# Patient Record
Sex: Female | Born: 1960 | Race: White | Hispanic: No | Marital: Married | Smoking: Smoker, current status unknown
Health system: Southern US, Community
[De-identification: ages and names within clinical notes are randomized; demographics above are authoritative.]

## PROBLEM LIST (undated history)

## (undated) ENCOUNTER — Encounter (HOSPITAL_COMMUNITY)
Admission: EM | Disposition: A | Discharge status: Skilled Nursing Facility | Payer: Self-pay | Source: Ambulatory Visit | Attending: Anesthesiology

## (undated) DIAGNOSIS — Z8614 Personal history of Methicillin resistant Staphylococcus aureus infection: Secondary | ICD-10-CM

## (undated) DIAGNOSIS — R0600 Dyspnea, unspecified: Secondary | ICD-10-CM

## (undated) DIAGNOSIS — M129 Arthropathy, unspecified: Secondary | ICD-10-CM

## (undated) DIAGNOSIS — K219 Gastro-esophageal reflux disease without esophagitis: Secondary | ICD-10-CM

## (undated) DIAGNOSIS — R002 Palpitations: Secondary | ICD-10-CM

## (undated) DIAGNOSIS — R0602 Shortness of breath: Secondary | ICD-10-CM

## (undated) DIAGNOSIS — J9690 Respiratory failure, unspecified, unspecified whether with hypoxia or hypercapnia: Secondary | ICD-10-CM

## (undated) DIAGNOSIS — F419 Anxiety disorder, unspecified: Secondary | ICD-10-CM

## (undated) DIAGNOSIS — Z973 Presence of spectacles and contact lenses: Secondary | ICD-10-CM

## (undated) DIAGNOSIS — I10 Essential (primary) hypertension: Secondary | ICD-10-CM

## (undated) DIAGNOSIS — C801 Malignant (primary) neoplasm, unspecified: Secondary | ICD-10-CM

## (undated) DIAGNOSIS — R0609 Other forms of dyspnea: Secondary | ICD-10-CM

## (undated) DIAGNOSIS — R011 Cardiac murmur, unspecified: Secondary | ICD-10-CM

## (undated) DIAGNOSIS — E785 Hyperlipidemia, unspecified: Secondary | ICD-10-CM

## (undated) DIAGNOSIS — M6281 Muscle weakness (generalized): Secondary | ICD-10-CM

## (undated) DIAGNOSIS — N390 Urinary tract infection, site not specified: Secondary | ICD-10-CM

## (undated) DIAGNOSIS — B019 Varicella without complication: Secondary | ICD-10-CM

## (undated) DIAGNOSIS — G43909 Migraine, unspecified, not intractable, without status migrainosus: Secondary | ICD-10-CM

## (undated) DIAGNOSIS — J309 Allergic rhinitis, unspecified: Secondary | ICD-10-CM

## (undated) HISTORY — DX: Allergic rhinitis, unspecified: J30.9

## (undated) HISTORY — DX: Varicella without complication: B01.9

## (undated) HISTORY — DX: Gastro-esophageal reflux disease without esophagitis: K21.9

## (undated) HISTORY — DX: Urinary tract infection, site not specified: N39.0

## (undated) HISTORY — DX: Essential (primary) hypertension: I10

## (undated) HISTORY — DX: Migraine, unspecified, not intractable, without status migrainosus: G43.909

## (undated) HISTORY — PX: LOWER LEG SOFT TISSUE TUMOR EXCISION: SUR553

## (undated) HISTORY — PX: HX BREAST AUGMENTATION: SHX7

## (undated) HISTORY — PX: HX LAP CHOLECYSTECTOMY: SHX56

## (undated) HISTORY — PX: HX APPENDECTOMY: SHX54

## (undated) HISTORY — PX: HX OTHER: 2100001105

## (undated) HISTORY — PX: TRACHEOSTOMY: SUR1362

## (undated) HISTORY — PX: HX TUBAL LIGATION: SHX77

## (undated) SURGERY — IR SPINAL PUNCTURE LUMBAR DIAGNOSTIC SAMPLING

---

## 1979-07-25 HISTORY — PX: APPENDECTOMY: SHX54

## 1982-07-24 HISTORY — PX: CHOLECYSTECTOMY: SHX55

## 1988-07-24 HISTORY — PX: EXPLORATORY LAPAROTOMY: SUR591

## 1995-07-25 HISTORY — PX: TUBAL LIGATION: SHX77

## 1997-07-24 HISTORY — PX: LUMBAR DISC SURGERY: SHX700

## 1997-07-24 HISTORY — PX: HX LUMBAR DISKECTOMY: SHX99

## 1997-10-28 ENCOUNTER — Ambulatory Visit (HOSPITAL_COMMUNITY): Admission: RE | Admit: 1997-10-28 | Discharge: 1997-10-28 | Payer: Self-pay | Admitting: Neurological Surgery

## 1997-11-09 ENCOUNTER — Ambulatory Visit (HOSPITAL_COMMUNITY): Admission: RE | Admit: 1997-11-09 | Discharge: 1997-11-09 | Payer: Self-pay | Admitting: Neurological Surgery

## 1997-11-23 ENCOUNTER — Ambulatory Visit (HOSPITAL_COMMUNITY): Admission: RE | Admit: 1997-11-23 | Discharge: 1997-11-23 | Payer: Self-pay | Admitting: Neurological Surgery

## 1997-12-27 ENCOUNTER — Ambulatory Visit (HOSPITAL_COMMUNITY): Admission: RE | Admit: 1997-12-27 | Discharge: 1997-12-27 | Payer: Self-pay | Admitting: Neurological Surgery

## 1998-01-12 ENCOUNTER — Ambulatory Visit (HOSPITAL_COMMUNITY): Admission: RE | Admit: 1998-01-12 | Discharge: 1998-01-13 | Payer: Self-pay | Admitting: Neurological Surgery

## 1998-03-30 ENCOUNTER — Encounter: Admission: RE | Admit: 1998-03-30 | Discharge: 1998-06-28 | Payer: Self-pay | Admitting: Neurological Surgery

## 1998-04-06 ENCOUNTER — Other Ambulatory Visit: Admission: RE | Admit: 1998-04-06 | Discharge: 1998-04-06 | Payer: Self-pay | Admitting: *Deleted

## 1998-04-29 ENCOUNTER — Other Ambulatory Visit: Admission: RE | Admit: 1998-04-29 | Discharge: 1998-04-29 | Payer: Self-pay | Admitting: *Deleted

## 1998-11-03 ENCOUNTER — Other Ambulatory Visit: Admission: RE | Admit: 1998-11-03 | Discharge: 1998-11-03 | Payer: Self-pay | Admitting: *Deleted

## 1999-08-17 ENCOUNTER — Other Ambulatory Visit: Admission: RE | Admit: 1999-08-17 | Discharge: 1999-08-17 | Payer: Self-pay | Admitting: *Deleted

## 1999-12-16 ENCOUNTER — Encounter: Admission: RE | Admit: 1999-12-16 | Discharge: 1999-12-16 | Payer: Self-pay | Admitting: *Deleted

## 1999-12-16 ENCOUNTER — Encounter: Payer: Self-pay | Admitting: *Deleted

## 2000-02-02 ENCOUNTER — Other Ambulatory Visit: Admission: RE | Admit: 2000-02-02 | Discharge: 2000-02-02 | Payer: Self-pay | Admitting: *Deleted

## 2000-10-04 ENCOUNTER — Other Ambulatory Visit: Admission: RE | Admit: 2000-10-04 | Discharge: 2000-10-04 | Payer: Self-pay | Admitting: *Deleted

## 2001-09-26 ENCOUNTER — Encounter: Payer: Self-pay | Admitting: *Deleted

## 2001-09-26 ENCOUNTER — Encounter: Admission: RE | Admit: 2001-09-26 | Discharge: 2001-09-26 | Payer: Self-pay | Admitting: *Deleted

## 2001-10-23 ENCOUNTER — Other Ambulatory Visit: Admission: RE | Admit: 2001-10-23 | Discharge: 2001-10-23 | Payer: Self-pay | Admitting: *Deleted

## 2003-10-09 ENCOUNTER — Other Ambulatory Visit: Admission: RE | Admit: 2003-10-09 | Discharge: 2003-10-09 | Payer: Self-pay | Admitting: Obstetrics and Gynecology

## 2003-10-21 ENCOUNTER — Encounter (INDEPENDENT_AMBULATORY_CARE_PROVIDER_SITE_OTHER): Payer: Self-pay | Admitting: Specialist

## 2003-10-21 ENCOUNTER — Ambulatory Visit (HOSPITAL_COMMUNITY): Admission: RE | Admit: 2003-10-21 | Discharge: 2003-10-21 | Payer: Self-pay | Admitting: *Deleted

## 2004-04-27 ENCOUNTER — Ambulatory Visit: Payer: Self-pay | Admitting: Pain Medicine

## 2004-05-05 ENCOUNTER — Ambulatory Visit: Payer: Self-pay | Admitting: Pain Medicine

## 2004-05-19 ENCOUNTER — Ambulatory Visit: Payer: Self-pay | Admitting: Physician Assistant

## 2004-06-14 ENCOUNTER — Ambulatory Visit: Payer: Self-pay | Admitting: Pain Medicine

## 2004-07-21 ENCOUNTER — Ambulatory Visit: Payer: Self-pay | Admitting: Physician Assistant

## 2004-09-14 ENCOUNTER — Ambulatory Visit: Payer: Self-pay | Admitting: Physician Assistant

## 2004-10-11 ENCOUNTER — Ambulatory Visit: Payer: Self-pay | Admitting: Physician Assistant

## 2004-11-09 ENCOUNTER — Ambulatory Visit: Payer: Self-pay | Admitting: Physician Assistant

## 2004-12-07 ENCOUNTER — Ambulatory Visit: Payer: Self-pay | Admitting: Physician Assistant

## 2005-01-02 ENCOUNTER — Ambulatory Visit: Payer: Self-pay | Admitting: Physician Assistant

## 2005-01-05 ENCOUNTER — Encounter: Admission: RE | Admit: 2005-01-05 | Discharge: 2005-01-05 | Payer: Self-pay | Admitting: Occupational Medicine

## 2005-02-01 ENCOUNTER — Ambulatory Visit: Payer: Self-pay | Admitting: Physician Assistant

## 2005-03-16 ENCOUNTER — Ambulatory Visit: Payer: Self-pay | Admitting: Physician Assistant

## 2005-03-31 ENCOUNTER — Ambulatory Visit: Payer: Self-pay | Admitting: Physician Assistant

## 2005-04-27 ENCOUNTER — Ambulatory Visit: Payer: Self-pay | Admitting: Physician Assistant

## 2005-05-29 ENCOUNTER — Ambulatory Visit: Payer: Self-pay | Admitting: Physician Assistant

## 2005-06-20 ENCOUNTER — Ambulatory Visit: Payer: Self-pay | Admitting: Physician Assistant

## 2005-06-27 ENCOUNTER — Ambulatory Visit: Payer: Self-pay | Admitting: Pain Medicine

## 2005-06-28 ENCOUNTER — Ambulatory Visit: Payer: Self-pay | Admitting: Pain Medicine

## 2005-07-26 ENCOUNTER — Ambulatory Visit: Payer: Self-pay | Admitting: Physician Assistant

## 2005-08-24 ENCOUNTER — Ambulatory Visit: Payer: Self-pay | Admitting: Physician Assistant

## 2005-09-26 ENCOUNTER — Ambulatory Visit: Payer: Self-pay | Admitting: Physician Assistant

## 2005-10-19 ENCOUNTER — Ambulatory Visit: Payer: Self-pay | Admitting: Physician Assistant

## 2006-01-31 ENCOUNTER — Ambulatory Visit: Payer: Self-pay | Admitting: Physician Assistant

## 2006-07-25 ENCOUNTER — Ambulatory Visit: Payer: Self-pay | Admitting: Pain Medicine

## 2006-10-17 ENCOUNTER — Ambulatory Visit: Payer: Self-pay | Admitting: Physician Assistant

## 2007-01-03 ENCOUNTER — Ambulatory Visit: Payer: Self-pay | Admitting: Family Medicine

## 2007-01-03 DIAGNOSIS — M545 Low back pain: Secondary | ICD-10-CM

## 2007-01-09 ENCOUNTER — Ambulatory Visit: Payer: Self-pay | Admitting: Physician Assistant

## 2007-05-29 ENCOUNTER — Ambulatory Visit: Payer: Self-pay | Admitting: Physician Assistant

## 2010-12-09 NOTE — H&P (Signed)
NAME:  Valerie Forbes, Valerie Forbes                       ACCOUNT NO.:  [id number]   MEDICAL RECORD NO.:  [id number]                   PATIENT TYPE:  AMB   LOCATION:  [facility]C                                  FACILITY:  [facility]   PHYSICIAN:  [place] B. Earlene Plater, M.D.               DATE OF BIRTH:  13-Nov-1960   DATE OF ADMISSION:  10/21/2003  DATE OF DISCHARGE:                                HISTORY & PHYSICAL   PREOPERATIVE DIAGNOSES:  1. Abdominal bleeding.  2. Endometrial polyp.   INTENDED PROCEDURES:  1. Hysteroscopy.  2. Dilatation and curettage.  3. Polyp removal.   HISTORY OF PRESENT ILLNESS:  This is a 50 year old white female, gravida 4,  para 4, with a history of irregular bleeding, at times daily.  Pelvic  ultrasound and subsequent saline infusion ultrasound suggestive of  endometrial polyp.  Presents for surgical diagnosis and treatment.   PAST MEDICAL HISTORY:  Hypertension.   PAST SURGICAL HISTORY:  1. Breast implants.  2. Tubal ligation.   MEDICATIONS:  Lexapro, Norvasc, Klonopin, Robaxin, Neurontin, and Vicodin.   ALLERGIES:  CODEINE.   SOCIAL HISTORY:  The patient does smoke.  No alcohol or other drugs.   FAMILY HISTORY:  Noncontributory.   REVIEW OF SYSTEMS:  Otherwise negative.   PHYSICAL EXAMINATION:  VITAL SIGNS:  Blood pressure 150/100, pulse 76.  WEIGHT:  137 pounds.  GENERAL APPEARANCE:  Alert and oriented.  In no acute distress.  SKIN:  Warm and dry.  No lesions.  HEART:  Regular rate and rhythm.  LUNGS:  Clear to auscultation.  PELVIC:  Normal external genitalia.  Vagina and cervix normal.  The uterus  is normal size and anteverted.   ASSESSMENT:  Irregular bleeding with sonohysterogram consistent with  endometrial polyp.   PLAN:  1. Hysteroscopy.  2. Dilatation and curettage.  3. Polyp removal.   The operative risks were discussed, including infection, bleeding, uterine  perforation, damage to surrounding organs, and fluid overload.  All  questions were  answered.  The patient wished to proceed.                                               Gerri Spore B. Earlene Plater, M.D.    WBD/MEDQ  D:  10/20/2003  T:  10/20/2003  Job:  [id number]

## 2010-12-09 NOTE — Op Note (Signed)
NAME:  Valerie Forbes, [facility]UHN                       ACCOUNT NO.:  [id number]   MEDICAL RECORD NO.:  [id number]                   PATIENT TYPE:  AMB   LOCATION:  [facility]C                                  FACILITY:  [facility]   PHYSICIAN:  [place] B. Earlene Plater, M.D.               DATE OF BIRTH:  July 18, 1961   DATE OF PROCEDURE:  10/21/2003  DATE OF DISCHARGE:                                 OPERATIVE REPORT   PREOPERATIVE DIAGNOSIS:  Abnormal uterine bleeding and endometrial polyp.   POSTOPERATIVE DIAGNOSIS:  Abnormal uterine bleeding and endometrial polyp.   PROCEDURE:  Hysteroscopy, dilatation and curettage, polyp removal.   SURGEON:  [place] B. Earlene Plater, M.D.   ANESTHESIA:  LMA.   FINDINGS:  Endometrial polyp arises from the anterior wall of the uterus and  shaggy endometrium detected around the left cornu.  Normal-appearing tubal  ostia.   ESTIMATED BLOOD LOSS:  50 mL.   FLUID DEFICIT:  10 mL sorbitol.   INDICATIONS:  Patient with the above issues, for surgical diagnosis and  management.   PROCEDURE:  The patient was taken to the operating room and LMA anesthesia  obtained.  She was placed in the [place] stirrups, prepped and draped in a  standard fashion.  Bladder emptied with the red rubber catheter.  Exam under  anesthesia showed an anteverted, normal-size uterus and no adnexal masses.   Speculum inserted and cervix dilated to a #21 without difficulty.  The  hysteroscope was inserted after being flushed with sorbitol.  With good  uterine distention, the above findings were noted.   The polyp was removed with Randall stone forceps.  The endometrium was  gently curetted until a gritty texture noted throughout.  The hysteroscope  was replaced and no other abnormalities noted.  Therefore, the procedure was  terminated.   The patient tolerated the procedure well and had no complications.  She was  taken to the recovery room awake, alert, in stable condition.              Gerri Spore B. Earlene Plater, M.D.    WBD/MEDQ  D:  10/21/2003  T:  10/21/2003  Job:  [id number]

## 2015-05-14 ENCOUNTER — Ambulatory Visit (INDEPENDENT_AMBULATORY_CARE_PROVIDER_SITE_OTHER): Payer: PRIVATE HEALTH INSURANCE | Admitting: Family Medicine

## 2015-05-14 ENCOUNTER — Encounter: Payer: Self-pay | Admitting: Family Medicine

## 2015-05-14 ENCOUNTER — Telehealth: Payer: Self-pay | Admitting: Family Medicine

## 2015-05-14 VITALS — BP 128/88 | HR 75 | Temp 98.3°F | Ht 66.34 in | Wt 163.8 lb

## 2015-05-14 DIAGNOSIS — R0602 Shortness of breath: Secondary | ICD-10-CM | POA: Insufficient documentation

## 2015-05-14 DIAGNOSIS — I10 Essential (primary) hypertension: Secondary | ICD-10-CM

## 2015-05-14 DIAGNOSIS — M79642 Pain in left hand: Secondary | ICD-10-CM

## 2015-05-14 DIAGNOSIS — R079 Chest pain, unspecified: Secondary | ICD-10-CM

## 2015-05-14 DIAGNOSIS — M79641 Pain in right hand: Secondary | ICD-10-CM

## 2015-05-14 LAB — COMPREHENSIVE METABOLIC PANEL
ALBUMIN: 4 g/dL (ref 3.5–5.2)
ALT: 8 U/L (ref 0–35)
AST: 14 U/L (ref 0–37)
Alkaline Phosphatase: 60 U/L (ref 39–117)
BILIRUBIN TOTAL: 0.3 mg/dL (ref 0.2–1.2)
BUN: 17 mg/dL (ref 6–23)
CALCIUM: 9.6 mg/dL (ref 8.4–10.5)
CO2: 29 mEq/L (ref 19–32)
CREATININE: 0.67 mg/dL (ref 0.40–1.20)
Chloride: 106 mEq/L (ref 96–112)
GFR: 96.71 mL/min (ref >60.00)
Glucose, Bld: 91 mg/dL (ref 70–99)
Potassium: 4.2 mEq/L (ref 3.5–5.1)
Sodium: 139 mEq/L (ref 135–145)
Total Protein: 6.8 g/dL (ref 6.0–8.3)

## 2015-05-14 LAB — CBC
HCT: 44.1 % (ref 36.0–46.0)
Hemoglobin: 14.8 g/dL (ref 12.0–15.0)
MCHC: 33.5 g/dL (ref 30.0–36.0)
MCV: 89 fl (ref 78.0–100.0)
Platelets: 260 10*3/uL (ref 150.0–400.0)
RBC: 4.95 Mil/uL (ref 3.87–5.11)
RDW: 13.9 % (ref 11.5–15.5)
WBC: 9.3 10*3/uL (ref 4.0–10.5)

## 2015-05-14 LAB — TROPONIN I: TNIDX: 0.01 ug/L (ref 0.00–0.06)

## 2015-05-14 LAB — TSH: TSH: 0.31 u[IU]/mL — AB (ref 0.35–4.50)

## 2015-05-14 LAB — HEMOGLOBIN A1C: HEMOGLOBIN A1C: 5.2 % (ref 4.6–6.5)

## 2015-05-14 MED ORDER — TRAMADOL HCL 50 MG PO TABS: 50.0000 mg | ORAL_TABLET | Freq: Two times a day (BID) | ORAL | Status: DC | PRN

## 2015-05-14 MED ORDER — METOPROLOL TARTRATE 25 MG PO TABS: 25.0000 mg | ORAL_TABLET | Freq: Two times a day (BID) | ORAL | Status: DC

## 2015-05-14 MED ORDER — AMLODIPINE BESYLATE 5 MG PO TABS: 5.0000 mg | ORAL_TABLET | Freq: Every day | ORAL | Status: DC

## 2015-05-14 NOTE — Assessment & Plan Note (Addendum)
Patient with long history of bilateral hand pain in her finger joints. She has no apparent defects on exam today. She has been on tramadol for this for some time. This was previously prescribed by her prior PCP. She has recently been taking Aleve for this. Given that she is starting on a daily aspirin I would prefer she avoid NSAID use at this time. We will refill her tramadol for a 10 day supply and request records from her PCP. If this continues to be an issue in the future would consider imaging of her hands and/or arthritic workup.

## 2015-05-14 NOTE — Progress Notes (Signed)
Pre visit review using our clinic review tool, if applicable. No additional management support is needed unless otherwise documented below in the visit note. 

## 2015-05-14 NOTE — Assessment & Plan Note (Signed)
Patient is at goal today. We will refill her amlodipine and metoprolol. Given that she is at goal today and her blood pressure has been under improved control at home recently we will refrain from prescribing lisinopril and HCTZ at this time. She will continue to monitor her blood pressure and if it is greater than 150/90 she will let us know immediately. If she continues to run in the 140s systolic with these medications will need to add another medicine for her blood pressure. We'll check lab work today.

## 2015-05-14 NOTE — Patient Instructions (Signed)
Nice to meet you. I have advised that you go to the ED for evaluation. You have chosen to monitor this. If you develop further chest tightness or shortness of breath or fatigue please seek medical attention immediately.  We will check blood work to evaluate for a cause of your pain.  We have refilled your medications.  If you develop chest pain, shortness of breath, fatigue, light headedness, palpitations, fever, feel poorly, or have any change in symptoms or new symptoms please seek medical attention.

## 2015-05-14 NOTE — Progress Notes (Addendum)
Patient ID: Valerie Forbes, female   DOB: [birth year], 54 y.o.   MRN: [id number]  Valerie Rumps, MD Phone: [phone]  Valerie Forbes is a 54 y.o. female who presents today for new patient visit.  HYPERTENSION Disease Monitoring Home BP Monitoring checking at home, recently has been 140-146/82-102 Chest pain- yes, see below    Dyspnea- yes, see below Medications Compliance-  taking metoprolol 25 mg twice a day and amlodipine 5 mg twice a day. Lightheadedness-  no  Edema- no  Exertional chest pain and fatigue: Patient notes for the last 1.5 weeks she has had fatigue on exertion. She notes mild central chest tightness with this. She notes she has gotten winded going up stairs. She denies shortness of breath or chest discomfort at rest. She's not had any diaphoresis or radiation of the pain. She is not wheezing during this time either. She does note decreased stamina. She notes she has been working longer hours than usual. She works 8 AM to 11 PM. She denies depression, SI, and HI. She does not have a history of any thyroid issues. She denies bleeding from anywhere. Her last colonoscopy was in the early 1990s. Her last Pap smear was around the year 2007. She does note infrequent palpitations with this that she describes as her heart racing. She notes she checks it at home and it's near 100 prior to taking the metoprolol. Will come down into the 60s after she takes metoprolol. She does not typically have chest pain or shortness of breath with the palpitations. She notes prior to the last week and a half she felt well. He is unsure if this fatigue is related to her working longer hours. She's not had any fevers or unexplained weight loss. She has no URI symptoms. She does not have any current or recent headaches. She has been more stressed out with her increased work hours. He does note some weight gain. She notes her last episode of discomfort was today around 11:30 to 12:00 when she is moving  a TV with her daughter. She has no chest pain or shortness of breath at this time.  Hand pain: Patient notes pain in bilateral hands in the PIP and DIP joints for years. She notes it is slowly worsened. They intermittently swell. She does not remember having imaging or lab workup for arthritis. She notes they do not bother her very much at this time though they have in the past several weeks. Note she has taken Aleve since she ran out of her tramadol. Her PCP was prescribing her tramadol previously.   Active Ambulatory Problems    Diagnosis Date Noted  . Exertional chest pain 05/14/2015  . Essential hypertension 05/14/2015  . Bilateral hand pain 05/14/2015   Resolved Ambulatory Problems    Diagnosis Date Noted  . LOW BACK PAIN, ACUTE 01/03/2007   Past Medical History  Diagnosis Date  . Chickenpox   . GERD (gastroesophageal reflux disease)   . Allergic rhinitis   . Hypertension   . Migraines   . UTI (lower urinary tract infection)     Family History  Problem Relation Age of Onset  . Arthritis      Parent   . Hyperlipidemia      Parent  . Heart disease      Parent  . Hypertension      Parent  . Non-Hodgkin's lymphoma      Parent    Social History   Social History  . Marital Status:  Married    Spouse Name: N/A  . Number of Children: N/A  . Years of Education: N/A   Occupational History  . Not on file.   Social History Main Topics  . Smoking status: Smoker, Current Status Unknown  . Smokeless tobacco: Not on file  . Alcohol Use: Not on file  . Drug Use: Not on file  . Sexual Activity: Not on file   Other Topics Concern  . Not on file   Social History Narrative  . No narrative on file    ROS   General:  Negative for unexplained weight loss, fever Skin: Negative for new or changing mole, sore that won't heal HEENT: Negative for trouble hearing, trouble seeing, ringing in ears, mouth sores, hoarseness, change in voice, dysphagia. CV:  Negative for chest  pain, dyspnea, edema, positive for palpitations Resp: Negative for cough, dyspnea, hemoptysis GI: Positive for chronic diarrhea, Negative for nausea, vomiting, constipation, abdominal pain, melena, hematochezia. GU: Negative for dysuria, incontinence, urinary hesitance, hematuria, vaginal or penile discharge, polyuria, sexual difficulty, lumps in testicle or breasts MSK: Positive for joint pain in hands, Negative for muscle cramps or aches, joint swelling Neuro: Positive for headaches, Negative for weakness, numbness, dizziness, passing out/fainting Psych: Negative for depression, anxiety, memory problems  Objective  Physical Exam Filed Vitals:   05/14/15 1311  BP: 128/88  Pulse: 75  Temp: 98.3 F (36.8 C)    Physical Exam  Constitutional: She is well-developed, well-nourished, and in no distress.  HENT:  Head: Normocephalic and atraumatic.  Right Ear: External ear normal.  Left Ear: External ear normal.  Mouth/Throat: Oropharynx is clear and moist.  Eyes: Conjunctivae are normal. Pupils are equal, round, and reactive to light.  Neck: Neck supple.  Cardiovascular: Normal rate and regular rhythm.  Exam reveals no gallop and no friction rub.   Murmur (2/6 systolic murmur) heard. Pulmonary/Chest: Effort normal and breath sounds normal. No respiratory distress. She has no wheezes. She has no rales.  Abdominal: Soft. Bowel sounds are normal. She exhibits no distension. There is no tenderness. There is no rebound and no guarding.  Musculoskeletal: She exhibits no edema.  Bilateral hands with no swelling, joint tenderness, erythema, both hands are warm and well perfused with good capillary refill, 2+ radial pulses, full range of motion all 10 fingers  Lymphadenopathy:    She has no cervical adenopathy.  Neurological: She is alert.  CN 2-12 intact, 5/5 strength in bilateral biceps, triceps, grip, quads, hamstrings, plantar and dorsiflexion, sensation to light touch intact in bilateral UE  and LE, normal gait, 2+ patellar reflexes  Skin: She is not diaphoretic.   EKG: Sinus rhythm with an occasional PAC, rate 66, flattened T-wave in lead II, inverted T waves in leads III, aVF, V5, and V6  Assessment/Plan:   Exertional chest pain Patient with what sounds like typical chest pain on exertion. She has no chest pain or shortness of breath at this time. Her EKG does show inferior and lateral inverted T waves. Her last episode of chest pain was today around 11:30 to 12:00. Given the recent episode of chest pain and her EKG findings I advised her that she should be evaluated in the emergency room at this time. She declined this opting to monitor this at home. Given that she refuses to go to the emergency room we will check a troponin in the office. We will get her scheduled with cardiology for evaluation. She will start on aspirin 81 mg daily. We will  check TSH, cmet, CBC, and A1c. Also noted murmur on exam and patient notes she has had a murmur previously, though does not remember the last time she was advised that this was heard. She will benefit from evaluation of this with cardiology. She signed AMA paperwork after discussion of going to the emergency room. She understands the risks of not being evaluated for this at this time. She was given return precautions and advised to seek medical attention immediately in the emergency room if she had recurrence of symptoms.  Essential hypertension Patient is at goal today. We will refill her amlodipine and metoprolol. Given that she is at goal today and her blood pressure has been under improved control at home recently we will refrain from prescribing lisinopril and HCTZ at this time. She will continue to monitor her blood pressure and if it is greater than 150/90 she will let us know immediately. If she continues to run in the 007M systolic with these medications will need to add another medicine for her blood pressure. We'll check lab work  today.  Bilateral hand pain Patient with long history of bilateral hand pain in her finger joints. She has no apparent defects on exam today. She has been on tramadol for this for some time. This was previously prescribed by her prior PCP. She has recently been taking Aleve for this. Given that she is starting on a daily aspirin I would prefer she avoid NSAID use at this time. We will refill her tramadol for a 10 day supply and request records from her PCP. If this continues to be an issue in the future would consider imaging of her hands and/or arthritic workup.    Orders Placed This Encounter  Procedures  . Comp Met (CMET)  . CBC  . TSH  . HgB A1c  . Troponin I  . Ambulatory referral to Cardiology    Referral Priority:  Urgent    Referral Type:  Consultation    Referral Reason:  Specialty Services Required    Requested Specialty:  Cardiology    Number of Visits Requested:  1  . EKG 12-Lead    Valerie Forbes

## 2015-05-14 NOTE — Telephone Encounter (Signed)
Attempted to call patient to inform her that her troponin was negative. There is no answer on her mobile phone and I left a message asking her to call back to the office when she got the message. Attempted to called the home phone number listed and was advised that I had the wrong number. We will await the patient's call back.

## 2015-05-14 NOTE — Telephone Encounter (Signed)
Notified patient of lab results.  Patient verbalized understanding.  

## 2015-05-14 NOTE — Assessment & Plan Note (Addendum)
Patient with what sounds like typical chest pain on exertion. She has no chest pain or shortness of breath at this time. Her EKG does show inferior and lateral inverted T waves. Her last episode of chest pain was today around 11:30 to 12:00. Given the recent episode of chest pain and her EKG findings I advised her that she should be evaluated in the emergency room at this time. She declined this opting to monitor this at home. Given that she refuses to go to the emergency room we will check a troponin in the office. We will get her scheduled with cardiology for evaluation. She will start on aspirin 81 mg daily. We will check TSH, cmet, CBC, and A1c. Also noted murmur on exam and patient notes she has had a murmur previously, though does not remember the last time she was advised that this was heard. She will benefit from evaluation of this with cardiology. She signed AMA paperwork after discussion of going to the emergency room. She understands the risks of not being evaluated for this at this time. She was given return precautions and advised to seek medical attention immediately in the emergency room if she had recurrence of symptoms.

## 2015-05-19 ENCOUNTER — Telehealth: Payer: Self-pay | Admitting: *Deleted

## 2015-05-19 ENCOUNTER — Other Ambulatory Visit (INDEPENDENT_AMBULATORY_CARE_PROVIDER_SITE_OTHER): Payer: PRIVATE HEALTH INSURANCE

## 2015-05-19 DIAGNOSIS — R946 Abnormal results of thyroid function studies: Secondary | ICD-10-CM

## 2015-05-19 DIAGNOSIS — R7989 Other specified abnormal findings of blood chemistry: Secondary | ICD-10-CM

## 2015-05-19 LAB — T3, FREE: T3 FREE: 4.6 pg/mL — AB (ref 2.3–4.2)

## 2015-05-19 LAB — T4, FREE: Free T4: 0.98 ng/dL (ref 0.60–1.60)

## 2015-05-19 NOTE — Telephone Encounter (Signed)
I apologize for not placing these sooner, orders have been placed with the appropriate diagnosis code.

## 2015-05-19 NOTE — Telephone Encounter (Signed)
Labs and dx?  

## 2015-05-28 ENCOUNTER — Encounter: Payer: Self-pay | Admitting: Family Medicine

## 2015-05-28 ENCOUNTER — Ambulatory Visit (INDEPENDENT_AMBULATORY_CARE_PROVIDER_SITE_OTHER): Payer: No Typology Code available for payment source | Admitting: Family Medicine

## 2015-05-28 VITALS — BP 124/82 | HR 66 | Temp 98.0°F | Ht 66.43 in | Wt 165.6 lb

## 2015-05-28 DIAGNOSIS — M79642 Pain in left hand: Secondary | ICD-10-CM

## 2015-05-28 DIAGNOSIS — M79641 Pain in right hand: Secondary | ICD-10-CM

## 2015-05-28 DIAGNOSIS — R7989 Other specified abnormal findings of blood chemistry: Secondary | ICD-10-CM | POA: Insufficient documentation

## 2015-05-28 DIAGNOSIS — R079 Chest pain, unspecified: Secondary | ICD-10-CM

## 2015-05-28 DIAGNOSIS — M2550 Pain in unspecified joint: Secondary | ICD-10-CM | POA: Diagnosis not present

## 2015-05-28 DIAGNOSIS — I10 Essential (primary) hypertension: Secondary | ICD-10-CM | POA: Diagnosis not present

## 2015-05-28 DIAGNOSIS — R946 Abnormal results of thyroid function studies: Secondary | ICD-10-CM | POA: Diagnosis not present

## 2015-05-28 DIAGNOSIS — M542 Cervicalgia: Secondary | ICD-10-CM | POA: Insufficient documentation

## 2015-05-28 LAB — T3: T3 TOTAL: 119.4 ng/dL (ref 80.0–204.0)

## 2015-05-28 LAB — SEDIMENTATION RATE: SED RATE: 9 mm/h (ref 0–22)

## 2015-05-28 LAB — TSH: TSH: 0.61 u[IU]/mL (ref 0.35–4.50)

## 2015-05-28 LAB — RHEUMATOID FACTOR

## 2015-05-28 LAB — T4, FREE: FREE T4: 1.02 ng/dL (ref 0.60–1.60)

## 2015-05-28 MED ORDER — TRAMADOL HCL 50 MG PO TABS: 50.0000 mg | ORAL_TABLET | Freq: Two times a day (BID) | ORAL | Status: DC | PRN

## 2015-05-28 NOTE — Progress Notes (Signed)
Patient ID: RHYTHM GUBBELS, female   DOB: 1961/06/13, 54 y.o.   MRN: [id number]  [name], MD Phone: [phone]  Valerie Forbes is a 54 y.o. female who presents today for follow-up.  HYPERTENSION Disease Monitoring Home BP Monitoring is checking at home, states it's been in the 120s over 80s Chest pain- yes see below    Dyspnea- yes see below Medications Compliance-  taking metoprolol 25 mg twice daily and amlodipine 5 mg once daily.  Edema- no  Chest pain/shortness of breath, exertional: Patient notes continued intermittent chest discomfort described as indigestion that she gets when she walks. She also notes some exertional shortness of breath as well. She notes if she walks any decent distance she has these symptoms. This resolves with rest. She has no chest pain or shortness of breath right now. She's been taking aspirin. She has no diaphoresis with this. She does note she has seen cardiology and they're planning a stress echo and abdominal ultrasound to evaluate for AAA.  Low TSH: On screening lab work at her last visit that revealed a low TSH and a minimally high free T3. She is here to get repeat lab work on this. She does note intermittent palpitations. She does note heat intolerance. She tolerates cold weather better. She's not had any skin changes. She has gained some weight. No known thyroid issues in the past.  Left neck soreness: Patient notes intermittently for a number of years she has had left anterior neck soreness. She had one episode where this area became swollen and tender about a year ago. She had no other symptoms with this. She had no injury to her neck. She had no difficulty swallowing. She does note some intermittent soreness with swallowing though she is able to swallow easily. She's had no throat swelling, tongue swelling or, lip swelling with this. She has no mass right now.  Hand aches: Patient does note that her bilateral hands ache in the joints.  She notes a few little nodules on several of her DIP joints. She notes it's mostly in her DIP and PIP joints. She notes it especially worse in the morning and at night. It is to the point that it hurts for her to pick things up and hold them. She believes she has arthritis.   PMH: smoker.   ROS see history of present illness  Objective  Physical Exam Filed Vitals:   05/28/15 1431  BP: 124/82  Pulse: 66  Temp: 98 F (36.7 C)   Physical Exam  Constitutional: She is well-developed, well-nourished, and in no distress.  HENT:  Head: Normocephalic and atraumatic.  Mouth/Throat: Oropharynx is clear and moist. No oropharyngeal exudate.  No masses or erythema or exudate noted in the throat, tonsils are equal and normal in size  Neck: Neck supple. No thyromegaly present.  No mass noted in bilateral anterior neck, there is minimal soreness in the left anterior cervical lymph node chain though no palpable lymph nodes  Cardiovascular: Normal rate and regular rhythm.  Exam reveals no gallop and no friction rub.   Murmur (2/6 systolic murmur) heard. Pulmonary/Chest: Effort normal and breath sounds normal. No respiratory distress. She has no wheezes. She has no rales.  Musculoskeletal: She exhibits no edema.  Bilateral hands with scattered DIP joint nodules there is no joint swelling, there is no joint erythema, there is no joint tenderness  Lymphadenopathy:    She has no cervical adenopathy.  Neurological:  5/5 strength in bilateral biceps, triceps, grip,  quads, hamstrings, plantar and dorsiflexion, sensation to light touch intact in bilateral UE and LE, normal gait, 2+ patellar reflexes  Skin: Skin is warm and dry. She is not diaphoretic.     Assessment/Plan: Please see individual problem list.  Bilateral hand pain Long history of bilateral hand pain in her finger joints. Note there are what appear to be small joint nodules on the DIP joints. Discussed potential imaging versus lab workup  for arthritic component. Patient opted for lab workup at this time. We will order rheumatoid factor, ANA, and ESR. These are negative would consider x-rays of the hands. We'll refill her tramadol.  Essential hypertension At goal. Continue current medication regimen.  Exertional chest pain No active chest pain today. Has continued to have intermittent chest pain and has followed up with cardiology already with the plan for stress echo and abdominal ultrasound to evaluate for AAA. She'll continue aspirin daily. We'll continue to monitor this at this time and if she developed chest pain or shortness of breath or palpitations or diaphoresis she will seek medical attention. Given return precautions  Low TSH level Recheck labs today.  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 There is no mass in this area. This is been going on for a number of years. No swallowing difficulties. No throat swelling or mouth issues with this. Do not feel a defect on exam today. I did discuss imaging to evaluate this further, though the patient declined this at this time and opted to continue to monitor. We will continue to monitor this. She is given return precautions.    Orders Placed This Encounter  Procedures  . TSH  . T3  . T4, free  . Rheumatoid Factor  . Antinuclear Antib (ANA)  . Sed Rate (ESR)    Meds ordered this encounter  Medications  . traMADol (ULTRAM) 50 MG tablet    Sig: Take 1 tablet (50 mg total) by mouth every 12 (twelve) hours as needed for moderate pain.    Dispense:  60 tablet    Refill:  0    [name]

## 2015-05-28 NOTE — Assessment & Plan Note (Signed)
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 There is no mass in this area. This is been going on for a number of years. No swallowing difficulties. No throat swelling or mouth issues with this. Do not feel a defect on exam today. I did discuss imaging to evaluate this further, though the patient declined this at this time and opted to continue to monitor. We will continue to monitor this. She is given return precautions.

## 2015-05-28 NOTE — Assessment & Plan Note (Addendum)
Long history of bilateral hand pain in her finger joints. Note there are what appear to be small joint nodules on the DIP joints. Discussed potential imaging versus lab workup for arthritic component. Patient opted for lab workup at this time. We will order rheumatoid factor, ANA, and ESR. If These are negative would consider x-rays of the hands. We'll refill her tramadol.

## 2015-05-28 NOTE — Assessment & Plan Note (Signed)
No active chest pain today. Has continued to have intermittent chest pain and has followed up with cardiology already with the plan for stress echo and abdominal ultrasound to evaluate for AAA. She'll continue aspirin daily. We'll continue to monitor this at this time and if she developed chest pain or shortness of breath or palpitations or diaphoresis she will seek medical attention. Given return precautions

## 2015-05-28 NOTE — Progress Notes (Signed)
Pre visit review using our clinic review tool, if applicable. No additional management support is needed unless otherwise documented below in the visit note. 

## 2015-05-28 NOTE — Assessment & Plan Note (Signed)
Recheck labs today. 

## 2015-05-28 NOTE — Patient Instructions (Signed)
Nice to see you. We will check lab work and call with the results.  Please continue your blood pressure medications. If you develop chest pain, shortness of breath, sweating, or palpitations please seek medical attention.

## 2015-05-28 NOTE — Assessment & Plan Note (Signed)
At goal. Continue current medication regimen.  

## 2015-05-31 ENCOUNTER — Encounter: Payer: Self-pay | Admitting: Family Medicine

## 2015-05-31 LAB — ANA: ANA: NEGATIVE

## 2015-06-03 ENCOUNTER — Telehealth: Payer: Self-pay | Admitting: *Deleted

## 2015-06-03 NOTE — Telephone Encounter (Signed)
Patient has requested lab results. Patient requested a call back

## 2015-06-04 NOTE — Telephone Encounter (Signed)
Notified patient that lab work was normal and we had mailed the results to her.

## 2015-06-28 ENCOUNTER — Ambulatory Visit (INDEPENDENT_AMBULATORY_CARE_PROVIDER_SITE_OTHER): Payer: No Typology Code available for payment source | Admitting: Family Medicine

## 2015-06-28 ENCOUNTER — Encounter: Payer: Self-pay | Admitting: Family Medicine

## 2015-06-28 VITALS — BP 118/84 | HR 66 | Temp 98.2°F | Ht 66.43 in | Wt 168.4 lb

## 2015-06-28 DIAGNOSIS — K219 Gastro-esophageal reflux disease without esophagitis: Secondary | ICD-10-CM

## 2015-06-28 DIAGNOSIS — R0602 Shortness of breath: Secondary | ICD-10-CM | POA: Diagnosis not present

## 2015-06-28 DIAGNOSIS — R197 Diarrhea, unspecified: Secondary | ICD-10-CM

## 2015-06-28 DIAGNOSIS — M79641 Pain in right hand: Secondary | ICD-10-CM | POA: Diagnosis not present

## 2015-06-28 DIAGNOSIS — M79642 Pain in left hand: Secondary | ICD-10-CM

## 2015-06-28 MED ORDER — MELOXICAM 7.5 MG PO TABS: 7.5000 mg | ORAL_TABLET | Freq: Every day | ORAL | Status: AC

## 2015-06-28 MED ORDER — TRAMADOL HCL 50 MG PO TABS: 50.0000 mg | ORAL_TABLET | Freq: Two times a day (BID) | ORAL | Status: DC | PRN

## 2015-06-28 NOTE — Progress Notes (Signed)
Pre visit review using our clinic review tool, if applicable. No additional management support is needed unless otherwise documented below in the visit note. 

## 2015-06-28 NOTE — Patient Instructions (Signed)
Nice to see you. We will obtain x-rays of her hands to evaluate her joint pain. Please consider whether or not you want to proceed with referral to neurology versus MRI of her neck. We will refer you to GI for evaluation of her diarrhea and for colonoscopy. If you develop chest pain, shortness of breath, palpitations, abdominal pain, blood in your stool, or any new or changing symptoms please seek medical attention.

## 2015-06-29 ENCOUNTER — Encounter: Payer: Self-pay | Admitting: Gastroenterology

## 2015-06-29 DIAGNOSIS — K219 Gastro-esophageal reflux disease without esophagitis: Secondary | ICD-10-CM | POA: Insufficient documentation

## 2015-06-29 DIAGNOSIS — R197 Diarrhea, unspecified: Secondary | ICD-10-CM | POA: Insufficient documentation

## 2015-06-29 NOTE — Progress Notes (Signed)
Patient ID: Valerie Forbes, female   DOB: [birth year], 54 y.o.   MRN: [id number]  [name], MD Phone: [phone]  Valerie Forbes is a 54 y.o. female who presents today for follow-up.  Diarrhea: Patient notes she has had issues with intermittent diarrhea for most of her life. She notes she has a diagnosis of IBS. She states for the last several weeks she has had liquid stool with some urgency. She has not had any incontinence. She denies abdominal pain. She denies nausea and vomiting. She's not had any new foods, or traveled or had any new water sources. She does note occasionally there will be a reddish streak in the stool with the last being 2-3 days ago. She's not had any gross blood from her rectum. She had colonoscopy 23 years ago that did not reveal any abnormalities. She has been taking naproxen and Prilosec. She does have heartburn and the Prilosec helps with this significantly. She has not had heartburn since starting the Prilosec.  Exertional dyspnea: patient notes this is much improved. Only notices this minimally after she has gone up a whole flight of stairs. She notes she saw cardiology and had a normal stress echo. She's not had any chest pain with this.  Patient notes she has intermittent joint pain and foot, ankle, and hand swelling. She notes all the joints in her hands hurt. She notes her legs will hurt. She occasionally gets a sharp pain down the anterior aspect of her left leg. She notes decreased strength in her right hand, then says that she can't carry anything using her right arm because of strength issues. She notes rare tingling in her hand and a cold sensation in her hand. She has no neck pain with this. She no other numbness or weakness.  PMH: Smoker.   ROS see history of present illness  Objective  Physical Exam Filed Vitals:   06/28/15 1407  BP: 118/84  Pulse: 66  Temp: 98.2 F (36.8 C)   Physical Exam  Constitutional: She is  well-developed, well-nourished, and in no distress.  HENT:  Head: Normocephalic.  Right Ear: External ear normal.  Left Ear: External ear normal.  Mouth/Throat: Oropharynx is clear and moist. No oropharyngeal exudate.  Eyes: Conjunctivae are normal. Pupils are equal, round, and reactive to light.  Cardiovascular: Normal rate and regular rhythm.   Pulmonary/Chest: Effort normal and breath sounds normal.  Abdominal: Soft. Bowel sounds are normal. She exhibits no distension. There is no tenderness. There is no rebound and no guarding.  Musculoskeletal: She exhibits no edema.  No joint swelling in the hands, knees, or wrists, there is no tenderness of the knees, there is no ligamentous laxity of the knees, negative McMurray's in the knees, there is no tenderness of the hands or wrists, she has full range of motion in her hands  Neurological: She is alert.  5/5 strength in bilateral biceps, triceps, grip, quads, hamstrings, plantar and dorsiflexion, sensation to light touch intact in bilateral UE and LE, normal gait, 2+ patellar reflexes  Skin: Skin is warm and dry. She is not diaphoretic.   negative Phalen's and Tinel's   Assessment/Plan: Please see individual problem list.  Bilateral hand pain This continues to be an issue. Her prior lab workup was negative. She does note reported right upper extremity weakness, though on exam strength appears normal. She does have occasional tingling in her right hand. She is neurologically intact today. Question is, is this a bony issue or is she  having a nerve issue specifically in her right hand and arm. I do think she would benefit from x-rays of her bilateral hands given the degree of discomfort she has in her joints. I also discussed obtaining MRI of her neck versus referral to neurology or sports medicine for EMG testing to determine if there is a nerve issue or nerve compression leading to her sensation of right arm and hand weakness. She is unsure if she  wants to do this at this time. We will proceed with the x-rays and the patient will contemplate whether or not she wants an MRI or referral. We will refill her tramadol. We will change her naproxen to meloxicam. She is advised take this with food as she has not been doing this to this point. She is given return precautions.  Exertional shortness of breath Today patient states she never had chest pain. Her shortness of breath has improved. She had a negative stress echo. She has no chest pain or shortness of breath at this time. Suspect this is related to deconditioning. Given negative cardiac workup we will continue to monitor this and if it persists or worsens she'll seek medical attention. She is given return precautions.  Diarrhea This is a persistent chronic intermittent issue for the patient. Likely related to IBS. She has noted a minimal amount of reddish streaks in her stool recently. I discussed doing a rectal exam today to evaluate this, though she declined this. She's not had any gross blood. Her vital signs are stable. We'll give her stool cards to check this. We will refer her to GI as well given she has not had a colonoscopy in 23 years. She has benign abdominal exam today. She'll continue to monitor this. If it worsens she'll seek medical attention. She's given return precautions.  GERD (gastroesophageal reflux disease) Patient describes symptoms of reflux. Improved with omeprazole. We will continue this medication and provide a prescription. She is advised that she needs to take NSAIDs with food she has not been doing this. She needs to monitor while on meloxicam and if her reflux worse and she needs to stop this and let us know.    Orders Placed This Encounter  Procedures  . DG Hand Complete Right    Standing Status: Future     Number of Occurrences:      Standing Expiration Date: 08/28/2016    Order Specific Question:  Reason for Exam (SYMPTOM  OR DIAGNOSIS REQUIRED)    Answer:   bilateral hand joint pain    Order Specific Question:  Is the patient pregnant?    Answer:  No    Order Specific Question:  Preferred imaging location?    Answer:  [facility]  . DG Hand Complete Left    Standing Status: Future     Number of Occurrences:      Standing Expiration Date: 08/28/2016    Order Specific Question:  Reason for Exam (SYMPTOM  OR DIAGNOSIS REQUIRED)    Answer:  bilateral hand joint pain    Order Specific Question:  Is the patient pregnant?    Answer:  No    Order Specific Question:  Preferred imaging location?    Answer:  [facility]  . Ambulatory referral to Gastroenterology    Referral Priority:  Routine    Referral Type:  Consultation    Referral Reason:  Specialty Services Required    Number of Visits Requested:  1    Meds ordered this encounter  Medications  .  DISCONTD: naproxen (NAPROSYN) 500 MG tablet    Sig: Take 500 mg by mouth 2 (two) times daily with a meal.  . meloxicam (MOBIC) 7.5 MG tablet    Sig: Take 1 tablet (7.5 mg total) by mouth daily.    Dispense:  30 tablet    Refill:  0  . DISCONTD: traMADol (ULTRAM) 50 MG tablet    Sig: Take 1 tablet (50 mg total) by mouth every 12 (twelve) hours as needed for moderate pain.    Dispense:  60 tablet    Refill:  0  . traMADol (ULTRAM) 50 MG tablet    Sig: Take 1 tablet (50 mg total) by mouth every 12 (twelve) hours as needed for moderate pain.    Dispense:  60 tablet    Refill:  2    [name]

## 2015-06-29 NOTE — Assessment & Plan Note (Signed)
Patient describes symptoms of reflux. Improved with omeprazole. We will continue this medication and provide a prescription. She is advised that she needs to take NSAIDs with food she has not been doing this. She needs to monitor while on meloxicam and if her reflux worse and she needs to stop this and let us know.

## 2015-06-29 NOTE — Assessment & Plan Note (Addendum)
This continues to be an issue. Her prior lab workup was negative. She does note reported right upper extremity weakness, though on exam strength appears normal. She does have occasional tingling in her right hand. She is neurologically intact today. Question is, is this a bony issue or is she having a nerve issue specifically in her right hand and arm. I do think she would benefit from x-rays of her bilateral hands given the degree of discomfort she has in her joints. I also discussed obtaining MRI of her neck versus referral to neurology or sports medicine for EMG testing to determine if there is a nerve issue or nerve compression leading to her sensation of right arm and hand weakness. She is unsure if she wants to do this at this time. We will proceed with the x-rays and the patient will contemplate whether or not she wants an MRI or referral. We will refill her tramadol. We will change her naproxen to meloxicam. She is advised take this with food as she has not been doing this to this point. She is given return precautions.

## 2015-06-29 NOTE — Assessment & Plan Note (Signed)
Today patient states she never had chest pain. Her shortness of breath has improved. She had a negative stress echo. She has no chest pain or shortness of breath at this time. Suspect this is related to deconditioning. Given negative cardiac workup we will continue to monitor this and if it persists or worsens she'll seek medical attention. She is given return precautions.

## 2015-06-29 NOTE — Assessment & Plan Note (Signed)
This is a persistent chronic intermittent issue for the patient. Likely related to IBS. She has noted a minimal amount of reddish streaks in her stool recently. I discussed doing a rectal exam today to evaluate this, though she declined this. She's not had any gross blood. Her vital signs are stable. We'll give her stool cards to check this. We will refer her to GI as well given she has not had a colonoscopy in 23 years. She has benign abdominal exam today. She'll continue to monitor this. If it worsens she'll seek medical attention. She's given return precautions.

## 2015-07-06 ENCOUNTER — Other Ambulatory Visit (INDEPENDENT_AMBULATORY_CARE_PROVIDER_SITE_OTHER): Payer: No Typology Code available for payment source

## 2015-07-06 ENCOUNTER — Other Ambulatory Visit: Payer: Self-pay | Admitting: *Deleted

## 2015-07-06 ENCOUNTER — Other Ambulatory Visit: Payer: No Typology Code available for payment source

## 2015-07-06 DIAGNOSIS — Z1211 Encounter for screening for malignant neoplasm of colon: Secondary | ICD-10-CM

## 2015-07-07 LAB — FECAL OCCULT BLOOD, IMMUNOCHEMICAL: Fecal Occult Bld: NEGATIVE

## 2015-07-08 ENCOUNTER — Encounter: Payer: Self-pay | Admitting: Family Medicine

## 2015-07-21 ENCOUNTER — Ambulatory Visit (INDEPENDENT_AMBULATORY_CARE_PROVIDER_SITE_OTHER): Payer: No Typology Code available for payment source | Admitting: Family Medicine

## 2015-07-21 ENCOUNTER — Encounter: Payer: Self-pay | Admitting: Family Medicine

## 2015-07-21 VITALS — BP 128/86 | HR 69 | Temp 97.7°F | Ht 66.43 in | Wt 158.1 lb

## 2015-07-21 DIAGNOSIS — M79641 Pain in right hand: Secondary | ICD-10-CM | POA: Diagnosis not present

## 2015-07-21 DIAGNOSIS — M791 Myalgia, unspecified site: Secondary | ICD-10-CM

## 2015-07-21 DIAGNOSIS — G629 Polyneuropathy, unspecified: Secondary | ICD-10-CM | POA: Diagnosis not present

## 2015-07-21 DIAGNOSIS — M25511 Pain in right shoulder: Secondary | ICD-10-CM

## 2015-07-21 DIAGNOSIS — M25512 Pain in left shoulder: Secondary | ICD-10-CM

## 2015-07-21 DIAGNOSIS — M79642 Pain in left hand: Secondary | ICD-10-CM

## 2015-07-21 MED ORDER — OMEPRAZOLE 20 MG PO CPDR: 20.0000 mg | DELAYED_RELEASE_CAPSULE | Freq: Every day | ORAL | Status: AC

## 2015-07-21 MED ORDER — PREDNISONE 10 MG PO TABS: ORAL_TABLET | ORAL | Status: AC

## 2015-07-21 NOTE — Progress Notes (Signed)
Pre visit review using our clinic review tool, if applicable. No additional management support is needed unless otherwise documented below in the visit note. 

## 2015-07-21 NOTE — Assessment & Plan Note (Signed)
Suspect this is related to rotator cuff tendinitis given exam, though could be an arthritic issue versus neuropathy given her hand findings. She is given exercises to complete for rotator cuff tendinitis. We will treat her with prednisone as an anti-inflammatory as other anti-inflammatories have not been beneficial. She's given return precautions.

## 2015-07-21 NOTE — Patient Instructions (Addendum)
Nice to see you. Please go get the x-rays. We will check some lab work and call you with results. Please start the prednisone see if this helps. If you develop abdominal pain, fevers, worsening muscle aches, numbness, weakness, neck pain, fever, headache, or any new or changing symptoms please seek medical attention.  Impingement Syndrome, Rotator Cuff, Bursitis With Rehab Impingement syndrome is a condition that involves inflammation of the tendons of the rotator cuff and the subacromial bursa, that causes pain in the shoulder. The rotator cuff consists of four tendons and muscles that control much of the shoulder and upper arm function. The subacromial bursa is a fluid filled sac that helps reduce friction between the rotator cuff and one of the bones of the shoulder (acromion). Impingement syndrome is usually an overuse injury that causes swelling of the bursa (bursitis), swelling of the tendon (tendonitis), and/or a tear of the tendon (strain). Strains are classified into three categories. Grade 1 strains cause pain, but the tendon is not lengthened. Grade 2 strains include a lengthened ligament, due to the ligament being stretched or partially ruptured. With grade 2 strains there is still function, although the function may be decreased. Grade 3 strains include a complete tear of the tendon or muscle, and function is usually impaired. SYMPTOMS   Pain around the shoulder, often at the outer portion of the upper arm.  Pain that gets worse with shoulder function, especially when reaching overhead or lifting.  Sometimes, aching when not using the arm.  Pain that wakes you up at night.  Sometimes, tenderness, swelling, warmth, or redness over the affected area.  Loss of strength.  Limited motion of the shoulder, especially reaching behind the back (to the back pocket or to unhook bra) or across your body.  Crackling sound (crepitation) when moving the arm.  Biceps tendon pain and  inflammation (in the front of the shoulder). Worse when bending the elbow or lifting. CAUSES  Impingement syndrome is often an overuse injury, in which chronic (repetitive) motions cause the tendons or bursa to become inflamed. A strain occurs when a force is paced on the tendon or muscle that is greater than it can withstand. Common mechanisms of injury include: Stress from sudden increase in duration, frequency, or intensity of training.  Direct hit (trauma) to the shoulder.  Aging, erosion of the tendon with normal use.  Bony bump on shoulder (acromial spur). RISK INCREASES WITH:  Contact sports (football, wrestling, boxing).  Throwing sports (baseball, tennis, volleyball).  Weightlifting and bodybuilding.  Heavy labor.  Previous injury to the rotator cuff, including impingement.  Poor shoulder strength and flexibility.  Failure to warm up properly before activity.  Inadequate protective equipment.  Old age.  Bony bump on shoulder (acromial spur). PREVENTION   Warm up and stretch properly before activity.  Allow for adequate recovery between workouts.  Maintain physical fitness:  Strength, flexibility, and endurance.  Cardiovascular fitness.  Learn and use proper exercise technique. PROGNOSIS  If treated properly, impingement syndrome usually goes away within 6 weeks. Sometimes surgery is required.  RELATED COMPLICATIONS   Longer healing time if not properly treated, or if not given enough time to heal.  Recurring symptoms, that result in a chronic condition.  Shoulder stiffness, frozen shoulder, or loss of motion.  Rotator cuff tendon tear.  Recurring symptoms, especially if activity is resumed too soon, with overuse, with a direct blow, or when using poor technique. TREATMENT  Treatment first involves the use of ice and medicine,  to reduce pain and inflammation. The use of strengthening and stretching exercises may help reduce pain with activity. These  exercises may be performed at home or with a therapist. If non-surgical treatment is unsuccessful after more than 6 months, surgery may be advised. After surgery and rehabilitation, activity is usually possible in 3 months.  MEDICATION  If pain medicine is needed, nonsteroidal anti-inflammatory medicines (aspirin and ibuprofen), or other minor pain relievers (acetaminophen), are often advised.  Do not take pain medicine for 7 days before surgery.  Prescription pain relievers may be given, if your caregiver thinks they are needed. Use only as directed and only as much as you need.  Corticosteroid injections may be given by your caregiver. These injections should be reserved for the most serious cases, because they may only be given a certain number of times. HEAT AND COLD  Cold treatment (icing) should be applied for 10 to 15 minutes every 2 to 3 hours for inflammation and pain, and immediately after activity that aggravates your symptoms. Use ice packs or an ice massage.  Heat treatment may be used before performing stretching and strengthening activities prescribed by your caregiver, physical therapist, or athletic trainer. Use a heat pack or a warm water soak. SEEK MEDICAL CARE IF:   Symptoms get worse or do not improve in 4 to 6 weeks, despite treatment.  New, unexplained symptoms develop. (Drugs used in treatment may produce side effects.) EXERCISES  RANGE OF MOTION (ROM) AND STRETCHING EXERCISES - Impingement Syndrome (Rotator Cuff  Tendinitis, Bursitis) These exercises may help you when beginning to rehabilitate your injury. Your symptoms may go away with or without further involvement from your physician, physical therapist or athletic trainer. While completing these exercises, remember:   Restoring tissue flexibility helps normal motion to return to the joints. This allows healthier, less painful movement and activity.  An effective stretch should be held for at least 30  seconds.  A stretch should never be painful. You should only feel a gentle lengthening or release in the stretched tissue. STRETCH - Flexion, Standing  Stand with good posture. With an underhand grip on your right / left hand, and an overhand grip on the opposite hand, grasp a broomstick or cane so that your hands are a little more than shoulder width apart.  Keeping your right / left elbow straight and shoulder muscles relaxed, push the stick with your opposite hand, to raise your right / left arm in front of your body and then overhead. Raise your arm until you feel a stretch in your right / left shoulder, but before you have increased shoulder pain.  Try to avoid shrugging your right / left shoulder as your arm rises, by keeping your shoulder blade tucked down and toward your mid-back spine. Hold for __________ seconds.  Slowly return to the starting position. Repeat __________ times. Complete this exercise __________ times per day. STRETCH - Abduction, Supine  Lie on your back. With an underhand grip on your right / left hand and an overhand grip on the opposite hand, grasp a broomstick or cane so that your hands are a little more than shoulder width apart.  Keeping your right / left elbow straight and your shoulder muscles relaxed, push the stick with your opposite hand, to raise your right / left arm out to the side of your body and then overhead. Raise your arm until you feel a stretch in your right / left shoulder, but before you have increased shoulder pain.  Try to avoid shrugging your right / left shoulder as your arm rises, by keeping your shoulder blade tucked down and toward your mid-back spine. Hold for __________ seconds.  Slowly return to the starting position. Repeat __________ times. Complete this exercise __________ times per day. ROM - Flexion, Active-Assisted  Lie on your back. You may bend your knees for comfort.  Grasp a broomstick or cane so your hands are about  shoulder width apart. Your right / left hand should grip the end of the stick, so that your hand is positioned "thumbs-up," as if you were about to shake hands.  Using your healthy arm to lead, raise your right / left arm overhead, until you feel a gentle stretch in your shoulder. Hold for __________ seconds.  Use the stick to assist in returning your right / left arm to its starting position. Repeat __________ times. Complete this exercise __________ times per day.  ROM - Internal Rotation, Supine   Lie on your back on a firm surface. Place your right / left elbow about 60 degrees away from your side. Elevate your elbow with a folded towel, so that the elbow and shoulder are the same height.  Using a broomstick or cane and your strong arm, pull your right / left hand toward your body until you feel a gentle stretch, but no increase in your shoulder pain. Keep your shoulder and elbow in place throughout the exercise.  Hold for __________ seconds. Slowly return to the starting position. Repeat __________ times. Complete this exercise __________ times per day. STRETCH - Internal Rotation  Place your right / left hand behind your back, palm up.  Throw a towel or belt over your opposite shoulder. Grasp the towel with your right / left hand.  While keeping an upright posture, gently pull up on the towel, until you feel a stretch in the front of your right / left shoulder.  Avoid shrugging your right / left shoulder as your arm rises, by keeping your shoulder blade tucked down and toward your mid-back spine.  Hold for __________ seconds. Release the stretch, by lowering your healthy hand. Repeat __________ times. Complete this exercise __________ times per day. ROM - Internal Rotation   Using an underhand grip, grasp a stick behind your back with both hands.  While standing upright with good posture, slide the stick up your back until you feel a mild stretch in the front of your  shoulder.  Hold for __________ seconds. Slowly return to your starting position. Repeat __________ times. Complete this exercise __________ times per day.  STRETCH - Posterior Shoulder Capsule   Stand or sit with good posture. Grasp your right / left elbow and draw it across your chest, keeping it at the same height as your shoulder.  Pull your elbow, so your upper arm comes in closer to your chest. Pull until you feel a gentle stretch in the back of your shoulder.  Hold for __________ seconds. Repeat __________ times. Complete this exercise __________ times per day. STRENGTHENING EXERCISES - Impingement Syndrome (Rotator Cuff Tendinitis, Bursitis) These exercises may help you when beginning to rehabilitate your injury. They may resolve your symptoms with or without further involvement from your physician, physical therapist or athletic trainer. While completing these exercises, remember:  Muscles can gain both the endurance and the strength needed for everyday activities through controlled exercises.  Complete these exercises as instructed by your physician, physical therapist or athletic trainer. Increase the resistance and repetitions only as guided.  You may experience muscle soreness or fatigue, but the pain or discomfort you are trying to eliminate should never worsen during these exercises. If this pain does get worse, stop and make sure you are following the directions exactly. If the pain is still present after adjustments, discontinue the exercise until you can discuss the trouble with your clinician.  During your recovery, avoid activity or exercises which involve actions that place your injured hand or elbow above your head or behind your back or head. These positions stress the tissues which you are trying to heal. STRENGTH - Scapular Depression and Adduction   With good posture, sit on a firm chair. Support your arms in front of you, with pillows, arm rests, or on a table top.  Have your elbows in line with the sides of your body.  Gently draw your shoulder blades down and toward your mid-back spine. Gradually increase the tension, without tensing the muscles along the top of your shoulders and the back of your neck.  Hold for __________ seconds. Slowly release the tension and relax your muscles completely before starting the next repetition.  After you have practiced this exercise, remove the arm support and complete the exercise in standing as well as sitting position. Repeat __________ times. Complete this exercise __________ times per day.  STRENGTH - Shoulder Abductors, Isometric  With good posture, stand or sit about 4-6 inches from a wall, with your right / left side facing the wall.  Bend your right / left elbow. Gently press your right / left elbow into the wall. Increase the pressure gradually, until you are pressing as hard as you can, without shrugging your shoulder or increasing any shoulder discomfort.  Hold for __________ seconds.  Release the tension slowly. Relax your shoulder muscles completely before you begin the next repetition. Repeat __________ times. Complete this exercise __________ times per day.  STRENGTH - External Rotators, Isometric  Keep your right / left elbow at your side and bend it 90 degrees.  Step into a door frame so that the outside of your right / left wrist can press against the door frame without your upper arm leaving your side.  Gently press your right / left wrist into the door frame, as if you were trying to swing the back of your hand away from your stomach. Gradually increase the tension, until you are pressing as hard as you can, without shrugging your shoulder or increasing any shoulder discomfort.  Hold for __________ seconds.  Release the tension slowly. Relax your shoulder muscles completely before you begin the next repetition. Repeat __________ times. Complete this exercise __________ times per day.   STRENGTH - Supraspinatus   Stand or sit with good posture. Grasp a __________ weight, or an exercise band or tubing, so that your hand is "thumbs-up," like you are shaking hands.  Slowly lift your right / left arm in a "V" away from your thigh, diagonally into the space between your side and straight ahead. Lift your hand to shoulder height or as far as you can, without increasing any shoulder pain. At first, many people do not lift their hands above shoulder height.  Avoid shrugging your right / left shoulder as your arm rises, by keeping your shoulder blade tucked down and toward your mid-back spine.  Hold for __________ seconds. Control the descent of your hand, as you slowly return to your starting position. Repeat __________ times. Complete this exercise __________ times per day.  STRENGTH - External Rotators  Secure a rubber exercise band or tubing to a fixed object (table, pole) so that it is at the same height as your right / left elbow when you are standing or sitting on a firm surface.  Stand or sit so that the secured exercise band is at your uninjured side.  Bend your right / left elbow 90 degrees. Place a folded towel or small pillow under your right / left arm, so that your elbow is a few inches away from your side.  Keeping the tension on the exercise band, pull it away from your body, as if pivoting on your elbow. Be sure to keep your body steady, so that the movement is coming only from your rotating shoulder.  Hold for __________ seconds. Release the tension in a controlled manner, as you return to the starting position. Repeat __________ times. Complete this exercise __________ times per day.  STRENGTH - Internal Rotators   Secure a rubber exercise band or tubing to a fixed object (table, pole) so that it is at the same height as your right / left elbow when you are standing or sitting on a firm surface.  Stand or sit so that the secured exercise band is at your right /  left side.  Bend your elbow 90 degrees. Place a folded towel or small pillow under your right / left arm so that your elbow is a few inches away from your side.  Keeping the tension on the exercise band, pull it across your body, toward your stomach. Be sure to keep your body steady, so that the movement is coming only from your rotating shoulder.  Hold for __________ seconds. Release the tension in a controlled manner, as you return to the starting position. Repeat __________ times. Complete this exercise __________ times per day.  STRENGTH - Scapular Protractors, Standing   Stand arms length away from a wall. Place your hands on the wall, keeping your elbows straight.  Begin by dropping your shoulder blades down and toward your mid-back spine.  To strengthen your protractors, keep your shoulder blades down, but slide them forward on your rib cage. It will feel as if you are lifting the back of your rib cage away from the wall. This is a subtle motion and can be challenging to complete. Ask your caregiver for further instruction, if you are not sure you are doing the exercise correctly.  Hold for __________ seconds. Slowly return to the starting position, resting the muscles completely before starting the next repetition. Repeat __________ times. Complete this exercise __________ times per day. STRENGTH - Scapular Protractors, Supine  Lie on your back on a firm surface. Extend your right / left arm straight into the air while holding a __________ weight in your hand.  Keeping your head and back in place, lift your shoulder off the floor.  Hold for __________ seconds. Slowly return to the starting position, and allow your muscles to relax completely before starting the next repetition. Repeat __________ times. Complete this exercise __________ times per day. STRENGTH - Scapular Protractors, Quadruped  Get onto your hands and knees, with your shoulders directly over your hands (or as close  as you can be, comfortably).  Keeping your elbows locked, lift the back of your rib cage up into your shoulder blades, so your mid-back rounds out. Keep your neck muscles relaxed.  Hold this position for __________ seconds. Slowly return to the starting position and allow your muscles to relax completely before starting the next repetition.  Repeat __________ times. Complete this exercise __________ times per day.  STRENGTH - Scapular Retractors  Secure a rubber exercise band or tubing to a fixed object (table, pole), so that it is at the height of your shoulders when you are either standing, or sitting on a firm armless chair.  With a palm down grip, grasp an end of the band in each hand. Straighten your elbows and lift your hands straight in front of you, at shoulder height. Step back, away from the secured end of the band, until it becomes tense.  Squeezing your shoulder blades together, draw your elbows back toward your sides, as you bend them. Keep your upper arms lifted away from your body throughout the exercise.  Hold for __________ seconds. Slowly ease the tension on the band, as you reverse the directions and return to the starting position. Repeat __________ times. Complete this exercise __________ times per day. STRENGTH - Shoulder Extensors   Secure a rubber exercise band or tubing to a fixed object (table, pole) so that it is at the height of your shoulders when you are either standing, or sitting on a firm armless chair.  With a thumbs-up grip, grasp an end of the band in each hand. Straighten your elbows and lift your hands straight in front of you, at shoulder height. Step back, away from the secured end of the band, until it becomes tense.  Squeezing your shoulder blades together, pull your hands down to the sides of your thighs. Do not allow your hands to go behind you.  Hold for __________ seconds. Slowly ease the tension on the band, as you reverse the directions and  return to the starting position. Repeat __________ times. Complete this exercise __________ times per day.  STRENGTH - Scapular Retractors and External Rotators   Secure a rubber exercise band or tubing to a fixed object (table, pole) so that it is at the height as your shoulders, when you are either standing, or sitting on a firm armless chair.  With a palm down grip, grasp an end of the band in each hand. Bend your elbows 90 degrees and lift your elbows to shoulder height, at your sides. Step back, away from the secured end of the band, until it becomes tense.  Squeezing your shoulder blades together, rotate your shoulders so that your upper arms and elbows remain stationary, but your fists travel upward to head height.  Hold for __________ seconds. Slowly ease the tension on the band, as you reverse the directions and return to the starting position. Repeat __________ times. Complete this exercise __________ times per day.  STRENGTH - Scapular Retractors and External Rotators, Rowing   Secure a rubber exercise band or tubing to a fixed object (table, pole) so that it is at the height of your shoulders, when you are either standing, or sitting on a firm armless chair.  With a palm down grip, grasp an end of the band in each hand. Straighten your elbows and lift your hands straight in front of you, at shoulder height. Step back, away from the secured end of the band, until it becomes tense.  Step 1: Squeeze your shoulder blades together. Bending your elbows, draw your hands to your chest, as if you are rowing a boat. At the end of this motion, your hands and elbow should be at shoulder height and your elbows should be out to your sides.  Step 2: Rotate your shoulders, to raise your hands above your head. Your  forearms should be vertical and your upper arms should be horizontal.  Hold for __________ seconds. Slowly ease the tension on the band, as you reverse the directions and return to the  starting position. Repeat __________ times. Complete this exercise __________ times per day.  STRENGTH - Scapular Depressors  Find a sturdy chair without wheels, such as a dining room chair.  Keeping your feet on the floor, and your hands on the chair arms, lift your bottom up from the seat, and lock your elbows.  Keeping your elbows straight, allow gravity to pull your body weight down. Your shoulders will rise toward your ears.  Raise your body against gravity by drawing your shoulder blades down your back, shortening the distance between your shoulders and ears. Although your feet should always maintain contact with the floor, your feet should progressively support less body weight, as you get stronger.  Hold for __________ seconds. In a controlled and slow manner, lower your body weight to begin the next repetition. Repeat __________ times. Complete this exercise __________ times per day.    This information is not intended to replace advice given to you by your health care provider. Make sure you discuss any questions you have with your health care provider.   Document Released: 07/10/2005 Document Revised: 07/31/2014 Document Reviewed: 10/22/2008 [employer] Interactive Patient Education [employer]

## 2015-07-21 NOTE — Progress Notes (Signed)
Patient ID: Valerie Forbes, female   DOB: [birth year], 54 y.o.   MRN: [id number]  Valerie Alar, MD Phone: [phone]  Valerie Forbes is a 54 y.o. female who presents today for follow-up.  Patient notes continued bilateral hand pain. She states it is like her hands are on fire. She notes intermittently her fingers feel swollen. She notes her joints do hurt, though she feels that it is the bones between her joints that hurt. As the day goes on her hands get more stiff. She does note some weakness in both hands due to pain. She also notes worsened pain in her bilateral shoulders at the lateral deltoid on abduction and internal rotation. Notes this makes it hard to pick things up. She does note chronic intermittent minimal neck discomfort, though this does not radiate. She has tramadol and Mobic that are not helping that much. She has taken gabapentin in the past, though this makes her drowsy. She did not get the x-rays of her hands. The rest of her prior workup has been negative.  PMH: Current smoker   ROS see history of present illness  Objective  Physical Exam Filed Vitals:   07/21/15 1455  BP: 128/86  Pulse: 69  Temp: 97.7 F (36.5 C)    Physical Exam  Constitutional: She is well-developed, well-nourished, and in no distress.  HENT:  Head: Normocephalic and atraumatic.  Cardiovascular: Normal rate, regular rhythm and normal heart sounds.  Exam reveals no gallop and no friction rub.   No murmur heard. Pulmonary/Chest: Effort normal and breath sounds normal. No respiratory distress. She has no wheezes. She has no rales.  Musculoskeletal:  Bilateral hands with no swelling, tenderness, erythema, decreased range of motion, there warm and well perfused, 2+ radial pulses, bilateral shoulders with discomfort on palpation of lateral deltoid insertion, full range of motion, positive empty can, negative speeds, discomfort on internal rotation, no discomfort on external rotation,  no bruising, no swelling, shoulders are symmetric, no midline neck tenderness, no midline spine tenderness, no midline neck or spine step-off, no muscular neck or back tenderness or swelling  Neurological: She is alert.  5/5 strength in bilateral biceps, triceps, grip, quads, hamstrings, plantar and dorsiflexion, sensation to light touch intact in bilateral UE and LE, normal gait, 2+ patellar reflexes  Skin: Skin is warm and dry. She is not diaphoretic.     Assessment/Plan: Please see individual problem list.  Bilateral hand pain This continues to be an issue. Prior workup has been negative. She reports bilateral upper extremity weakness and pain in her bilateral shoulders as well as her hands. I again discussed that she needs to obtain the x-rays. She will go get these in the next several days. We will check a CK, vitamin D, and B12 to rule those out as causes. Suspect she could have some sort of neuropathy given the burning sensation though could also be related to an arthritic condition. Given burning sensation I did discuss having her see neurology, though she declined this at this time. Given possible inflammatory component to this we will trial her on a short course of prednisone see if this will prove beneficial. She has neurologically intact. She did repeatedly asked for pain medicine stronger than tramadol and I advised that I would not prescribe this and advised we should try the prednisone in combination with her current tramadol dose advised not to take Mobitz with prednisone. She is advised to take omeprazole while on prednisone given her history of GERD. She  is given return precautions.  Bilateral shoulder pain Suspect this is related to rotator cuff tendinitis given exam, though could be an arthritic issue versus neuropathy given her hand findings. She is given exercises to complete for rotator cuff tendinitis. We will treat her with prednisone as an anti-inflammatory as other  anti-inflammatories have not been beneficial. She's given return precautions.    Orders Placed This Encounter  Procedures  . CK (Creatine Kinase)  . Vitamin D (25 hydroxy)  . B12    Meds ordered this encounter  Medications  . predniSONE (DELTASONE) 10 MG tablet    Sig: Take 60 mg (6 tablets) today by mouth, then decrease by one tablet daily    Dispense:  21 tablet    Refill:  0  . omeprazole (PRILOSEC) 20 MG capsule    Sig: Take 1 capsule (20 mg total) by mouth daily.    Dispense:  30 capsule    Refill:  3    Dragon voice recognition software was used during the dictation process of this note. If any phrases or words seem inappropriate it is likely secondary to the translation process being inefficient.  Valerie AlarEric Sonnenberg

## 2015-07-21 NOTE — Assessment & Plan Note (Signed)
This continues to be an issue. Prior workup has been negative. She reports bilateral upper extremity weakness and pain in her bilateral shoulders as well as her hands. I again discussed that she needs to obtain the x-rays. She will go get these in the next several days. We will check a CK, vitamin D, and B12 to rule those out as causes. Suspect she could have some sort of neuropathy given the burning sensation though could also be related to an arthritic condition. Given burning sensation I did discuss having her see neurology, though she declined this at this time. Given possible inflammatory component to this we will trial her on a short course of prednisone see if this will prove beneficial. She has neurologically intact. She did repeatedly asked for pain medicine stronger than tramadol and I advised that I would not prescribe this and advised we should try the prednisone in combination with her current tramadol dose advised not to take Mobitz with prednisone. She is advised to take omeprazole while on prednisone given her history of GERD. She is given return precautions.

## 2015-07-22 LAB — VITAMIN D 25 HYDROXY (VIT D DEFICIENCY, FRACTURES): VITD: 20.05 ng/mL — AB (ref 30.00–100.00)

## 2015-07-22 LAB — VITAMIN B12: Vitamin B-12: 244 pg/mL (ref 211–911)

## 2015-07-22 LAB — CK: Total CK: 47 U/L (ref 7–177)

## 2015-07-23 ENCOUNTER — Other Ambulatory Visit: Payer: Self-pay | Admitting: Family Medicine

## 2015-07-23 ENCOUNTER — Telehealth: Payer: Self-pay | Admitting: *Deleted

## 2015-07-23 MED ORDER — ERGOCALCIFEROL 1.25 MG (50000 UT) PO CAPS: 50000.0000 [IU] | ORAL_CAPSULE | ORAL | Status: DC

## 2015-07-23 NOTE — Telephone Encounter (Signed)
Spoke with patient, see result note

## 2015-07-23 NOTE — Telephone Encounter (Signed)
Patient requested her lab results from 07/21/15. Please advise

## 2015-07-28 ENCOUNTER — Ambulatory Visit (INDEPENDENT_AMBULATORY_CARE_PROVIDER_SITE_OTHER)
Admission: RE | Admit: 2015-07-28 | Discharge: 2015-07-28 | Disposition: A | Discharge status: Home / Self Care | Payer: No Typology Code available for payment source | Source: Ambulatory Visit | Attending: Family Medicine | Admitting: Family Medicine

## 2015-07-28 ENCOUNTER — Ambulatory Visit
Admission: RE | Admit: 2015-07-28 | Discharge: 2015-07-28 | Disposition: A | Discharge status: Home / Self Care | Payer: No Typology Code available for payment source | Source: Ambulatory Visit | Attending: Family Medicine | Admitting: Family Medicine

## 2015-07-28 DIAGNOSIS — M79642 Pain in left hand: Principal | ICD-10-CM

## 2015-07-28 DIAGNOSIS — M79641 Pain in right hand: Secondary | ICD-10-CM

## 2015-07-29 ENCOUNTER — Other Ambulatory Visit: Payer: Self-pay | Admitting: Family Medicine

## 2015-07-29 DIAGNOSIS — M8580 Other specified disorders of bone density and structure, unspecified site: Secondary | ICD-10-CM

## 2015-08-30 ENCOUNTER — Ambulatory Visit
Admission: RE | Admit: 2015-08-30 | Discharge: 2015-08-30 | Disposition: A | Discharge status: Home / Self Care | Payer: No Typology Code available for payment source | Source: Ambulatory Visit | Attending: Family Medicine | Admitting: Family Medicine

## 2015-08-30 DIAGNOSIS — M8580 Other specified disorders of bone density and structure, unspecified site: Secondary | ICD-10-CM

## 2015-08-30 DIAGNOSIS — M810 Age-related osteoporosis without current pathological fracture: Secondary | ICD-10-CM | POA: Diagnosis not present

## 2015-08-30 DIAGNOSIS — Z13820 Encounter for screening for osteoporosis: Secondary | ICD-10-CM | POA: Insufficient documentation

## 2015-09-09 ENCOUNTER — Encounter: Payer: No Typology Code available for payment source | Admitting: Gastroenterology

## 2015-09-17 ENCOUNTER — Other Ambulatory Visit: Payer: Self-pay | Admitting: Family Medicine

## 2015-09-24 ENCOUNTER — Other Ambulatory Visit: Payer: Self-pay | Admitting: Family Medicine

## 2015-09-24 NOTE — Telephone Encounter (Signed)
Please advise on refill.

## 2015-09-25 ENCOUNTER — Other Ambulatory Visit: Payer: Self-pay | Admitting: Family Medicine

## 2015-09-25 NOTE — Telephone Encounter (Signed)
Last OV: 07/20/15. Next OV: 09/28/15. Okay to refill or wait until appt?

## 2015-09-27 NOTE — Telephone Encounter (Signed)
Ok to refill. I will print the prescription.

## 2015-09-28 ENCOUNTER — Ambulatory Visit: Payer: No Typology Code available for payment source | Admitting: Family Medicine

## 2015-10-01 ENCOUNTER — Encounter: Payer: Self-pay | Admitting: Family Medicine

## 2015-10-01 ENCOUNTER — Telehealth: Payer: Self-pay

## 2015-10-01 ENCOUNTER — Ambulatory Visit (INDEPENDENT_AMBULATORY_CARE_PROVIDER_SITE_OTHER): Payer: No Typology Code available for payment source | Admitting: Family Medicine

## 2015-10-01 VITALS — BP 128/68 | HR 65 | Temp 98.4°F | Ht 66.43 in | Wt 169.8 lb

## 2015-10-01 DIAGNOSIS — M545 Low back pain, unspecified: Secondary | ICD-10-CM | POA: Insufficient documentation

## 2015-10-01 DIAGNOSIS — M79641 Pain in right hand: Secondary | ICD-10-CM

## 2015-10-01 DIAGNOSIS — M810 Age-related osteoporosis without current pathological fracture: Secondary | ICD-10-CM | POA: Diagnosis not present

## 2015-10-01 DIAGNOSIS — M25449 Effusion, unspecified hand: Secondary | ICD-10-CM | POA: Diagnosis not present

## 2015-10-01 DIAGNOSIS — M79642 Pain in left hand: Secondary | ICD-10-CM

## 2015-10-01 MED ORDER — TRAMADOL HCL 50 MG PO TABS: 100.0000 mg | ORAL_TABLET | Freq: Two times a day (BID) | ORAL | Status: DC | PRN

## 2015-10-01 MED ORDER — DENOSUMAB 60 MG/ML ~~LOC~~ SOLN: 60.0000 mg | Freq: Once | SUBCUTANEOUS | Status: AC

## 2015-10-01 NOTE — Telephone Encounter (Signed)
-----   Message from Glori LuisEric G Sonnenberg, MD sent at 10/01/2015  5:08 PM EST ----- Patient needs prolia injection ordered. Thanks.

## 2015-10-01 NOTE — Assessment & Plan Note (Signed)
Nice to be an issue. Prior workup has been negative with exception of x-ray showing mild osteoarthritis. We will refer her to rheumatology for further evaluation given persistent discomfort. She can increase her tramadol to 100 mg twice daily. She will continue Tylenol. She is given return precautions.

## 2015-10-01 NOTE — Progress Notes (Signed)
Pre visit review using our clinic review tool, if applicable. No additional management support is needed unless otherwise documented below in the visit note. 

## 2015-10-01 NOTE — Telephone Encounter (Signed)
Can you please start the Prolia process. Thanks

## 2015-10-01 NOTE — Progress Notes (Signed)
Patient ID: Valerie Forbes, female   DOB: 19-Dec-1960, 55 y.o.   MRN: [id number]  [name], MD Phone: [phone]  Valerie Forbes is a 55 y.o. female who presents today for follow-up.  Bilateral hand and wrist pain: Patient notes this is unchanged from previously. She notes the pain prevents her from being able to open jars and cans. She notes her fingers and knuckles hurt the most. Wrists not quite as much. Both arms hurt as well. She denies weakness and numbness with this. She notes previously when she was on prednisone and this helped only when she is on the higher dose of it. Tramadol and Tylenol helps some. Mobic did not help any. She also notes she had diarrhea with the Mobic. Has had multiple laboratory evaluations and x-ray imaging that have been revealing of only mild osteoarthritis.  Patient also notes mild intermittent low back discomfort that occurs if she sits still for too long. He notes no numbness or weakness in her lower extremitys. No bowel or bladder incontinence. No saddle anesthesia. No fevers. No history of cancer. Notes she has no back pain at this time.  Patient also found to have osteoporosis on most recent DEXA scan. She's interested in doing prolia for this. She has a history of reflux and thus could not tolerate oral bisphosphonates. She has not yet started the vitamin D supplementation. She will also start on calcium supplementation.  PMH: Smoker   ROS see history of present illness  Objective  Physical Exam Filed Vitals:   10/01/15 1550  BP: 128/68  Pulse: 65  Temp: 98.4 F (36.9 C)    BP Readings from Last 3 Encounters:  10/01/15 128/68  07/21/15 128/86  06/28/15 118/84   Wt Readings from Last 3 Encounters:  10/01/15 169 lb 12.8 oz (77.021 kg)  07/21/15 158 lb 1.9 oz (71.723 kg)  06/28/15 168 lb 6.4 oz (76.386 kg)    Physical Exam  Constitutional: She is well-developed, well-nourished, and in no distress.  HENT:  Head:  Normocephalic and atraumatic.  Cardiovascular: Normal rate, regular rhythm and normal heart sounds.   Pulmonary/Chest: Effort normal and breath sounds normal.  Musculoskeletal:  No midline spine tenderness, no midline spine step-off, no muscular back tenderness Bilateral hands with no joint swelling, there is scattered tenderness on palpation of joints of her fingers, no tenderness of her wrists and forearms, hands are warm and well-perfused  Neurological: She is alert.  CN 2-12 intact, 5/5 strength in bilateral biceps, triceps, grip, quads, hamstrings, plantar and dorsiflexion, sensation to light touch intact in bilateral UE and LE, normal gait, 2+ patellar reflexes  Skin: Skin is warm and dry. She is not diaphoretic.     Assessment/Plan: Please see individual problem list.  Bilateral hand pain Nice to be an issue. Prior workup has been negative with exception of x-ray showing mild osteoarthritis. We will refer her to rheumatology for further evaluation given persistent discomfort. She can increase her tramadol to 100 mg twice daily. She will continue Tylenol. She is given return precautions.  Low back pain Chronic intermittent issue. Benign exam today. Neurologically intact. No red flags. She'll continue to monitor. Given return precautions.  Osteoporosis Discussed diagnosis of osteoporosis based on DEXA scan. Patient with reflux and thus is not a good candidate for oral bisphosphonates. We'll proceed with prolia. This will be ordered for the patient. She'll be called when this is available. She will need to return for a calcium check prior to receiving this. She  will start on vitamin D supplementation and calcium.    Orders Placed This Encounter  Procedures  . Comp Met (CMET)    Standing Status: Future     Number of Occurrences:      Standing Expiration Date: 09/30/2016  . Ambulatory referral to Rheumatology    Referral Priority:  Routine    Referral Type:  Consultation     Referral Reason:  Specialty Services Required    Requested Specialty:  Rheumatology    Number of Visits Requested:  1    Meds ordered this encounter  Medications  . traMADol (ULTRAM) 50 MG tablet    Sig: Take 2 tablets (100 mg total) by mouth every 12 (twelve) hours as needed.    Dispense:  120 tablet    Refill:  0  . denosumab (PROLIA) injection 60 mg    Sig:    [name], MD [place]

## 2015-10-01 NOTE — Assessment & Plan Note (Signed)
Discussed diagnosis of osteoporosis based on DEXA scan. Patient with reflux and thus is not a good candidate for oral bisphosphonates. We'll proceed with prolia. This will be ordered for the patient. She'll be called when this is available. She will need to return for a calcium check prior to receiving this. She will start on vitamin D supplementation and calcium.

## 2015-10-01 NOTE — Patient Instructions (Signed)
Nice to see you. We will refer you to rheumatology for further evaluation. You can take tramadol 100 mg twice daily as needed for pain. She can also supplement this with Tylenol. You should start calcium supplementation in addition to vitamin D supplementation. We will order your prolia and call when it is available. We will need to check your calcium level prior to giving this to you. If you develop worsening pain, numbness, weakness, loss of bowel or bladder function, numbness between her legs, or any new or changing symptoms please seek medical attention

## 2015-10-01 NOTE — Assessment & Plan Note (Signed)
Chronic intermittent issue. Benign exam today. Neurologically intact. No red flags. She'll continue to monitor. Given return precautions.

## 2015-10-05 ENCOUNTER — Encounter: Payer: No Typology Code available for payment source | Admitting: Gastroenterology

## 2015-10-26 NOTE — Telephone Encounter (Signed)
I have rec'd a letter from [employer] in ref to Valerie Forbes's insurance verification and her medical insurance benefits does not cover Prolia.  She may want to check w/her pharmacy benefits to see if they will cover.  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  Please let me know if you need one of the pamphlets.   I have sent a copy of the denial letter to be scanned into pt's chart.  If you have any questions, please let me know.  Thank you.

## 2015-10-26 NOTE — Telephone Encounter (Signed)
Yes can you send a pamphlet to our office for me.  So I have it for her to reference if needed. thanks

## 2015-10-28 ENCOUNTER — Ambulatory Visit (INDEPENDENT_AMBULATORY_CARE_PROVIDER_SITE_OTHER): Payer: No Typology Code available for payment source | Admitting: Family Medicine

## 2015-10-28 ENCOUNTER — Encounter: Payer: Self-pay | Admitting: Family Medicine

## 2015-10-28 VITALS — BP 128/72 | HR 98 | Temp 98.3°F | Ht 66.43 in | Wt 164.5 lb

## 2015-10-28 DIAGNOSIS — M545 Low back pain, unspecified: Secondary | ICD-10-CM

## 2015-10-28 MED ORDER — CYCLOBENZAPRINE HCL 10 MG PO TABS: 10.0000 mg | ORAL_TABLET | Freq: Three times a day (TID) | ORAL | Status: AC | PRN

## 2015-10-28 NOTE — Patient Instructions (Signed)
This is muscular in nature.   Use the flexeril as prescribed for your pain.  Be sure to take it easy and rest.  Follow up with Dr. Birdie SonsSonnenberg if you fail to improve or worsen.  Take care  Dr. Adriana Simasook

## 2015-10-28 NOTE — Progress Notes (Signed)
   Subjective:  Patient ID: Valerie Forbes, female    DOB: August 25, 1960  Age: 55 y.o. MRN: [id number]  CC: Low back/flank pain  HPI:  55 year old female with a history of low back pain presents with the above complaint.  Patient states that early this morning (3 am) she was awakened out of sleep with right low back/flank pain. Pain is severe. Worse with deep breathing. No known relieving factors. She states that she had a low-grade fever this morning (she is afebrile here). No associated nausea or vomiting. Questionable associated abdominal pain. She reports associated tiredness and weakness. No difficulty with urination (dysuria, urgency, frequency). No other complaints at this time.  Social Hx   Social History   Social History  . Marital Status: Married    Spouse Name: N/A  . Number of Children: N/A  . Years of Education: N/A   Social History Main Topics  . Smoking status: Smoker, Current Status Unknown  . Smokeless tobacco: None  . Alcohol Use: None  . Drug Use: None  . Sexual Activity: Not Asked   Other Topics Concern  . None   Social History Narrative   Review of Systems  Gastrointestinal: Negative for nausea and vomiting.  Musculoskeletal: Positive for back pain.   Objective:  BP 128/72 mmHg  Pulse 98  Temp(Src) 98.3 F (36.8 C) (Oral)  Ht 5' 6.43" (1.687 m)  Wt 164 lb 8 oz (74.617 kg)  BMI 26.22 kg/m2  SpO2 98%  BP/Weight 10/28/2015 10/01/2015 07/21/2015  Systolic BP 128 128 128  Diastolic BP 72 68 86  Wt. (Lbs) 164.5 169.8 158.12  BMI 26.22 27.06 25.2   Physical Exam  Constitutional: She appears well-developed.  Appears in pain but in no acute distress.  Cardiovascular: Regular rhythm.   No murmur heard. Pulmonary/Chest: Effort normal and breath sounds normal. No respiratory distress. She has no wheezes. She has no rales.  Abdominal: Soft.  Nondistended. No areas of tenderness on exam. No rebound or guarding.  Musculoskeletal:  Low back - discrete  area of tenderness in the paraspinal region on the right  Neurological: She is alert.  Vitals reviewed.  Lab Results  Component Value Date   WBC 9.3 05/14/2015   HGB 14.8 05/14/2015   HCT 44.1 05/14/2015   PLT 260.0 05/14/2015   GLUCOSE 91 05/14/2015   ALT 8 05/14/2015   AST 14 05/14/2015   NA 139 05/14/2015   K 4.2 05/14/2015   CL 106 05/14/2015   CREATININE 0.67 05/14/2015   BUN 17 05/14/2015   CO2 29 05/14/2015   TSH 0.61 05/28/2015   HGBA1C 5.2 05/14/2015   Assessment & Plan:   Problem List Items Addressed This Visit    Low back pain - Primary    Established problem, worsening. Patient's physical exam is consistent with musculoskeletal low back pain. Abdominal exam normal. No evidence of pyelonephritis or kidney stone at this time. Treating with PRN Flexeril.      Relevant Medications   cyclobenzaprine (FLEXERIL) 10 MG tablet      Meds ordered this encounter  Medications  . cyclobenzaprine (FLEXERIL) 10 MG tablet    Sig: Take 1 tablet (10 mg total) by mouth 3 (three) times daily as needed for muscle spasms.    Dispense:  30 tablet    Refill:  0   Follow-up: PRN  Everlene OtherJayce Heike Pounds DO [facility] [place]

## 2015-10-28 NOTE — Progress Notes (Signed)
Pre visit review using our clinic review tool, if applicable. No additional management support is needed unless otherwise documented below in the visit note. 

## 2015-10-28 NOTE — Assessment & Plan Note (Signed)
Established problem, worsening. Patient's physical exam is consistent with musculoskeletal low back pain. Abdominal exam normal. No evidence of pyelonephritis or kidney stone at this time. Treating with PRN Flexeril.

## 2015-11-11 ENCOUNTER — Other Ambulatory Visit: Payer: Self-pay | Admitting: Family Medicine

## 2015-11-12 NOTE — Telephone Encounter (Signed)
Please advise on refill.

## 2015-11-12 NOTE — Telephone Encounter (Signed)
RX faxed to pharmacy.

## 2015-12-06 ENCOUNTER — Other Ambulatory Visit: Payer: Self-pay | Admitting: Family Medicine

## 2015-12-15 ENCOUNTER — Other Ambulatory Visit: Payer: Self-pay | Admitting: Family Medicine

## 2015-12-15 NOTE — Telephone Encounter (Signed)
Patient needs follow-up scheduled for sometime in the next 1-2 months for future refills.

## 2015-12-15 NOTE — Telephone Encounter (Signed)
Please advise on refill.

## 2015-12-16 ENCOUNTER — Other Ambulatory Visit: Payer: Self-pay | Admitting: Family Medicine

## 2015-12-24 ENCOUNTER — Ambulatory Visit: Payer: No Typology Code available for payment source | Admitting: Family Medicine

## 2015-12-27 ENCOUNTER — Other Ambulatory Visit: Payer: Self-pay | Admitting: Family Medicine

## 2015-12-27 ENCOUNTER — Telehealth: Payer: Self-pay | Admitting: *Deleted

## 2015-12-27 MED ORDER — METOPROLOL TARTRATE 25 MG PO TABS: 25.0000 mg | ORAL_TABLET | Freq: Two times a day (BID) | ORAL | Status: AC

## 2015-12-27 NOTE — Telephone Encounter (Signed)
Patient has requested a medication refill for tramedol and metoprolol Pharmacy [facility] on [address]

## 2015-12-27 NOTE — Telephone Encounter (Signed)
tramedol was refilled on 12/16/15 by Dr.Sonneberg.

## 2015-12-27 NOTE — Telephone Encounter (Signed)
Metoprolol was refilled for a year.

## 2016-02-02 ENCOUNTER — Other Ambulatory Visit: Payer: Self-pay | Admitting: Family Medicine

## 2016-02-03 ENCOUNTER — Encounter: Payer: Self-pay | Admitting: Family Medicine

## 2016-02-03 ENCOUNTER — Ambulatory Visit (INDEPENDENT_AMBULATORY_CARE_PROVIDER_SITE_OTHER): Payer: BLUE CROSS/BLUE SHIELD | Admitting: Family Medicine

## 2016-02-03 VITALS — BP 130/84 | HR 70 | Temp 98.2°F | Ht 66.43 in | Wt 159.2 lb

## 2016-02-03 DIAGNOSIS — M810 Age-related osteoporosis without current pathological fracture: Secondary | ICD-10-CM | POA: Diagnosis not present

## 2016-02-03 DIAGNOSIS — M79642 Pain in left hand: Secondary | ICD-10-CM

## 2016-02-03 DIAGNOSIS — I10 Essential (primary) hypertension: Secondary | ICD-10-CM | POA: Diagnosis not present

## 2016-02-03 DIAGNOSIS — R609 Edema, unspecified: Secondary | ICD-10-CM | POA: Diagnosis not present

## 2016-02-03 DIAGNOSIS — M79641 Pain in right hand: Secondary | ICD-10-CM

## 2016-02-03 DIAGNOSIS — M25473 Effusion, unspecified ankle: Secondary | ICD-10-CM | POA: Insufficient documentation

## 2016-02-03 MED ORDER — TRAMADOL HCL 50 MG PO TABS: ORAL_TABLET | ORAL | Status: DC

## 2016-02-03 MED ORDER — AMLODIPINE BESYLATE 5 MG PO TABS: 5.0000 mg | ORAL_TABLET | Freq: Every day | ORAL | Status: AC

## 2016-02-03 NOTE — Assessment & Plan Note (Signed)
At goal today without any medication. Discussed continuing medication versus monitoring off of medication. Left up the patient as she was unsure what she wanted to do. Refilled amlodipine. Advised that if she continues to take the amlodipine she needs to check her blood pressure several times a week and record this. If she does not take the amlodipine she is to record her blood pressure every day and start the medicine if she is persistently greater than  140/90. She will return this record in 2 weeks.

## 2016-02-03 NOTE — Assessment & Plan Note (Signed)
Remains a poor candidate for bisphosphonates. We will have them attempt to order Prolia again given that she has new insurance. We'll await a determination on this. Advised starting calcium supplement. We will check vitamin D today.

## 2016-02-03 NOTE — Progress Notes (Signed)
Patient ID: Valerie Forbes, female   DOB: 1961/03/03, 55 y.o.   MRN: [id number]  [name], MD Phone: [phone]  Valerie Forbes is a 55 y.o. female who presents today for f/u.  HYPERTENSION Disease Monitoring Home BP Monitoring not checking Chest pain- no    Dyspnea- no Medications Compliance-  Has been out of amlodipine for 1 week.  Edema- yes, notes intermittent bilateral lower extremity swelling at her ankles and feet right greater than left for several years. No orthopnea or PND. Hemoglobin most recently was not anemic. No kidney or liver dysfunction on recent check. No recent TSH. Does somewhat improve with propping her legs up.  Osteoporosis: did not hear about prolia. Has been taking vitamin D 50,000 units weekly. Not currently taking calcium. Bisphosphonates remain a poor option for her given her history of reflux.  Chronic hand pain: Patient notes not as bad this time of year with warm weather. Notes joints and wrists ache some. Did see rheumatology and had significant workup that was negative. Takes tramadol 2 tablets daily typically though sometimes up to 4 tablets as prescribed. Notes this is helpful.   PMH: Smoker   ROS see history of present illness  Objective  Physical Exam Filed Vitals:   02/03/16 1553  BP: 130/84  Pulse: 70  Temp: 98.2 F (36.8 C)    BP Readings from Last 3 Encounters:  02/03/16 130/84  10/28/15 128/72  10/01/15 128/68   Wt Readings from Last 3 Encounters:  02/03/16 159 lb 3.2 oz (72.213 kg)  10/28/15 164 lb 8 oz (74.617 kg)  10/01/15 169 lb 12.8 oz (77.021 kg)    Physical Exam  Constitutional: No distress.  HENT:  Head: Normocephalic and atraumatic.  Cardiovascular: Normal rate, regular rhythm and normal heart sounds.   Pulmonary/Chest: Effort normal and breath sounds normal.  Musculoskeletal: She exhibits no edema.  Bilateral hands with no joint swelling or erythema, mild discomfort on palpation of joints of  most of her fingers, wrist with no swelling or erythema or tenderness  Neurological: She is alert. Gait normal.  Skin: Skin is warm and dry. She is not diaphoretic.     Assessment/Plan: Please see individual problem list.  Essential hypertension At goal today without any medication. Discussed continuing medication versus monitoring off of medication. Left up the patient as she was unsure what she wanted to do. Refilled amlodipine. Advised that if she continues to take the amlodipine she needs to check her blood pressure several times a week and record this. If she does not take the amlodipine she is to record her blood pressure every day and start the medicine if she is persistently greater than  140/90. She will return this record in 2 weeks.  Bilateral hand pain Suspect osteoarthritis as cause of her hand pain. Has seen rheumatology and she is unsure if she is going to return to them as they did not find any significant findings on workup. I have refilled her tramadol. She'll continue to monitor.  Osteoporosis Remains a poor candidate for bisphosphonates. We will have them attempt to order Prolia again given that she has new insurance. We'll await a determination on this. Advised starting calcium supplement. We will check vitamin D today.  Ankle edema Suspect venous insufficiency. No symptoms of CHF. No history of anemia or kidney or liver dysfunction on recent lab work. We'll check a TSH to rule out thyroid dysfunction. If negative would advise her to keep her legs propped up as much as  possible and consider compression stockings.    Orders Placed This Encounter  Procedures  . Vitamin D (25 hydroxy)  . TSH    Meds ordered this encounter  Medications  . amLODipine (NORVASC) 5 MG tablet    Sig: Take 1 tablet (5 mg total) by mouth daily.    Dispense:  60 tablet    Refill:  3  . traMADol (ULTRAM) 50 MG tablet    Sig: TAKE TWO TABLETS BY MOUTH EVERY 12 HOURS AS NEEDED    Dispense:   120 tablet    Refill:  0     Marikay AlarEric Sonnenberg, MD [facility] [place]

## 2016-02-03 NOTE — Patient Instructions (Addendum)
Nice to see you. We will reorder your Prolia. We will refill your amlodipine. Please check your blood pressure several times a week and bring this in to us in 2 weeks. We will check labs today and call with results. If you develop chest pain or shortness of breath, increased swelling, or any new or changing symptoms please seek medical attention.

## 2016-02-03 NOTE — Progress Notes (Signed)
Pre visit review using our clinic review tool, if applicable. No additional management support is needed unless otherwise documented below in the visit note. 

## 2016-02-03 NOTE — Assessment & Plan Note (Signed)
Suspect venous insufficiency. No symptoms of CHF. No history of anemia or kidney or liver dysfunction on recent lab work. We'll check a TSH to rule out thyroid dysfunction. If negative would advise her to keep her legs propped up as much as possible and consider compression stockings.

## 2016-02-03 NOTE — Assessment & Plan Note (Signed)
Suspect osteoarthritis as cause of her hand pain. Has seen rheumatology and she is unsure if she is going to return to them as they did not find any significant findings on workup. I have refilled her tramadol. She'll continue to monitor.

## 2016-02-04 LAB — VITAMIN D 25 HYDROXY (VIT D DEFICIENCY, FRACTURES): VITD: 57.02 ng/mL (ref 30.00–100.00)

## 2016-02-04 LAB — TSH: TSH: 0.36 u[IU]/mL (ref 0.35–4.50)

## 2016-03-09 ENCOUNTER — Telehealth: Payer: Self-pay | Admitting: Family Medicine

## 2016-03-09 NOTE — Telephone Encounter (Signed)
Form was faxed back on Wednesday.  Spoke with Valerie Forbes, they have not received, but he believes it will appear tomorrow.

## 2016-03-09 NOTE — Telephone Encounter (Signed)
[phone] [phone] called from [employer] regarding Prior auth form for Prolia it was faxed over on 08/15. Fax to  [phone]. Thank you!

## 2016-03-15 ENCOUNTER — Other Ambulatory Visit: Payer: Self-pay | Admitting: Family Medicine

## 2016-04-12 ENCOUNTER — Other Ambulatory Visit: Payer: Self-pay | Admitting: Family Medicine

## 2016-04-12 NOTE — Telephone Encounter (Signed)
Refill given

## 2016-04-12 NOTE — Telephone Encounter (Signed)
Last OV with PCP was 7/17 tramadol last filled, ok to fill?

## 2016-04-13 NOTE — Telephone Encounter (Signed)
RX faxed

## 2016-05-11 ENCOUNTER — Other Ambulatory Visit: Payer: Self-pay | Admitting: Family Medicine

## 2016-05-24 ENCOUNTER — Ambulatory Visit: Payer: BLUE CROSS/BLUE SHIELD | Admitting: Family Medicine

## 2018-03-05 ENCOUNTER — Other Ambulatory Visit (HOSPITAL_COMMUNITY): Payer: Self-pay | Admitting: FAMILY PRACTICE

## 2018-03-05 DIAGNOSIS — R0600 Dyspnea, unspecified: Secondary | ICD-10-CM

## 2018-03-05 DIAGNOSIS — R011 Cardiac murmur, unspecified: Secondary | ICD-10-CM

## 2018-03-14 ENCOUNTER — Ambulatory Visit
Admission: RE | Admit: 2018-03-14 | Discharge: 2018-03-14 | Disposition: A | Discharge status: Home / Self Care | Payer: PPO | Source: Ambulatory Visit | Attending: FAMILY PRACTICE | Admitting: FAMILY PRACTICE

## 2018-03-14 DIAGNOSIS — R011 Cardiac murmur, unspecified: Secondary | ICD-10-CM

## 2018-03-14 DIAGNOSIS — R0600 Dyspnea, unspecified: Secondary | ICD-10-CM

## 2018-03-14 LAB — TRANSTHORACIC ECHOCARDIOGRAM - ADULT
AV LVOT peak gradient: 6 mmHg
AV mean gradient: 5 mmHg
Ao VTI: 33.5 cm
Ao peak vel: 152 cm/s
Aortic Valve Area by Continuity of Peak Velocity: 3.39 cm2
Aortic Valve Area by Continuity of VTI: 3.42 cm2
Ascending aorta: 2.8 cm
E wave decelartion time: 198 ms
E/A ratio: 1
E/E' ratio: 14.1
Inferior Vena Cava Diameter: 1.06 cm
Inferior Vena Cava Diameter: 1.08 cm
Inferior Vena Cava Diameter: 1.1 cm
LA Volume Index: 30.2 mL/m2
LA size: 2.8 cm
LA size: 2.8 cm
LA volume: 57 cm3
LVOT diameter: 2.3 cm
LVOT stroke volume: 115 cm3
Left Ventricular Outflow Tract Peak Velocity: 124 cm/s
MV Comp VTI: 31.6 cm
MV Peak A Vel: 77.6 cm/s
MV Peak E Vel: 80 cm/s
MV mean gradient: 2 mmHg
MV stenosis pressure 1/2 time: 4 ms
Mitral Valve Max Velocity: 94.8 cm/s
Mr max vel: 382 cm/s
PISA AR VN Nyquist: 204 cm/s
PV mean gradient: 2 mmHg
PV peak gradient: 3 mmHg
Pulmonic Valve Max Velocity: 81.8 cm/s
Pulmonic Valve Systolic Velocity Time Integral: 19 cm
RVDD: 4.01 cm
RVDD: 4.01 cm
TDI: 5.66 cm/s
TDI: 5.66 cm/s
Tricuspid Valve Regurgitation Velocity Time Interval: 79.4 cm

## 2018-03-29 ENCOUNTER — Emergency Department
Admission: EM | Admit: 2018-03-29 | Discharge: 2018-03-29 | Disposition: A | Discharge status: Home / Self Care | Payer: PPO | Attending: Emergency Medicine | Admitting: Emergency Medicine

## 2018-03-29 ENCOUNTER — Encounter (HOSPITAL_COMMUNITY): Payer: Self-pay

## 2018-03-29 ENCOUNTER — Emergency Department (HOSPITAL_COMMUNITY): Payer: PPO

## 2018-03-29 ENCOUNTER — Other Ambulatory Visit: Payer: Self-pay

## 2018-03-29 DIAGNOSIS — R079 Chest pain, unspecified: Secondary | ICD-10-CM | POA: Insufficient documentation

## 2018-03-29 DIAGNOSIS — I10 Essential (primary) hypertension: Secondary | ICD-10-CM | POA: Insufficient documentation

## 2018-03-29 DIAGNOSIS — M940 Chondrocostal junction syndrome [Tietze]: Secondary | ICD-10-CM | POA: Insufficient documentation

## 2018-03-29 DIAGNOSIS — F17200 Nicotine dependence, unspecified, uncomplicated: Secondary | ICD-10-CM | POA: Insufficient documentation

## 2018-03-29 HISTORY — DX: Hyperlipidemia, unspecified: E78.5

## 2018-03-29 HISTORY — DX: Arthropathy, unspecified: M12.9

## 2018-03-29 HISTORY — DX: Essential (primary) hypertension: I10

## 2018-03-29 LAB — CBC WITH DIFF
BASOPHIL #: 0.06 x10ˆ3/uL (ref <=0.20)
BASOPHIL %: 1 %
EOSINOPHIL #: 0.22 x10ˆ3/uL (ref <=0.50)
EOSINOPHIL %: 3 %
HCT: 46.6 % — ABNORMAL HIGH (ref 34.8–46.0)
HGB: 15.5 g/dL (ref 11.5–16.0)
IMMATURE GRANULOCYTE #: <0.04 x10ˆ3/uL (ref <0.10)
IMMATURE GRANULOCYTE %: 0 % (ref 0–1)
LYMPHOCYTE #: 2.76 10*3/uL (ref 1.00–4.80)
LYMPHOCYTE %: 32 %
MCH: 29.1 pg (ref 26.0–32.0)
MCHC: 33.3 g/dL (ref 31.0–35.5)
MCV: 87.6 fL (ref 78.0–100.0)
MONOCYTE #: 0.74 x10ˆ3/uL (ref 0.20–1.10)
MONOCYTE %: 8 %
MPV: 10.1 fL (ref 8.7–12.5)
NEUTROPHIL #: 4.97 x10ˆ3/uL (ref 1.50–7.70)
NEUTROPHIL %: 56 %
PLATELETS: 242 x10ˆ3/uL (ref 150–400)
RBC: 5.32 x10ˆ6/uL — ABNORMAL HIGH (ref 3.85–5.22)
RDW-CV: 13.1 % (ref 11.5–15.5)
WBC: 8.8 x10ˆ3/uL (ref 3.7–11.0)

## 2018-03-29 LAB — PT/INR
INR: 0.97 (ref <=4.50)
PROTHROMBIN TIME: 11.1 s (ref 9.4–12.5)

## 2018-03-29 LAB — COMPREHENSIVE METABOLIC PANEL, NON-FASTING
ALBUMIN: 4.6 g/dL (ref 3.5–5.0)
ALKALINE PHOSPHATASE: 73 U/L (ref 38–126)
ALT (SGPT): 14 U/L (ref <=35)
ANION GAP: 8 mmol/L — ABNORMAL LOW (ref 10–20)
AST (SGOT): 23 U/L (ref 14–36)
BILIRUBIN TOTAL: 0.4 mg/dL (ref 0.3–1.3)
BUN/CREA RATIO: 17
BUN: 12 mg/dL (ref 8–25)
CALCIUM: 10.1 mg/dL (ref 8.5–10.4)
CHLORIDE: 106 mmol/L (ref 98–107)
CO2 TOTAL: 26 mmol/L (ref 22–32)
CREATININE: 0.7 mg/dL (ref 0.49–1.10)
ESTIMATED GFR: 86 mL/min/1.73mˆ2
GLUCOSE: 97 mg/dL (ref 70–105)
POTASSIUM: 3.8 mmol/L (ref 3.5–5.1)
PROTEIN TOTAL: 7.7 g/dL (ref 6.0–8.0)
SODIUM: 140 mmol/L (ref 137–145)

## 2018-03-29 LAB — TROPONIN-I: TROPONIN I: <=0.01 ng/mL (ref <=0.03)

## 2018-03-29 LAB — D-DIMER: D-DIMER: <200 ng/mL DDU (ref <200)

## 2018-03-29 LAB — CREATINE KINASE (CK), TOTAL, SERUM OR PLASMA: CREATINE KINASE: 59 U/L (ref 25–190)

## 2018-03-29 LAB — PTT (PARTIAL THROMBOPLASTIN TIME): APTT: 34.6 s (ref 25.1–36.5)

## 2018-03-29 LAB — GRAY TOP TUBE

## 2018-03-29 LAB — GOLD TOP TUBE

## 2018-03-29 MED ORDER — SODIUM CHLORIDE 0.9 % (FLUSH) INJECTION SYRINGE
3.00 mL | INJECTION | Freq: Three times a day (TID) | INTRAMUSCULAR | Status: DC
Start: 2018-03-29 — End: 2018-03-29

## 2018-03-29 MED ORDER — ORPHENADRINE CITRATE ER 100 MG TABLET,EXTENDED RELEASE
100.00 mg | ORAL_TABLET | ORAL | Status: AC
Start: 2018-03-29 — End: 2018-03-29
  Administered 2018-03-29: 100 mg via ORAL
  Filled 2018-03-29: qty 1

## 2018-03-29 MED ORDER — IBUPROFEN 600 MG TABLET
600.00 mg | ORAL_TABLET | Freq: Three times a day (TID) | ORAL | 0 refills | Status: AC | PRN
Start: 2018-03-29 — End: 2018-04-05

## 2018-03-29 MED ORDER — KETOROLAC 30 MG/ML (1 ML) INJECTION SOLUTION
30.00 mg | INTRAMUSCULAR | Status: DC
Start: 2018-03-29 — End: 2018-03-29
  Administered 2018-03-29: 0 mg via INTRAVENOUS

## 2018-03-29 MED ORDER — SODIUM CHLORIDE 0.9 % (FLUSH) INJECTION SYRINGE
3.00 mL | INJECTION | INTRAMUSCULAR | Status: DC | PRN
Start: 2018-03-29 — End: 2018-03-29

## 2018-03-29 MED ORDER — CYCLOBENZAPRINE 5 MG TABLET: 5 mg | Tab | Freq: Three times a day (TID) | ORAL | 0 refills | 0 days | Status: AC | PRN

## 2018-03-29 NOTE — ED Triage Notes (Signed)
Patient reports a family history of Ehlers- Danlos Syndrome.

## 2018-03-29 NOTE — ED Nurses Note (Signed)
Patient states that she is having extreme chest pain, worse with inspiration. States that she has been hypertensive, states that she has had dizziness and a headache, was told to go to ED by urgent care 2-3 weeks ago when BP was elevated and she did not go. Patient denies any headache or dizziness at this time. CNA doing manual BP to verify BP.  Jamison Neighbor, RN  03/29/2018, 19:37

## 2018-03-29 NOTE — ED Provider Notes (Signed)
[facility]  EMERGENCY DEPARTMENT HISTORY AND PHYSICAL      Chief Complaint:    Patient is a 56 y.o.  female presenting to the ED with chief complaint of chest pain     History of Present Illness:    Patient presents ambulatory to the emergency department with complaint of chest wall pain worse with deep inspiration and movement.  Patient stated that the pain is sharp and started early this morning before she went to work and became worse at work and was told by her supervisor to go to an urgent care which she did.  Patient states that an EKG was done and she was told to go to the emergency department for further evaluation.  Patient denies any diaphoresis, palpitation and shortness of breath.  Patient denies any nausea vomiting.    Review of Systems:    Constitutional: No fever, chills.  Respiratory:  Patient denies any shortness of breath.  Chest:  Patient admits to chest pain.  Abdomen:  Patient denies any abdominal pain.  Skin: No rashes or lesions  MSK: No joint pain.  No neck or back pain  Neuro: No numbness, tingling, or weakness.  Psych: No SI or HI. Normal mood  All other systems were reviewed and were negative except for what is mentioned in the HPI.    Past Medical History:  Past Medical History:   Diagnosis Date   . Arthropathy    . HTN (hypertension)    . Hyperlipemia      History reviewed. No pertinent surgical history.    Above history reviewed with patient.  Allergies, medication list, and old records also reviewed.     There is no problem list on file for this patient.      Social History:    Social History     Tobacco Use   . Smoking status: Current Every Day Smoker   . Smokeless tobacco: Never Used   Substance Use Topics   . Alcohol use: Never     Frequency: Never   . Drug use: Never         Filed Vitals:    03/29/18 1900 03/29/18 1915 03/29/18 1932 03/29/18 1941   BP:   (!) 216/119 (!) 180/100   Pulse: 55 56 65    Resp: 20 17 (!) 21    SpO2: 93% 95% 93%               Physical Exam:     Nursing note and vitals reviewed.  Vital signs reviewed as above. No acute distress.   Constitutional: Pt is well-developed and well-nourished.   Head: Normocephalic and atraumatic.   Eyes: Conjunctivae are normal. Pupils are equal, round, and reactive to light. EOM are intact  Ears: TM's clear bilaterally  Mouth: Moist, without erythema  Neck: Soft, supple, full range of motion.  Heart: Normal rate and rhythm.  Pulmonary/Chest: No respiratory distress.  Reproducible chest pain with palpation on the anterior left chest wall.  Abdomen: Soft, active bowel sounds  Musculoskeletal: Normal range of motion. No deformities.   Neurological: No focal deficits noted.  Skin: No rash or lesions  Psychiatric: Patient has a normal mood and affect.       Labs:      Results for orders placed or performed during the hospital encounter of 03/29/18   COMPREHENSIVE METABOLIC PANEL, NON-FASTING   Result Value Ref Range    SODIUM 140 137 - 145 mmol/L  POTASSIUM 3.8 3.5 - 5.1 mmol/L    CHLORIDE 106 98 - 107 mmol/L    CO2 TOTAL 26 22 - 32 mmol/L    ANION GAP 8 (L) 10 - 20 mmol/L    BUN 12 8 - 25 mg/dL    CREATININE 0.70 0.49 - 1.10 mg/dL    BUN/CREA RATIO 17     ESTIMATED GFR 86 mL/min/1.26m^2    ALBUMIN 4.6 3.5 - 5.0 g/dL    CALCIUM 10.1 8.5 - 10.4 mg/dL    GLUCOSE 97 70 - 105 mg/dL    ALKALINE PHOSPHATASE 73 38 - 126 U/L    ALT (SGPT) 14 <=35 U/L    AST (SGOT) 23 14 - 36 U/L    BILIRUBIN TOTAL 0.4 0.3 - 1.3 mg/dL    PROTEIN TOTAL 7.7 6.0 - 8.0 g/dL   CREATINE KINASE (CK), TOTAL, SERUM   Result Value Ref Range    CREATINE KINASE 59 25 - 190 U/L   TROPONIN-I   Result Value Ref Range    TROPONIN I <=0.01 <=0.03 ng/mL   PT/INR   Result Value Ref Range    PROTHROMBIN TIME 11.1 9.4 - 12.5 seconds    INR 0.97 <=4.50   PTT (PARTIAL THROMBOPLASTIN TIME)   Result Value Ref Range    APTT 34.6 25.1 - 36.5 seconds   D-DIMER   Result Value Ref Range    D-DIMER <200 <200 ng/mL DDU   CBC WITH DIFF   Result Value Ref Range     WBC 8.8 3.7 - 11.0 x10^3/uL    RBC 5.32 (H) 3.85 - 5.22 x10^6/uL    HGB 15.5 11.5 - 16.0 g/dL    HCT 46.6 (H) 34.8 - 46.0 %    MCV 87.6 78.0 - 100.0 fL    MCH 29.1 26.0 - 32.0 pg    MCHC 33.3 31.0 - 35.5 g/dL    RDW-CV 13.1 11.5 - 15.5 %    PLATELETS 242 150 - 400 x10^3/uL    MPV 10.1 8.7 - 12.5 fL    NEUTROPHIL % 56 %    LYMPHOCYTE % 32 %    MONOCYTE % 8 %    EOSINOPHIL % 3 %    BASOPHIL % 1 %    NEUTROPHIL # 4.97 1.50 - 7.70 x10^3/uL    LYMPHOCYTE # 2.76 1.00 - 4.80 x10^3/uL    MONOCYTE # 0.74 0.20 - 1.10 x10^3/uL    EOSINOPHIL # 0.22 <=0.50 x10^3/uL    BASOPHIL # 0.06 <=0.20 x10^3/uL    IMMATURE GRANULOCYTE % 0 0 - 1 %    IMMATURE GRANULOCYTE # <0.04 <0.10 x10^3/uL       Imaging:         Orders Placed This Encounter   . XR AP MOBILE CHEST   . CBC/DIFF   . COMPREHENSIVE METABOLIC PANEL, NON-FASTING   . CREATINE KINASE (CK), TOTAL, SERUM   . TROPONIN-I   . PT/INR   . PTT (PARTIAL THROMBOPLASTIN TIME)   . D-DIMER   . CBC WITH DIFF   . GOLD TOP TUBE   . GRAY TOP TUBE   . ECG 12 LEAD   . CANCELED: ECG 12 LEAD   . INSERT & MAINTAIN PERIPHERAL IV ACCESS   . AND Linked Order Group    . NS flush syringe    . NS flush syringe   . ketorolac (TORADOL) 30 mg/mL injection   . orphenadrine citrate (NORFLEX) 12 hr extended release tablet   . Ibuprofen (MOTRIN)  600 mg Oral Tablet   . cyclobenzaprine (FLEXERIL) 5 mg Oral Tablet           MDM:           During the patient's stay in the emergency department, the above listed imaging and/or labs were performed to assist with medical decision making and were reviewed by myself as available for review.    Patient rechecked and remained stable throughout the emergency department course.      Results discussed with patient.    All questions/concerns addressed, and patient agrees with disposition plan.    Advised that patient return to ED immediately for any new or worsening symptoms; otherwise, follow up with PCP.    IMPRESSION:   Encounter Diagnoses   Name Primary?   . Chest pain,  unspecified type Yes   . Costochondritis            DISPOSITION/PLAN:    Discharged  Isac Caddy, MD  [address]  [place] [address]  [phone]    In 3 days  If symptoms worsen

## 2018-03-29 NOTE — ED Nurses Note (Signed)
Dr. Obie Dredge notified of elevated BP.  Jamison Neighbor, RN  03/29/2018, 19:42

## 2018-03-29 NOTE — ED Triage Notes (Signed)
Patient report that she woke up with severe chest pain at 0500. Patient reports that the pain was acute onset and occurs under the left breast. Denies radiation. Denies diaphoresis. Reports that the pain does not allow her to take a deep breath.

## 2018-03-29 NOTE — ED Nurses Note (Signed)
Patient verbalized understanding of all discharge instructions and rx along with follow up.  Jamison Neighbor, RN  03/29/2018, 21:06

## 2018-05-21 ENCOUNTER — Ambulatory Visit: Payer: PPO | Attending: Neurology

## 2018-05-21 ENCOUNTER — Encounter (INDEPENDENT_AMBULATORY_CARE_PROVIDER_SITE_OTHER): Payer: Self-pay

## 2018-05-21 VITALS — BP 138/104 | HR 68 | Ht 66.0 in | Wt 172.0 lb

## 2018-05-21 DIAGNOSIS — R531 Weakness: Secondary | ICD-10-CM | POA: Insufficient documentation

## 2018-05-21 DIAGNOSIS — R292 Abnormal reflex: Secondary | ICD-10-CM | POA: Insufficient documentation

## 2018-05-21 DIAGNOSIS — R200 Anesthesia of skin: Secondary | ICD-10-CM | POA: Insufficient documentation

## 2018-05-21 DIAGNOSIS — R51 Headache: Secondary | ICD-10-CM | POA: Insufficient documentation

## 2018-05-21 DIAGNOSIS — F17200 Nicotine dependence, unspecified, uncomplicated: Secondary | ICD-10-CM | POA: Insufficient documentation

## 2018-05-21 DIAGNOSIS — R42 Dizziness and giddiness: Secondary | ICD-10-CM | POA: Insufficient documentation

## 2018-05-21 DIAGNOSIS — R159 Full incontinence of feces: Secondary | ICD-10-CM | POA: Insufficient documentation

## 2018-05-21 DIAGNOSIS — R296 Repeated falls: Secondary | ICD-10-CM | POA: Insufficient documentation

## 2018-05-21 DIAGNOSIS — R4701 Aphasia: Secondary | ICD-10-CM | POA: Insufficient documentation

## 2018-05-21 NOTE — H&P (Signed)
[facility] Department of Neurology      Operated by [facility]  Outpatient History and Physical  Date:  05/21/2018  Name: [name]  MRN: [id number]  Age:  57 y.o.  Referring Physician:Graves, Marquette Old, MD  [place], [place] [address]    Consult: Yes    PCP:  Isac Caddy, MD    CC:  Right Arm Weakness; Right Leg Weakness; Numbness (foot knee elbow w/some pain ); Other (multiple falls); Slurred Speech; Balance Problems; Dizziness; Headache; and Aphasia      History Obtained from:  Patient and Family    HPI: 57 year old woman with history of HTn, HLD who presents to neurology clinic for the first time with concerns for right sided weakness that has gradually been getting worse.     reports that 4 years ago, was living in [place] and was tired all the time, progressively and slowly since then had onset of spasms and dragging of the R side of her body. Has impacted her ADLs, has led to falls, where her leg will give out or lock in a hyperextended position. Symptoms are continuous and constant. Occasional episodes where it will flare up but not noted with any other illness or symptoms. Day to day, worse when she is tired. No changes at all on the L side. Numbness on the R side as well, noted in the entire arm but intermittent. Also noted on the bottom of her heel all the time. And seems like its spreading to more area of her foot. Has mild pain in her knee and elbow.    No changes in face strength, asymmetry. Has not noticed a change in speech, but seems to be more slurred when she is tired. No trouble chewing, swallowing, breathing. Rarely will choke and usually just on saliva, no specific foods or liquids.     Reports urinary urgency, incontinence of bowel 3-5 times a week and this has been going on within the past year. Has history of IBS. Unsure of saddle anesthesia. Hx of back surgery in 1999 for lumbar disc rupture, no issues like this at that time.     No other reported history  of focal neuro deficits.     No workup has been done for this thus far including imaging or labs    PMHx of HTN, IBS.  Surghx: masses on R leg that were non-malignant, chole, appy, exlap for crohns (negative),     Fam hx: mother has cancer NHL, heart problems, daughter has chiari and EDS, unknown fathers history.     Past Medical History:    Past Medical History:   Diagnosis Date   . Arthropathy    . HTN (hypertension)    . Hyperlipemia            Medications:   Outpatient Medications Marked as Taking for the 05/21/18 encounter (Office Visit) with Jaynee Eagles, MD   Medication Sig   . diphenoxylate-atropine (LOMOTIL) 2.5-0.025 mg Oral Tablet Once per day as needed   . metoprolol tartrate (LOPRESSOR) 25 mg Oral Tablet Take 25 mg by mouth Twice daily   . ondansetron (ZOFRAN) 8 mg Oral Tablet Take 8 mg by mouth Twice per day as needed   . quinapril (ACCUPRIL) 40 mg Oral Tablet Take 40 mg by mouth Once a day       Allergies: No Known Allergies    Family History:   Family Medical History:  Problem Relation (Age of Onset)    Ehlers-Danlos syndrome Daughter    Heart Attack Mother    Lymphoma Mother              Surgical History:   Past Surgical History:   Procedure Laterality Date   . HX APPENDECTOMY     . HX LAP CHOLECYSTECTOMY     . HX LUMBAR DISKECTOMY  1999             Social History:    Social History     Socioeconomic History   . Marital status: Divorced     Spouse name: Not on file   . Number of children: Not on file   . Years of education: Not on file   . Highest education level: Not on file   Tobacco Use   . Smoking status: Current Every Day Smoker   . Smokeless tobacco: Never Used   Substance and Sexual Activity   . Alcohol use: Never     Frequency: Never   . Drug use: Never       Review of Systems  Constitutional-No fever  Eyes- No visual change  ENT- Hearing normal  CV- No chest pain  Resp- No Shortness of breath  GI- No diarrhea  GU- Bladder normal  MS- No Arthritis  Skin- No rash  Psych- No  depression  Endo- No DM  Heme- No nodes    PHYSICAL EXAM:    BP (!) 138/104   Pulse 68   Ht 1.676 m (5\' 6" )   Wt 78 kg (171 lb 15.3 oz)   BMI 27.75 kg/m         Appearance:No Acute Distress  Ophthalmoscopic: Disc Flat, Normal fundus  Carotid/Heart/Peripheral Vascular: No Bruits, RRR  Orientation: Awake, Alert, and Oriented x 3  Mental status:  Memory: Registation 3/3 Recall 3/3  Attention: Normal  Knowledge: Appropriate  Language: No aphasia  Speech: No dysarthria  Cranial Nerves:  2 No Visual Defect on Confrontation; Pupils round, equal, reactive tolight  3,4,6 baseline impaired R eye abduction, all other EOMs intact. No nystagmus  5 Facial Sensation Intact  7 No facial asymmetry  8 Intact hearing  9,10 Palate symmetric, normal gag  11 Good shoulder shrug  12 Tongue Midline  Gait: mildly unsteady but able to walk without assist. High steppage on R with foot drop. No ataxia, can perform tandem walking  Coordination: difficulty with FNF in R hand, not outside expected given weakness. No ataxia with finger to nose testing and heel to shin testing. No clonus  Sensory: Intact, Symmetric to Pinprick, Light Touch,Vibration, and Joint Position  Muscle Tone: Normal throughout  Muscle exam  Arm Right Left Leg Right Left   Deltoid 4/5 5/5 Iliopsoas 4/5 5/5   Biceps 4/5 5/5 Quads 5/5 5/5   Triceps 4/5 5/5 Hamstrings 4/5 5/5   Wrist Extension 4/5 5/5 Ankle Dorsi Flexion 4/5 5/5   Wrist Flexion 4/5 5/5 Ankle Plantar Flexion 4/5 5/5   Interossei 4/5 5/5 Ankle Eversion 4/5 5/5   APB 4/5 5/5 Ankle Inversion 5/5 5/5       Reflexes   RJ BJ TJ KJ AJ Plantars Hoffman's   Right 3+ 3+ 3+ 3+ 3+ Upgoing Present   Left 2+ 2+ 2+ 2+ 2+ Downgoing Not present     Personal review of  Diabetes Monitors  A1C - Glucose - Lipids Microalbumin   No results for input(s): HA1C, GLUCOSEFAST, CHOLESTEROL, HDLCHOL, LDLCHOL, LDLCHOLDIR, TRIG in the last 13140  hours. No results for input(s): MICALBRNUR, MICALBCRERAT in the last 13140 hours.     Diabetic  foot exam:  Both feet without edema or ulcerations. Pulses normal bilaterally. Sensation normal bilaterally                   Outside records: reviewed    Assessment/Plan:   No diagnosis found.  No orders of the defined types were placed in this encounter.    R side weakness  57 year old female with no pertinent medical history who presents with 4 year onset of gradual progressive R arm and leg numbness and weakness. Exam noting UMN signs including hyperreflexia, + Hoffman's, Babinski, and with recent bowel incontinence. Concern at this time for a lesion within the cervical spinal cord, also with consideration for intracranial involvement as well  - MRI brain w/wo contrast  - MRI C spine w/wo contrast  - advised to call with any further questions, worsening in the meantime.  - RTC 3 months      Jaynee Eagles, MD 05/21/2018, 14:04    05/21/2018  I saw and examined the patient.  I reviewed the resident's note.  I agree with the findings and plan of care as documented in the resident's note.  Any exceptions/additions are noted.    Hyperreflexic on right. She sounds mildly dysarthric but family says this is her normal speech. It has been progressive for 2 years so will MRI brain and cervical spine to assess for a mass lesion or old stroke.     Candie Chroman, MD 05/21/2018, 15:30

## 2018-06-08 ENCOUNTER — Ambulatory Visit
Admission: RE | Admit: 2018-06-08 | Discharge: 2018-06-08 | Disposition: A | Discharge status: Home / Self Care | Payer: PPO | Source: Ambulatory Visit | Attending: Neurology | Admitting: Neurology

## 2018-06-08 ENCOUNTER — Inpatient Hospital Stay
Admission: EM | Admit: 2018-06-08 | Discharge: 2018-06-09 | DRG: 055 | Disposition: A | Discharge status: Home / Self Care | Payer: PPO | Source: Ambulatory Visit | Attending: Neurological Surgery | Admitting: Neurological Surgery

## 2018-06-08 ENCOUNTER — Ambulatory Visit (INDEPENDENT_AMBULATORY_CARE_PROVIDER_SITE_OTHER)
Admission: RE | Admit: 2018-06-08 | Discharge: 2018-06-08 | Disposition: A | Discharge status: Home / Self Care | Payer: PPO | Source: Ambulatory Visit

## 2018-06-08 ENCOUNTER — Other Ambulatory Visit: Payer: Self-pay

## 2018-06-08 ENCOUNTER — Encounter (HOSPITAL_COMMUNITY): Payer: Self-pay

## 2018-06-08 ENCOUNTER — Emergency Department (HOSPITAL_COMMUNITY): Payer: PPO

## 2018-06-08 ENCOUNTER — Emergency Department (EMERGENCY_DEPARTMENT_HOSPITAL)

## 2018-06-08 ENCOUNTER — Emergency Department (EMERGENCY_DEPARTMENT_HOSPITAL): Payer: PPO

## 2018-06-08 ENCOUNTER — Inpatient Hospital Stay (HOSPITAL_COMMUNITY): Payer: PPO | Admitting: Neurological Surgery

## 2018-06-08 DIAGNOSIS — F17210 Nicotine dependence, cigarettes, uncomplicated: Secondary | ICD-10-CM | POA: Diagnosis present

## 2018-06-08 DIAGNOSIS — R531 Weakness: Secondary | ICD-10-CM

## 2018-06-08 DIAGNOSIS — D320 Benign neoplasm of cerebral meninges: Secondary | ICD-10-CM

## 2018-06-08 DIAGNOSIS — R296 Repeated falls: Secondary | ICD-10-CM | POA: Diagnosis present

## 2018-06-08 DIAGNOSIS — R3915 Urgency of urination: Secondary | ICD-10-CM | POA: Diagnosis present

## 2018-06-08 DIAGNOSIS — G939 Disorder of brain, unspecified: Secondary | ICD-10-CM

## 2018-06-08 DIAGNOSIS — M50222 Other cervical disc displacement at C5-C6 level: Secondary | ICD-10-CM

## 2018-06-08 DIAGNOSIS — J9811 Atelectasis: Secondary | ICD-10-CM

## 2018-06-08 DIAGNOSIS — M50322 Other cervical disc degeneration at C5-C6 level: Secondary | ICD-10-CM

## 2018-06-08 DIAGNOSIS — Z9049 Acquired absence of other specified parts of digestive tract: Secondary | ICD-10-CM

## 2018-06-08 DIAGNOSIS — E785 Hyperlipidemia, unspecified: Secondary | ICD-10-CM | POA: Diagnosis present

## 2018-06-08 DIAGNOSIS — I10 Essential (primary) hypertension: Secondary | ICD-10-CM | POA: Diagnosis present

## 2018-06-08 DIAGNOSIS — Z79899 Other long term (current) drug therapy: Secondary | ICD-10-CM

## 2018-06-08 DIAGNOSIS — D329 Benign neoplasm of meninges, unspecified: Principal | ICD-10-CM | POA: Diagnosis present

## 2018-06-08 DIAGNOSIS — R9431 Abnormal electrocardiogram [ECG] [EKG]: Secondary | ICD-10-CM

## 2018-06-08 DIAGNOSIS — R200 Anesthesia of skin: Secondary | ICD-10-CM | POA: Diagnosis present

## 2018-06-08 DIAGNOSIS — M21379 Foot drop, unspecified foot: Secondary | ICD-10-CM | POA: Diagnosis present

## 2018-06-08 DIAGNOSIS — J382 Nodules of vocal cords: Secondary | ICD-10-CM

## 2018-06-08 DIAGNOSIS — M50223 Other cervical disc displacement at C6-C7 level: Secondary | ICD-10-CM

## 2018-06-08 DIAGNOSIS — M50321 Other cervical disc degeneration at C4-C5 level: Secondary | ICD-10-CM

## 2018-06-08 DIAGNOSIS — R159 Full incontinence of feces: Secondary | ICD-10-CM | POA: Diagnosis present

## 2018-06-08 DIAGNOSIS — Z9189 Other specified personal risk factors, not elsewhere classified: Secondary | ICD-10-CM

## 2018-06-08 DIAGNOSIS — Z1159 Encounter for screening for other viral diseases: Secondary | ICD-10-CM

## 2018-06-08 DIAGNOSIS — M129 Arthropathy, unspecified: Secondary | ICD-10-CM | POA: Diagnosis present

## 2018-06-08 DIAGNOSIS — K449 Diaphragmatic hernia without obstruction or gangrene: Secondary | ICD-10-CM

## 2018-06-08 DIAGNOSIS — M4802 Spinal stenosis, cervical region: Secondary | ICD-10-CM

## 2018-06-08 LAB — BASIC METABOLIC PANEL
ANION GAP: 10 mmol/L (ref 4–13)
ANION GAP: 10 mmol/L (ref 4–13)
BUN/CREA RATIO: 16 (ref 6–22)
BUN: 12 mg/dL (ref 8–25)
CALCIUM: 9.6 mg/dL (ref 8.5–10.2)
CHLORIDE: 105 mmol/L (ref 96–111)
CO2 TOTAL: 25 mmol/L (ref 22–32)
CREATININE: 0.74 mg/dL (ref 0.49–1.10)
ESTIMATED GFR: >60 mL/min/1.73m?2 (ref >60)
ESTIMATED GFR: >60 mL/min/1.73mˆ2 (ref >60)
GLUCOSE: 94 mg/dL (ref 65–139)
POTASSIUM: 3.9 mmol/L (ref 3.5–5.1)
SODIUM: 140 mmol/L (ref 136–145)

## 2018-06-08 LAB — TYPE AND SCREEN
ABO/RH(D): O POS
ANTIBODY SCREEN: NEGATIVE

## 2018-06-08 LAB — URINALYSIS, MACROSCOPIC
BILIRUBIN: NEGATIVE mg/dL
COLOR: NORMAL
GLUCOSE: NEGATIVE mg/dL
KETONES: NEGATIVE mg/dL
LEUKOCYTES: NEGATIVE WBCs/uL
NITRITE: NEGATIVE
PH: 7 (ref 5.0–8.0)
PROTEIN: NEGATIVE mg/dL
SPECIFIC GRAVITY: >1.03 — ABNORMAL HIGH (ref 1.005–1.030)
UROBILINOGEN: NEGATIVE mg/dL

## 2018-06-08 LAB — URINALYSIS, MICROSCOPIC
RBCS: 4 /HPF (ref <6.0)
WBCS: <1 /HPF (ref <11.0)

## 2018-06-08 LAB — CBC WITH DIFF
BASOPHIL #: <0.1 x10ˆ3/uL (ref <=0.20)
BASOPHIL %: 1 %
EOSINOPHIL #: 0.16 10*3/uL (ref <=0.50)
EOSINOPHIL %: 2 %
HCT: 45.1 % (ref 34.8–46.0)
HGB: 15.4 g/dL (ref 11.5–16.0)
IMMATURE GRANULOCYTE #: <0.1 x10ˆ3/uL (ref <0.10)
IMMATURE GRANULOCYTE %: 1 % (ref 0–1)
LYMPHOCYTE #: 3.27 x10ˆ3/uL (ref 1.00–4.80)
LYMPHOCYTE %: 33 %
MCH: 29.3 pg (ref 26.0–32.0)
MCHC: 34.1 g/dL (ref 31.0–35.5)
MCV: 85.7 fL (ref 78.0–100.0)
MONOCYTE #: 0.56 x10ˆ3/uL (ref 0.20–1.10)
MONOCYTE %: 6 %
MPV: 9.6 fL (ref 8.7–12.5)
NEUTROPHIL #: 5.85 x10ˆ3/uL (ref 1.50–7.70)
NEUTROPHIL %: 57 %
PLATELETS: 258 x10ˆ3/uL (ref 150–400)
RBC: 5.26 x10ˆ6/uL — ABNORMAL HIGH (ref 3.85–5.22)
RDW-CV: 13.4 % (ref 11.5–15.5)
WBC: 10 x10ˆ3/uL (ref 3.7–11.0)

## 2018-06-08 LAB — PT/INR
INR: 0.98 (ref 0.80–1.20)
PROTHROMBIN TIME: 11.6 s (ref 9.5–14.1)

## 2018-06-08 LAB — POC BLOOD GLUCOSE (RESULTS): GLUCOSE, POC: 128 mg/dL — ABNORMAL HIGH (ref 70–105)

## 2018-06-08 LAB — POC ISTAT CREATININE (RESULT): CREATININE, POC: 0.8 mg/dL (ref 0.49–1.10)

## 2018-06-08 LAB — PHOSPHORUS: PHOSPHORUS: 3.5 mg/dL (ref 2.4–4.7)

## 2018-06-08 LAB — HIV1/HIV2 SCREEN, COMBINED ANTIGEN AND ANTIBODY: HIV SCREEN, COMBINED ANTIGEN & ANTIBODY: NEGATIVE

## 2018-06-08 LAB — MAGNESIUM: MAGNESIUM: 2.2 mg/dL (ref 1.6–2.6)

## 2018-06-08 LAB — PTT (PARTIAL THROMBOPLASTIN TIME): APTT: 34.1 s (ref 24.1–38.5)

## 2018-06-08 LAB — HEPATITIS C ANTIBODY SCREEN WITH REFLEX TO HCV PCR: HCV ANTIBODY QUALITATIVE: NEGATIVE

## 2018-06-08 MED ORDER — HYDRALAZINE 20 MG/ML INJECTION SOLUTION
10.0000 mg | INTRAMUSCULAR | Status: DC | PRN
Start: 2018-06-08 — End: 2018-06-09
  Filled 2018-06-08: qty 1

## 2018-06-08 MED ORDER — DEXAMETHASONE 4 MG TABLET
4.0000 mg | ORAL_TABLET | Freq: Four times a day (QID) | ORAL | Status: DC
Start: 2018-06-08 — End: 2018-06-09
  Administered 2018-06-08 – 2018-06-09 (×2): 4 mg via ORAL
  Filled 2018-06-08 (×2): qty 1

## 2018-06-08 MED ORDER — SODIUM CHLORIDE 0.9 % (FLUSH) INJECTION SYRINGE
2.0000 mL | INJECTION | INTRAMUSCULAR | Status: DC | PRN
Start: 2018-06-08 — End: 2018-06-09

## 2018-06-08 MED ORDER — SENNOSIDES 8.6 MG-DOCUSATE SODIUM 50 MG TABLET
1.0000 | ORAL_TABLET | Freq: Every day | ORAL | Status: DC
Start: 2018-06-08 — End: 2018-06-09
  Administered 2018-06-08: 0 via ORAL
  Filled 2018-06-08: qty 1

## 2018-06-08 MED ORDER — DOCUSATE SODIUM 100 MG CAPSULE
100.0000 mg | ORAL_CAPSULE | Freq: Two times a day (BID) | ORAL | Status: DC
Start: 2018-06-08 — End: 2018-06-09
  Administered 2018-06-08 – 2018-06-09 (×2): 0 mg via ORAL
  Filled 2018-06-08: qty 1

## 2018-06-08 MED ORDER — IOVERSOL 350 MG IODINE/ML INTRAVENOUS SOLUTION
50.00 mL | INTRAVENOUS | Status: AC
Start: 2018-06-08 — End: 2018-06-08
  Administered 2018-06-08 (×2): 50 mL via INTRAVENOUS

## 2018-06-08 MED ORDER — INSULIN REGULAR HUMAN 100 UNIT/ML INJECTION SSIP
0.00 [IU] | INJECTION | Freq: Four times a day (QID) | SUBCUTANEOUS | Status: DC | PRN
Start: 2018-06-08 — End: 2018-06-09
  Filled 2018-06-08: qty 3

## 2018-06-08 MED ORDER — FAMOTIDINE 20 MG TABLET
20.0000 mg | ORAL_TABLET | Freq: Two times a day (BID) | ORAL | Status: DC
Start: 2018-06-08 — End: 2018-06-09
  Administered 2018-06-08 – 2018-06-09 (×2): 20 mg via ORAL
  Filled 2018-06-08 (×2): qty 1

## 2018-06-08 MED ORDER — GADOBUTROL 10 MMOL/10 ML (1 MMOL/ML) INTRAVENOUS SOLUTION
7.50 mL | INTRAVENOUS | Status: AC
Start: 2018-06-08 — End: 2018-06-08
  Administered 2018-06-08: 15:00:00 7.5 mL via INTRAVENOUS

## 2018-06-08 MED ORDER — SODIUM CHLORIDE 0.9 % (FLUSH) INJECTION SYRINGE
2.0000 mL | INJECTION | Freq: Three times a day (TID) | INTRAMUSCULAR | Status: DC
Start: 2018-06-08 — End: 2018-06-09
  Administered 2018-06-08 – 2018-06-09 (×2): 2 mL
  Administered 2018-06-09: 0 mL

## 2018-06-08 MED ORDER — ENOXAPARIN 40 MG/0.4 ML SUBCUTANEOUS SYRINGE
40.0000 mg | INJECTION | SUBCUTANEOUS | Status: DC
Start: 2018-06-09 — End: 2018-06-09
  Administered 2018-06-09 (×2): 0 mg via SUBCUTANEOUS
  Filled 2018-06-08: qty 0.4

## 2018-06-08 MED ORDER — NICARDIPINE 25 MG/10 ML INTRAVENOUS SOLUTION
5.0000 mg/h | INTRAVENOUS | Status: DC
Start: 2018-06-08 — End: 2018-06-09
  Administered 2018-06-08: 2.5 mg/h via INTRAVENOUS
  Administered 2018-06-08: 5 mg/h via INTRAVENOUS
  Administered 2018-06-09: 0 mg/h via INTRAVENOUS
  Filled 2018-06-08 (×2): qty 20

## 2018-06-08 MED ORDER — IOVERSOL 350 MG IODINE/ML INTRAVENOUS SOLUTION
100.00 mL | INTRAVENOUS | Status: AC
Start: 2018-06-08 — End: 2018-06-08
  Administered 2018-06-08: 100 mL via INTRAVENOUS

## 2018-06-08 MED ORDER — ONDANSETRON HCL (PF) 4 MG/2 ML INJECTION SOLUTION
4.0000 mg | Freq: Four times a day (QID) | INTRAMUSCULAR | Status: DC | PRN
Start: 2018-06-08 — End: 2018-06-09

## 2018-06-08 MED ORDER — FLU VACCINE QS 2019-20(6MOS UP)(PF) 60 MCG(15 MCGX4)/0.5 ML IM SYRINGE
0.50 mL | INJECTION | Freq: Once | INTRAMUSCULAR | Status: AC
Start: 2018-06-09 — End: 2018-06-09
  Administered 2018-06-09: 0.5 mL via INTRAMUSCULAR
  Filled 2018-06-08: qty 0.5

## 2018-06-08 MED ORDER — ACETAMINOPHEN 325 MG TABLET
650.0000 mg | ORAL_TABLET | Freq: Four times a day (QID) | ORAL | Status: DC | PRN
Start: 2018-06-08 — End: 2018-06-09
  Administered 2018-06-08 – 2018-06-09 (×3): 650 mg via ORAL
  Filled 2018-06-08 (×2): qty 2

## 2018-06-08 MED ADMIN — erythromycin 5 mg/gram (0.5 %) eye ointment: ORAL | @ 22:00:00 | NDC 17478007031

## 2018-06-08 NOTE — ED Nurses Note (Signed)
Verbal report given at this time

## 2018-06-08 NOTE — ED Nurses Note (Signed)
Neurosurgery paged concerning patients manual BP of 270/165. Neurosurgery called and stated that a nicardipine gtt would be ordered.

## 2018-06-08 NOTE — ED Attending Note (Signed)
ADDENDUM:    I performed a history and physical examination of the patient and discussed his/her management with the resident.  I reviewed the resident's note and agree with the documented findings and plan of care with the following additions / exceptions    [name] Abide, MD 06/08/2018, 21:13    HPI:    57 y.o. female presents with extremity weakness.  Patient states that primarily she was having worsening right-sided weakness.  She was getting an outpatient MRI was instructed to come here secondary to the results.      PE:  BP (!) 157/97   Pulse 73   Temp 37 C (98.6 F)   Resp 20   Ht 1.676 m (5\' 6" )   Wt 77.1 kg (170 lb)   SpO2 92%   BMI 27.44 kg/m       Patient afebrile, vital signs stable, no acute distress  Heent-Normal, atraumatic, neck nontender  CVS-rrr, no murmur  Lungs-clear, no wheezing or rhonchi  Abdomen-soft, non tender, no rebound or guarding  Neuro-no gross deficits, normal sensation  MS-patient has subjective weakness to the right upper extremity as compared to the left  Skin-no rash or lacerations    Labs:        Images:      Reviewed by me    EKG:        Impression:    Meningioma    Patient with a meningioma with some symptomatic findings.  The neurosurgery service was consulted and they agreed to admit the patient.

## 2018-06-08 NOTE — Nurses Notes (Signed)
Patient arrived to 101. She was oriented to the room and call bell.

## 2018-06-08 NOTE — ED Nurses Note (Addendum)
Patient reports to the ED due to right sided weakness for the past four years. Weakness got worse over the past year. Since June, patient reports it "has gotten progressively worse". Patient reports abnormal gait due to her "dragging" her right foot, increase in falls due to abnormal gait, upper and lower right sided weakness, paraesthesias in right hand and right heel, and urinary/bowel incontinence. Patient reports that she feels the urge, she just can't make it to the restroom in time. Patient reports dizziness and headaches almost daily. Patient's primary doctor sent her to a neurologist and neuro ordered MRIs. Patient got MRIs done today and was told to come here by radiologist.

## 2018-06-08 NOTE — ED Nurses Note (Signed)
Neurosurg at bedside for consult

## 2018-06-08 NOTE — ED Provider Notes (Signed)
[facility]  Emergency Department  Provider Note    Name: [name]  Age and Gender: 57 y.o. female  Date of Birth: 10-29-1960  Date of Service: 06/08/2018  MRN: [id number]  PCP: Isac Caddy, MD  Attending: Dr. Tia Masker     -------------------------------------------------------------------------------------------------------------------------------------------    Arrival: The patient arrived by private car and is accompanied by patient and daughter.  History Obtained by: provider  History Limitations: none    Chief Complaint   Patient presents with   . Extremity Weakness     pt presents to er for Abnormal MRI done at 3pm, pt states she is loosing function with the right side for the last year.        HPI:  [name] is a 57 y.o. White female presenting with worsening right-sided weakness.  Patient was recently in the MRI suite and was told by radiology to come straight to the emergency department.  Patient states for the last roughly 4 year she has been having progressive right-sided weakness.  Approximately since June of 2019 to now it has significantly worsened to the point of multiple falls.  Patient changed shoes and no longer wears heels.  Patient does have a right-sided foot drop.  Patient states she has significant weakness on the right side with constant numbness.  Patient states she also has blurry vision that is progressive throughout the day.  Patient admits to worsening dizziness and headaches particularly in the last 2 weeks that has gotten significantly worse.  Patient denies any hearing changes, dysphagia, choking.  No sensation changes on the face.  Patient admits to memory changes occurring more recently where she will stop talking mid-sentence then forget what she was trying to say.  Patient also admits to increase urinary urgency and bowel incontinence that has gotten progressively worse.    ROS:  Constitutional: No fever, chills.  Positive weakness  Skin: No rash  or diaphoresis   HENT:  Positive headaches.  No congestion   Eyes:  Positive vision changes.  No photophobia   Cardio: No chest pain, palpitations or leg swelling   Respiratory: No cough, wheezing or SOB   GI:  No nausea, vomiting or stool changes   GU:  No dysuria, hematuria.  Positive urinary urgency  MSK: No muscle aches, joint or back pain   Neuro: No seizures, LOC.  Positive numbness, tingling on right upper and lower extremity.  Right sided weakness upper and lower extremities  Psychiatric: No depression, SI or substance abuse   All other systems reviewed and are negative.     Below pertinent information reviewed with patient:  Past Medical History:   Diagnosis Date   . Arthropathy    . HTN (hypertension)    . Hyperlipemia        Medications Prior to Admission     Prescriptions    diphenoxylate-atropine (LOMOTIL) 2.5-0.025 mg Oral Tablet    Once per day as needed    metoprolol tartrate (LOPRESSOR) 25 mg Oral Tablet    Take 25 mg by mouth Twice daily    ondansetron (ZOFRAN) 8 mg Oral Tablet    Take 8 mg by mouth Twice per day as needed    quinapril (ACCUPRIL) 40 mg Oral Tablet    Take 40 mg by mouth Once a day          No Known Allergies    Past Surgical History:   Procedure Laterality Date   . HX APPENDECTOMY     .  HX LAP CHOLECYSTECTOMY     . HX LUMBAR DISKECTOMY  1999       Family Medical History:     Problem Relation (Age of Onset)    Ehlers-Danlos syndrome Daughter    Heart Attack Mother    Lymphoma Mother          Social History     Socioeconomic History   . Marital status: Divorced     Spouse name: Not on file   . Number of children: Not on file   . Years of education: Not on file   . Highest education level: Not on file   Occupational History   . Not on file   Social Needs   . Financial resource strain: Not on file   . Food insecurity:     Worry: Not on file     Inability: Not on file   . Transportation needs:     Medical: Not on file     Non-medical: Not on file   Tobacco Use   . Smoking status:  Current Every Day Smoker   . Smokeless tobacco: Never Used   Substance and Sexual Activity   . Alcohol use: Never     Frequency: Never   . Drug use: Never   . Sexual activity: Not on file   Lifestyle   . Physical activity:     Days per week: Not on file     Minutes per session: Not on file   . Stress: Not on file   Relationships   . Social connections:     Talks on phone: Not on file     Gets together: Not on file     Attends religious service: Not on file     Active member of club or organization: Not on file     Attends meetings of clubs or organizations: Not on file     Relationship status: Not on file   . Intimate partner violence:     Fear of current or ex partner: Not on file     Emotionally abused: Not on file     Physically abused: Not on file     Forced sexual activity: Not on file   Other Topics Concern   . Not on file   Social History Narrative   . Not on file     Objective:  ED Triage Vitals [06/08/18 1528]   Enc Vitals Group      BP (Non-Invasive) 110/74      Heart Rate 63      Respiratory Rate 20      Temperature 37 C (98.6 F)      Temp src       SpO2 94 %      Weight 77.1 kg (170 lb)      Height 1.676 m ('5\' 6"' )      Head Circumference       Peak Flow       Pain Score       Pain Loc       Pain Edu?       Excl. in [place]?      Nursing notes and vital signs reviewed.    Constitutional: Pleasant 57 y.o. female appears stated age in fair health, in no acute distress, normal color, no cyanosis.   HENT:   Head: Normocephalic and atraumatic.   Mouth/Throat: Oropharynx is clear and moist.  Eyes: EOMI, PERRL   Neck: Trachea midline. Neck supple.   Cardiovascular: RRR,  No murmurs, rubs or gallops. Intact distal pulses.   Pulmonary/Chest: BS equal bilaterally. No respiratory distress. No wheezes, rales or chest tenderness.   Abdominal: BS +. Abdomen soft, no tenderness, rebound or guarding.   Back: No midline spinal tenderness, no paraspinal tenderness, no CVA tenderness.   Musculoskeletal: No edema, tenderness or  deformity.   Skin: warm and dry. No rash, erythema, pallor or cyanosis.   Psychiatric: normal mood and affect. Behavior is normal.   Neurological: Patient keenly alert and responsive slightly slowed speech, CN II-XII intact.  Right-sided upper and lower extremity weakness.  Difficulty with finger-to-nose on the right side.    Labs:   Labs Reviewed   CBC WITH DIFF - Abnormal; Notable for the following components:       Result Value    RBC 5.26 (*)     All other components within normal limits   URINALYSIS, MACROSCOPIC - Abnormal; Notable for the following components:    SPECIFIC GRAVITY >1.030 (*)     BLOOD Small (*)     All other components within normal limits    Narrative:     Test did not meet guideline to perform Urine Culture.   POC BLOOD GLUCOSE (RESULTS) - Abnormal; Notable for the following components:    GLUCOSE, POC 128 (*)     All other components within normal limits   HEPATITIS C ANTIBODY SCREEN WITH REFLEX TO HCV PCR - Normal   HIV1/HIV2 SCREEN, COMBINED ANTIGEN AND ANTIBODY - Normal   BASIC METABOLIC PANEL - Normal    Narrative:     Hemolysis can alter results at this level (slight).  Estimated Glomerular Filtration Rate (eGFR) calculated using the CKD-EPI (2009) equation, intended for patients 24 years of age and older. If race and/or gender is not documented or "unknown," there will be no eGFR calculation.   MAGNESIUM - Normal    Narrative:     Hemolysis can alter results at this level (slight).   PHOSPHORUS - Normal    Narrative:     Hemolysis can alter results at this level (slight).   PT/INR - Normal    Narrative:     Coumadin therapy INR range for Conventional Anticoagulation is 2.0 to 3.0 and for Intensive Anticoagulation 2.5 to 3.5.   PTT (PARTIAL THROMBOPLASTIN TIME) - Normal    Narrative:     Therapeutic range for unfractionated heparin is 60-100 seconds.   URINALYSIS, MICROSCOPIC - Normal    Narrative:     Test did not meet guideline to perform Urine Culture.   CBC/DIFF    Narrative:      The following orders were created for panel order CBC/DIFF.  Procedure                               Abnormality         Status                     ---------                               -----------         ------                     CBC WITH DIFF[[id number]]                Abnormal  Final result                 Please view results for these tests on the individual orders.   URINALYSIS, MACROSCOPIC AND MICROSCOPIC W/CULTURE REFLEX    Narrative:     The following orders were created for panel order URINALYSIS, MACROSCOPIC AND MICROSCOPIC W/CULTURE REFLEX.  Procedure                               Abnormality         Status                     ---------                               -----------         ------                     URINALYSIS, MACROSCOPIC[[id number]]      Abnormal            Final result               URINALYSIS, MICROSCOPIC[[id number]]      Normal              Final result                 Please view results for these tests on the individual orders.   PERFORM POC WHOLE BLOOD GLUCOSE   TYPE AND SCREEN   TYPE AND SCREEN     Imaging:  CT ANGIO INTRACRANIAL WITH CT VENOGRAM   Preliminary Result   1.  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   2.  No acute arterial vasculature intracranial abnormality.   3.  Filling defect within the left transverse sinus is again demonstrated.   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         CT CHEST ABDOMEN PELVIS W IV CONTRAST   Final Result   1.  Small perifissural nodule in the anterior portion of the right lower   lobe.   2.  Small hiatal hernia.   3.  No evidence of metastases.         XR AP MOBILE CHEST   Final Result   Mild left basilar atelectasis.           Procedures    Clinical Impression:     Encounter Diagnosis   Name Primary?   . Meningioma (CMS [facility]) Yes     MDM/Course:  ED Course as of Jun 08 2146   Sat Jun 08, 2018   1750  Patient is a 57 year old female with no significant past medical history who presents with worsening progressive right-sided upper and lower extremity weakness and numbness.  Patient was recently seen by Dr. Knox Royalty on 10/29 who then ordered MRI brain and cervical spine which patient had completed today.  Radiology called directly following knee MRI stated to come to the emergency department.  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    [EE]   1756 Basic lab work ordered including CBC, BMP, Mag, phos    [EE]   [facility] was consulted and has agreed to assess the  patient.    [EE]   [id number] Neurosurgery has agreed to accept the patient    [EE]      ED Course User Index  [EE] Ethelene Hal, DO     Medications given:  Medications     Disposition: Admitted    Patient seen by and discussed with attending physician, Dr. Tia Masker.    Parts of this patients chart were completed in a retrospective fashion due to simultaneous direct patient care activities in the Emergency Department.   This note was partially generated using MModal Fluency Direct system, and there may be some incorrect words, spellings, and punctuation that were not noted in checking the note before saving.      Ethelene Hal, DO 06/08/2018 21:47  PGY-1 Internal Medicine  [facility]

## 2018-06-08 NOTE — H&P (Signed)
[facility]  Neurosurgery   Admission History and Physical    [name], [name], 57 y.o. female  Date of Admission:  06/08/2018  Date of Birth:  10/05/1960  Referring Physician:  No ref. provider found     Information Obtained from: patient, daughter, health care provider and history reviewed via medical record  Chief Complaint: progressive RUE and RLE weakness    HPI:      [name] is a 57 year-old female who reports progressive right upper and lower extremity weakness over the past 3-4 years. States that the weakness is progressive in nature and has begun to impact her ADLs, resulting in falls due to her right leg giving out. States her weakness is continuous and constant. No major weakness noted on the left side. Denies any numbness on the right side with the exception of the sole of her right foot. No changes in speech or vision.     She was evaluated by Dr. Knox Royalty, neurology, in the outpatient setting on 05/21/18 who ordered an MRI of the brain and cervical spine due to the presence of the right-sided weakness in the context of upper motor neuron signs on the right side (Hoffman, Babinski, hyper-reflexia). 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 Due to the findings, she was called and advised to come to the ED to seek evaluation.    PMH significant for HTN. Denies use of any anti-coagulant or anti-platelet agents. Denies any known personal history of cancer.     Current smoker at 1/2 PPD for the past 30 years (15 pack-year history). Last colonoscopy greater than 20 years prior and reportedly negative for malignancy; during that time, she underwent multiple colonoscopies for work-up of Crohn's disease and even had an exploratory laparotomy done, which was reportedly negative. Last mammogram 4-5 years prior and reportedly negative for malignancy. Denies any known abnormal skin lesions.    Admission Source:   Home  Advance Directives:  None-Discussed  Hospice involvement prior to admission?  Not applicable    ROS: (MUST comment on all "Abnormal" findings)  Other than ROS in the HPI, all other systems were negative.    PAST MEDICAL/ FAMILY/ SOCIAL HISTORY:   Past Medical History:   Diagnosis Date   . Arthropathy    . HTN (hypertension)    . Hyperlipemia      Medications Prior to Admission     Prescriptions    diphenoxylate-atropine (LOMOTIL) 2.5-0.025 mg Oral Tablet    Once per day as needed    metoprolol tartrate (LOPRESSOR) 25 mg Oral Tablet    Take 25 mg by mouth Twice daily    ondansetron (ZOFRAN) 8 mg Oral Tablet    Take 8 mg by mouth Twice per day as needed    quinapril (ACCUPRIL) 40 mg Oral Tablet    Take 40 mg by mouth Once a day         No Known Allergies  Past Surgical History:   Procedure Laterality Date   . HX APPENDECTOMY     . HX LAP CHOLECYSTECTOMY     . HX LUMBAR DISKECTOMY  1999     Social History     Tobacco Use   . Smoking status: Current Every Day Smoker   . Smokeless tobacco: Never Used   Substance Use Topics   . Alcohol use: Never     Frequency: Never   . Drug use: Never  Past Family History:   Family Medical History:     Problem Relation (Age of Onset)    Ehlers-Danlos syndrome Daughter    Heart Attack Mother    Lymphoma Mother        PHYSICAL EXAMINATION: (MUST comment on all "Abnormal" findings)    Glasgow Coma Scale: Eye opening:  4 spontaneous, Verbal response:  5 oriented, Best motor response:  6 obeys commands    Constitutional  Temperature: 37 C (98.6 F)  Heart Rate: 63  BP (Non-Invasive): 110/74  Respiratory Rate: 20  SpO2: 94 %  Pain Score (Numeric, Faces): 0    Appears stated age, NAD  A&Ox3  GCS 4 6 5     Fluent speech  Appropriate fund of knowledge  Appropriate attention span & concentration  Appropriate recent and remote memory    CN 2 PERRL  CN 3 4 6  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)  CN 7 Face symmetric  CN 8 Hearing grossly intact  CN 11 shrug symmetric  CN 12  Tongue midline    Muscle Strength  -- LUE 4+/5 diffusely in all major muscle groups, no pronator drift  -- RUE 4/5 diffusely in all major muscle groups, +pronator drift  -- LLE 5/5  -- RLE 4/5 diffusely in all major muscle groups  Muscle tone WNL  SILT BUE and BLE  Bilateral Hoffman (R>L)  R Babinski  R biceps hyper-reflexia  R patella hyper-reflexia  No clonus    Labs Ordered/ Reviewed : (Please indicate ordered or reviewed)  Reviewed    Diagnostic Tests Ordered/ Reviewed: (Please indicate ordered or reviewed)  Reviewed    Radiology Tests Ordered/ Reviewed: (Please indicate ordered or reviewed)  Reviewed    Current Comorbid Conditions - Neurosurgery H&P  Compression of the Brain:  yes and Cerebral Edema:  yes  Coma -Not applicable  Loss of Consciousness:  no  Cerebral Hemorrhage: Not applicable  Craniectomy:  no  Seizure-Not applicable   Respiratory Failure/Other-Not applicable  Coagulopathy Not applicable    ASSESSMENT/Plan:   [name] is a 57 yo F PMH HTN who presents w/ RUE and RLE weakness, found to have a L tentorial meningoma. HD1.  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 Laural Golden, M.D.  PGY-4 Neurosurgery  06/08/2018, 18:32       Late entry for {06/08/2018. I reviewed the case and images with Dr Laural Golden.  I reviewed the resident's note.  I agree with the findings and plan of care as  documented in the resident's note.  Any exceptions/additions are edited/noted.  Patient will be followed at [facility] with Dr Hulan Saas.    Vincente Poli, MD

## 2018-06-09 LAB — ECG 12-LEAD
Atrial Rate: 58 {beats}/min
Calculated P Axis: 34 degrees
Calculated P Axis: 34 degrees
Calculated R Axis: -20 degrees
Calculated T Axis: 120 degrees
PR Interval: 146 ms
QRS Duration: 90 ms
QT Interval: 426 ms
QTC Calculation: 418 ms
Ventricular rate: 58 {beats}/min

## 2018-06-09 LAB — POC BLOOD GLUCOSE (RESULTS): GLUCOSE, POC: 190 mg/dl — ABNORMAL HIGH (ref 70–105)

## 2018-06-09 MED ORDER — METOPROLOL TARTRATE 50 MG TABLET
50.0000 mg | ORAL_TABLET | Freq: Every day | ORAL | Status: DC
Start: 2018-06-09 — End: 2018-06-09

## 2018-06-09 MED ORDER — METOPROLOL TARTRATE 25 MG TABLET
25.00 mg | ORAL_TABLET | Freq: Two times a day (BID) | ORAL | Status: DC
Start: 2018-06-09 — End: 2018-06-09
  Administered 2018-06-09: 25 mg via ORAL
  Filled 2018-06-09: qty 1

## 2018-06-09 MED ORDER — LISINOPRIL 10 MG TABLET
40.0000 mg | ORAL_TABLET | Freq: Every day | ORAL | Status: DC
Start: 2018-06-09 — End: 2018-06-09
  Administered 2018-06-09: 40 mg via ORAL
  Filled 2018-06-09: qty 4

## 2018-06-09 MED ORDER — ACETAMINOPHEN 325 MG TABLET: 650 mg | Freq: Four times a day (QID) | ORAL | 0 refills | 0 days | Status: AC | PRN

## 2018-06-09 MED ORDER — IBUPROFEN 400 MG TABLET
400.0000 mg | ORAL_TABLET | Freq: Four times a day (QID) | ORAL | Status: DC | PRN
Start: 2018-06-09 — End: 2018-06-09
  Administered 2018-06-09: 400 mg via ORAL
  Filled 2018-06-09: qty 1

## 2018-06-09 MED ADMIN — sodium chloride 0.9 % intravenous solution: ORAL | @ 02:00:00

## 2018-06-09 NOTE — Care Plan (Signed)
[place]  Occupational Therapy Initial Evaluation    Patient Name: [name]  Date of Birth: 05-Apr-1961  Height: Height: 167.6 cm (5' 6" )  Weight: Weight: 77.1 kg (170 lb)  Room/Bed: 1059/A  Payor: HEALTH PLAN / Plan: HEALTH PLAN (PPO-NON ST EMP) / Product Type: PPO /     Assessment:   Pt tolerated OT evaluation well this date. Pt demonstrates decreased strength and coordination with RUE (dominant) making completing ADLs challenging. Pt was able to safely complete LB dsg, toileting and grooming at the sink with SPV assist and was provided with BUE strengthening exercises. Pt demonstrated improved balance with ankle brace and reported feeling "safer" with functional transfers/ambulation. Pt will have assistance at d/c from her daughter. Recommending d/c home with 24/7 assist when medically appropriate.       Discharge Needs:   Equipment Recommendation: tub bench      Discharge Disposition: home with 24/7 assistance    JUSTIFICATION OF DISCHARGE RECOMMENDATION   Based on current diagnosis, functional performance prior to admission, and current functional performance, this patient requires continued OT services in home with 24/7 assistance  in order to achieve significant functional improvements.    Plan:   Current Intervention: ADL retraining, balance training, bed mobility training, endurance training, fine motor coordination training, strengthening, transfer training    To provide Occupational therapy services 1x/day, minimum of 2x/week, until discharge.       The risks/benefits of therapy have been discussed with the patient/caregiver and he/she is in agreement with the established plan of care.       Subjective & Objective        06/09/18 1509   Therapist Pager   OT Assigned/ Pager # [phone] [phone]   Rehab Session   Document Type evaluation   Total OT Minutes: 20   Patient Effort good   Symptoms Noted During/After Treatment fatigue   General Information   Patient  Profile Reviewed? yes   Onset of Illness/Injury or Date of Surgery 06/08/18   Pertinent History of Current Functional Problem [name] is a 57 year-old female who reports progressive right upper and lower extremity weakness over the past 3-4 years and increasingly interfering with ADLs as well as RLE collapsing precipitating falls. 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   Medical Lines PIV Line   Respiratory Status room air   Existing Precautions/Restrictions full code;fall precautions   Pre Treatment Status   Pre Treatment Patient Status Patient supine in bed;Call light within reach;Telephone within reach;Sitter select activated;Nurse approved session   Support Present Pre Treatment  Family present   Communication Pre Treatment  Nurse   Communication Pre Treatment Comment nurse approved session    Living Environment   Lives With parent(s);child(ren), adult   Living Arrangements house   Home Accessibility stairs (1 railing present);stairs to enter home;stairs within home;tub/shower is not walk in   Number of Stairs to [place] 1   Number of Stairs Within Home 15   [place] Pt reports living with her mother and 1 of her adult daughters in a 2 level home with a tub shower    Functional Level Prior   Ambulation 0 - independent   Transferring 0 - independent   Toileting 0 - independent   Bathing 0 - independent   Dressing 0 - independent   Eating 0 - independent   Prior Functional Level Comment reports independence with mobility and ADLs,  however has been having significant difficulty with dsg her R side and with overhead grooming/bathing tasks d/t increase in weakness that has progressively gotten worse. reports she does have a cane available    Self-Care   Dominant Hand right   Pain Assessment   Pain Scale: Numbers, Pretreatment 0/10 - no pain   Pain Scale: Numbers, Post-Treatment 0/10 - no pain   Coping/Psychosocial   Observed Emotional State calm;cooperative   Verbalized  Emotional State acceptance   Plan of Care Reviewed With patient;daughter   Family/Support System   Family/Support Persons daughter   Involvement in Care at bedside;attentive to patient;supportive of patient   Coping/Psychosocial Response Interventions   Plan Of Care Reviewed With patient;daughter   Coping/Psychosocial Interventions   Supportive Measures active listening utilized   Cognitive Assessment/Interventions   Behavior/Mood Observations behavior appropriate to situation, WNL/WFL   Attention WNL/WFL   Follows Commands WNL   RUE Assessment   RUE Assessment X- Exceptions   RUE ROM WFL   RUE Strength shoulder flexion, extension, elbow flexion/ extension 3+/5.    RUE Coordination slighly impaired secondary to weakness    LUE Assessment   LUE Assessment WFL- Within Functional Limits   Grip Strength   Grip Left (4/5) good, left   Right Grip (4-/5) good minus, right   Mobility Assessment/Training   Mobility Comment Pt in supine and transitioned to EOB with SBA. Pt stood with SPV and ambulated to the restroom. Pt completed toileting transfer and hygiene with SPV assist and ambulated back to room. Pt returned to supine with SBA.    Bed Mobility Assessment/Treatment   Bed Mobility, Assistive Device Head of Bed Elevated   Supine-Sit Independence stand-by assistance   Sit to Supine, Independence stand-by assistance   Impairments strength decreased   Transfer Assessment/Treatment   Sit-Stand Independence supervision required   Stand-Sit Independence supervision required   Sit-Stand-Sit, Assist Device side by side   Toilet Transfer Independence supervision required   Transfer Safety Issues balance decreased during turns   Transfer Impairments balance impaired;coordination impaired;endurance;strength decreased   Transfer Comment pt able to transfer on/off toilet w/o use of grab bars. no LOB noted and pt reports feelings of improved balance with ankle brace    ADL Assessment/Intervention   ADL Comments doffed/donned socks  sitting EOB with SBA. Pt completed toileting task with SPV for safety. Pt educated on tub transfer bench to minimize falls and improve safety with transfer.    Lower Body Dressing Assessment/Training   Position sitting   DRESSING ASSESSED [place] Socks;[place]   Independence Level  standby assist   Impairments balance impaired;strength decreased   Comment SBA for safety    Toileting Assessment/Training   Position sitting   TOILETING ASSESSED Adjust clothing prior;Adjust clothing after;Perineal hygiene   Independence Level  supervision required   Impairments activity tolerance impaired;balance impaired;strength decreased   Grooming Assessment/Training   Position standing   Independence Level standby assist   Impairments activity tolerance impaired;strength decreased;balance impaired   Balance Skill Training   Comment side by side    Sitting Balance: Static good balance   Sitting, Dynamic (Balance) fair + balance   Sit-to-Stand Balance fair + balance   Standing Balance: Static fair + balance   Standing Balance: Dynamic fair balance   Therapeutic Exercise/Activity   Comment pt provided with BUE strengthening exercises and educated on task modification to improve safety in home environment    Post Treatment Status   Post Treatment Patient Status Patient supine in bed;Call  light within reach;Telephone within reach;Sitter select activated   Support Present Post Treatment  Family present   Clinical research associate Post Treatment Comment pt's progress and d/c recs   Care Plan Goals   OT Rehab Goals UB Dressing Goal;Transfer Training Goal 2;Toileting Goal;LB Dressing Goal;Grooming Goal;Strength Goal   Grooming Goal   Grooming Goal, Date Established 06/09/18   Grooming Goal, Time to Achieve by discharge   Grooming Goal, Activity Type all grooming tasks   Grooming Goal, Independence  modified independence   Grooming Goal, Position sitting in chair   LB Dressing Goal   LB Dressing Goal, Date  Established 06/09/18   LB Dressing Goal, Time to Achieve by discharge   LB Dressing Goal, Activity Type all lower body dressing tasks   LB Dressing Goal, Independence Level modified independence   Strength Goal   Strength Goal, Date Established 06/09/18   Strength Goal, Time to Achieve by discharge   Strength Goal, Measure to Achieve improve RUE strength by 1 MMT grade in all planes    Toileting Goal   Toileting Goal, Date Established 06/09/18   Toileting Goal, Time to Achieve by discharge   Toileting Goal, Activity Type all toileting tasks   Toileting Goal, Independence Level independent   Transfer Training Goal 2   Transfer Training Goal, Date Established 06/09/18   Transfer Training Goal, Time to Achieve by discharge   Transfer Training Goal, Activity Type all transfers   Transfer Training Goal, Independence Level modified independence   UB Dressing Goal   UB Dressing  Goal, Date Established 06/09/18   UB Dressing Goal, Time to Achieve by discharge   UB Dressing Goal, Activity Type all upper body dressing tasks   UB Dressing Goal, Independence Level modified independence   Planned Therapy Interventions, OT Eval   Planned Therapy Interventions ADL retraining;balance training;bed mobility training;endurance training;fine motor coordination training;strengthening;transfer training   Occupational Therapy Clinical Impression   Functional Level at Time of Session Pt tolerated OT evaluation well this date. Pt demonstrates decreased strength and coordination with RUE (dominant) making completing ADLs challenging. Pt was able to safely complete LB dsg, toileting and grooming at the sink with SPV assist and was provided with BUE strengthening exercises. Pt demonstrated improved balance with ankle brace and reported feeling "safer" with functional transfers/ambulation. Pt will have assistance at d/c from her daughter. Recommending d/c home with 24/7 assist when medically appropriate.    Criteria for Skilled Therapeutic  Interventions Met (OT) skilled treatment is necessary   Rehab Potential good, to achieve stated therapy goals   Therapy Frequency 1x/day;minimum of 2x/week   Predicted Duration of Therapy until discharge   Anticipated Equipment Needs at Discharge tub bench   Anticipated Discharge Disposition home with 24/7 assistance   Highest level of Mobility score   Exercise/Activity Level Performed 6- Walked 10 steps or more   Evaluation Complexity Justification   Occupational Profile Review Expanded review   Performance Deficits Mobility;Balance;Endurance;Coordination;Strength   Clinical Decision Making Moderate analytic complexity   Evaluation Complexity Moderate       Therapist:   Otho Najjar, MOT, OTR/L   Pager #: [phone]

## 2018-06-09 NOTE — Nurses Notes (Signed)
Pt discharged to home with 24/7 family assist. AVS reviewed with family and no further questions at this time. PIVs removed. Daughter to provide transportation home.
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
## 2018-06-09 NOTE — Nurses Notes (Signed)
FS 190. Pt refusing insulin coverage. Notified service.

## 2018-06-09 NOTE — Care Plan (Signed)
Patients BP is within parameters. Nicardipine was turned off at 03:00. Her right side is slightly weaker than the left. She complained of a headache at the beginning of the shift that resolved. She is convinced she's going home today. Patient remains on sitter select.   Patient Goals Based on Modifiable Risk Factors    The following modifiable risk factors were discussed with patient:     Hypertension    Goal:   Maintain blood pressure less than 140/90 (or less than 130/90 for patients with diabetes or chronic kidney disease).    Patient's selected lifestyle modification(s):  Routinely take antihypertensive medications as prescribed    Patient/Family/Caregiver response to plan:    Patient stated "ive been trying to get my BP under control for a few months".    [facility] Highest Level of Mobility Goal      Date: 06/09/18     JH-HLM Goal: 6    Exercise Level: 6- Walked 10 steps or more    Goal Outcome: Goal Achieved    Problem: Adult Inpatient Plan of Care  Goal: Plan of Care Review  Outcome: Ongoing (see interventions/notes)  Goal: Patient-Specific Goal (Individualization)  06/09/2018 0336 by Coralyn Pear, RN  Outcome: Ongoing (see interventions/notes)  06/09/2018 0331 by Coralyn Pear, RN  Flowsheets (Taken 06/08/2018 2100)  Individualized Care Needs: weaker on the right side   Anxieties, Fears or Concerns: worried about needing surgery  Patient-Specific Goals (Include Timeframe): neuro consult  Goal: Absence of Hospital-Acquired Illness or Injury  Outcome: Ongoing (see interventions/notes)  Goal: Optimal Comfort and Wellbeing  Outcome: Ongoing (see interventions/notes)  Goal: Rounds/Family Conference  Outcome: Ongoing (see interventions/notes)     Problem: Fall Injury Risk  Goal: Absence of Fall and Fall-Related Injury  Outcome: Ongoing (see interventions/notes)     Problem: Coping Ineffective (Oncology Care)  Goal: Effective Coping  Outcome: Ongoing (see interventions/notes)     Problem: Fatigue (Oncology  Care)  Goal: Improved Activity Tolerance  Outcome: Ongoing (see interventions/notes)     Problem: Pain Acute (Oncology Care)  Goal: Optimal Pain Control  Outcome: Ongoing (see interventions/notes)

## 2018-06-09 NOTE — Discharge Summary (Signed)
[facility]  DISCHARGE SUMMARY    PATIENT NAME:  [name], [name]  MRN:  [id number]  DOB:  1960-08-05    ENCOUNTER DATE:  06/08/2018  INPATIENT ADMISSION DATE: 06/08/2018  DISCHARGE DATE:  06/09/2018    ATTENDING PHYSICIAN: Lucita Ferrara, MD  SERVICE: NEUROSURGERY 2  PRIMARY CARE PHYSICIAN: Isac Caddy, MD         LAY CAREGIVER:  ,  ,        PRIMARY DISCHARGE DIAGNOSIS: Meningioma (CMS [facility])  Active Hospital Problems    Diagnosis Date Noted   . 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 Encounter for HCV screening test for high risk patient [Z11.59, Z91.89] 06/08/2018      Resolved Hospital Problems   No resolved problems to display.     There are no active non-hospital problems to display for this patient.       DISCHARGE MEDICATIONS:     Current Discharge Medication List      START taking these medications.      Details   acetaminophen 325 mg Tablet  Commonly known as:  TYLENOL   650 mg, Oral, EVERY 6 HOURS PRN  Refills:  0        CONTINUE these medications - NO CHANGES were made during your visit.      Details   diphenoxylate-atropine 2.5-0.025 mg Tablet  Commonly known as:  LOMOTIL   DAILY PRN  Refills:  2     metoprolol tartrate 25 mg Tablet  Commonly known as:  LOPRESSOR   25 mg, Oral, 2 TIMES DAILY  Refills:  0     ondansetron 8 mg Tablet  Commonly known as:  ZOFRAN   8 mg, Oral, 2 TIMES DAILY PRN  Refills:  0     quinapril 40 mg Tablet  Commonly known as:  ACCUPRIL   40 mg, Oral, DAILY  Refills:  2          Discharge med list refreshed?  YES                ALLERGIES:  No Known Allergies          HOSPITAL PROCEDURE(S):   Bedside Procedures:  No orders of the defined types were placed in this encounter.    Surgical     REASON FOR HOSPITALIZATION AND HOSPITAL COURSE     BRIEF HPI:  [name] is a 57 year-old female who reports progressive right upper and lower extremity weakness over the past 3-4 years. States that the weakness is progressive in nature and has  begun to impact her ADLs, resulting in falls due to her right leg giving out. States her weakness is continuous and constant. No major weakness noted on the left side. Denies any numbness on the right side with the exception of the sole of her right foot. No changes in speech or vision.     She was evaluated by Dr. Knox Royalty, neurology, in the outpatient setting on 05/21/18 who ordered an MRI of the brain and cervical spine due to the presence of the right-sided weakness in the context of upper motor neuron signs on the right side (Hoffman, Babinski, hyper-reflexia). 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 Due to the findings, she was called and advised to come to the ED to seek evaluation.    BRIEF HOSPITAL NARRATIVE: Patient was admitted to the neurosurgery service. While inpatient she  underwent CTA intracranial, CTV, and CT C/A/P. Patient was agreeable to further workup in the outpatient setting and case was reviewed with Dr. Margart Sickles who advised that the patient should follow up on 06/11/18.    The patient was determined safe for discharge on 06/09/18.  Prior to discharge the patient was tolerating a diet, her pain was well controlled on oral pain medications, and she was ambulating well and physical therapy felt she was safe for discharge home with 24/7 assistance and tub bench.  The patient will follow up with Neurosurgery on 06/11/18 for reevaluation.  All questions were answered prior to discharge and the patient agreed to be discharged at this time. The patient was instructed to follow up sooner for new or concerning symptoms.        TRANSITION/POST DISCHARGE CARE/PENDING TESTS/REFERRALS:  -- follow up with Dr. Margart Sickles on 06/11/18        CONDITION ON DISCHARGE:  A. Ambulation: Full ambulation  B. Self-care Ability: With partial assistance  C. Cognitive Status Alert and Oriented x 3  D. Code status at discharge:   Code Status Information      Code Status    Full Code                 LINES/DRAINS/WOUNDS AT DISCHARGE:   Patient Lines/Drains/Airways Status    Active Line / Dialysis Catheter / Dialysis Graft / Drain / Airway / Wound     None                DISCHARGE DISPOSITION:  Home discharge    NEUROSURGERY RISK FACTORS:  Brain Compression          DISCHARGE INSTRUCTIONS:  [place] .    Specialty:  Neurosurgery  Contact information:  [address]   [place] [address]  [phone]  Additional information:  For driving directions to the [facility] in [place], please call [phone]). You may also visit our website at [email or web address]*Valet parking is available to patients at [facility] for free and tipping is not required.*Visitors to our main campus will Location manager as we are expanding to better serve you. We apologize for any inconvenience this may cause and appreciate your patience.                  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    Start Date 06/09/2018                 Keane Police, MD      Copies sent to Care Team       Relationship Specialty Notifications Start End    Isac Caddy, MD PCP - General EXTERNAL  03/14/18     Phone: [phone] Fax: [phone]         [place] [address]  Referring providers can utilize [email or web address] to access their referred [place] patient's  information.

## 2018-06-09 NOTE — Progress Notes (Signed)
[facility]  NEUROSURGERY   PROGRESS NOTE    [name], [name], 57 y.o. female  Date of Admission:  06/08/2018  Date of Service: 06/09/2018  Date of Birth:  May 12, 1961    Chief Complaint: progressive RUE and RLE weakness    Subjective:   No acute events overnight.    Vital Signs:  Temp (24hrs) Max:37 C (16.1 F)      Systolic (09UEA), VWU:981 , Min:107 , XBJ:478     Diastolic (29FAO), ZHY:86, Min:65, Max:165    Temp  Avg: 36.8 C (98.3 F)  Min: 36.6 C (97.8 F)  Max: 37 C (98.6 F)  MAP (Non-Invasive)  Avg: 102.6 mmHG  Min: 78 mmHG  Max: 124 mmHG  Pulse  Avg: 77.2  Min: 63  Max: 88  Resp  Avg: 16.9  Min: 12  Max: 23  SpO2  Avg: 94.4 %  Min: 92 %  Max: 97 %  Pain Score (Numeric, Faces): 5    Min/Max/Avg ICP/CPP last 24hrs:   No data recorded    Today's Physical Exam:    Appears stated age, NAD  A&Ox3  GCS 4 6 5     Fluent speech  Appropriate fund of knowledge  Appropriate attention span & concentration  Appropriate recent and remote memory    CN 2 PERRL  CN 3 4 6  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 Laural Golden, M.D.  PGY-4 Neurosurgery  06/09/2018, 10:51       Late entry for 06/09/2018. I did review the case and images  with Dr Laural Golden.  I reviewed the resident's note.  I agree with the findings and plan of care as documented in the resident's note.  Any exceptions/additions are edited/noted.  Patient will be followed by Dr Hulan Saas at neurosurgery clinics.    Vincente Poli, MD

## 2018-06-11 ENCOUNTER — Ambulatory Visit: Payer: PPO | Attending: Neurological Surgery | Admitting: Anesthesiology

## 2018-06-11 ENCOUNTER — Ambulatory Visit (HOSPITAL_COMMUNITY): Payer: Self-pay

## 2018-06-11 ENCOUNTER — Encounter (INDEPENDENT_AMBULATORY_CARE_PROVIDER_SITE_OTHER): Payer: Self-pay | Admitting: Anesthesiology

## 2018-06-11 VITALS — BP 120/82 | HR 65 | Temp 97.9°F | Ht 66.3 in | Wt 175.5 lb

## 2018-06-11 DIAGNOSIS — R292 Abnormal reflex: Secondary | ICD-10-CM | POA: Insufficient documentation

## 2018-06-11 DIAGNOSIS — W19XXXA Unspecified fall, initial encounter: Secondary | ICD-10-CM

## 2018-06-11 DIAGNOSIS — R42 Dizziness and giddiness: Secondary | ICD-10-CM | POA: Insufficient documentation

## 2018-06-11 DIAGNOSIS — R531 Weakness: Secondary | ICD-10-CM | POA: Insufficient documentation

## 2018-06-11 DIAGNOSIS — R519 Headache, unspecified: Secondary | ICD-10-CM

## 2018-06-11 DIAGNOSIS — R51 Headache: Secondary | ICD-10-CM | POA: Insufficient documentation

## 2018-06-11 DIAGNOSIS — D329 Benign neoplasm of meninges, unspecified: Secondary | ICD-10-CM | POA: Insufficient documentation

## 2018-06-11 NOTE — Progress Notes (Signed)
[facility]  Return Outpatient Note    Date:  06/11/2018  Age:  57 y.o.  Referring Physician:   Nani [name], [name] Hummer, MD  [address]  [place], [place] [address]    Subjective:   [name] is [name] 57 year old female presenting to clinic for follow up hospital admission for worsening right sided weakness.  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 Plan was for outpatient follow up.     The patient reports 2-3 year history of right upper and lower extremity weakness. She was seen by [name] on 05/21/18. Exam findings included right Hoffman and Babinski with 3+ reflexes. MRI brain and C spine were ordered. The patient later presented to [facility] ED with c/o worsening right sided weakness. 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     Current Outpatient Medications   Medication Sig   . acetaminophen (TYLENOL) 325 mg Oral Tablet Take 2 Tabs (650 mg total) by mouth Every 6 hours as needed for Pain   . diphenoxylate-atropine (LOMOTIL) 2.5-0.025 mg Oral Tablet Once per day as needed   . metoprolol tartrate (LOPRESSOR) 25 mg Oral Tablet Take 25 mg by mouth Twice daily   . ondansetron (ZOFRAN) 8 mg Oral Tablet Take 8 mg by mouth Twice per day as needed      . quinapril (ACCUPRIL) 40 mg Oral Tablet Take 40 mg by mouth Once [place] See day     No Known Allergies     Past Medical History:   Diagnosis Date   . Arthropathy    . HTN (hypertension)    . Hyperlipemia      Past Surgical History:   Procedure Laterality Date   . HX APPENDECTOMY     . HX LAP CHOLECYSTECTOMY     . HX LUMBAR DISKECTOMY  1999     Family Medical History:     Problem Relation (Age of Onset)    Ehlers-Danlos syndrome Daughter    Heart Attack Mother    Lymphoma Mother          ROS:  Reports indigestion, head ache, dizziness, right sided weakness, and vision changes. Denies chest pain, fever, chills, or further ROS .     Objective:   Vital Signs:  BP 120/82   Pulse 65   Temp 36.6 C (97.9 F) (Thermal Scan)   Ht 1.684 m (5' 6.3")   Wt 79.6 kg (175 lb 7.8 oz)   SpO2 95%   BMI 28.07 kg/m       Constitutional  General appearance: In no acute distress   Eyes: Ophthalmic exam of optic discs and posterior segments:  baseline impaired R eye abduction  Cardiovascular:   Lungs CTA  RRR  HEENT:    HEENT:  Normal  Musculoskeletal  Gait and Station: : Unsteady   Muscle strength (  upper extremities): : 5/5 left, 3/5 right Interossei and deltoid, 4/5 right wrist and bicep    Muscle strength (lower extremities): : 5/5 left, 4/5 right except 3/5 plantar flexion, right AFO   Muscle tone (upper extremities): : Normal  Muscle tone (lower extremities): : Normal  Sensation: intact   Coordination: Not done  Hoffman's reflex: Left: negative Right: positive  Ankle clonus:Left : Not present Right Not present  Babinski: Left: Absent  Right: Negative   Neurological  Orientation: Normal  Recent and remote memory: Normal  Attention span and concentration: Normal  Language: Normal  Fund of knowledge: Normal    Cranial Nerves  2nd: PERRL   3rd,4th,6th:  baseline impaired R eye abduction  5th: Normal  7th: Normal  8th: Normal  9th: Normal  11th: Normal  12th: Normal    Data reviewed  06/08/18 MRI brain [place] PACS:     IMPRESSION:  1. [name] 4 x  2.5 x 4 cm large left tentorial mass lesion with mass effect on  the left thalamus, left cerebral peduncle with the imaging findings  suggestive of [name] as it demonstrate [name] tentorial tail and  asymmetric thickening of the left tentorium. There is also effacement of  the medial left temporal lobe however no significant surrounding vasogenic  edema identified within the brain.  2. [name] filling defect identified within the left transverse sinus, this does  not demonstrate suppression on the FLAIR images but at the same time that  does not demonstrate blooming on the blood susceptible sequence, this may  represent [name] brain tissue, as it appears to follow the signal intensity of  the adjacent cortical brain. On another consideration is chronic venous  thrombus.  it does not follow the signal intensity of CSF to suggest [name]  arachnoid granulations nor does demonstrate enhancement to suggest [name]  [name].  3. [name] 4 to 5 mm focus of enhancement identified along the intraorbital  segment of the right optic nerve which may represent [name] optic sheath  [name]. No significant mass effect.    06/08/18 MRI  cervical spine [place] PACS:  IMPRESSION:  Advanced degenerative changes noted to the cervical spine, as detailed  above most pronounced at the C4-C5 and C5-C6 level with moderate to severe  spinal canal stenosis with contouring and effacement of the cervical spinal  cord. No cord edema. No abnormal enhancement.    06/08/18 CTA [place] PACS:    IMPRESSION:  Large left tentorial [name] is again demonstrated with unchanged size  and mass effect when compared to MRI earlier the same day. Mass laterally  displaces the left posterior cerebral artery which remains patent.    Redemonstration of filling defect within the left transverse sinus, likely  representing chronic thrombus as seen on recent MRI.    Discussions with other providers:   Reviewed chart notes     Assessment:      ICD-10-CM    1. L tentorial [name] D32.9 BUN       CBC/DIFF     CREATININE     ELECTROLYTES     PT/INR     ROUTINE PTT     TYPE AND SCREEN     URINALYSIS, MACROSCOPIC AND MICROSCOPIC W/CULTURE REFLEX     XR CHEST PA AND LATERAL     ECG 12-LEAD (PERFORMED IN PREADMISSION UNIT ONLY)     CT LAB BRAIN WO IV CONTRAST   2. [place] W19.XXXA BUN     CBC/DIFF     CREATININE  ELECTROLYTES     PT/INR     ROUTINE PTT     TYPE AND SCREEN     URINALYSIS, MACROSCOPIC AND MICROSCOPIC W/CULTURE REFLEX     XR CHEST PA AND LATERAL     ECG 12-LEAD (PERFORMED IN PREADMISSION UNIT ONLY)     CT LAB BRAIN WO IV CONTRAST   3. Right sided weakness R53.1 BUN     CBC/DIFF     CREATININE     ELECTROLYTES     PT/INR     ROUTINE PTT     TYPE AND SCREEN     URINALYSIS, MACROSCOPIC AND MICROSCOPIC W/CULTURE REFLEX     XR CHEST PA AND LATERAL     ECG 12-LEAD (PERFORMED IN PREADMISSION UNIT ONLY)     CT LAB BRAIN WO IV CONTRAST   4. Dizziness R42 BUN     CBC/DIFF     CREATININE     ELECTROLYTES     PT/INR     ROUTINE PTT     TYPE AND SCREEN     URINALYSIS, MACROSCOPIC AND MICROSCOPIC W/CULTURE REFLEX     XR CHEST PA AND LATERAL     ECG 12-LEAD (PERFORMED IN PREADMISSION UNIT ONLY)     CT LAB BRAIN WO IV CONTRAST   5. Head ache R51 BUN     CBC/DIFF     CREATININE     ELECTROLYTES     PT/INR     ROUTINE PTT     TYPE AND SCREEN     URINALYSIS, MACROSCOPIC AND MICROSCOPIC W/CULTURE REFLEX     XR CHEST PA AND LATERAL     ECG 12-LEAD (PERFORMED IN PREADMISSION UNIT ONLY)     CT LAB BRAIN WO IV CONTRAST   6. Hoffman sign present R29.2 BUN     CBC/DIFF     CREATININE     ELECTROLYTES     PT/INR     ROUTINE PTT     TYPE AND SCREEN     URINALYSIS, MACROSCOPIC AND MICROSCOPIC W/CULTURE REFLEX     XR CHEST PA AND LATERAL     ECG 12-LEAD (PERFORMED IN PREADMISSION UNIT ONLY)     CT LAB BRAIN WO IV CONTRAST       Recommendations  Schedule left temporal craniotomy for excision of tumor, subtemporal approach   Full PAT with CT lab brain   Return to clinic after surgery     Cheral Marker, APRN,FNP-BC 06/11/2018,  11:36    With Dr. Margart Sickles

## 2018-06-11 NOTE — Progress Notes (Signed)
I personally saw and evaluated the patient. See mid-level's note for additional details. My findings/participation are : Large left tentorial meningioma, with significant brainstem and basal ganglia compression. I offered her surgery. Risks, benefits, indications and procedures discussed and consent obtained.    [name], MD

## 2018-06-13 ENCOUNTER — Ambulatory Visit (INDEPENDENT_AMBULATORY_CARE_PROVIDER_SITE_OTHER): Payer: Self-pay | Admitting: Anesthesiology

## 2018-06-13 NOTE — Telephone Encounter (Addendum)
-----   Message from Dorann Ou sent at 06/10/2018  8:18 AM EST -----  [name] needs a hosp disc scheduled asap      According to the order she is to see Dr Margart Sickles     His next available RPV is 07/02/18, but [name] states this is too long that she is to have surgery within 2 weeks     Please call Kim at [phone] or [phone]  ----------------------------------------------------------------------------------  I called and spoke with the patient. She saw Dr. Margart Sickles and has surgery scheduled.  Hamilton Capri, RN  06/13/2018, 16:14

## 2018-06-25 ENCOUNTER — Encounter (HOSPITAL_COMMUNITY): Payer: Self-pay

## 2018-06-25 ENCOUNTER — Ambulatory Visit
Admission: RE | Admit: 2018-06-25 | Discharge: 2018-06-25 | Disposition: A | Discharge status: Home / Self Care | Payer: PPO | Source: Ambulatory Visit | Attending: Anesthesiology | Admitting: Anesthesiology

## 2018-06-25 ENCOUNTER — Ambulatory Visit (HOSPITAL_COMMUNITY)
Admission: RE | Admit: 2018-06-25 | Discharge: 2018-06-25 | Disposition: A | Discharge status: Home / Self Care | Payer: PPO | Source: Ambulatory Visit

## 2018-06-25 DIAGNOSIS — R519 Headache, unspecified: Secondary | ICD-10-CM

## 2018-06-25 DIAGNOSIS — R531 Weakness: Secondary | ICD-10-CM | POA: Insufficient documentation

## 2018-06-25 DIAGNOSIS — D329 Benign neoplasm of meninges, unspecified: Secondary | ICD-10-CM | POA: Insufficient documentation

## 2018-06-25 DIAGNOSIS — R51 Headache: Secondary | ICD-10-CM

## 2018-06-25 DIAGNOSIS — R292 Abnormal reflex: Secondary | ICD-10-CM | POA: Insufficient documentation

## 2018-06-25 DIAGNOSIS — R42 Dizziness and giddiness: Secondary | ICD-10-CM | POA: Insufficient documentation

## 2018-06-25 DIAGNOSIS — W19XXXA Unspecified fall, initial encounter: Secondary | ICD-10-CM | POA: Insufficient documentation

## 2018-06-25 HISTORY — DX: Shortness of breath: R06.02

## 2018-06-25 HISTORY — DX: Gastro-esophageal reflux disease without esophagitis: K21.9

## 2018-06-25 HISTORY — DX: Presence of spectacles and contact lenses: Z97.3

## 2018-06-25 HISTORY — DX: Other forms of dyspnea: R06.09

## 2018-06-25 HISTORY — DX: Malignant (primary) neoplasm, unspecified (CMS HCC): C80.1

## 2018-06-25 HISTORY — DX: Anxiety disorder, unspecified: F41.9

## 2018-06-25 HISTORY — DX: Palpitations: R00.2

## 2018-06-25 HISTORY — DX: Dyspnea, unspecified: R06.00

## 2018-06-25 HISTORY — DX: Muscle weakness (generalized): M62.81

## 2018-06-25 HISTORY — DX: Cardiac murmur, unspecified: R01.1

## 2018-06-25 LAB — POC BLOOD GLUCOSE (RESULTS)
GLUCOSE, POC: 95 mg/dL (ref 70–105)
GLUCOSE, POC: 95 mg/dl (ref 70–105)

## 2018-06-25 NOTE — Anesthesia Preprocedure Evaluation (Addendum)
ANESTHESIA PRE-OP EVALUATION  Planned Procedure: CRANIOTOMY FOR TUMOR WITH NAVIGATION (Left )  CT SCAN (N/A )  Review of Systems                   Pulmonary     Cardiovascular    No peripheral edema,        GI/Hepatic/Renal        Endo/Other          Neuro/Psych/MS        Cancer                     Physical Assessment      Airway       Mallampati: I    TM distance: >3 FB    Neck ROM: full  Mouth Opening: good.      No endotracheal tube present  No Tracheostomy present    Dental       Dentition intact             Pulmonary    Breath sounds clear to auscultation       Cardiovascular    Rhythm: regular  Rate: Normal  (-) carotid bruit is not present, no peripheral edema and no murmur     Other findings            Plan  Planned anesthesia type: general        ASA 3     Intravenous induction   Arterial line  Anesthetic plan and risks discussed with patient.     Anesthesia issues/risks discussed are: Dental Injuries, PONV, Post-op Intubation/Ventilation, Post-op Pain Management, Art Line Placement, Cardiac Events/MI, Stroke, Intraoperative Awareness/ Recall, Sore Throat, Blood Loss and Post-op Cognitive Dysfunction.    Use of blood products discussed with patient who consented to blood products.     Patient's NPO status is appropriate for Anesthesia.           Plan discussed with CRNA.                 EKG: Within last six months  06/08/2018   Sinus bradycardia  Left ventricular hypertrophy with repolarization abnormality  Abnormal ECG      TTE 03/14/2018  Conclusions:  1. Left ventricle is normal in size. There is concentric hypertrophy of the left ventricle. There is no  evidence of any wall motion abnormality. The left ventricular systolic function normal with estimated  ejection fraction 56%. There is mild primary mitral regurgitation. There is mild tricuspid regurgitation. All  the cardiac chambers are within normal limits        CT Chest 06/08/2018  Lungs/Pleura: The lungs are clear without focal consolidation. There  is a  small perifissural nodule in the anterior portion of the right lower lobe.   The central airways are patent. No pleural effusion or pneumothorax is  present.      Labs         Consults: None    Patient instructed to take the following medications day of surgery, metoprolol, Zofran if need.  Hold quinapril on day of surgery.  Patient instructed to avoid NSAIDs, vitamins, supplements, and fish oil 7 days prior to surgery.   Patient provided anesthesia consent in PEC. Instructed to review consent prior to OR date. Educated that consent will be signed morning of surgery with anesthesiologist

## 2018-07-05 ENCOUNTER — Inpatient Hospital Stay
Admission: EM | Admit: 2018-07-05 | Discharge: 2018-08-07 | DRG: 003 | Disposition: A | Discharge status: Skilled Nursing Facility | Payer: PPO | Source: Ambulatory Visit | Attending: Anesthesiology | Admitting: Anesthesiology

## 2018-07-05 ENCOUNTER — Encounter (HOSPITAL_COMMUNITY): Payer: Self-pay

## 2018-07-05 ENCOUNTER — Inpatient Hospital Stay (HOSPITAL_COMMUNITY): Payer: PPO | Admitting: NURSE PRACTITIONER

## 2018-07-05 ENCOUNTER — Ambulatory Visit (HOSPITAL_COMMUNITY): Payer: PPO

## 2018-07-05 ENCOUNTER — Inpatient Hospital Stay (HOSPITAL_COMMUNITY)

## 2018-07-05 ENCOUNTER — Inpatient Hospital Stay (HOSPITAL_BASED_OUTPATIENT_CLINIC_OR_DEPARTMENT_OTHER): Payer: PPO | Admitting: Anesthesiology

## 2018-07-05 ENCOUNTER — Inpatient Hospital Stay (HOSPITAL_COMMUNITY): Payer: PPO | Admitting: Anesthesiology

## 2018-07-05 ENCOUNTER — Other Ambulatory Visit (HOSPITAL_COMMUNITY): Payer: Self-pay | Admitting: Anesthesiology

## 2018-07-05 ENCOUNTER — Inpatient Hospital Stay (HOSPITAL_COMMUNITY): Payer: PPO | Admitting: Certified Registered"

## 2018-07-05 ENCOUNTER — Encounter (HOSPITAL_COMMUNITY)
Admission: EM | Disposition: A | Discharge status: Skilled Nursing Facility | Payer: Self-pay | Source: Ambulatory Visit | Attending: Anesthesiology

## 2018-07-05 ENCOUNTER — Inpatient Hospital Stay (HOSPITAL_BASED_OUTPATIENT_CLINIC_OR_DEPARTMENT_OTHER): Payer: PPO | Admitting: Certified Registered"

## 2018-07-05 ENCOUNTER — Inpatient Hospital Stay (HOSPITAL_COMMUNITY): Payer: PPO

## 2018-07-05 DIAGNOSIS — G9751 Postprocedural hemorrhage and hematoma of a nervous system organ or structure following a nervous system procedure: Secondary | ICD-10-CM | POA: Diagnosis not present

## 2018-07-05 DIAGNOSIS — D62 Acute posthemorrhagic anemia: Secondary | ICD-10-CM | POA: Diagnosis not present

## 2018-07-05 DIAGNOSIS — G40911 Epilepsy, unspecified, intractable, with status epilepticus: Secondary | ICD-10-CM | POA: Diagnosis not present

## 2018-07-05 DIAGNOSIS — E871 Hypo-osmolality and hyponatremia: Secondary | ICD-10-CM | POA: Diagnosis not present

## 2018-07-05 DIAGNOSIS — G936 Cerebral edema: Secondary | ICD-10-CM | POA: Diagnosis present

## 2018-07-05 DIAGNOSIS — D696 Thrombocytopenia, unspecified: Secondary | ICD-10-CM | POA: Diagnosis not present

## 2018-07-05 DIAGNOSIS — I82452 Acute embolism and thrombosis of left peroneal vein: Secondary | ICD-10-CM | POA: Clinically undetermined

## 2018-07-05 DIAGNOSIS — I4891 Unspecified atrial fibrillation: Secondary | ICD-10-CM

## 2018-07-05 DIAGNOSIS — I10 Essential (primary) hypertension: Secondary | ICD-10-CM | POA: Diagnosis present

## 2018-07-05 DIAGNOSIS — R292 Abnormal reflex: Secondary | ICD-10-CM

## 2018-07-05 DIAGNOSIS — D329 Benign neoplasm of meninges, unspecified: Secondary | ICD-10-CM | POA: Diagnosis present

## 2018-07-05 DIAGNOSIS — D320 Benign neoplasm of cerebral meninges: Principal | ICD-10-CM | POA: Diagnosis present

## 2018-07-05 DIAGNOSIS — R531 Weakness: Secondary | ICD-10-CM

## 2018-07-05 DIAGNOSIS — K219 Gastro-esophageal reflux disease without esophagitis: Secondary | ICD-10-CM | POA: Diagnosis present

## 2018-07-05 DIAGNOSIS — F17210 Nicotine dependence, cigarettes, uncomplicated: Secondary | ICD-10-CM | POA: Diagnosis present

## 2018-07-05 DIAGNOSIS — L02411 Cutaneous abscess of right axilla: Secondary | ICD-10-CM | POA: Diagnosis not present

## 2018-07-05 DIAGNOSIS — R402114 Coma scale, eyes open, never, 24 hours or more after hospital admission: Secondary | ICD-10-CM | POA: Diagnosis not present

## 2018-07-05 DIAGNOSIS — R42 Dizziness and giddiness: Secondary | ICD-10-CM

## 2018-07-05 DIAGNOSIS — W19XXXA Unspecified fall, initial encounter: Secondary | ICD-10-CM

## 2018-07-05 DIAGNOSIS — E870 Hyperosmolality and hypernatremia: Secondary | ICD-10-CM | POA: Diagnosis not present

## 2018-07-05 DIAGNOSIS — F17200 Nicotine dependence, unspecified, uncomplicated: Secondary | ICD-10-CM

## 2018-07-05 DIAGNOSIS — R402214 Coma scale, best verbal response, none, 24 hours or more after hospital admission: Secondary | ICD-10-CM | POA: Diagnosis not present

## 2018-07-05 DIAGNOSIS — I639 Cerebral infarction, unspecified: Secondary | ICD-10-CM | POA: Diagnosis not present

## 2018-07-05 DIAGNOSIS — A419 Sepsis, unspecified organism: Secondary | ICD-10-CM | POA: Diagnosis not present

## 2018-07-05 DIAGNOSIS — E785 Hyperlipidemia, unspecified: Secondary | ICD-10-CM | POA: Diagnosis present

## 2018-07-05 DIAGNOSIS — R519 Headache, unspecified: Secondary | ICD-10-CM

## 2018-07-05 DIAGNOSIS — J9811 Atelectasis: Secondary | ICD-10-CM | POA: Diagnosis not present

## 2018-07-05 DIAGNOSIS — G935 Compression of brain: Secondary | ICD-10-CM | POA: Diagnosis present

## 2018-07-05 DIAGNOSIS — R6521 Severe sepsis with septic shock: Secondary | ICD-10-CM | POA: Diagnosis not present

## 2018-07-05 DIAGNOSIS — I8290 Acute embolism and thrombosis of unspecified vein: Secondary | ICD-10-CM

## 2018-07-05 DIAGNOSIS — Z9049 Acquired absence of other specified parts of digestive tract: Secondary | ICD-10-CM

## 2018-07-05 DIAGNOSIS — Z01818 Encounter for other preprocedural examination: Secondary | ICD-10-CM

## 2018-07-05 DIAGNOSIS — R51 Headache: Secondary | ICD-10-CM

## 2018-07-05 DIAGNOSIS — I82611 Acute embolism and thrombosis of superficial veins of right upper extremity: Secondary | ICD-10-CM | POA: Clinically undetermined

## 2018-07-05 DIAGNOSIS — R609 Edema, unspecified: Secondary | ICD-10-CM

## 2018-07-05 DIAGNOSIS — L03111 Cellulitis of right axilla: Secondary | ICD-10-CM | POA: Diagnosis not present

## 2018-07-05 DIAGNOSIS — R402344 Coma scale, best motor response, flexion withdrawal, 24 hours or more after hospital admission: Secondary | ICD-10-CM | POA: Diagnosis not present

## 2018-07-05 DIAGNOSIS — G9340 Encephalopathy, unspecified: Secondary | ICD-10-CM | POA: Diagnosis not present

## 2018-07-05 DIAGNOSIS — J9601 Acute respiratory failure with hypoxia: Secondary | ICD-10-CM | POA: Diagnosis not present

## 2018-07-05 DIAGNOSIS — G8191 Hemiplegia, unspecified affecting right dominant side: Secondary | ICD-10-CM | POA: Diagnosis present

## 2018-07-05 DIAGNOSIS — E878 Other disorders of electrolyte and fluid balance, not elsewhere classified: Secondary | ICD-10-CM | POA: Diagnosis not present

## 2018-07-05 DIAGNOSIS — J189 Pneumonia, unspecified organism: Secondary | ICD-10-CM | POA: Diagnosis not present

## 2018-07-05 DIAGNOSIS — G08 Intracranial and intraspinal phlebitis and thrombophlebitis: Secondary | ICD-10-CM | POA: Diagnosis present

## 2018-07-05 DIAGNOSIS — E876 Hypokalemia: Secondary | ICD-10-CM

## 2018-07-05 DIAGNOSIS — I82409 Acute embolism and thrombosis of unspecified deep veins of unspecified lower extremity: Secondary | ICD-10-CM

## 2018-07-05 LAB — ARTERIAL BLOOD GAS/LACTATE/CO-OX/LYTES (NA/K/CA/CL/GLUC) (TEMP COMP) - ORS ONLY
(T) PCO2: 36 mmHg (ref 35.0–45.0)
(T) PCO2: 37 mmHg (ref 35.0–45.0)
(T) PCO2: 38 mmHg (ref 35.0–45.0)
(T) PCO2: 39 mmHg (ref 35.0–45.0)
(T) PCO2: 39 mmHg (ref 35.0–45.0)
(T) PCO2: 39 mmHg (ref 35.0–45.0)
(T) PO2: 123 mmHg — ABNORMAL HIGH (ref 72.0–100.0)
(T) PO2: 181 mmHg — ABNORMAL HIGH (ref 72.0–100.0)
(T) PO2: 187 mmHg — ABNORMAL HIGH (ref 72.0–100.0)
(T) PO2: 198 mmHg — ABNORMAL HIGH (ref 72.0–100.0)
(T) PO2: 207 mmHg — ABNORMAL HIGH (ref 72.0–100.0)
(T) PO2: 245 mmHg — ABNORMAL HIGH (ref 72.0–100.0)
BASE DEFICIT: 2.5 mmol/L (ref 0.0–3.0)
BASE DEFICIT: 4.8 mmol/L — ABNORMAL HIGH (ref 0.0–3.0)
BASE DEFICIT: 4.9 mmol/L — ABNORMAL HIGH (ref 0.0–3.0)
BASE DEFICIT: 5.1 mmol/L — ABNORMAL HIGH (ref 0.0–3.0)
BASE DEFICIT: 5.8 mmol/L — ABNORMAL HIGH (ref 0.0–3.0)
BASE DEFICIT: 6.2 mmol/L — ABNORMAL HIGH (ref 0.0–3.0)
BICARBONATE (ARTERIAL): 20.1 mmol/L (ref 18.0–26.0)
BICARBONATE (ARTERIAL): 20.4 mmol/L (ref 18.0–26.0)
BICARBONATE (ARTERIAL): 20.9 mmol/L (ref 18.0–26.0)
BICARBONATE (ARTERIAL): 21.1 mmol/L (ref 18.0–26.0)
BICARBONATE (ARTERIAL): 21.1 mmol/L (ref 18.0–26.0)
BICARBONATE (ARTERIAL): 22.9 mmol/L (ref 18.0–26.0)
CARBOXYHEMOGLOBIN: 0 % (ref 0.0–2.5)
CARBOXYHEMOGLOBIN: 1.8 % (ref 0.0–2.5)
CARBOXYHEMOGLOBIN: 2.4 % (ref 0.0–2.5)
CARBOXYHEMOGLOBIN: 2.6 % — ABNORMAL HIGH (ref 0.0–2.5)
CARBOXYHEMOGLOBIN: 3.8 % — ABNORMAL HIGH (ref 0.0–2.5)
CARBOXYHEMOGLOBIN: 3.8 % — ABNORMAL HIGH (ref 0.0–2.5)
CHLORIDE: 109 mmol/L (ref 101–111)
CHLORIDE: 109 mmol/L (ref 101–111)
CHLORIDE: 109 mmol/L (ref 101–111)
CHLORIDE: 112 mmol/L — ABNORMAL HIGH (ref 101–111)
CHLORIDE: 112 mmol/L — ABNORMAL HIGH (ref 101–111)
CHLORIDE: 112 mmol/L — ABNORMAL HIGH (ref 101–111)
GLUCOSE: 103 mg/dL (ref 60–105)
GLUCOSE: 138 mg/dL — ABNORMAL HIGH (ref 60–105)
GLUCOSE: 138 mg/dL — ABNORMAL HIGH (ref 60–105)
GLUCOSE: 145 mg/dL — ABNORMAL HIGH (ref 60–105)
GLUCOSE: 159 mg/dL — ABNORMAL HIGH (ref 60–105)
GLUCOSE: 167 mg/dL — ABNORMAL HIGH (ref 60–105)
HEMOGLOBIN: 10.3 g/dL — ABNORMAL LOW (ref 12.0–18.0)
HEMOGLOBIN: 11.9 g/dL — ABNORMAL LOW (ref 12.0–18.0)
HEMOGLOBIN: 12.1 g/dL (ref 12.0–18.0)
HEMOGLOBIN: 12.2 g/dL (ref 12.0–18.0)
HEMOGLOBIN: 13 g/dL (ref 12.0–18.0)
HEMOGLOBIN: 13.2 g/dL (ref 12.0–18.0)
IONIZED CALCIUM: 1.08 mmol/L — ABNORMAL LOW (ref 1.10–1.35)
IONIZED CALCIUM: 1.09 mmol/L — ABNORMAL LOW (ref 1.10–1.35)
IONIZED CALCIUM: 1.11 mmol/L (ref 1.10–1.35)
IONIZED CALCIUM: 1.14 mmol/L (ref 1.10–1.35)
IONIZED CALCIUM: 1.15 mmol/L (ref 1.10–1.35)
IONIZED CALCIUM: 1.18 mmol/L (ref 1.10–1.35)
LACTATE: 1.6 mmol/L — ABNORMAL HIGH (ref 0.0–1.3)
LACTATE: 2.9 mmol/L — ABNORMAL HIGH (ref 0.0–1.3)
LACTATE: 3.1 mmol/L — ABNORMAL HIGH (ref 0.0–1.3)
LACTATE: 3.1 mmol/L — ABNORMAL HIGH (ref 0.0–1.3)
LACTATE: 3.3 mmol/L — ABNORMAL HIGH (ref 0.0–1.3)
LACTATE: 3.3 mmol/L — ABNORMAL HIGH (ref 0.0–1.3)
MET-HEMOGLOBIN: 0 % (ref 0.0–2.0)
MET-HEMOGLOBIN: 0.3 % (ref 0.0–2.0)
MET-HEMOGLOBIN: 0.4 % (ref 0.0–2.0)
MET-HEMOGLOBIN: 0.9 % (ref 0.0–2.0)
MET-HEMOGLOBIN: 1.2 % (ref 0.0–2.0)
MET-HEMOGLOBIN: 1.6 % (ref 0.0–2.0)
O2CT: 14.2 % — ABNORMAL LOW (ref 15.7–24.3)
O2CT: 16.6 % (ref 15.7–24.3)
O2CT: 16.7 % (ref 15.7–24.3)
O2CT: 16.7 % (ref 15.7–24.3)
O2CT: 18 % (ref 15.7–24.3)
O2CT: 18.3 % (ref 15.7–24.3)
OXYHEMOGLOBIN: 95.2 % (ref 85.0–98.0)
OXYHEMOGLOBIN: 95.8 % (ref 85.0–98.0)
OXYHEMOGLOBIN: 95.9 % (ref 85.0–98.0)
OXYHEMOGLOBIN: 96.2 % (ref 85.0–98.0)
OXYHEMOGLOBIN: 96.2 % (ref 85.0–98.0)
OXYHEMOGLOBIN: 96.8 % (ref 85.0–98.0)
PAO2/FIO2 RATIO: 458 (ref <=200)
PCO2 (ARTERIAL): 36 mmHg (ref 35.0–45.0)
PCO2 (ARTERIAL): 37 mmHg (ref 35.0–45.0)
PCO2 (ARTERIAL): 39 mmHg (ref 35.0–45.0)
PCO2 (ARTERIAL): 39 mmHg (ref 35.0–45.0)
PCO2 (ARTERIAL): 39 mmHg (ref 35.0–45.0)
PCO2 (ARTERIAL): 41 mmHg (ref 35.0–45.0)
PH (ARTERIAL): 7.31 — ABNORMAL LOW (ref 7.35–7.45)
PH (ARTERIAL): 7.32 — ABNORMAL LOW (ref 7.35–7.45)
PH (ARTERIAL): 7.33 — ABNORMAL LOW (ref 7.35–7.45)
PH (ARTERIAL): 7.33 — ABNORMAL LOW (ref 7.35–7.45)
PH (ARTERIAL): 7.35 (ref 7.35–7.45)
PH (ARTERIAL): 7.37 (ref 7.35–7.45)
PH (T): 7.31 — ABNORMAL LOW (ref 7.35–7.45)
PH (T): 7.33 — ABNORMAL LOW (ref 7.35–7.45)
PH (T): 7.33 — ABNORMAL LOW (ref 7.35–7.45)
PH (T): 7.34 — ABNORMAL LOW (ref 7.35–7.45)
PH (T): 7.35 (ref 7.35–7.45)
PH (T): 7.38 (ref 7.35–7.45)
PO2 (ARTERIAL): 123 mmHg — ABNORMAL HIGH (ref 72.0–100.0)
PO2 (ARTERIAL): 181 mmHg — ABNORMAL HIGH (ref 72.0–100.0)
PO2 (ARTERIAL): 187 mmHg — ABNORMAL HIGH (ref 72.0–100.0)
PO2 (ARTERIAL): 204 mmHg — ABNORMAL HIGH (ref 72.0–100.0)
PO2 (ARTERIAL): 206 mmHg — ABNORMAL HIGH (ref 72.0–100.0)
PO2 (ARTERIAL): 248 mmHg — ABNORMAL HIGH (ref 72.0–100.0)
SODIUM: 138 mmol/L (ref 137–145)
SODIUM: 138 mmol/L (ref 137–145)
SODIUM: 139 mmol/L (ref 137–145)
SODIUM: 140 mmol/L (ref 137–145)
SODIUM: 141 mmol/L (ref 137–145)
SODIUM: 141 mmol/L (ref 137–145)
TEMPERATURE, COMP: 35.9 C (ref 15.0–40.0)
TEMPERATURE, COMP: 36.3 C (ref 15.0–40.0)
TEMPERATURE, COMP: 37 C (ref 15.0–40.0)
TEMPERATURE, COMP: 37 C (ref 15.0–40.0)
TEMPERATURE, COMP: 37 C (ref 15.0–40.0)
TEMPERATURE, COMP: 37.1 C (ref 15.0–40.0)
WHOLE BLOOD POTASSIUM: 3.3 mmol/L — ABNORMAL LOW (ref 3.5–4.6)
WHOLE BLOOD POTASSIUM: 3.3 mmol/L — ABNORMAL LOW (ref 3.5–4.6)
WHOLE BLOOD POTASSIUM: 3.4 mmol/L — ABNORMAL LOW (ref 3.5–4.6)
WHOLE BLOOD POTASSIUM: 3.6 mmol/L (ref 3.5–4.6)
WHOLE BLOOD POTASSIUM: 3.8 mmol/L (ref 3.5–4.6)
WHOLE BLOOD POTASSIUM: 3.8 mmol/L (ref 3.5–4.6)

## 2018-07-05 LAB — ARTERIAL BLOOD GAS/LACTATE/CO-OX/LYTES (NA/K/CA/CL/GLUC) (TEMP COMP)
%FIO2 (ARTERIAL): 39 %
%FIO2 (ARTERIAL): 44 %
%FIO2 (ARTERIAL): 45 %
%FIO2 (ARTERIAL): 45 %
%FIO2 (ARTERIAL): 48 %
%FIO2 (ARTERIAL): 48 %
%FIO2 (ARTERIAL): 53 %
BICARBONATE (ARTERIAL): 22.9 mmol/L (ref 18.0–26.0)
GLUCOSE: 138 mg/dL — ABNORMAL HIGH (ref 60–105)
IONIZED CALCIUM: 1.14 mmol/L (ref 1.10–1.35)
MET-HEMOGLOBIN: 1.6 % (ref 0.0–2.0)
O2CT: 16.7 % (ref 15.7–24.3)
OXYHEMOGLOBIN: 95.9 % (ref 85.0–98.0)
PAO2/FIO2 RATIO: 315 (ref <=200)
PAO2/FIO2 RATIO: 411 (ref <=200)
PAO2/FIO2 RATIO: 416 (ref <=200)
PAO2/FIO2 RATIO: 416 (ref <=200)
PAO2/FIO2 RATIO: 425 (ref <=200)
PAO2/FIO2 RATIO: 458 (ref <=200)
PAO2/FIO2 RATIO: 468 (ref <=200)
PH (T): 7.33 — ABNORMAL LOW (ref 7.35–7.45)
TEMPERATURE, COMP: 37 C (ref 15.0–40.0)
WHOLE BLOOD POTASSIUM: 3.3 mmol/L — ABNORMAL LOW (ref 3.5–4.6)
WHOLE BLOOD POTASSIUM: 3.8 mmol/L (ref 3.5–4.6)

## 2018-07-05 SURGERY — CRANIOTOMY
Anesthesia: General | Laterality: Left | Wound class: Clean Wound: Uninfected operative wounds in which no inflammation occurred

## 2018-07-05 SURGERY — CRANIOTOMY FOR TUMOR WITH NAVIGATION
Anesthesia: General | Wound class: Clean Wound: Uninfected operative wounds in which no inflammation occurred

## 2018-07-05 MED ORDER — BACITRACIN 500 UNIT/G OINTMENT TUBE
TOPICAL_OINTMENT | CUTANEOUS | Status: AC
Start: 2018-07-05 — End: 2018-07-05
  Filled 2018-07-05: qty 15

## 2018-07-05 MED ORDER — NAFCILLIN 2 GRAM/100 ML IN DEXTROSE(ISO-OSMOTIC) INTRAVENOUS PIGGYBACK
2.0000 g | INJECTION | Freq: Once | INTRAVENOUS | Status: AC
Start: 2018-07-05 — End: 2018-07-05
  Administered 2018-07-05: 2 g via INTRAVENOUS
  Filled 2018-07-05 (×3): qty 100

## 2018-07-05 MED ORDER — REMIFENTANIL 50 MCG/ML INFUSION - FOR ANES
INTRAVENOUS | Status: DC | PRN
Start: 2018-07-05 — End: 2018-07-05
  Administered 2018-07-05: 0.2 ug/kg/min via INTRAVENOUS
  Administered 2018-07-05: 0 ug/kg/min via INTRAVENOUS
  Administered 2018-07-05: 0.15 ug/kg/min via INTRAVENOUS
  Administered 2018-07-05 (×3): 0.1 ug/kg/min via INTRAVENOUS
  Administered 2018-07-05: .125 ug/kg/min via INTRAVENOUS
  Administered 2018-07-05: .075 ug/kg/min via INTRAVENOUS

## 2018-07-05 MED ORDER — EPHEDRINE SULFATE 50 MG/ML INJECTION SOLUTION
Freq: Once | INTRAMUSCULAR | Status: DC | PRN
Start: 2018-07-05 — End: 2018-07-05
  Administered 2018-07-05 (×4): 5 mg via INTRAVENOUS
  Administered 2018-07-05 (×2): 10 mg via INTRAVENOUS

## 2018-07-05 MED ORDER — VANCOMYCIN 1,000 MG INTRAVENOUS INJECTION
INTRAVENOUS | Status: AC
Start: 2018-07-05 — End: 2018-07-05
  Filled 2018-07-05: qty 10

## 2018-07-05 MED ORDER — SODIUM CHLORIDE 0.9 % INTRAVENOUS SOLUTION
INTRAVENOUS | Status: DC | PRN
Start: 2018-07-05 — End: 2018-07-06

## 2018-07-05 MED ORDER — LACTATED RINGERS INTRAVENOUS SOLUTION
INTRAVENOUS | Status: DC | PRN
Start: 2018-07-05 — End: 2018-07-05

## 2018-07-05 MED ORDER — SODIUM CHLORIDE 0.9 % IV BOLUS
40.00 mL | INJECTION | Freq: Once | Status: AC | PRN
Start: 2018-07-05 — End: 2018-07-06

## 2018-07-05 MED ORDER — ELECTROLYTE-A INTRAVENOUS SOLUTION
INTRAVENOUS | Status: DC | PRN
Start: 2018-07-05 — End: 2018-07-05

## 2018-07-05 MED ORDER — GELATIN MATRIX SEALANT (FLOSEAL) 5 ML KIT
PACK | CUTANEOUS | Status: AC
Start: 2018-07-05 — End: 2018-07-05
  Filled 2018-07-05: qty 1

## 2018-07-05 MED ORDER — ONDANSETRON HCL (PF) 4 MG/2 ML INJECTION SOLUTION
Freq: Once | INTRAMUSCULAR | Status: DC | PRN
Start: 2018-07-05 — End: 2018-07-05
  Administered 2018-07-05: 4 mg via INTRAVENOUS

## 2018-07-05 MED ORDER — GELATIN MATRIX SEALANT (FLOSEAL) 10 ML KIT
PACK | CUTANEOUS | Status: AC
Start: 2018-07-05 — End: 2018-07-05
  Filled 2018-07-05: qty 3

## 2018-07-05 MED ORDER — THROMBIN(HUMAN)-FIBRINOGEN-APROTININ-CALCIUM 4 ML TOPICAL KIT
PACK | Freq: Once | CUTANEOUS | Status: DC | PRN
Start: 2018-07-05 — End: 2018-07-06

## 2018-07-05 MED ORDER — SODIUM CHLORIDE 0.9 % (FLUSH) INJECTION SYRINGE
2.0000 mL | INJECTION | INTRAMUSCULAR | Status: DC | PRN
Start: 2018-07-05 — End: 2018-07-30
  Administered 2018-07-08 – 2018-07-10 (×3): 6 mL
  Filled 2018-07-05 (×3): qty 10

## 2018-07-05 MED ORDER — GELATIN MATRIX SEALANT (FLOSEAL) 10 ML KIT
PACK | CUTANEOUS | Status: AC
Start: 2018-07-05 — End: 2018-07-05
  Filled 2018-07-05: qty 2

## 2018-07-05 MED ORDER — PHENYLEPHRINE 0.5 MG/5 ML (100 MCG/ML)IN 0.9 % SOD.CHLORIDE IV SYRINGE
INJECTION | Freq: Once | INTRAVENOUS | Status: DC | PRN
Start: 2018-07-05 — End: 2018-07-05
  Administered 2018-07-05 (×5): 100 ug via INTRAVENOUS

## 2018-07-05 MED ORDER — SODIUM CHLORIDE 0.9 % INTRAVENOUS SOLUTION
INTRAVENOUS | Status: DC | PRN
Start: 2018-07-05 — End: 2018-07-05

## 2018-07-05 MED ORDER — BACITRACIN 500 UNIT/G OINTMENT TUBE
TOPICAL_OINTMENT | Freq: Once | CUTANEOUS | Status: DC | PRN
Start: 2018-07-05 — End: 2018-07-06

## 2018-07-05 MED ORDER — MICROFIBRILLAR COLLAGEN HEMOSTAT POWDER
CUTANEOUS | Status: AC
Start: 2018-07-05 — End: 2018-07-05
  Filled 2018-07-05: qty 2

## 2018-07-05 MED ORDER — SODIUM CHLORIDE 0.9 % (FLUSH) INJECTION SYRINGE
2.0000 mL | INJECTION | Freq: Three times a day (TID) | INTRAMUSCULAR | Status: DC
Start: 2018-07-05 — End: 2018-07-30
  Administered 2018-07-05: 0 mL
  Administered 2018-07-06 (×2): 2 mL
  Administered 2018-07-06: 0 mL
  Administered 2018-07-07: 2 mL
  Administered 2018-07-07: 0 mL
  Administered 2018-07-08 (×2): 2 mL
  Administered 2018-07-08: 0 mL
  Administered 2018-07-09 – 2018-07-11 (×9): 2 mL
  Administered 2018-07-11 – 2018-07-12 (×2): 0 mL
  Administered 2018-07-12: 2 mL
  Administered 2018-07-12: 0 mL
  Administered 2018-07-13: 2 mL
  Administered 2018-07-13: 0 mL
  Administered 2018-07-13 – 2018-07-14 (×2): 2 mL
  Administered 2018-07-14: 0 mL
  Administered 2018-07-14 – 2018-07-15 (×2): 2 mL
  Administered 2018-07-15: 0 mL
  Administered 2018-07-15 – 2018-07-16 (×3): 2 mL
  Administered 2018-07-16 (×2): 0 mL
  Administered 2018-07-17 – 2018-07-19 (×7): 2 mL
  Administered 2018-07-19: 0 mL
  Administered 2018-07-19 – 2018-07-22 (×8): 2 mL
  Administered 2018-07-22: 0 mL
  Administered 2018-07-22 – 2018-07-23 (×3): 2 mL
  Administered 2018-07-23: 0 mL
  Administered 2018-07-24 – 2018-07-26 (×8): 2 mL
  Administered 2018-07-26: 0 mL
  Administered 2018-07-27 – 2018-07-30 (×10): 2 mL

## 2018-07-05 MED ORDER — SURGIFOAM SIZE 100 CM SPONGE
VAGINAL_SPONGE | CUTANEOUS | Status: AC
Start: 2018-07-05 — End: 2018-07-05
  Filled 2018-07-05: qty 1

## 2018-07-05 MED ORDER — THROMBIN (RECOMBINANT) 5,000 UNIT TOPICAL SOLUTION
CUTANEOUS | Status: AC
Start: 2018-07-05 — End: 2018-07-05
  Filled 2018-07-05: qty 4

## 2018-07-05 MED ORDER — SODIUM CHLORIDE 0.9 % IRRIGATION SOLUTION
50000.0000 [IU] | Status: AC | PRN
Start: 2018-07-05 — End: 2018-07-06
  Administered 2018-07-05 – 2018-07-06 (×2): 50000 [IU]

## 2018-07-05 MED ORDER — ALBUMIN, HUMAN 5 % INTRAVENOUS SOLUTION
INTRAVENOUS | Status: DC | PRN
Start: 2018-07-05 — End: 2018-07-05

## 2018-07-05 MED ORDER — MIDAZOLAM 1 MG/ML INJECTION SOLUTION
Freq: Once | INTRAMUSCULAR | Status: DC | PRN
Start: 2018-07-05 — End: 2018-07-05
  Administered 2018-07-05: 2 mg via INTRAVENOUS

## 2018-07-05 MED ORDER — LIDOCAINE 1 %-EPINEPHRINE 1:100,000 INJECTION SOLUTION
15.0000 mL | Freq: Once | INTRAMUSCULAR | Status: DC | PRN
Start: 2018-07-05 — End: 2018-07-06
  Administered 2018-07-05: 10 mL via INTRAMUSCULAR

## 2018-07-05 MED ORDER — CALCIUM CHLORIDE 100 MG/ML (10 %) INTRAVENOUS SYRINGE
INJECTION | Freq: Once | INTRAVENOUS | Status: DC | PRN
Start: 2018-07-05 — End: 2018-07-06
  Administered 2018-07-05: 250 mg via INTRAVENOUS

## 2018-07-05 MED ORDER — PROPOFOL 10 MG/ML INTRAVENOUS EMULSION
INTRAVENOUS | Status: DC | PRN
Start: 2018-07-05 — End: 2018-07-06
  Administered 2018-07-05: 25 ug/kg/min via INTRAVENOUS
  Administered 2018-07-06: 0 ug/kg/min via INTRAVENOUS

## 2018-07-05 MED ORDER — SODIUM CHLORIDE 0.9 % IRRIGATION SOLUTION
1000.0000 mL | Status: AC | PRN
Start: 2018-07-05 — End: 2018-07-06
  Administered 2018-07-05 – 2018-07-06 (×3): 1000 mL

## 2018-07-05 MED ORDER — SODIUM CHLORIDE 0.9 % INTRAVENOUS SOLUTION
INTRAVENOUS | Status: DC
Start: 2018-07-05 — End: 2018-07-06

## 2018-07-05 MED ORDER — PHENYLEPHRINE 50 MG/250 ML (200 MCG/ML) IN 0.9 % SODIUM CHLORIDE IV
INTRAVENOUS | Status: DC | PRN
Start: 2018-07-05 — End: 2018-07-05
  Administered 2018-07-05: 0.3 ug/kg/min via INTRAVENOUS
  Administered 2018-07-05: .4 ug/kg/min via INTRAVENOUS
  Administered 2018-07-05 (×2): 0.3 ug/kg/min via INTRAVENOUS
  Administered 2018-07-05 (×2): 0.5 ug/kg/min via INTRAVENOUS
  Administered 2018-07-05: 0.3 ug/kg/min via INTRAVENOUS
  Administered 2018-07-05: .4 ug/kg/min via INTRAVENOUS
  Administered 2018-07-05: .1 ug/kg/min via INTRAVENOUS
  Administered 2018-07-05: 0.3 ug/kg/min via INTRAVENOUS
  Administered 2018-07-05: 0 ug/kg/min via INTRAVENOUS
  Administered 2018-07-05 (×3): 0.5 ug/kg/min via INTRAVENOUS
  Administered 2018-07-05: .75 ug/kg/min via INTRAVENOUS
  Administered 2018-07-05: 0.2 ug/kg/min via INTRAVENOUS
  Administered 2018-07-05: 0.75 ug/kg/min via INTRAVENOUS
  Administered 2018-07-05: .4 ug/kg/min via INTRAVENOUS
  Administered 2018-07-05: 0.2 ug/kg/min via INTRAVENOUS
  Administered 2018-07-05 (×2): 0.5 ug/kg/min via INTRAVENOUS
  Administered 2018-07-05 (×4): 0.3 ug/kg/min via INTRAVENOUS
  Administered 2018-07-05: .4 ug/kg/min via INTRAVENOUS
  Administered 2018-07-05: 0.2 ug/kg/min via INTRAVENOUS
  Administered 2018-07-05 (×4): .4 ug/kg/min via INTRAVENOUS
  Administered 2018-07-05: 0.6 ug/kg/min via INTRAVENOUS
  Administered 2018-07-05: .4 ug/kg/min via INTRAVENOUS
  Administered 2018-07-05: 0.3 ug/kg/min via INTRAVENOUS
  Administered 2018-07-05: 0.5 ug/kg/min via INTRAVENOUS
  Administered 2018-07-05: 0.2 ug/kg/min via INTRAVENOUS
  Administered 2018-07-05: 0.3 ug/kg/min via INTRAVENOUS
  Administered 2018-07-05: 0.5 ug/kg/min via INTRAVENOUS
  Administered 2018-07-05: .45 ug/kg/min via INTRAVENOUS
  Administered 2018-07-05: 0.6 ug/kg/min via INTRAVENOUS
  Administered 2018-07-05: 0.3 ug/kg/min via INTRAVENOUS
  Administered 2018-07-05: .45 ug/kg/min via INTRAVENOUS

## 2018-07-05 MED ORDER — NITROGLYCERIN 100 MCG/ML IN D5W INJECTION
INJECTION | Freq: Once | INTRAVENOUS | Status: DC | PRN
Start: 2018-07-05 — End: 2018-07-05
  Administered 2018-07-05 (×2): 50 ug via INTRAVENOUS

## 2018-07-05 MED ORDER — FENTANYL (PF) 50 MCG/ML INJECTION SOLUTION
Freq: Once | INTRAMUSCULAR | Status: DC | PRN
Start: 2018-07-05 — End: 2018-07-05
  Administered 2018-07-05: 100 ug via INTRAVENOUS
  Administered 2018-07-05: 50 ug via INTRAVENOUS

## 2018-07-05 MED ORDER — GELATIN MATRIX SEALANT (FLOSEAL) 10 ML KIT
PACK | Freq: Once | CUTANEOUS | Status: DC | PRN
Start: 2018-07-05 — End: 2018-07-06
  Administered 2018-07-05 – 2018-07-06 (×2): 1 via TOPICAL

## 2018-07-05 MED ORDER — SURGIFOAM SIZE 100 CM SPONGE
VAGINAL_SPONGE | CUTANEOUS | Status: AC
Start: 2018-07-05 — End: 2018-07-05
  Filled 2018-07-05: qty 2

## 2018-07-05 MED ORDER — LIDOCAINE (PF) 100 MG/5 ML (2 %) INTRAVENOUS SYRINGE
INJECTION | Freq: Once | INTRAVENOUS | Status: DC | PRN
Start: 2018-07-05 — End: 2018-07-05
  Administered 2018-07-05: 80 mg via INTRAVENOUS

## 2018-07-05 MED ORDER — LIDOCAINE-EPINEPHRINE 0.5 %-1:200,000 INJECTION SOLUTION
INTRAMUSCULAR | Status: AC
Start: 2018-07-05 — End: 2018-07-05
  Filled 2018-07-05: qty 50

## 2018-07-05 MED ORDER — THROMBIN (RECOMBINANT) 5,000 UNIT TOPICAL SOLUTION
CUTANEOUS | Status: AC
Start: 2018-07-05 — End: 2018-07-05
  Filled 2018-07-05: qty 1

## 2018-07-05 MED ORDER — SURGIFOAM SIZE 100 CM SPONGE
1.0000 | VAGINAL_SPONGE | Freq: Once | CUTANEOUS | Status: DC | PRN
Start: 2018-07-05 — End: 2018-07-06
  Administered 2018-07-05 – 2018-07-06 (×2): 1 via TOPICAL

## 2018-07-05 MED ORDER — PROPOFOL 10 MG/ML IV BOLUS
INJECTION | Freq: Once | INTRAVENOUS | Status: DC | PRN
Start: 2018-07-05 — End: 2018-07-05
  Administered 2018-07-05: 150 mg via INTRAVENOUS
  Administered 2018-07-05: 50 mg via INTRAVENOUS

## 2018-07-05 MED ORDER — KETAMINE 10 MG/ML INJECTION WRAPPER
Freq: Once | INTRAMUSCULAR | Status: DC | PRN
Start: 2018-07-05 — End: 2018-07-05
  Administered 2018-07-05: 30 mg via INTRAVENOUS

## 2018-07-05 MED ORDER — PROPOFOL 10 MG/ML INTRAVENOUS EMULSION
INTRAVENOUS | Status: DC | PRN
Start: 2018-07-05 — End: 2018-07-05
  Administered 2018-07-05: 40 ug/kg/min via INTRAVENOUS
  Administered 2018-07-05: 0 ug/kg/min via INTRAVENOUS
  Administered 2018-07-05: 10:00:00 50 ug/kg/min via INTRAVENOUS

## 2018-07-05 MED ORDER — GELATIN MATRIX SEALANT (FLOSEAL) 5 ML KIT
PACK | Freq: Once | CUTANEOUS | Status: DC | PRN
Start: 2018-07-05 — End: 2018-07-06
  Administered 2018-07-05: 1 via TOPICAL

## 2018-07-05 MED ORDER — PHENYLEPHRINE 50 MG/250 ML (200 MCG/ML) IN 0.9 % SODIUM CHLORIDE IV
INTRAVENOUS | Status: DC | PRN
Start: 2018-07-05 — End: 2018-07-06
  Administered 2018-07-05: 0.75 ug/kg/min via INTRAVENOUS
  Administered 2018-07-05: 0.5 ug/kg/min via INTRAVENOUS
  Administered 2018-07-05: 0.3 ug/kg/min via INTRAVENOUS
  Administered 2018-07-06: .9 ug/kg/min via INTRAVENOUS
  Administered 2018-07-06: 0.5 ug/kg/min via INTRAVENOUS
  Administered 2018-07-06: 0.3 ug/kg/min via INTRAVENOUS
  Administered 2018-07-06: .7 ug/kg/min via INTRAVENOUS
  Administered 2018-07-06 (×2): 0 ug/kg/min via INTRAVENOUS
  Administered 2018-07-06: 0.5 ug/kg/min via INTRAVENOUS

## 2018-07-05 MED ORDER — THROMBIN (RECOMBINANT) 5,000 UNIT TOPICAL SOLUTION
Freq: Once | INTRAMUSCULAR | Status: DC | PRN
Start: 2018-07-05 — End: 2018-07-06

## 2018-07-05 MED ORDER — MANNITOL 20 % IV INJECTION
Freq: Once | INTRAVENOUS | Status: DC | PRN
Start: 2018-07-05 — End: 2018-07-06
  Administered 2018-07-05: 35 g via INTRAVENOUS

## 2018-07-05 MED ORDER — ROCURONIUM 10 MG/ML INTRAVENOUS SOLUTION
Freq: Once | INTRAVENOUS | Status: DC | PRN
Start: 2018-07-05 — End: 2018-07-06
  Administered 2018-07-05: 50 mg via INTRAVENOUS
  Administered 2018-07-06: 15 mg via INTRAVENOUS

## 2018-07-05 MED ORDER — LIDOCAINE 1 %-EPINEPHRINE 1:100,000 INJECTION SOLUTION
INTRAMUSCULAR | Status: AC
Start: 2018-07-05 — End: 2018-07-05
  Filled 2018-07-05: qty 30

## 2018-07-05 MED ORDER — SUCCINYLCHOLINE(PF)200 MG/10 ML(20 MG/ML)-NACL,ISO INTRAVENOUS SYRINGE
INJECTION | Freq: Once | INTRAVENOUS | Status: DC | PRN
Start: 2018-07-05 — End: 2018-07-05
  Administered 2018-07-05: 100 mg via INTRAVENOUS

## 2018-07-05 MED ORDER — VANCOMYCIN 1,000 MG IV POWDER - FOR OR
1.0000 g | Freq: Once | TOPICAL | Status: DC | PRN
Start: 2018-07-05 — End: 2018-07-06

## 2018-07-05 MED ORDER — NAFCILLIN 2 GRAM/100 ML IN DEXTROSE(ISO-OSMOTIC) INTRAVENOUS PIGGYBACK
2.0000 g | INJECTION | Freq: Once | INTRAVENOUS | Status: AC
Start: 2018-07-05 — End: 2018-07-05
  Administered 2018-07-05 (×4): 2 g via INTRAVENOUS
  Filled 2018-07-05: qty 100

## 2018-07-05 MED ORDER — ACETAMINOPHEN 1,000 MG/100 ML (10 MG/ML) INTRAVENOUS SOLUTION
Freq: Once | INTRAVENOUS | Status: DC | PRN
Start: 2018-07-05 — End: 2018-07-05
  Administered 2018-07-05 (×2): 1000 mg via INTRAVENOUS

## 2018-07-05 MED ORDER — REMIFENTANIL 50 MCG/ML INFUSION - FOR ANES
INTRAVENOUS | Status: DC | PRN
Start: 2018-07-05 — End: 2018-07-06
  Administered 2018-07-05: .05 ug/kg/min via INTRAVENOUS
  Administered 2018-07-05: 0.1 ug/kg/min via INTRAVENOUS
  Administered 2018-07-06: 0 ug/kg/min via INTRAVENOUS

## 2018-07-05 MED ORDER — DEXAMETHASONE SODIUM PHOSPHATE 4 MG/ML INJECTION SOLUTION
Freq: Once | INTRAMUSCULAR | Status: DC | PRN
Start: 2018-07-05 — End: 2018-07-05
  Administered 2018-07-05: 4 mg via INTRAVENOUS

## 2018-07-05 MED ORDER — ROCURONIUM 10 MG/ML INTRAVENOUS SOLUTION
Freq: Once | INTRAVENOUS | Status: DC | PRN
Start: 2018-07-05 — End: 2018-07-05
  Administered 2018-07-05: 10 mg via INTRAVENOUS

## 2018-07-05 MED ADMIN — clindamycin phosphate 1 % topical solution: INTRAVENOUS | @ 15:00:00 | NDC 59762372801

## 2018-07-05 MED ADMIN — phenylephrine 0.5 mg/5 mL (100 mcg/mL)in 0.9 % sod.chloride IV syringe: INTRAVENOUS | @ 20:00:00

## 2018-07-05 MED ADMIN — sodium chloride 0.9 % intravenous solution: INTRAVENOUS | @ 14:00:00

## 2018-07-05 MED ADMIN — nitroglycerin 50 mg/10 mL (5 mg/mL) intravenous solution: INTRAVENOUS | @ 22:00:00

## 2018-07-05 MED ADMIN — SODIUM CHLORIDE 0.9 % W/ ADDITIVES: INTRAVENOUS | @ 13:00:00 | NDC 00264780010

## 2018-07-05 MED ADMIN — lactated Ringers intravenous solution: INTRAVENOUS | @ 10:00:00 | NDC 00338011704

## 2018-07-05 MED ADMIN — phenylephrine 50 mg/250 mL (200 mcg/mL) in 0.9 % sodium chloride IV: INTRAVENOUS | @ 18:00:00

## 2018-07-05 MED ADMIN — phenylephrine 50 mg/250 mL (200 mcg/mL) in 0.9 % sodium chloride IV: INTRAVENOUS | @ 12:00:00

## 2018-07-05 MED ADMIN — propofol 10 mg/mL intravenous emulsion: INTRAVENOUS | @ 11:00:00

## 2018-07-05 MED ADMIN — sodium chloride 0.9 % intravenous solution: INTRAVENOUS | @ 23:00:00

## 2018-07-05 MED ADMIN — metoprolol tartrate 50 mg tablet: INTRAVENOUS | @ 11:00:00

## 2018-07-05 MED ADMIN — lactated Ringers intravenous solution: INTRAVENOUS | @ 12:00:00 | NDC 00338011704

## 2018-07-05 MED ADMIN — sodium chloride 0.9 % (flush) injection syringe: INTRAVENOUS | @ 12:00:00

## 2018-07-05 MED ADMIN — sodium chloride 0.9 % intravenous solution: INTRAVENOUS | @ 11:00:00 | NDC 00338004904

## 2018-07-05 MED ADMIN — nicotine 21 mg/24 hr daily transdermal patch: INTRAVENOUS | @ 15:00:00

## 2018-07-05 MED ADMIN — albumin, human 5 % intravenous solution: INTRAVENOUS | NDC 00944049505

## 2018-07-05 MED ADMIN — sodium chloride 0.9 % (flush) injection syringe: INTRAVENOUS | @ 10:00:00

## 2018-07-05 MED ADMIN — succinylcholine chloride 200 mg/10 mL (20 mg/mL) intravenous syringe: INTRAVENOUS | @ 15:00:00

## 2018-07-05 MED ADMIN — lactated Ringers intravenous solution: INTRAVENOUS | @ 23:00:00 | NDC 00264775000

## 2018-07-05 MED ADMIN — nystatin 100,000 unit/gram topical powder: TOPICAL | @ 20:00:00 | NDC 00574200815

## 2018-07-05 MED ADMIN — sodium chloride 0.9 % (flush) injection syringe: INTRAVENOUS | @ 22:00:00

## 2018-07-05 MED ADMIN — ipratropium bromide 0.02 % solution for inhalation: @ 22:00:00

## 2018-07-05 SURGICAL SUPPLY — 64 items
BATTERY SCREWDRIVER 9V 6250113_6250113 QUIKDRIVE MINI (INSTRUMENTS) ×1
BATTERY SURG DRIVER SM POWER D EV CRDLS STERNUM SAW 4100 (BATTERIES/FLASHLIGHTS) ×3 IMPLANT
BIT DRILL 1.7MM ELT WRPS STRL LF  DISP (SURGICAL CUTTING SUPPLIES) ×2 IMPLANT
BIT DRILL 1.7MM WRPS STRL LF_DISP (CUTTING ELEMENTS) ×1
BLADE SAW 12IN SS GIGLI NONST LF (INSTRUMENTS)
BLADE SAW 12IN SS GIGLI NONST LF (SURGICAL INSTRUMENTS) IMPLANT
BLADE SAW 12IN SS GIGLI NONST_LF (INSTRUMENTS)
BLANKET ADULT 85.8X50IN FRC AIR WARM LF  WHT (MISCELLANEOUS PT CARE ITEMS) ×2 IMPLANT
BLANKET WARMER 85.8INW X 50INL ADLT FULL BODY MISTRAL-AIR (MISCELLANEOUS PT CARE ITEMS) ×1
BURR SURG 5MM TPS ELT XCOARSE RND DIAMOND 5 N XPS MRK (SURGICAL CUTTING SUPPLIES) IMPLANT
CAN SUCT 2000ML C2L IMPLOSION PRF SLF CNTN BUIL IN CAP HIFLO PATHOGEN FILTER DISP LTX TEAL (Suction) ×2 IMPLANT
CAN SUCT 2000ML C2L IMPLOSION_PRF SLF CNTN BUIL IN CAP HIFLO (Suction) ×2
CATH EXTERN DRAIN EDM 80CM CLO SE TIP IMPREGNATE LUMB BA SIL (Drains/Resovoirs)
CATH EXTERN DRAIN EDM 80CM CLS TIP BA IMPREGNATE LUMB SIL STRL (Drains/Resovoirs) IMPLANT
CEMENT BONE HYDROSET SUB VOID FIL CA PHOS 5ML INJ OSTCNDC STRL LF ×2 IMPLANT
COMB HAIR PLASTIC MINI FIRM FL XB TTH HNDL NRW SPC BLK ADLT (TOLT)
CONV USE 23866 - NEEDLE HYPO 27GA 1.5IN STD MONOJECT SS POLYPROP REG BVL LL HUB UL SHRP ANTICORE YW STRL LF  DISP (NEEDLES & SYRINGE SUPPLIES) ×2 IMPLANT
CONV USE ITEM 161877 - COMB HAIR PLASTIC MINI FIRM FL_XB TTH HNDL NRW SPC BLK ADLT (TOLT) IMPLANT
CONV USE ITEM 337890 - PACK SURG BSIN 2 STRL LF  DISP (CUSTOM TRAYS & PACK) ×2 IMPLANT
CONV USE ITEM 338689 - PACK SURG CRANI STRL DISP LF (TRAY) ×2 IMPLANT
COVER WAND RFD STRL 50EA/CS 01-0020 (EQUIPMENT MINOR) ×1
COVER WND RF DETECT STRL CLR EQP (EQUIPMENT MINOR) ×2 IMPLANT
DISCONTINUED NO SUB - BLADE SAW 12IN SS GIGLI NONST LF (SURGICAL INSTRUMENTS) IMPLANT
DRAIN ACCUDRAIN ANRFLX V SYSTE M CSF STRL LF DISP (Drains/Resovoirs)
DRAIN SUCT RND 10FR_SU130-1321 (WOUND CARE SUPPLY) ×2 IMPLANT
DRAIN SUCT RND 10FR_SU130-1321 (WOUND CARE/ENTEROSTOMAL SUPPLY) ×1
DRAPE MICSCP 118X48IN OPM STRL_DISP EQP (DRAPE/PACKS/SHEETS/OR TOWEL) ×1
DRAPE MICSCP ECON LEN 118X48IN OPM VISIONGUARD STRL EQP 65MM (DRAPE/PACKS/SHEETS/OR TOWEL) ×2 IMPLANT
DRESS 8X4IN PRMPR ACRL PLSTR HI ABS PAD NWVN SFT BRTHBL CVR WOUND STRL LF (WOUND CARE SUPPLY) ×2 IMPLANT
EXTERN DRAIN ACCUDRAIN CSF GRAD BRTT NDLS SAMPLE ST ANRFLX V SYSTEM STRL LF  DISP (Drains/Resovoirs) IMPLANT
GARMENT COMPRESS MED CALF CENTAURA NYL VASOGRAD LTWT BRTHBL SEQ FIL BLU 18- IN (ORTHOPEDICS (NOT IMPLANTS)) ×2 IMPLANT
GARMENT COMPRESS MED CALF CENT_AURA NYL VASOGRAD LTWT BRTHBL (ORTHOPEDICS (NOT IMPLANTS)) ×1
GOWN SURG XL L3 NONREINFORCE HKLP CLSR SET IN SLEEVE STRL LF  DISP BLU SIRUS SMS 47IN (DGOW) ×4 IMPLANT
GOWN SURG XL L3 NONREINFORCE H_KLP CLSR SET IN SLEEVE STRL LF (DGOW) ×2
HEMOSTAT ABS 8X4IN FLXB SHR WV SRGCL STRL DISP (WOUND CARE/ENTEROSTOMAL SUPPLY) ×1
HEMOSTAT ABS 8X4IN FLXB SHR WV_SRGCL STRL DISP (WOUND CARE SUPPLY) ×2 IMPLANT
NEEDLE HYPO 27GA 1.5IN STD MO NOJECT SS POLYPROP REG BVL LL (NEEDLES & SYRINGE SUPPLIES) ×1
PACK BTRY QUICKDRIVE (SURGICAL INSTRUMENTS) ×2 IMPLANT
PACK SURG CRANI STRL DISP LF (TRAY) ×2
PERFORATOR 11-14MM CRANIAL DGR-O SURG STRL DISP PED (SURGICAL INSTRUMENTS) IMPLANT
PERFORATOR 14-11MM CRNL DGR-O SURG STRL DISP PED (INSTRUMENTS)
PIN ADLT MAYFIELD WNG GRV PLAS_TIC CRNM SKULL DISP STRL (TRAC) ×1
PIN ADULT MAYFIELD WNG GRV PLASTIC CRNM SKULL DISP STRL (TRAC) ×2 IMPLANT
PLATE UNIV NEURO III 12MM 2 H LOW PROF BAR TAB TI CRNMXF DOG BONE NONST 1.5MM SCREW RETROSIGMOID ×6 IMPLANT
RESERVOIR DRAIN SIL JP BULB 100CC STRL LF  DISP (WOUND CARE SUPPLY) ×2 IMPLANT
RETRACTOR 17MM X 5CM_TC171105 (MISCELLANEOUS PT CARE ITEMS) ×1
RETRACTOR 17MM X 7CM TC171107 (MISCELLANEOUS PT CARE ITEMS) ×1
RETRACTR 5CMX17MMX11MM VIEWSITE INTROD WRK CHNL SURG BRN ACCESS SYS (MISCELLANEOUS PT CARE ITEMS) ×2 IMPLANT
RETRACTR 7CMX17MMX11MM VIEWSITE SURG BRN ACCESS SYS (MISCELLANEOUS PT CARE ITEMS) ×2 IMPLANT
ROUTER SURG BLU RD 16MM 2.3MM_SPRL (INSTRUMENTS)
ROUTER SURG BLU RD 2.3MM SPRL LF (SURGICAL INSTRUMENTS) IMPLANT
SCREW BONE 1.5MM 4MM SLF DRILL XPN CRNMXF STRL ×12 IMPLANT
SET CORD TUBE FLY LEAD ROT PUMP CDMN IRRG NONST LF  DISP (Connecting Tubes/Misc) ×2 IMPLANT
SET CORD TUBE FLY LEAD ROT PUM_P CDMN IRRG NONST LF DISP (Connecting Tubes/Misc) ×1
SET TUBING XTD STR OMNI DISP (Suction) ×2 IMPLANT
SPHERE NAVIGATE (NEUR) ×3 IMPLANT
TAPE ADH 3IN 1538-3 BX/4 ROLLS (MED/SURG TAPES) ×2 IMPLANT
TIP ASP 4.6IN 2.47MM 1.92MM 25KHZ STR LRG DIA SFT TISS US SONOPET UNIV HANDPC STRL LF  DISP (SURGICAL INSTRUMENTS) IMPLANT
TIP ASP 4.9IN 1.77MM 1.37MM 25KHZ MICRO STR SFT TISS US SONOPET UNIV HANDPC STRL LF  DISP (SURGICAL INSTRUMENTS) IMPLANT
TIP MICRO DIAMETER STR_5450800309 5EA/PK (INSTRUMENTS)
TRAY CRANIOTOMY CUSTOM_CS/1 (TRAY) ×1
TRAY STERILIZATION SPHERES (SURGICAL INSTRUMENTS) ×4 IMPLANT
TRAY STRL SPHERES (INSTRUMENTS) ×4
TUBING SUCT 5450850003 BX/5 (Suction) ×1

## 2018-07-05 SURGICAL SUPPLY — 50 items
BIT DRILL 1MM ELT WRPS STRL LF  DISP (SURGICAL CUTTING SUPPLIES) IMPLANT
BLADE SAW 12IN SS GIGLI NONST LF (INSTRUMENTS)
BLANKET ADULT 85.8X50IN FRC AIR WARM LF  WHT (MISCELLANEOUS PT CARE ITEMS) ×1 IMPLANT
BLANKET WARMER 85.8INW X 50INL_ADLT FULL BODY MISTRAL-AIR (MISCELLANEOUS PT CARE ITEMS) ×1
BURR SURG 5MM TPS ELT XCOARSE RND DIAMOND 5 N XPS MRK (SURGICAL CUTTING SUPPLIES) ×1 IMPLANT
CAN SUCT 2000ML C2L IMPLOSION PRF SLF CNTN BUIL IN CAP HIFLO PATHOGEN FILTER DISP LTX TEAL (Suction) IMPLANT
CASSETTE IRRG COR STRL LF  DISP (Connecting Tubes/Misc) IMPLANT
CASSETTE IRRG COR STRL LF DIS P (Connecting Tubes/Misc)
CATH EXTERN DRAIN EDM 80CM CLO_SE TIP IMPREGNATE LUMB BA SIL (Drains/Resovoirs)
CATH EXTERN DRAIN EDM 80CM CLS TIP BA IMPREGNATE LUMB SIL STRL (Drains/Resovoirs) IMPLANT
CATH IV 14GA 1.75IN SHIELD NTCH NEEDLE PSHBTN DEHP-FR BD VLN INST ATGRD PERI STD STRL LF  DISP ORNG (IV TUBING & ACCESSORIES) ×1 IMPLANT
CLIP IRRG 4CM ANG (IRR) IMPLANT
CONV USE ITEM 161877 - COMB HAIR PLASTIC MINI FIRM FL_XB TTH HNDL NRW SPC BLK ADLT (TOLT) IMPLANT
CONV USE ITEM 337890 - PACK SURG BSIN 2 STRL LF  DISP (CUSTOM TRAYS & PACK) ×1 IMPLANT
CONV USE ITEM 338689 - PACK SURG CRANI STRL DISP LF (TRAY) ×1 IMPLANT
COVER WAND RFD STRL 50EA/CS 01-0020 (EQUIPMENT MINOR) ×1
COVER WND RF DETECT STRL CLR EQP (EQUIPMENT MINOR) ×1 IMPLANT
DISCONTINUED NO SUB - BLADE SAW 12IN SS GIGLI NONST LF (SURGICAL INSTRUMENTS) IMPLANT
DRAIN ACCUDRAIN ANRFLX V SYSTE_M CSF STRL LF DISP (Drains/Resovoirs)
DRAIN SUCT RND 10FR_SU130-1321 (WOUND CARE SUPPLY) ×1 IMPLANT
DRAPE MICSCP ECON LEN 118X48IN OPM VISIONGUARD STRL EQP 65MM (DRAPE/PACKS/SHEETS/OR TOWEL) ×1 IMPLANT
DUPE USE ITEM 319434 - SUTURE 2-0 SH VICRYL 18IN VIOL_CR BRD 8 STRN COAT ABS (SUTURE/WOUND CLOSURE) IMPLANT
ELECTRODE PATIENT RTN 9FT VLAB C30- LB RM PHSV ACRL FOAM CORD NONIRRITATE NONSENSITIZE ADH STRP (CAUTERY SUPPLIES) ×1 IMPLANT
EXTERN DRAIN ACCUDRAIN CSF GRAD BRTT NDLS SAMPLE ST ANRFLX V SYSTEM STRL LF  DISP (Drains/Resovoirs) IMPLANT
GARMENT COMPRESS MED CALF CENTAURA NYL VASOGRAD LTWT BRTHBL SEQ FIL BLU 18- IN (ORTHOPEDICS (NOT IMPLANTS)) ×1 IMPLANT
GOWN SURG XL L3 NONREINFORCE H KLP CLSR SET IN SLEEVE STRL LF (DGOW) ×2
GOWN SURG XL L3 NONREINFORCE HKLP CLSR SET IN SLEEVE STRL LF  DISP BLU SIRUS SMS 47IN (DGOW) ×2 IMPLANT
KIT GUN RANEY CLIP DISP CG8901_PK/10 (KITS & TRAYS (DISPOSABLE)) ×1 IMPLANT
MARKER SURG SM SPHERZ PSV REFLC THREAD WRLS TRK DISP BRAINLAB IGS STRL (Cautery Accessories) ×5 IMPLANT
MARKER SURG SPHERZ PSV REFLC T HRD DISP STRL (Cautery Accessories) ×5
PACK BASIN DBL CUSTOM (CUSTOM TRAYS & PACK) ×1
PACK SURG CRANI STRL DISP LF (TRAY) ×1
PACK SURG TBG STRL DISP 30IN SIL LF (Connecting Tubes/Misc) ×2 IMPLANT
PATCH DURA 5X4IN CLGN MATRIX DURAMATRIX-ONLAY CNFRM RSRB CRNM DRPLS STRL (TISSUE/PREPARE) ×1 IMPLANT
PERFORATOR 11-14MM CRANIAL DGR-O SURG STRL DISP PED (SURGICAL INSTRUMENTS) IMPLANT
PIN ADLT MAYFIELD WNG GRV PLAS_TIC CRNM SKULL DISP STRL (TRAC) ×1
PIN ADULT MAYFIELD WNG GRV PLASTIC CRNM SKULL DISP STRL (TRAC) ×1 IMPLANT
RESERVOIR DRAIN SIL JP BULB 10 0CC STRL LF DISP (WOUND CARE/ENTEROSTOMAL SUPPLY) ×1
RESERVOIR DRAIN SIL JP BULB 100CC STRL LF  DISP (WOUND CARE SUPPLY) ×1 IMPLANT
ROUTER SURG BLU RD 16MM 2.3MM_SPRL (INSTRUMENTS)
ROUTER SURG BLU RD 2.3MM SPRL LF (SURGICAL INSTRUMENTS) IMPLANT
SET CORD TUBE FLY LEAD ROT PUMP CDMN IRRG NONST LF  DISP (Connecting Tubes/Misc) IMPLANT
SET TUBING XTD STR OMNI DISP (Suction) IMPLANT
SUTURE 2-0 SH VICRYL 18IN VIOL_CR BRD 8 STRN COAT ABS (SUTURE/WOUND CLOSURE)
SUTURE 4-0 TF NUROLON 18IN BLK BRD 8 STRN UNDIR NONAB (SUTURE/WOUND CLOSURE) IMPLANT
SUTURE 4-0 TF NUROLON 18IN BLK_BRD 8 STRN NONAB (SUTURE/WOUND CLOSURE)
SUTURE 4-0 TF TAPERPOINT NUROLON 18IN BLK CR BRD 8 STRN NONAB (SUTURE/WOUND CLOSURE) IMPLANT
SUTURE NYL 4-0 TF TAPERPOINT N UROLON 18IN BLK BRD CR NONAB (SUTURE/WOUND CLOSURE)
TAPE ADH 3IN 1538-3 BX/4 ROLLS (MED/SURG TAPES) IMPLANT
TUBING SILICONE 30IN (Connecting Tubes/Misc) ×2

## 2018-07-05 NOTE — Anesthesia Procedure Notes (Signed)
Arterial Line Procedure   Date/Time: 07/05/2018 10:28 AM   Pt location: In OR  Consent:     Consent given by:  Patient    Risks discussed:  Bleeding and pain  Universal protocol:     Procedure explained and questions answered to patient or proxy's satisfaction: yes      Immediately prior to procedure a time out was called: yes      Patient identity confirmed:  Verbally with patient, arm band and hospital-assigned identification number  Pre-procedure details:     Preparation: Preprocedure hand washing was performed; sterile field was maintained     Skin Prep used: Chlorhexidine gluconate  Anesthesia (see MAR for exact dosages):     Anesthesia method:  Under general anesthesia  A 20 G Catheter type: Arrow 1 and 3/4 inch in length,  Placed on the left  radial artery  using anatomical landmarks, palpation and modified Seldinger With  number of attempts:1.Secured with: transparent dressing   MEDICATIONS:     Post-procedure details:    Patient tolerance of procedure:  Tolerated well, no immediate complications blood withdrawn easily, flushes easily, good waveform, Waveform appropriate, Correlates with cuff, Flush/aspirate well and Calibrated  Complications:none  Performed By:  Performing provider: Marya Amsler, CRNA Authorizing provider: Lynnell Grain, DO

## 2018-07-05 NOTE — OR Nursing (Signed)
Patient's daughter updated per Dr. Margart Sickles.

## 2018-07-05 NOTE — Anesthesia Preprocedure Evaluation (Signed)
ANESTHESIA PRE-OP EVALUATION  Planned Procedure: CRANIOTOMY (N/A )  Review of Systems                   Pulmonary     Cardiovascular    No peripheral edema,        GI/Hepatic/Renal        Endo/Other          Neuro/Psych/MS        Cancer                     Physical Assessment      Airway       Mallampati: I    TM distance: >3 FB    Neck ROM: full  Mouth Opening: good.      No endotracheal tube present  No Tracheostomy present    Dental       Dentition intact             Pulmonary    Breath sounds clear to auscultation       Cardiovascular    Rhythm: regular  Rate: Normal  (-) carotid bruit is not present, no peripheral edema and no murmur     Other findings            Plan  Planned anesthesia type: general        ASA 4 - emergent     Intravenous induction   Arterial line             Patient's NPO status is appropriate for Anesthesia.           Plan discussed with CRNA.    (Patient returned to OR STAT from ct scan postoperatively for intracranial hemmorhage.  )             EKG: Within last six months  06/08/2018   Sinus bradycardia  Left ventricular hypertrophy with repolarization abnormality  Abnormal ECG      TTE 03/14/2018  Conclusions:  1. Left ventricle is normal in size. There is concentric hypertrophy of the left ventricle. There is no  evidence of any wall motion abnormality. The left ventricular systolic function normal with estimated  ejection fraction 56%. There is mild primary mitral regurgitation. There is mild tricuspid regurgitation. All  the cardiac chambers are within normal limits        CT Chest 06/08/2018  Lungs/Pleura: The lungs are clear without focal consolidation. There is a  small perifissural nodule in the anterior portion of the right lower lobe.   The central airways are patent. No pleural effusion or pneumothorax is  present.      Labs         Consults: None    Patient instructed to take the following medications day of surgery, metoprolol, Zofran if need.  Hold quinapril on day of  surgery.  Patient instructed to avoid NSAIDs, vitamins, supplements, and fish oil 7 days prior to surgery.   Patient provided anesthesia consent in PEC. Instructed to review consent prior to OR date. Educated that consent will be signed morning of surgery with anesthesiologist

## 2018-07-05 NOTE — Anesthesia Transfer of Care (Signed)
ANESTHESIA TRANSFER OF CARE   [name] is a 57 y.o. ,female, Weight: 78.2 kg (172 lb 6.4 oz)   had Procedure(s) with comments:  CRANIOTOMY FOR TUMOR WITH NAVIGATION  CT SCAN - @ 8:30  performed  07/05/18   Primary Service: [name], MD    Past Medical History:   Diagnosis Date   . Anxiety    . Arthropathy    . Cancer (CMS HCC)     brain tumor   . Dyspnea on exertion    . Esophageal reflux     does not take meds   . Heart murmur     benign   . HTN (hypertension)    . Hyperlipemia    . Muscle weakness     right sided weakness   . Palpitations    . Shortness of breath    . Wears glasses     reading      Allergy History as of 07/05/18      No Known Allergies              I completed my transfer of care / handoff to the receiving personnel during which we discussed:  Access, All key/critical aspects of case discussed, Antibiotics, Fluids/Product, Labs, Airway, Analgesia, Expectation of post procedure, Gave opportunity for questions and acknowledgement of understanding and PMHx  Recommendations: Neuro checks needed  Patient location: CT scanner.                                          Additional Info:Report given to NCCU RN in CT scan beside patient, pt on resp vent, VS Stable                      Last OR Temp: Temperature: 37.4 C (99.3 F)(temp in OR just prior to transfer to CT scanner)  ABG:  PH (ARTERIAL)   Date Value Ref Range Status   07/05/2018 7.33 (L) 7.35 - 7.45 Final     PH (T)   Date Value Ref Range Status   07/05/2018 7.33 (L) 7.35 - 7.45 Final     PCO2 (ARTERIAL)   Date Value Ref Range Status   07/05/2018 39.0 35.0 - 45.0 mm/Hg Final     PO2 (ARTERIAL)   Date Value Ref Range Status   07/05/2018 181.0 (H) 72.0 - 100.0 mm/Hg Final     SODIUM   Date Value Ref Range Status   07/05/2018 141 137 - 145 mmol/L Final     POTASSIUM   Date Value Ref Range Status   06/08/2018 3.9 3.5 - 5.1 mmol/L Final     KETONES   Date Value Ref Range Status   06/08/2018 Negative Negative mg/dL Final     WHOLE BLOOD  POTASSIUM   Date Value Ref Range Status   07/05/2018 3.8 3.5 - 4.6 mmol/L Final     CHLORIDE   Date Value Ref Range Status   07/05/2018 112 (H) 101 - 111 mmol/L Final     CALCIUM   Date Value Ref Range Status   06/08/2018 9.6 8.5 - 10.2 mg/dL Final     Calculated P Axis   Date Value Ref Range Status   06/08/2018 34 degrees Final     Calculated R Axis   Date Value Ref Range Status   06/08/2018 -20 degrees Final     Calculated T Axis  Date Value Ref Range Status   06/08/2018 120 degrees Final     IONIZED CALCIUM   Date Value Ref Range Status   07/05/2018 1.14 1.10 - 1.35 mmol/L Final     LACTATE   Date Value Ref Range Status   07/05/2018 3.3 (H) 0.0 - 1.3 mmol/L Final     HEMOGLOBIN   Date Value Ref Range Status   07/05/2018 12.1 12.0 - 18.0 g/dL Final     OXYHEMOGLOBIN   Date Value Ref Range Status   07/05/2018 96.2 85.0 - 98.0 % Final     CARBOXYHEMOGLOBIN   Date Value Ref Range Status   07/05/2018 2.4 0.0 - 2.5 % Final     MET-HEMOGLOBIN   Date Value Ref Range Status   07/05/2018 1.2 0.0 - 2.0 % Final     BASE DEFICIT   Date Value Ref Range Status   07/05/2018 4.9 (H) 0.0 - 3.0 mmol/L Final     BICARBONATE (ARTERIAL)   Date Value Ref Range Status   07/05/2018 21.1 18.0 - 26.0 mmol/L Final     TEMPERATURE, COMP   Date Value Ref Range Status   07/05/2018 37.0 15.0 - 40.0 C Final     Airway:  EndoTracheal Tube Oral;Cuffed 7.5 Lip (Active)   Airway Secure Tape 07/05/2018 12:00 AM     Blood pressure (!) 123/59, pulse 77, temperature 37.4 C (99.3 F), resp. rate 19, height 1.676 m (_0 ), weight 78.2 kg (172 lb 6.4 oz), SpO2 99 %.

## 2018-07-05 NOTE — H&P (Addendum)
[facility]      H&P UPDATE FORM                                                                                  [name] [name], 57 y.o. female  Date of Admission:  07/05/2018  Date of Birth:  1961/04/30    07/05/2018    STOP: IF H&P IS GREATER THAN 30 DAYS FROM SURGICAL DAY COMPLETE NEW H&P IS REQUIRED.     H & P updated the day of the procedure.  1.  H&P completed within 30 days of surgical procedure and has been reviewed within 24 hours of admission but prior to surgery or a procedure requiring anesthesia services, the patient has been examined, and no change has occured in the patients condition since the H&P was completed.       Change in medications: No        No LMP recorded. Patient is postmenopausal.      Comments:     2.  Patient continues to be appropriate candidate for planned surgical procedure. YES    Keane Police, MD     ADDENDUM  H&P from 06/08/18, signed by Dr. Elmon Else, M.D.  PGY-2 Neurosurgery  07/23/2018, 05:49

## 2018-07-05 NOTE — OR Nursing (Signed)
Updated family.

## 2018-07-05 NOTE — H&P (Signed)
[facility]                              Neurocritical Care [facility]) ADMISSION       HISTORY and PHYSICAL                    [name], [name], 57 y.o. female  Date of Admission:  07/05/2018  Date of Birth:  04/15/1961    PCP: [name] Caddy, MD  Consult Requested By: Neurosurgery    Information Obtained from: health care provider and history reviewed via medical record  Chief Complaint:  R sided weakness/numbess    HPI:  [name] is a 57 y.o. female who has a 2-3 year history of gradual progressive R upper and lower extremities weakness who recently seen Dr. Jimmey [name] and was recently admitted for worsening of R sided weakness and numbness. 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 Pt to undergo craniotomy for tumor resection with Dr. Margart [name] on 07/05/18. Pt to be admitted to NCCU under [place] service post operatively.     Past Medical History:   Diagnosis Date   . Anxiety    . Arthropathy    . Cancer (CMS HCC)     brain tumor   . Dyspnea on exertion    . Esophageal reflux     does not take meds   . Heart murmur     benign   . HTN (hypertension)    . Hyperlipemia    . Muscle weakness     right sided weakness   . Palpitations    . Shortness of breath    . Wears glasses     reading         Past Surgical History:   Procedure Laterality Date   . HX APPENDECTOMY     . HX BREAST AUGMENTATION Bilateral    . HX LAP CHOLECYSTECTOMY     . HX LUMBAR DISKECTOMY  1999   . HX OTHER Right     benign tumor removed from upper right leg x3   . HX OTHER      exploratory lap   . HX TUBAL LIGATION           Medications Prior to Admission     Prescriptions    acetaminophen (TYLENOL) 325 mg Oral Tablet    Take 2 Tabs (650 mg total) by mouth Every 6 hours as needed for Pain    diphenoxylate-atropine (LOMOTIL) 2.5-0.025 mg Oral Tablet    Once per day as needed    metoprolol tartrate  (LOPRESSOR) 25 mg Oral Tablet    Take 25 mg by mouth Twice daily    ondansetron (ZOFRAN) 8 mg Oral Tablet    Take 8 mg by mouth Twice per day as needed    quinapril (ACCUPRIL) 40 mg Oral Tablet    Take 40 mg by mouth Once a day        NS flush syringe, 2 mL, Intracatheter, Q8HRS    And  NS flush syringe, 2-6 mL, Intracatheter, Q1 MIN PRN  NS premix infusion, , Intravenous, Continuous      No Known Allergies  Social History     Tobacco Use   . Smoking status: Current Every Day Smoker     Packs/day: 1.00  Years: 30.00     Pack years: 30.00     Types: Cigarettes   . Smokeless tobacco: Never Used   . Tobacco comment: down to 8 cigarettes, counseled on 1-800-QUIT_NOW   Substance Use Topics   . Alcohol use: Never     Frequency: Never     Family Medical History:     Problem Relation (Age of Onset)    Ehlers-Danlos syndrome Daughter    Heart Attack Mother    Lymphoma Mother               ROS: Unable to assess, patient intubated, sedated    EXAM:  Temperature: 35.9 C (96.6 F)  Heart Rate: 63  BP (Non-Invasive): (!) 123/59  Respiratory Rate: 14  SpO2: 96 %  Pain Score (Numeric, Faces): 5  General: acutely ill  HENT:Head atraumatic and normocephalic, ENT without erythema or injection, mucous membranes moist.  Neck: No JVD or thyromegaly or lymphadenopathy  Lungs: Clear to auscultation bilaterally. , Clear to auscultation and percussion bilaterally.   Cardiovascular: regular rate and rhythm, S1, S2 normal, no murmur, click, rub or gallop  Abdomen: Soft, non-tender, Bowel sounds normal, Non-tender, non-distended  Extremities: No cyanosis or edema, extremities normal, atraumatic, no cyanosis or edema  Glasgow: Eye opening: 1 no response, Verbal resonse:  T Intubated, Best motor response:  1 no response  GCS Total Score: 3T- remains paralyzed/sedated from OR  Mental status:  Level of Consciousness: unresponsive to painful stimuli  Orientations: Disoriented to time, Disoriented to place and Disoriented to person  Memory: Does  not follow commands  AttentionsAttention decreased and Concentration decreased  Knowledge: Poor  Language/Speech: Unable to assess- intubated, sedated  Cranial nerves:   CN2: Visual acuity and fields intact  CN 3,4,6: PERRLA  CN 5 UTA  CN 7Face symmetrical  CN 8: Hearing grossly intact  CN 9,10:  gag normal  CN 11: UTA  CN 12: UTA  Gait, Coordination, and Reflexes:   Gait: unable to assess. Reason: intubated, sedated  Coordination: Coordination is normal without tremor  Reflexes: Reflexes are 2/2 throughout  Muscle tone: WNL  Muscle strength: No response post-op, remains paralyzed from OR  Reflexes: 2+ throughout  Sensory: Unable to assess, intubated, sedated  Normal color, texture and turgor without significant lesions or rashes  Diabetes Monitors:   FOOTEXAM: Both feet without edema or ulcerations. Pulses normal bilaterally. Sensation normal bilaterally    Ophthalmoscope: unable to assess non dialted pupils. Grossly intact    Labs:    I have reviewed all lab results.    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  no  Hospice Consulted?  Not applicable    Current Comorbid Conditions - Neurology H&P  Brain Compression:  no  Obstructive Hydrocephalus:  no  Coma (GCS less than 8): Coma -Not applicable  Stroke: Not applicable  Cerebral Edema:  no  Encephalopathy:  no  Encephalitis-Not applicable  Seizure-Not applicable   Acute Respiratory failure unspecified,  Vent on admission 07/06/18(date)  Coagulopathy Not applicable        Assessment/ Plan:   There are  no active  hospital problems to display for this patient.      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 POD0  -Admit to NCCU under neurosurgery service  [facility] neuro checks as patient is at risk for acute neurological decline  -Post operative CT brain ordered for 0400   -Seizure prophylaxis with Keppra 500 mg BID   -Lumbar drain- clamped per primary  -JP to bulb suction  -STAT CT brain without contrast for any acute change in neurological status     Cardiovascular:    HTN, chronic  -Maintain SBP < 140  -Labetalol/hydralazine PRN SBP > 140  -Continue metoprolol 25 mg BID  -Home quinapril 40 mg QD, not on formulary     Respiratory:    Acute respiratory failure requiring mechanical ventilation  Ventilator Settings:  Set PEEP: 5 cmH2O  Pressure Support: 8 cmH2O  FiO2: 40 %  -Wean vent as able  -Maintain SpO2 > 94%  -CXR, ABG ordered    Renal:  No acute issues.   -Monitor BMP for electrolyte abnormalities    Infectious Disease::  No acute issues  Post-operative prophylaxis  -Continue Nafcillin     Hematology:  No acute issues  -Daily CBC    DVT prophylaxis  -SCD's     Gastrointestinal:    At risk for constipation  -Senna/colace PRN     Endocrine:  No acute issues.   -Sliding scale insulin per protocol.      Psychiatric:  No acute issues.   -Monitor for ICU delirium     Prophylaxis:    DVT/PE Prophylaxis: SCDs/ Venodynes/Impulse boots  GI:  Not indicated   Seizure: Keppra     Family:  None present at bedside  Disposition/ Baseline:  ICU  Advance Directives:  None-Discussed  Hospice involvement prior to admission?  no        Theodora Blow, APRN,NP-C  07/05/2018, 14:21      UPDATE:  -Patient arrived to NCCU, remains intubated, paralyzed and sedated per OR.   -Patient initially out of OR at 2230, Pupils noted to be unequal. Stat CT Brain obtained and showed hemorrhage inside tumor resection bed. Patient taken back to OR for evacuation.  -Lumbar drain intact, keep clamped per primary  -Plan updated above.         Deretha Emory, APRN,FNP-BC  07/06/2018, 01:59        Attending Note Findings and Recommendations:    Patient localizes to noxious stimuli bilaterally  Not following  Eyes closed  ETT in place  Flap soft  Family at bedside and updated    Otherwise Plan as Above in Resident or Midlevel's Note.     Critical Care Attestation   I have reviewed the resident/ midlevel's note.  I agree with their findings and/or have made additions/ edits as well as my findings above.    I was present at the bedside of this critically ill patient exclusive of procedures that are documented elsewhere.  My services were independent and non-duplicative of other practioners of other specialties (non-Neurocritical Care).    This patient suffers from failure or dysfunction of  Neurological system(s).     The care of this patient was in regard to managing (a) conditions(s) that has a high probability of sudden, clinically significant, or life-threatening deterioration and required a high degree of Attending Physician attention and direct involvement to intervene urgently. Data review and care planning was performed in direct proximity of the patient, examination was obviously  performed in direct contact with the patient. All of this time was exclusive of procedure which will be documented elsewhere in the chart.   My critical care time involved full attention to the patient's condition and included:   Review of nursing notes and/or old charts   Review of medications, allergies, and vital signs   Documentation time   Consultant collaboration on findings and treatment options   Care, transfer of care, and discharge plans   Ordering, interpreting, and reviewing diagnostic studies/ lab tests   Obtaining necessary history from family, EMS, nursing home staff and/or treating physicians     My critical care time did not include time spent teaching resident physician(s) or other services of resident physicians, or performing other reported procedures.      Total Critical Care Time: 35 minutes    Valora Corporal, MD 07/06/2018, 14:50

## 2018-07-06 ENCOUNTER — Inpatient Hospital Stay (HOSPITAL_COMMUNITY): Payer: PPO

## 2018-07-06 ENCOUNTER — Inpatient Hospital Stay (HOSPITAL_COMMUNITY)

## 2018-07-06 DIAGNOSIS — D329 Benign neoplasm of meninges, unspecified: Secondary | ICD-10-CM

## 2018-07-06 DIAGNOSIS — Z4682 Encounter for fitting and adjustment of non-vascular catheter: Secondary | ICD-10-CM

## 2018-07-06 DIAGNOSIS — I10 Essential (primary) hypertension: Secondary | ICD-10-CM

## 2018-07-06 DIAGNOSIS — J9600 Acute respiratory failure, unspecified whether with hypoxia or hypercapnia: Secondary | ICD-10-CM

## 2018-07-06 DIAGNOSIS — D320 Benign neoplasm of cerebral meninges: Secondary | ICD-10-CM

## 2018-07-06 DIAGNOSIS — Z9889 Other specified postprocedural states: Secondary | ICD-10-CM

## 2018-07-06 LAB — PRODUCT: PLATELETS - UNITS
UNIT DIVISION: 0
UNIT DIVISION: 0

## 2018-07-06 LAB — CBC
HCT: 34.5 % — ABNORMAL LOW (ref 34.8–46.0)
HCT: 34.5 % — ABNORMAL LOW (ref 34.8–46.0)
HGB: 11.5 g/dL (ref 11.5–16.0)
MCH: 29.5 pg (ref 26.0–32.0)
MCHC: 33.3 g/dL (ref 31.0–35.5)
MCV: 88.5 fL (ref 78.0–100.0)
MPV: 9.8 fL (ref 8.7–12.5)
PLATELETS: 256 10*3/uL (ref 150–400)
RBC: 3.9 10*6/uL (ref 3.85–5.22)
RBC: 3.9 x10?6/uL (ref 3.85–5.22)
RDW-CV: 14.5 % (ref 11.5–15.5)
WBC: 19.4 10*3/uL — ABNORMAL HIGH (ref 3.7–11.0)

## 2018-07-06 LAB — BASIC METABOLIC PANEL
ANION GAP: 13 mmol/L (ref 4–13)
BUN/CREA RATIO: 16 (ref 6–22)
BUN: 16 mg/dL (ref 8–25)
BUN: 16 mg/dL (ref 8–25)
CALCIUM: 8.2 mg/dL — ABNORMAL LOW (ref 8.5–10.2)
CHLORIDE: 115 mmol/L — ABNORMAL HIGH (ref 96–111)
CO2 TOTAL: 16 mmol/L — ABNORMAL LOW (ref 22–32)
CREATININE: 0.97 mg/dL (ref 0.49–1.10)
ESTIMATED GFR: >60 mL/min/{1.73_m2} (ref >60)
GLUCOSE: 152 mg/dL — ABNORMAL HIGH (ref 65–139)
POTASSIUM: 4.1 mmol/L (ref 3.5–5.1)
SODIUM: 144 mmol/L (ref 136–145)

## 2018-07-06 LAB — POC BLOOD GLUCOSE (RESULTS)
GLUCOSE, POC: 161 mg/dL — ABNORMAL HIGH (ref 70–105)
GLUCOSE, POC: 167 mg/dL — ABNORMAL HIGH (ref 70–105)
GLUCOSE, POC: 152 mg/dL — ABNORMAL HIGH (ref 70–105)
GLUCOSE, POC: 146 mg/dL — ABNORMAL HIGH (ref 70–105)
GLUCOSE, POC: 153 mg/dL — ABNORMAL HIGH (ref 70–105)

## 2018-07-06 LAB — PHOSPHORUS: PHOSPHORUS: 4.9 mg/dL — ABNORMAL HIGH (ref 2.4–4.7)

## 2018-07-06 LAB — MAGNESIUM: MAGNESIUM: 1.9 mg/dL (ref 1.6–2.6)

## 2018-07-06 MED ORDER — ACETAMINOPHEN 325 MG TABLET
650.0000 mg | ORAL_TABLET | ORAL | Status: DC | PRN
Start: 2018-07-06 — End: 2018-07-06

## 2018-07-06 MED ORDER — GADOBUTROL 10 MMOL/10 ML (1 MMOL/ML) INTRAVENOUS SOLUTION
9.00 mL | INTRAVENOUS | Status: AC
Start: 2018-07-06 — End: 2018-07-06
  Administered 2018-07-06 (×2): 9 mL via INTRAVENOUS

## 2018-07-06 MED ORDER — HYDRALAZINE 20 MG/ML INJECTION SOLUTION
10.0000 mg | INTRAMUSCULAR | Status: DC | PRN
Start: 2018-07-06 — End: 2018-07-06

## 2018-07-06 MED ORDER — FENTANYL (PF) 50 MCG/ML INJECTION SOLUTION
25.00 ug | INTRAMUSCULAR | Status: DC | PRN
Start: 2018-07-06 — End: 2018-07-24
  Administered 2018-07-06 – 2018-07-12 (×11): 25 ug via INTRAVENOUS
  Filled 2018-07-06 (×12): qty 2

## 2018-07-06 MED ORDER — LABETALOL 20 MG/4 ML (5 MG/ML) INTRAVENOUS SYRINGE
10.0000 mg | INJECTION | INTRAVENOUS | Status: DC | PRN
Start: 2018-07-06 — End: 2018-07-11
  Administered 2018-07-06 – 2018-07-10 (×6): 10 mg via INTRAVENOUS
  Filled 2018-07-06 (×5): qty 4

## 2018-07-06 MED ORDER — NAFCILLIN 2 GRAM/100 ML IN DEXTROSE(ISO-OSMOTIC) INTRAVENOUS PIGGYBACK
2.0000 g | INJECTION | INTRAVENOUS | Status: AC
Start: 2018-07-06 — End: 2018-07-07
  Administered 2018-07-06 (×2): 2 g via INTRAVENOUS
  Administered 2018-07-06 (×3): 0 g via INTRAVENOUS
  Administered 2018-07-06: 2 g via INTRAVENOUS
  Administered 2018-07-06 (×2): 0 g via INTRAVENOUS
  Administered 2018-07-06 – 2018-07-07 (×3): 2 g via INTRAVENOUS
  Administered 2018-07-07: 0 g via INTRAVENOUS
  Filled 2018-07-06 (×6): qty 100

## 2018-07-06 MED ORDER — LEVETIRACETAM 500 MG/5 ML INTRAVENOUS SOLUTION
500.0000 mg | Freq: Two times a day (BID) | INTRAVENOUS | Status: DC
Start: 2018-07-06 — End: 2018-07-08
  Administered 2018-07-06 – 2018-07-08 (×6): 500 mg via INTRAVENOUS
  Filled 2018-07-06 (×5): qty 5

## 2018-07-06 MED ORDER — ELECTROLYTE-A INTRAVENOUS SOLUTION
INTRAVENOUS | Status: DC
Start: 2018-07-06 — End: 2018-07-07

## 2018-07-06 MED ORDER — SODIUM CHLORIDE 0.9 % IV BOLUS
1000.0000 mL | INJECTION | Freq: Once | Status: AC
Start: 2018-07-06 — End: 2018-07-06
  Administered 2018-07-06: 0 mL via INTRAVENOUS
  Administered 2018-07-06: 1000 mL via INTRAVENOUS

## 2018-07-06 MED ORDER — METOPROLOL TARTRATE 25 MG TABLET
25.00 mg | ORAL_TABLET | Freq: Two times a day (BID) | ORAL | Status: DC
Start: 2018-07-06 — End: 2018-07-06

## 2018-07-06 MED ORDER — SODIUM CHLORIDE 0.9 % INTRAVENOUS SOLUTION
INTRAVENOUS | Status: DC
Start: 2018-07-06 — End: 2018-07-06

## 2018-07-06 MED ORDER — SCOPOLAMINE 1 MG OVER 3 DAYS TRANSDERMAL PATCH
1.00 | MEDICATED_PATCH | TRANSDERMAL | Status: DC
Start: 2018-07-06 — End: 2018-07-07
  Administered 2018-07-06: 1 via TRANSDERMAL
  Filled 2018-07-06: qty 1

## 2018-07-06 MED ORDER — HYDROCODONE 5 MG-ACETAMINOPHEN 325 MG TABLET
2.0000 | ORAL_TABLET | ORAL | Status: DC | PRN
Start: 2018-07-06 — End: 2018-07-06

## 2018-07-06 MED ORDER — ONDANSETRON HCL (PF) 4 MG/2 ML INJECTION SOLUTION
4.00 mg | Freq: Four times a day (QID) | INTRAMUSCULAR | Status: DC | PRN
Start: 2018-07-06 — End: 2018-08-08

## 2018-07-06 MED ORDER — ACETAMINOPHEN 325 MG TABLET
650.00 mg | ORAL_TABLET | ORAL | Status: DC | PRN
Start: 2018-07-06 — End: 2018-07-09
  Administered 2018-07-06 – 2018-07-09 (×9): 650 mg via GASTROSTOMY
  Filled 2018-07-06 (×10): qty 2

## 2018-07-06 MED ORDER — SODIUM CHLORIDE 0.9 % INTRAVENOUS SOLUTION
5.0000 mg/h | INTRAVENOUS | Status: DC
Start: 2018-07-06 — End: 2018-07-07
  Administered 2018-07-06: 0 mg/h via INTRAVENOUS
  Filled 2018-07-06: qty 20

## 2018-07-06 MED ORDER — HYDRALAZINE 20 MG/ML INJECTION SOLUTION
10.0000 mg | INTRAMUSCULAR | Status: DC | PRN
Start: 2018-07-06 — End: 2018-07-11
  Administered 2018-07-06: 10 mg via INTRAVENOUS
  Filled 2018-07-06: qty 1

## 2018-07-06 MED ORDER — ELECTROLYTE-A INTRAVENOUS SOLUTION BOLUS
1000.0000 mL | Freq: Once | INTRAVENOUS | Status: AC
Start: 2018-07-06 — End: 2018-07-06
  Administered 2018-07-06: 0 mL via INTRAVENOUS
  Administered 2018-07-06: 1000 mL via INTRAVENOUS

## 2018-07-06 MED ORDER — MORPHINE 2 MG/ML INTRAVENOUS SYRINGE
2.0000 mg | INJECTION | INTRAVENOUS | Status: DC | PRN
Start: 2018-07-06 — End: 2018-07-14
  Administered 2018-07-10 – 2018-07-11 (×2): 2 mg via INTRAVENOUS
  Filled 2018-07-06 (×2): qty 1

## 2018-07-06 MED ORDER — ACETAMINOPHEN 325 MG/10.15 ML ORAL SUSPENSION
650.0000 mg | ORAL | Status: DC | PRN
Start: 2018-07-06 — End: 2018-07-06

## 2018-07-06 MED ORDER — HYDROCODONE 5 MG-ACETAMINOPHEN 325 MG TABLET
1.0000 | ORAL_TABLET | ORAL | Status: DC | PRN
Start: 2018-07-06 — End: 2018-07-06

## 2018-07-06 MED ORDER — HYDROCODONE 5 MG-ACETAMINOPHEN 325 MG TABLET
1.00 | ORAL_TABLET | ORAL | Status: DC | PRN
Start: 2018-07-06 — End: 2018-07-15
  Administered 2018-07-10: 1 via GASTROSTOMY
  Filled 2018-07-06: qty 1

## 2018-07-06 MED ORDER — DOCUSATE SODIUM 50 MG/5 ML ORAL LIQUID
100.00 mg | Freq: Every day | ORAL | Status: DC | PRN
Start: 2018-07-06 — End: 2018-07-07

## 2018-07-06 MED ORDER — SENNOSIDES 8.6 MG-DOCUSATE SODIUM 50 MG TABLET
1.0000 | ORAL_TABLET | Freq: Every day | ORAL | Status: DC
Start: 2018-07-06 — End: 2018-07-06

## 2018-07-06 MED ORDER — INSULIN LISPRO 100 UNIT/ML SUB-Q SSIP
0.00 [IU] | INJECTION | SUBCUTANEOUS | Status: DC | PRN
Start: 2018-07-06 — End: 2018-07-07
  Administered 2018-07-06 (×3): 2 [IU] via SUBCUTANEOUS
  Filled 2018-07-06: qty 3

## 2018-07-06 MED ORDER — GELATIN MATRIX SEALANT (FLOSEAL) 5 ML KIT
PACK | CUTANEOUS | Status: AC
Start: 2018-07-06 — End: 2018-07-06
  Filled 2018-07-06: qty 1

## 2018-07-06 MED ORDER — LEVETIRACETAM 500 MG/5 ML INTRAVENOUS SOLUTION
500.0000 mg | Freq: Two times a day (BID) | INTRAVENOUS | Status: DC
Start: 2018-07-06 — End: 2018-07-06

## 2018-07-06 MED ORDER — METOPROLOL TARTRATE 25 MG TABLET
25.00 mg | ORAL_TABLET | Freq: Two times a day (BID) | ORAL | Status: DC
Start: 2018-07-06 — End: 2018-07-06
  Administered 2018-07-06: 0 mg via ORAL

## 2018-07-06 MED ORDER — SENNA LEAF EXTRACT 176 MG/5 ML ORAL SYRUP
10.0000 mL | ORAL_SOLUTION | Freq: Two times a day (BID) | ORAL | Status: DC
Start: 2018-07-06 — End: 2018-07-16
  Administered 2018-07-06: 352 mg via GASTROSTOMY
  Administered 2018-07-06: 0 mg via GASTROSTOMY
  Administered 2018-07-07: 352 mg via GASTROSTOMY
  Administered 2018-07-07: 0 mg via GASTROSTOMY
  Administered 2018-07-08 (×2): 352 mg via GASTROSTOMY
  Administered 2018-07-09 – 2018-07-10 (×4): 0 mg via GASTROSTOMY
  Administered 2018-07-10 – 2018-07-12 (×4): 352 mg via GASTROSTOMY
  Administered 2018-07-12 – 2018-07-15 (×7): 0 mg via GASTROSTOMY
  Filled 2018-07-06 (×8): qty 15

## 2018-07-06 MED ORDER — FENTANYL (PF) 50 MCG/ML INJECTION SOLUTION
25.0000 ug | INTRAMUSCULAR | Status: DC | PRN
Start: 2018-07-06 — End: 2018-07-06
  Administered 2018-07-06: 25 ug via INTRAVENOUS
  Filled 2018-07-06 (×2): qty 2

## 2018-07-06 MED ORDER — HYDROCODONE 5 MG-ACETAMINOPHEN 325 MG TABLET
2.00 | ORAL_TABLET | ORAL | Status: DC | PRN
Start: 2018-07-06 — End: 2018-07-15
  Administered 2018-07-10: 2 via GASTROSTOMY
  Filled 2018-07-06: qty 2

## 2018-07-06 MED ORDER — METOPROLOL TARTRATE 25 MG TABLET
25.00 mg | ORAL_TABLET | Freq: Two times a day (BID) | ORAL | Status: DC
Start: 2018-07-06 — End: 2018-07-12
  Administered 2018-07-06 – 2018-07-11 (×11): 25 mg via GASTROSTOMY
  Filled 2018-07-06 (×11): qty 1

## 2018-07-06 MED ORDER — SODIUM CHLORIDE 0.9 % (FLUSH) INJECTION SYRINGE
2.0000 mL | INJECTION | Freq: Three times a day (TID) | INTRAMUSCULAR | Status: DC
Start: 2018-07-06 — End: 2018-07-06

## 2018-07-06 MED ORDER — SODIUM CHLORIDE 0.9 % (FLUSH) INJECTION SYRINGE
2.0000 mL | INJECTION | INTRAMUSCULAR | Status: DC | PRN
Start: 2018-07-06 — End: 2018-07-06

## 2018-07-06 MED ORDER — LABETALOL 20 MG/4 ML (5 MG/ML) INTRAVENOUS SYRINGE
INJECTION | INTRAVENOUS | Status: AC
Start: 2018-07-06 — End: 2018-07-06
  Administered 2018-07-06: 10 mg via INTRAVENOUS
  Filled 2018-07-06: qty 4

## 2018-07-06 MED ADMIN — lactated Ringers intravenous solution: NDC 00264775000

## 2018-07-06 MED ADMIN — sodium chloride 0.9 % intravenous solution: GASTROSTOMY | @ 18:00:00 | NDC 00338004904

## 2018-07-06 MED ADMIN — nicotine 14 mg/24 hr daily transdermal patch: SUBCUTANEOUS | @ 06:00:00

## 2018-07-06 NOTE — Brief Op Note (Signed)
[facility]                                                     BRIEF OPERATIVE NOTE    Patient Name: Madgeline, Rayo Number: [id number]  Date of Service: 07/06/2018   Date of Birth: [birth year]    All elements must be documented.    Pre-Operative Diagnosis: Left tentorial brain tumor  Post-Operative Diagnosis: Left tentorial meningioma  Procedure(s)/Description:  Left temporal craniotomy for resection of left tentorial meningioma  Findings/Complexity (inherent to the procedure performed): see dictated note    Attending Surgeon: Margart Sickles  Assistant(s): Cheyuo    Anesthesia Type: General  Estimated Blood Loss:  400 cc  Blood Given: None  Fluids Given: Crystalloids  Complications (not routinely expected or not inherent to difficulty/nature of procedure): None  Characteristic Event (routinely expected or inherent to the difficulty/nature of the procedure):  None  Did the use of current and/or prior Anticoagulants impact the outcome of the case? no  Wound Class: Clean Wound: Uninfected operative wounds in which no inflammation occurred    Tubes: None  Drains: Lumbar Drain and JP drain  Specimens/ Cultures: Frozen=meningioma  Implants: 3 dog bones and six 4 mm screws           Disposition: Patient taken to CT scanner stat because of a blown pupil  Condition: Taken back to OR for evacuation of hematoma in tumor bed    Hipolito Bayley, MD PhD

## 2018-07-06 NOTE — OR Nursing (Signed)
Patient's daughter updated per Dr. Margart Sickles.

## 2018-07-06 NOTE — Brief Op Note (Signed)
[facility]                                                     BRIEF OPERATIVE NOTE    Patient Name: [name], [name] Number: [id number]  Date of Service: 07/06/2018   Date of Birth: 1960/10/31    All elements must be documented.    Pre-Operative Diagnosis: S/p left temporal craniotomy for tumor resection, with development of tumor bed hematoma postop  Post-Operative Diagnosis: Same  Procedure(s)/Description:  Left decompressive craniectomy for evacuation of hematoma in tumor bed  Findings/Complexity (inherent to the procedure performed): see dictated note    Attending Surgeon: Margart Sickles  Assistant(s): Cheyuo    Anesthesia Type: General  Estimated Blood Loss:  200 ml  Blood Given: 1 unit platelets, 1 unit PRBC  Fluids Given: Crystalloids  Complications (not routinely expected or not inherent to difficulty/nature of procedure): None  Characteristic Event (routinely expected or inherent to the difficulty/nature of the procedure): None  Did the use of current and/or prior Anticoagulants impact the outcome of the case? no  Wound Class: Clean Wound: Uninfected operative wounds in which no inflammation occurred    Tubes: None  Drains: Terrial Rhodes Drain and Lumbar Drain  Specimens/ Cultures: None  Implants: none           Disposition: ICU - intubated and hemodynamically stable.  Condition: stable    Hipolito Bayley, MD PhD

## 2018-07-06 NOTE — Care Plan (Signed)
Problem: Adult Inpatient Plan of Care  Goal: Plan of Care Review  Outcome: Ongoing (see interventions/notes)  Goal: Patient-Specific Goal (Individualization)  Outcome: Ongoing (see interventions/notes)  Flowsheets (Taken 07/06/2018 0900 by Laverna Peace, RN)  Individualized Care Needs: Personal blanket from home.  Anxieties, Fears or Concerns: Whether patient will wake up and irreversible damage after waking up.  Patient-Specific Goals (Include Timeframe): Wake up as soon as possible.  Goal: Absence of Hospital-Acquired Illness or Injury  Outcome: Ongoing (see interventions/notes)  Goal: Optimal Comfort and Wellbeing  Outcome: Ongoing (see interventions/notes)  Goal: Rounds/Family Conference  Outcome: Ongoing (see interventions/notes)     Problem: Communication Impairment (Mechanical Ventilation, Invasive)  Goal: Effective Communication  Outcome: Ongoing (see interventions/notes)     Problem: Device-Related Complication Risk (Mechanical Ventilation, Invasive)  Goal: Optimal Device Function  Outcome: Ongoing (see interventions/notes)     Problem: Inability to Wean (Mechanical Ventilation, Invasive)  Goal: Mechanical Ventilation Liberation  Outcome: Ongoing (see interventions/notes)     Problem: Nutrition Impairment (Mechanical Ventilation, Invasive)  Goal: Optimal Nutrition Delivery  Outcome: Ongoing (see interventions/notes)     Problem: Skin and Tissue Injury (Mechanical Ventilation, Invasive)  Goal: Absence of Device-Related Skin and Tissue Injury  Outcome: Ongoing (see interventions/notes)     Problem: Ventilator-Induced Lung Injury (Mechanical Ventilation, Invasive)  Goal: Absence of Ventilator-Induced Lung Injury  Outcome: Ongoing (see interventions/notes)     Problem: Fall Injury Risk  Goal: Absence of Fall and Fall-Related Injury  Outcome: Ongoing (see interventions/notes)     Problem: Skin Injury Risk Increased  Goal: Skin Health and Integrity  Outcome: Ongoing (see interventions/notes)     Problem:  Non-violent/Non-Self Destructive Restraints  Goal: Alternative methods tried prior to restraints  Outcome: Ongoing (see interventions/notes)  Goal: Patient free from injury and discomfort  Outcome: Ongoing (see interventions/notes)  Goal: Autonomy maintained at the highest possible level  Outcome: Ongoing (see interventions/notes)  Goal: Need for restraints reassessed per policy  Outcome: Ongoing (see interventions/notes)  Goal: Patient education provided  Outcome: Ongoing (see interventions/notes)  Goal: Problem Interventions  Outcome: Ongoing (see interventions/notes)     Problem: Hypertension Comorbidity  Goal: Blood Pressure in Desired Range  Outcome: Ongoing (see interventions/notes)     Problem: Depression  Goal: Improved Mood  Outcome: Ongoing (see interventions/notes)

## 2018-07-06 NOTE — Nurses Notes (Signed)
Screened patient for depression; patient intubated and unable to answer question. All answers provided by MPOA at bedside.

## 2018-07-06 NOTE — Anesthesia Transfer of Care (Signed)
ANESTHESIA TRANSFER OF CARE   [name] is a 57 y.o. ,female, Weight: 78.2 kg (172 lb 6.4 oz)   had Procedure(s):  CRANIOTOMY  performed  07/06/18   Primary Service: [name], MD    Past Medical History:   Diagnosis Date   . Anxiety    . Arthropathy    . Cancer (CMS HCC)     brain tumor   . Dyspnea on exertion    . Esophageal reflux     does not take meds   . Heart murmur     benign   . HTN (hypertension)    . Hyperlipemia    . Muscle weakness     right sided weakness   . Palpitations    . Shortness of breath    . Wears glasses     reading      Allergy History as of 07/06/18      No Known Allergies              I completed my transfer of care / handoff to the receiving personnel during which we discussed:  Access, All key/critical aspects of case discussed, Antibiotics, Fluids/Product, Labs, Airway, Analgesia, Expectation of post procedure, Gave opportunity for questions and acknowledgement of understanding and PMHx    Post Location: ICU                                          Additional Info:[name] is a 57 y.o. transported to [facility] 11 post operatively in stable condition and is intubated at this time.  All questions were answered for the SICU team and signout was given to the SICU nurse including but not limited to significant past medical history, allergies, clinical course during the surgery and post-operative considerations.    Roderic Palau A. Bond, DO, MPH 07/06/2018, 01:46  PGY-3/CA-2 Department of Anesthesiology  Pager [phone]                          Last OR Temp: Temperature: 37.4 C (99.3 F)(temp in OR just prior to transfer to CT scanner)  ABG:  PH (ARTERIAL)   Date Value Ref Range Status   07/05/2018 7.31 (L) 7.35 - 7.45 Final     PH (T)   Date Value Ref Range Status   07/05/2018 7.31 (L) 7.35 - 7.45 Final     PCO2 (ARTERIAL)   Date Value Ref Range Status   07/05/2018 39.0 35.0 - 45.0 mm/Hg Final     PO2 (ARTERIAL)   Date Value Ref Range Status   07/05/2018 123.0 (H) 72.0 - 100.0  mm/Hg Final     SODIUM   Date Value Ref Range Status   07/05/2018 141 137 - 145 mmol/L Final     POTASSIUM   Date Value Ref Range Status   06/08/2018 3.9 3.5 - 5.1 mmol/L Final     KETONES   Date Value Ref Range Status   06/08/2018 Negative Negative mg/dL Final     WHOLE BLOOD POTASSIUM   Date Value Ref Range Status   07/05/2018 3.6 3.5 - 4.6 mmol/L Final     CHLORIDE   Date Value Ref Range Status   07/05/2018 112 (H) 101 - 111 mmol/L Final     CALCIUM   Date Value Ref Range Status   06/08/2018 9.6 8.5 - 10.2 mg/dL Final  Calculated P Axis   Date Value Ref Range Status   06/08/2018 34 degrees Final     Calculated R Axis   Date Value Ref Range Status   06/08/2018 -20 degrees Final     Calculated T Axis   Date Value Ref Range Status   06/08/2018 120 degrees Final     IONIZED CALCIUM   Date Value Ref Range Status   07/05/2018 1.08 (L) 1.10 - 1.35 mmol/L Final     LACTATE   Date Value Ref Range Status   07/05/2018 3.1 (H) 0.0 - 1.3 mmol/L Final     HEMOGLOBIN   Date Value Ref Range Status   07/05/2018 10.3 (L) 12.0 - 18.0 g/dL Final     OXYHEMOGLOBIN   Date Value Ref Range Status   07/05/2018 96.8 85.0 - 98.0 % Final     CARBOXYHEMOGLOBIN   Date Value Ref Range Status   07/05/2018 0.0 0.0 - 2.5 % Final     MET-HEMOGLOBIN   Date Value Ref Range Status   07/05/2018 0.0 0.0 - 2.0 % Final     BASE DEFICIT   Date Value Ref Range Status   07/05/2018 6.2 (H) 0.0 - 3.0 mmol/L Final     BICARBONATE (ARTERIAL)   Date Value Ref Range Status   07/05/2018 20.1 18.0 - 26.0 mmol/L Final     TEMPERATURE, COMP   Date Value Ref Range Status   07/05/2018 37.0 15.0 - 40.0 C Final     Airway:  EndoTracheal Tube Oral;Cuffed 7.5 Lip (Active)   Airway Secure Device 07/05/2018 10:15 PM     Blood pressure (!) 123/59, pulse 77, temperature 37.4 C (99.3 F), resp. rate 19, height 1.676 m (5' 6" ), weight 78.2 kg (172 lb 6.4 oz), SpO2 99 %.

## 2018-07-06 NOTE — OR Nursing (Signed)
Skull flap placed in freezer 1, shelf 3.

## 2018-07-06 NOTE — Ancillary Notes (Signed)
Delivered a protective helmet at the request of neurosurgery service to protect post-op    We will follow on an as needed basis.  [name], [place] [phone]

## 2018-07-06 NOTE — Respiratory Therapy (Signed)
VENTILATOR - CPAP(PS) / SPONTANEOUS CONTINUOUS Discontinue   Duration: Until Specified    Priority: Routine       Question Answer Comment   FIO2 (%) 30    Peep(cm/H2O) 5    Pressure Support(cm/H2O) 8    Indications IMPROVE DISTRIBUTION            Rt will continue to monitor

## 2018-07-06 NOTE — Care Plan (Signed)
pt came from [facility] around 0145. No left bone flap. JP from crani site; drainage bloody. GCS 1,4,1. Lumbar drain clamped. neuro checks q1. 1L bolus for artline BPs 70s-80s/40s. BP systolic goal 500-938. Restraints initiated. OG placed. Plan is to remove Lumbar drain tomorrow. assessments and vitals per doc flow sheets.       Problem: Adult Inpatient Plan of Care  Goal: Plan of Care Review  Outcome: Ongoing (see interventions/notes)  Flowsheets (Taken 07/06/2018 0551)  Plan of Care Reviewed With: patient; family  Outcome Summary: pt had postop hemorrhage in tumor bed, had to go back to OR for L craniectomy. GCS 1,4,1  Progress: declining  Goal: Patient-Specific Goal (Individualization)  Outcome: Ongoing (see interventions/notes)  Flowsheets (Taken 07/06/2018 0551)  Individualized Care Needs: warm blankets, close BP monitoring with artline  Anxieties, Fears or Concerns: uta; ett  Patient-Specific Goals (Include Timeframe): uta; ett  Goal: Absence of Hospital-Acquired Illness or Injury  Outcome: Ongoing (see interventions/notes)  Goal: Optimal Comfort and Wellbeing  Outcome: Ongoing (see interventions/notes)     Problem: Communication Impairment (Mechanical Ventilation, Invasive)  Goal: Effective Communication  Outcome: Ongoing (see interventions/notes)     Problem: Device-Related Complication Risk (Mechanical Ventilation, Invasive)  Goal: Optimal Device Function  Outcome: Ongoing (see interventions/notes)     Problem: Inability to Wean (Mechanical Ventilation, Invasive)  Goal: Mechanical Ventilation Liberation  Outcome: Ongoing (see interventions/notes)     Problem: Nutrition Impairment (Mechanical Ventilation, Invasive)  Goal: Optimal Nutrition Delivery  Outcome: Ongoing (see interventions/notes)     Problem: Skin and Tissue Injury (Mechanical Ventilation, Invasive)  Goal: Absence of Device-Related Skin and Tissue Injury  Outcome: Ongoing (see interventions/notes)     Problem: Ventilator-Induced Lung Injury (Mechanical  Ventilation, Invasive)  Goal: Absence of Ventilator-Induced Lung Injury  Outcome: Ongoing (see interventions/notes)     Problem: Fall Injury Risk  Goal: Absence of Fall and Fall-Related Injury  Outcome: Ongoing (see interventions/notes)     Problem: Skin Injury Risk Increased  Goal: Skin Health and Integrity  Outcome: Ongoing (see interventions/notes)     Problem: Non-violent/Non-Self Destructive Restraints  Goal: Alternative methods tried prior to restraints  Outcome: Ongoing (see interventions/notes)  Goal: Patient free from injury and discomfort  Outcome: Ongoing (see interventions/notes)  Goal: Autonomy maintained at the highest possible level  Outcome: Ongoing (see interventions/notes)  Goal: Need for restraints reassessed per policy  Outcome: Ongoing (see interventions/notes)  Goal: Patient education provided  Outcome: Ongoing (see interventions/notes)  Goal: Problem Interventions  Outcome: Ongoing (see interventions/notes)     Problem: Hypertension Comorbidity  Goal: Blood Pressure in Desired Range  Outcome: Ongoing (see interventions/notes)    Patient Goals Based on Modifiable Risk Factors    The following modifiable risk factors were discussed with patient:     Hypertension    Goal:   Maintain blood pressure less than 140/90 (or less than 130/90 for patients with diabetes or chronic kidney disease).    Patient's selected lifestyle modification(s):  Routinely take antihypertensive medications as prescribed    Patient/Family/Caregiver response to plan:    Patient is intubated and not following commands. Unable to assess if pt received education on taking hypertensive medications as prescribed.

## 2018-07-06 NOTE — Anesthesia OR-ICU Handoff (Signed)
Anesthesia ICU Transfer of Care  [name] is a 57 y.o. ,female, Weight: 78.2 kg (172 lb 6.4 oz)   had Procedure(s):  CRANIOTOMY  performed  07/06/18   Primary Service: [name], MD    Past Medical History:   Diagnosis Date   . Anxiety    . Arthropathy    . Cancer (CMS HCC)     brain tumor   . Dyspnea on exertion    . Esophageal reflux     does not take meds   . Heart murmur     benign   . HTN (hypertension)    . Hyperlipemia    . Muscle weakness     right sided weakness   . Palpitations    . Shortness of breath    . Wears glasses     reading      Allergy History as of 07/06/18      No Known Allergies              I completed my ICU transfer of care/ Handoff to the ICU receiving personnel during which we discussed :  Access, All key and critical aspects of case discussed, Fluids/Product, Labs, Antibiotics, Airway, Analgesia, Expectation of post procedure, Gave opportunity for questions and acknowledgement of understanding and PMHx                                                Additional Info:[name] is a 57 y.o. transported to [facility]U 11 post operatively in stable condition and is intubated at this time.  All questions were answered for the SICU team and signout was given to the SICU nurse including but not limited to significant past medical history, allergies, clinical course during the surgery and post-operative considerations.    Roderic Palau A. Bond, DO, MPH 07/06/2018, 01:46  PGY-3/CA-2 Department of Anesthesiology  Pager [phone]                           Last OR Temp: Temperature: 37.4 C (99.3 F)(temp in OR just prior to transfer to CT scanner)  ABG:  PH (ARTERIAL)   Date Value Ref Range Status   07/05/2018 7.31 (L) 7.35 - 7.45 Final     PH (T)   Date Value Ref Range Status   07/05/2018 7.31 (L) 7.35 - 7.45 Final     PCO2 (ARTERIAL)   Date Value Ref Range Status   07/05/2018 39.0 35.0 - 45.0 mm/Hg Final     PO2 (ARTERIAL)   Date Value Ref Range Status   07/05/2018 123.0 (H) 72.0 - 100.0  mm/Hg Final     SODIUM   Date Value Ref Range Status   07/05/2018 141 137 - 145 mmol/L Final     POTASSIUM   Date Value Ref Range Status   06/08/2018 3.9 3.5 - 5.1 mmol/L Final     KETONES   Date Value Ref Range Status   06/08/2018 Negative Negative mg/dL Final     WHOLE BLOOD POTASSIUM   Date Value Ref Range Status   07/05/2018 3.6 3.5 - 4.6 mmol/L Final     CHLORIDE   Date Value Ref Range Status   07/05/2018 112 (H) 101 - 111 mmol/L Final     CALCIUM   Date Value Ref Range Status   06/08/2018 9.6 8.5 -  10.2 mg/dL Final     Calculated P Axis   Date Value Ref Range Status   06/08/2018 34 degrees Final     Calculated R Axis   Date Value Ref Range Status   06/08/2018 -20 degrees Final     Calculated T Axis   Date Value Ref Range Status   06/08/2018 120 degrees Final     IONIZED CALCIUM   Date Value Ref Range Status   07/05/2018 1.08 (L) 1.10 - 1.35 mmol/L Final     LACTATE   Date Value Ref Range Status   07/05/2018 3.1 (H) 0.0 - 1.3 mmol/L Final     HEMOGLOBIN   Date Value Ref Range Status   07/05/2018 10.3 (L) 12.0 - 18.0 g/dL Final     OXYHEMOGLOBIN   Date Value Ref Range Status   07/05/2018 96.8 85.0 - 98.0 % Final     CARBOXYHEMOGLOBIN   Date Value Ref Range Status   07/05/2018 0.0 0.0 - 2.5 % Final     MET-HEMOGLOBIN   Date Value Ref Range Status   07/05/2018 0.0 0.0 - 2.0 % Final     BASE DEFICIT   Date Value Ref Range Status   07/05/2018 6.2 (H) 0.0 - 3.0 mmol/L Final     BICARBONATE (ARTERIAL)   Date Value Ref Range Status   07/05/2018 20.1 18.0 - 26.0 mmol/L Final     TEMPERATURE, COMP   Date Value Ref Range Status   07/05/2018 37.0 15.0 - 40.0 C Final     Airway:  EndoTracheal Tube Oral;Cuffed 7.5 Lip (Active)   Airway Secure Device 07/05/2018 10:15 PM

## 2018-07-06 NOTE — Nurses Notes (Signed)
Restraint Continuation      Patient continues to have the following condition: AMS, localizing toward ETT and agitation.       Least restrictive alternatives attempted : Increase patient observation, concealed tubes/lines, decreased/removed stimulus and assessed meds/medical problems    Patient continues to exhibit the following behaviors: AMS, agitation, localizing toward ETT.    The restraint continued to facilitate medical/surgical treatment to ensure safety.    The patient will continue to be evaluated and assessments documented on the flowsheet to ensure that the patient is released from the restraint at the earliest possible time

## 2018-07-06 NOTE — Progress Notes (Signed)
[facility]  NEUROSURGERY   PROGRESS NOTE      [name], [name], 57 y.o. female  Date of Admission:  07/05/2018  Date of Service: 07/06/2018  Date of Birth:  03/15/1961    Referring Physician:  No ref. provider found    Post Op Day: 1 Day Post-Op S/P Procedure(s) (LRB):  CRANIOTOMY (Left)    Chief Complaint: Left tentorial meningioma  Subjective: Patient developed hematoma in tumor bed postop requiring return to OR for craniectomy and evacuation    Vital Signs:  Temp (24hrs) Max:37.4 C (93.7 F)      Systolic (34KAJ), GOT:157 , Min:113 , WIO:035     Diastolic (59RCB), ULA:45, Min:59, Max:101    Temp  Avg: 36.5 C (97.7 F)  Min: 35.9 C (96.6 F)  Max: 37.4 C (99.3 F)  Pulse  Avg: 70  Min: 63  Max: 77  Resp  Avg: 16.5  Min: 14  Max: 19  SpO2  Avg: 98.3 %  Min: 96 %  Max: 100 %  Pain Score (Numeric, Faces): 5    Min/Max/Avg ICP/CPP last 24hrs:   No data recorded    Today's Physical Exam:  Remains intubated and sedated  - To rexamine once sedation is off  JP drain in place  Lumbar drain in place  Incision - staples    Current Medications:  NS bolus infusion 40 mL, 40 mL, Intravenous, Once PRN  NS flush syringe, 2 mL, Intracatheter, Q8HRS    And  NS flush syringe, 2-6 mL, Intracatheter, Q1 MIN PRN  NS premix infusion, , Intravenous, Continuous        I/O:  I/O last 24 hours:      Intake/Output Summary (Last 24 hours) at 07/06/2018 0202  Last data filed at 07/06/2018 0140  Gross per 24 hour   Intake 4885 ml   Output 2675 ml   Net 2210 ml     I/O current shift:  12/13 1900 - 12/14 0659  In: 1935 [I.V.:1350; Blood:585]  Out: 940 [Urine:480; Blood:460]    Antibiotics: Date Started Date Completed   1.     2.     3.     4.       Nutrition/Residuals:  No diet orders on file    Labs  Please indicate ordered or reviewed)  Reviewed:   Lab Results Today:    Results for orders placed or performed during the hospital encounter of 07/05/18 (from the past 24 hour(s))   TYPE AND SCREEN   Result Value Ref Range    UNITS  ORDERED 2     ABO/RH(D) O POSITIVE     ANTIBODY SCREEN NEGATIVE     SPECIMEN EXPIRATION DATE 07/08/2018    CROSSMATCH RED CELLS - UNITS , 2 Units   Result Value Ref Range    Coding System ISBT128     UNIT NUMBER [id number]     BLOOD COMPONENT TYPE LR RBC, Adsol1, 04710     UNIT DIVISION 00     UNIT DISPENSE STATUS RELEASED FROM ALLOCATION     TRANSFUSION STATUS OK TO TRANSFUSE     IS CROSSMATCH Electronically Compatible     Product Code G5003B04     Coding System ISBT128     UNIT NUMBER [id number]     BLOOD COMPONENT TYPE LR RBC, Adsol1, 04710     UNIT DIVISION 00     UNIT DISPENSE STATUS RELEASED FROM ALLOCATION     TRANSFUSION STATUS OK TO TRANSFUSE  IS CROSSMATCH Electronically Compatible     Product Code [id number]     Coding System ISBT128     UNIT NUMBER [id number]     BLOOD COMPONENT TYPE LR RBC, Adsol1, 04710     UNIT DIVISION 00     UNIT DISPENSE STATUS RELEASED FROM ALLOCATION     TRANSFUSION STATUS OK TO TRANSFUSE     IS CROSSMATCH Electronically Compatible     Product Code [phone]     Coding System ISBT128     UNIT NUMBER [id number]     BLOOD COMPONENT TYPE LR RBC, Adsol1, 04710     UNIT DIVISION 00     UNIT DISPENSE STATUS ISSUED     TRANSFUSION STATUS OK TO TRANSFUSE     IS CROSSMATCH Electronically Compatible     Product Code [id number]    ARTERIAL BLOOD GAS/COOX PANEL/ELECTROLYTES/LACTATE (NA, K, CL, iCA, GLU, LACTATE) -- TEMP COMP   Result Value Ref Range    %FIO2 (ARTERIAL) 53 %    PH (ARTERIAL) 7.37 7.35 - 7.45    PCO2 (ARTERIAL) 39.0 35.0 - 45.0 mm/Hg    PO2 (ARTERIAL) 248.0 (H) 72.0 - 100.0 mm/Hg    BASE DEFICIT 2.5 0.0 - 3.0 mmol/L    BICARBONATE (ARTERIAL) 22.9 18.0 - 26.0 mmol/L    TEMPERATURE, COMP 36.3 15.0 - 40.0 C    PH (T) 7.38 7.35 - 7.45    CO2, TOTAL 38.0 35.0 - 45.0 mm/Hg    (T) PO2 245.0 (H) 72.0 - 100.0 mm/Hg    SODIUM 138 137 - 145 mmol/L    WHOLE BLOOD POTASSIUM 3.3 (L) 3.5 - 4.6 mmol/L    CHLORIDE 109 101 - 111 mmol/L    IONIZED CALCIUM 1.18 1.10 - 1.35 mmol/L     GLUCOSE 103 60 - 105 mg/dL    LACTATE 1.6 (H) 0.0 - 1.3 mmol/L    HEMOGLOBIN 13.2 12.0 - 18.0 g/dL    OXYHEMOGLOBIN 95.8 85.0 - 98.0 %    CARBOXYHEMOGLOBIN 3.8 (H) 0.0 - 2.5 %    MET-HEMOGLOBIN 0.4 0.0 - 2.0 %    O2CT 18.3 15.7 - 24.3 %    PAO2/FIO2 RATIO 468 <=200   ARTERIAL BLOOD GAS/COOX PANEL/ELECTROLYTES/LACTATE (NA, K, CL, iCA, GLU, LACTATE) -- TEMP COMP   Result Value Ref Range    %FIO2 (ARTERIAL) 48 %    PH (ARTERIAL) 7.32 (L) 7.35 - 7.45    PCO2 (ARTERIAL) 41.0 35.0 - 45.0 mm/Hg    PO2 (ARTERIAL) 204.0 (H) 72.0 - 100.0 mm/Hg    BASE DEFICIT 4.8 (H) 0.0 - 3.0 mmol/L    BICARBONATE (ARTERIAL) 21.1 18.0 - 26.0 mmol/L    TEMPERATURE, COMP 35.9 15.0 - 40.0 C    PH (T) 7.34 (L) 7.35 - 7.45    CO2, TOTAL 39.0 35.0 - 45.0 mm/Hg    (T) PO2 198.0 (H) 72.0 - 100.0 mm/Hg    SODIUM 138 137 - 145 mmol/L    WHOLE BLOOD POTASSIUM 3.3 (L) 3.5 - 4.6 mmol/L    CHLORIDE 109 101 - 111 mmol/L    IONIZED CALCIUM 1.15 1.10 - 1.35 mmol/L    GLUCOSE 138 (H) 60 - 105 mg/dL    LACTATE 2.9 (H) 0.0 - 1.3 mmol/L    HEMOGLOBIN 13.0 12.0 - 18.0 g/dL    OXYHEMOGLOBIN 95.9 85.0 - 98.0 %    CARBOXYHEMOGLOBIN 3.8 (H) 0.0 - 2.5 %    MET-HEMOGLOBIN 0.3 0.0 - 2.0 %    O2CT 18.0 15.7 - 24.3 %  PAO2/FIO2 RATIO 425 <=200   ARTERIAL BLOOD GAS/COOX PANEL/ELECTROLYTES/LACTATE (NA, K, CL, iCA, GLU, LACTATE) -- TEMP COMP   Result Value Ref Range    %FIO2 (ARTERIAL) 45 %    PH (ARTERIAL) 7.33 (L) 7.35 - 7.45    PCO2 (ARTERIAL) 37.0 35.0 - 45.0 mm/Hg    PO2 (ARTERIAL) 206.0 (H) 72.0 - 100.0 mm/Hg    BASE DEFICIT 5.8 (H) 0.0 - 3.0 mmol/L    BICARBONATE (ARTERIAL) 20.4 18.0 - 26.0 mmol/L    TEMPERATURE, COMP 37.1 15.0 - 40.0 C    PH (T) 7.33 (L) 7.35 - 7.45    CO2, TOTAL 37.0 35.0 - 45.0 mm/Hg    (T) PO2 207.0 (H) 72.0 - 100.0 mm/Hg    SODIUM 139 137 - 145 mmol/L    WHOLE BLOOD POTASSIUM 3.8 3.5 - 4.6 mmol/L    CHLORIDE 109 101 - 111 mmol/L    IONIZED CALCIUM 1.09 (L) 1.10 - 1.35 mmol/L    GLUCOSE 159 (H) 60 - 105 mg/dL    LACTATE 3.1 (H) 0.0 - 1.3 mmol/L      HEMOGLOBIN 11.9 (L) 12.0 - 18.0 g/dL    OXYHEMOGLOBIN 96.2 85.0 - 98.0 %    CARBOXYHEMOGLOBIN 2.6 (H) 0.0 - 2.5 %    MET-HEMOGLOBIN 0.9 0.0 - 2.0 %    O2CT 16.6 15.7 - 24.3 %    PAO2/FIO2 RATIO 458 <=200   ARTERIAL BLOOD GAS/COOX PANEL/ELECTROLYTES/LACTATE (NA, K, CL, iCA, GLU, LACTATE) -- TEMP COMP   Result Value Ref Range    %FIO2 (ARTERIAL) 45 %    PH (ARTERIAL) 7.35 7.35 - 7.45    PCO2 (ARTERIAL) 36.0 35.0 - 45.0 mm/Hg    PO2 (ARTERIAL) 187.0 (H) 72.0 - 100.0 mm/Hg    BASE DEFICIT 5.1 (H) 0.0 - 3.0 mmol/L    BICARBONATE (ARTERIAL) 20.9 18.0 - 26.0 mmol/L    TEMPERATURE, COMP 37.0 15.0 - 40.0 C    PH (T) 7.35 7.35 - 7.45    CO2, TOTAL 36.0 35.0 - 45.0 mm/Hg    (T) PO2 187.0 (H) 72.0 - 100.0 mm/Hg    SODIUM 140 137 - 145 mmol/L    WHOLE BLOOD POTASSIUM 3.4 (L) 3.5 - 4.6 mmol/L    CHLORIDE 112 (H) 101 - 111 mmol/L    IONIZED CALCIUM 1.11 1.10 - 1.35 mmol/L    GLUCOSE 138 (H) 60 - 105 mg/dL    LACTATE 3.3 (H) 0.0 - 1.3 mmol/L    HEMOGLOBIN 12.2 12.0 - 18.0 g/dL    OXYHEMOGLOBIN 95.2 85.0 - 98.0 %    CARBOXYHEMOGLOBIN 1.8 0.0 - 2.5 %    MET-HEMOGLOBIN 1.6 0.0 - 2.0 %    O2CT 16.7 15.7 - 24.3 %    PAO2/FIO2 RATIO 416 <=200   ARTERIAL BLOOD GAS/COOX PANEL/ELECTROLYTES/LACTATE (NA, K, CL, iCA, GLU, LACTATE) -- TEMP COMP   Result Value Ref Range    %FIO2 (ARTERIAL) 44 %    PH (ARTERIAL) 7.33 (L) 7.35 - 7.45    PCO2 (ARTERIAL) 39.0 35.0 - 45.0 mm/Hg    PO2 (ARTERIAL) 181.0 (H) 72.0 - 100.0 mm/Hg    BASE DEFICIT 4.9 (H) 0.0 - 3.0 mmol/L    BICARBONATE (ARTERIAL) 21.1 18.0 - 26.0 mmol/L    TEMPERATURE, COMP 37.0 15.0 - 40.0 C    PH (T) 7.33 (L) 7.35 - 7.45    CO2, TOTAL 39.0 35.0 - 45.0 mm/Hg    (T) PO2 181.0 (H) 72.0 - 100.0 mm/Hg    SODIUM 141 137 -  145 mmol/L    WHOLE BLOOD POTASSIUM 3.8 3.5 - 4.6 mmol/L    CHLORIDE 112 (H) 101 - 111 mmol/L    IONIZED CALCIUM 1.14 1.10 - 1.35 mmol/L    GLUCOSE 145 (H) 60 - 105 mg/dL    LACTATE 3.3 (H) 0.0 - 1.3 mmol/L    HEMOGLOBIN 12.1 12.0 - 18.0 g/dL    OXYHEMOGLOBIN 96.2 85.0 -  98.0 %    CARBOXYHEMOGLOBIN 2.4 0.0 - 2.5 %    MET-HEMOGLOBIN 1.2 0.0 - 2.0 %    O2CT 16.7 15.7 - 24.3 %    PAO2/FIO2 RATIO 411 <=200   PRODUCT: PLATELETS - UNITS , 1 Units   Result Value Ref Range    Coding System ISBT128     UNIT NUMBER [id number]     BLOOD COMPONENT TYPE LR pheresis 12780     UNIT DIVISION 00     UNIT DISPENSE STATUS ISSUED     TRANSFUSION STATUS OK TO TRANSFUSE     Product Code I1443X54    ARTERIAL BLOOD GAS/COOX PANEL/ELECTROLYTES/LACTATE (NA, K, CL, iCA, GLU, LACTATE) -- TEMP COMP   Result Value Ref Range    %FIO2 (ARTERIAL) 39 %    PH (ARTERIAL) 7.31 (L) 7.35 - 7.45    PCO2 (ARTERIAL) 39.0 35.0 - 45.0 mm/Hg    PO2 (ARTERIAL) 123.0 (H) 72.0 - 100.0 mm/Hg    BASE DEFICIT 6.2 (H) 0.0 - 3.0 mmol/L    BICARBONATE (ARTERIAL) 20.1 18.0 - 26.0 mmol/L    TEMPERATURE, COMP 37.0 15.0 - 40.0 C    PH (T) 7.31 (L) 7.35 - 7.45    CO2, TOTAL 39.0 35.0 - 45.0 mm/Hg    (T) PO2 123.0 (H) 72.0 - 100.0 mm/Hg    SODIUM 141 137 - 145 mmol/L    WHOLE BLOOD POTASSIUM 3.6 3.5 - 4.6 mmol/L    CHLORIDE 112 (H) 101 - 111 mmol/L    IONIZED CALCIUM 1.08 (L) 1.10 - 1.35 mmol/L    GLUCOSE 167 (H) 60 - 105 mg/dL    LACTATE 3.1 (H) 0.0 - 1.3 mmol/L    HEMOGLOBIN 10.3 (L) 12.0 - 18.0 g/dL    OXYHEMOGLOBIN 96.8 85.0 - 98.0 %    CARBOXYHEMOGLOBIN 0.0 0.0 - 2.5 %    MET-HEMOGLOBIN 0.0 0.0 - 2.0 %    O2CT 14.2 (L) 15.7 - 24.3 %    PAO2/FIO2 RATIO 315 <=200   CBC   Result Value Ref Range    WBC 19.4 (H) 3.7 - 11.0 x10^3/uL    RBC 3.90 3.85 - 5.22 x10^6/uL    HGB 11.5 11.5 - 16.0 g/dL    HCT 34.5 (L) 34.8 - 46.0 %    MCV 88.5 78.0 - 100.0 fL    MCH 29.5 26.0 - 32.0 pg    MCHC 33.3 31.0 - 35.5 g/dL    RDW-CV 14.5 11.5 - 15.5 %    PLATELETS 256 150 - 400 x10^3/uL    MPV 9.8 8.7 - 12.5 fL       Assessment/ Plan:  There are no active hospital problems to display for this patient.    [name] is a 57 y.o. 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 POD0/0  --Admit to [facility] under Dr. Margart Sickles  --Pain control- tylenol prn, norco PRN  --Scopolamine patch  --CT brain 0500  --No dex given hemorrhage  --If ventricles enlarged on CT brain, will require EVD  --  Keep lumbar drain clamped. Possible removal tomorrow  --MRI brain when stable  --Frozen=meningioma. FU pathology  --Keppra 500 mg bid x7 days  --NPO+MIVF  --Will need helmet  --Antibiotics- nafcillin periop  --DVT ppx: SCDs  --Activity-as tol  --PT/OT     Hipolito Bayley, MD, PhD  07/06/2018, 02:06      I saw and examined the patient.  I reviewed the resident's note.  I agree with the findings and plan of care as documented in the resident's note.  Any exceptions/additions are edited/noted.    [name], MD

## 2018-07-06 NOTE — Nurses Notes (Signed)
Notified NCCU NP, Gorby, of pt's artline BP reading 70s-80s/40s. Orders given to bolus 1L of plasmalyte. Will administer and continue to monitor.

## 2018-07-06 NOTE — Ancillary Notes (Signed)
[place]  MRI Technologist Note        MRI has been completed.        Dyke Brackett, [facility] 07/06/2018, 23:49

## 2018-07-06 NOTE — Nurses Notes (Signed)
Paged service regarding axillary temperature of 38.5 and consistent tachycardia. Applied ice packs, adjusted room temperature, cool bath and increased skin exposure as interventions until OG placement is verified by xray and Tylenol can be given. Will continue to monitor.

## 2018-07-06 NOTE — Nurses Notes (Signed)
Restraint Initiation    Patient assessed and found to have the following condition withdrawn, intubated, reaching for ett, PIVs, and other lines.         Least restrictive alternatives attempted: Family called, Increase patient observation, moved closer to the nurses station, bed-exit alarm/motion detector, call light accessible, concealed tubes/lines, decreased/removed stimulus, toileting, frequent orientation, involved patient in conversation and provided familiar/personal items.    Patient is exhibiting the following behaviors: uncooperative and withdrawn.    The reason for application of restraint: patient attempting to remove artificial airway, patient attempting to pull at IV lines and patient attempting to pull at invasive medical tubes    The restraint was applied to facilitate medical/surgical treatment to ensure safety.    The patient will continue to be evaluated and assessments documented on the flowsheet to ensure that the patient is released from the restraint at the earliest possible time.

## 2018-07-06 NOTE — Nurses Notes (Signed)
Pt arrived on unit from OR. Primary RN at bedside. Service notified. See flowsheets for full assessment

## 2018-07-07 ENCOUNTER — Inpatient Hospital Stay (HOSPITAL_COMMUNITY)

## 2018-07-07 DIAGNOSIS — Z4682 Encounter for fitting and adjustment of non-vascular catheter: Secondary | ICD-10-CM

## 2018-07-07 LAB — URINALYSIS, MACROSCOPIC
BILIRUBIN: NEGATIVE mg/dL
COLOR: NORMAL
GLUCOSE: 50 mg/dL — AB
KETONES: NEGATIVE mg/dL
LEUKOCYTES: NEGATIVE WBCs/uL
NITRITE: NEGATIVE
PH: 5 (ref 5.0–8.0)
PROTEIN: NEGATIVE mg/dL
SPECIFIC GRAVITY: 1.014 (ref 1.005–1.030)
UROBILINOGEN: NEGATIVE mg/dL

## 2018-07-07 LAB — BASIC METABOLIC PANEL
ANION GAP: 8 mmol/L (ref 4–13)
ANION GAP: 7 mmol/L (ref 4–13)
ANION GAP: 4 mmol/L (ref 4–13)
ANION GAP: 8 mmol/L (ref 4–13)
BUN/CREA RATIO: 17 (ref 6–22)
BUN/CREA RATIO: 17 (ref 6–22)
BUN/CREA RATIO: 18 (ref 6–22)
BUN/CREA RATIO: 17 (ref 6–22)
BUN: 17 mg/dL (ref 8–25)
BUN: 14 mg/dL (ref 8–25)
BUN: 14 mg/dL (ref 8–25)
BUN: 12 mg/dL (ref 8–25)
CALCIUM: 8.4 mg/dL — ABNORMAL LOW (ref 8.5–10.2)
CALCIUM: 8.2 mg/dL — ABNORMAL LOW (ref 8.5–10.2)
CALCIUM: 8.1 mg/dL — ABNORMAL LOW (ref 8.5–10.2)
CALCIUM: 7.9 mg/dL — ABNORMAL LOW (ref 8.5–10.2)
CHLORIDE: 114 mmol/L — ABNORMAL HIGH (ref 96–111)
CHLORIDE: 116 mmol/L — ABNORMAL HIGH (ref 96–111)
CHLORIDE: 115 mmol/L — ABNORMAL HIGH (ref 96–111)
CHLORIDE: 114 mmol/L — ABNORMAL HIGH (ref 96–111)
CO2 TOTAL: 24 mmol/L (ref 22–32)
CO2 TOTAL: 25 mmol/L (ref 22–32)
CO2 TOTAL: 27 mmol/L (ref 22–32)
CO2 TOTAL: 24 mmol/L (ref 22–32)
CO2 TOTAL: 24 mmol/L (ref 22–32)
CREATININE: 1.01 mg/dL (ref 0.49–1.10)
CREATININE: 0.82 mg/dL (ref 0.49–1.10)
CREATININE: 0.78 mg/dL (ref 0.49–1.10)
CREATININE: 0.69 mg/dL (ref 0.49–1.10)
ESTIMATED GFR: >60 mL/min/{1.73_m2} (ref >60)
ESTIMATED GFR: >60 mL/min/1.73m?2 (ref >60)
ESTIMATED GFR: >60 mL/min/{1.73_m2} (ref >60)
ESTIMATED GFR: >60 mL/min/1.73m?2 (ref >60)
ESTIMATED GFR: >60 mL/min/1.73mˆ2 (ref >60)
ESTIMATED GFR: >60 mL/min/{1.73_m2} (ref >60)
GLUCOSE: 142 mg/dL — ABNORMAL HIGH (ref 65–139)
GLUCOSE: 127 mg/dL (ref 65–139)
GLUCOSE: 153 mg/dL — ABNORMAL HIGH (ref 65–139)
GLUCOSE: 135 mg/dL (ref 65–139)
POTASSIUM: 3.1 mmol/L — ABNORMAL LOW (ref 3.5–5.1)
POTASSIUM: 3.1 mmol/L — ABNORMAL LOW (ref 3.5–5.1)
POTASSIUM: 3.1 mmol/L — ABNORMAL LOW (ref 3.5–5.1)
POTASSIUM: 3.1 mmol/L — ABNORMAL LOW (ref 3.5–5.1)
POTASSIUM: 3.4 mmol/L — ABNORMAL LOW (ref 3.5–5.1)
SODIUM: 146 mmol/L — ABNORMAL HIGH (ref 136–145)
SODIUM: 148 mmol/L — ABNORMAL HIGH (ref 136–145)
SODIUM: 146 mmol/L — ABNORMAL HIGH (ref 136–145)
SODIUM: 146 mmol/L — ABNORMAL HIGH (ref 136–145)

## 2018-07-07 LAB — CBC WITH DIFF
BASOPHIL #: <0.1 10*3/uL (ref <=0.20)
BASOPHIL #: <0.1 x10?3/uL (ref <=0.20)
BASOPHIL %: 0 %
EOSINOPHIL #: <0.1 10*3/uL (ref <=0.50)
EOSINOPHIL %: 0 %
HCT: 28.1 % — ABNORMAL LOW (ref 34.8–46.0)
HGB: 9.4 g/dL — ABNORMAL LOW (ref 11.5–16.0)
HGB: 9.4 g/dL — ABNORMAL LOW (ref 11.5–16.0)
IMMATURE GRANULOCYTE #: 0.11 10*3/uL — ABNORMAL HIGH (ref <0.10)
IMMATURE GRANULOCYTE %: 1 % (ref 0–1)
LYMPHOCYTE #: 1.71 10*3/uL (ref 1.00–4.80)
LYMPHOCYTE #: 1.71 10*3/uL (ref 1.00–4.80)
LYMPHOCYTE %: 10 %
MCH: 29.8 pg (ref 26.0–32.0)
MCHC: 33.5 g/dL (ref 31.0–35.5)
MCV: 89.2 fL (ref 78.0–100.0)
MONOCYTE #: 1.49 10*3/uL — ABNORMAL HIGH (ref 0.20–1.10)
MONOCYTE %: 9 %
MPV: 10.9 fL (ref 8.7–12.5)
NEUTROPHIL #: 13.92 10*3/uL — ABNORMAL HIGH (ref 1.50–7.70)
NEUTROPHIL %: 80 %
PLATELETS: 141 10*3/uL — ABNORMAL LOW (ref 150–400)
RBC: 3.15 10*6/uL — ABNORMAL LOW (ref 3.85–5.22)
RDW-CV: 15.5 % (ref 11.5–15.5)
WBC: 17.3 10*3/uL — ABNORMAL HIGH (ref 3.7–11.0)

## 2018-07-07 LAB — CROSSMATCH RED CELLS - UNITS
UNIT DIVISION: 0
UNIT DIVISION: 0
UNIT DIVISION: 0
UNIT DIVISION: 0

## 2018-07-07 LAB — CBC
HCT: 30.2 % — ABNORMAL LOW (ref 34.8–46.0)
HGB: 10.1 g/dL — ABNORMAL LOW (ref 11.5–16.0)
MCH: 30.1 pg (ref 26.0–32.0)
MCHC: 33.4 g/dL (ref 31.0–35.5)
MCV: 89.9 fL (ref 78.0–100.0)
MCV: 89.9 fL (ref 78.0–100.0)
MPV: 10.7 fL (ref 8.7–12.5)
PLATELETS: 144 10*3/uL — ABNORMAL LOW (ref 150–400)
RBC: 3.36 10*6/uL — ABNORMAL LOW (ref 3.85–5.22)
RDW-CV: 15.5 % (ref 11.5–15.5)
WBC: 16.7 10*3/uL — ABNORMAL HIGH (ref 3.7–11.0)

## 2018-07-07 LAB — MAGNESIUM
MAGNESIUM: 2.3 mg/dL (ref 1.6–2.6)
MAGNESIUM: 2.3 mg/dL (ref 1.6–2.6)
MAGNESIUM: 2.1 mg/dL (ref 1.6–2.6)
MAGNESIUM: 2 mg/dL (ref 1.6–2.6)

## 2018-07-07 LAB — MRSA SCREEN, PCR, RAPID: MRSA COLONIZATION SCREEN: NEGATIVE

## 2018-07-07 LAB — URINALYSIS, MICROSCOPIC
HYALINE CASTS: 2 /LPF (ref <4.0)
RBCS: 2 /HPF (ref <6.0)
WBCS: 21 /HPF — ABNORMAL HIGH (ref <11.0)

## 2018-07-07 LAB — IONIZED CALCIUM WITH PH
IONIZED CALCIUM: 1.18 mmol/L (ref 1.10–1.35)
PH (VENOUS): 7.45 — ABNORMAL HIGH (ref 7.31–7.41)

## 2018-07-07 LAB — TYPE AND SCREEN
ABO/RH(D): O POS
ANTIBODY SCREEN: NEGATIVE
UNITS ORDERED: 2

## 2018-07-07 LAB — PHOSPHORUS
PHOSPHORUS: 1.9 mg/dL — ABNORMAL LOW (ref 2.4–4.7)
PHOSPHORUS: 2.3 mg/dL — ABNORMAL LOW (ref 2.4–4.7)
PHOSPHORUS: 2.1 mg/dL — ABNORMAL LOW (ref 2.4–4.7)
PHOSPHORUS: 2.8 mg/dL (ref 2.4–4.7)

## 2018-07-07 LAB — POC BLOOD GLUCOSE (RESULTS)
GLUCOSE, POC: 96 mg/dL (ref 70–105)
GLUCOSE, POC: 137 mg/dL — ABNORMAL HIGH (ref 70–105)
GLUCOSE, POC: 127 mg/dL — ABNORMAL HIGH (ref 70–105)

## 2018-07-07 LAB — LACTIC ACID LEVEL: LACTIC ACID: 1.5 mmol/L (ref 0.5–2.2)

## 2018-07-07 LAB — GRAY TOP TUBE

## 2018-07-07 MED ORDER — NICOTINE 14 MG/24 HR DAILY TRANSDERMAL PATCH
14.0000 mg | MEDICATED_PATCH | Freq: Every day | TRANSDERMAL | Status: DC
Start: 2018-07-07 — End: 2018-07-30
  Administered 2018-07-07 – 2018-07-12 (×6): 14 mg via TRANSDERMAL
  Administered 2018-07-13: 0 mg via TRANSDERMAL
  Administered 2018-07-13 – 2018-07-15 (×3): 14 mg via TRANSDERMAL
  Administered 2018-07-15: 0 mg via TRANSDERMAL
  Administered 2018-07-16 – 2018-07-30 (×15): 14 mg via TRANSDERMAL
  Filled 2018-07-07 (×25): qty 1

## 2018-07-07 MED ORDER — POLYETHYLENE GLYCOL 3350 17 GRAM ORAL POWDER PACKET
17.0000 g | Freq: Every day | ORAL | Status: DC
Start: 2018-07-07 — End: 2018-07-14
  Administered 2018-07-07 – 2018-07-08 (×2): 17 g via GASTROSTOMY
  Administered 2018-07-09 – 2018-07-10 (×2): 0 g via GASTROSTOMY
  Administered 2018-07-11 – 2018-07-12 (×2): 17 g via GASTROSTOMY
  Administered 2018-07-13: 0 g via GASTROSTOMY
  Filled 2018-07-07 (×4): qty 1

## 2018-07-07 MED ORDER — ENOXAPARIN 40 MG/0.4 ML SUBCUTANEOUS SYRINGE
40.0000 mg | INJECTION | SUBCUTANEOUS | Status: DC
Start: 2018-07-08 — End: 2018-07-08
  Filled 2018-07-07: qty 0.4

## 2018-07-07 MED ORDER — POTASSIUM BICARBONATE-CITRIC ACID 20 MEQ EFFERVESCENT TABLET
40.0000 meq | EFFERVESCENT_TABLET | ORAL | Status: AC
Start: 2018-07-07 — End: 2018-07-07
  Administered 2018-07-07: 40 meq via GASTROSTOMY
  Filled 2018-07-07: qty 2

## 2018-07-07 MED ORDER — SODIUM CHLORIDE 0.9 % INTRAVENOUS SOLUTION
30.0000 mmol | Freq: Once | INTRAVENOUS | Status: AC
Start: 2018-07-07 — End: 2018-07-07
  Administered 2018-07-07: 0 mmol via INTRAVENOUS
  Administered 2018-07-07: 30 mmol via INTRAVENOUS
  Filled 2018-07-07: qty 10

## 2018-07-07 MED ORDER — VANCOMYCIN 1 GRAM/200 ML IN DEXTROSE 5 % INTRAVENOUS PIGGYBACK
15.0000 mg/kg | INJECTION | Freq: Once | INTRAVENOUS | Status: DC
Start: 2018-07-07 — End: 2018-07-07

## 2018-07-07 MED ORDER — CEFEPIME 2 GRAM SOLUTION FOR INJECTION
2.0000 g | Freq: Three times a day (TID) | INTRAMUSCULAR | Status: AC
Start: 2018-07-08 — End: 2018-07-14
  Administered 2018-07-08 (×2): 0 g via INTRAVENOUS
  Administered 2018-07-08 (×2): 2 g via INTRAVENOUS
  Administered 2018-07-08 – 2018-07-09 (×2): 0 g via INTRAVENOUS
  Administered 2018-07-09: 2 g via INTRAVENOUS
  Administered 2018-07-09 (×2): 0 g via INTRAVENOUS
  Administered 2018-07-09 (×2): 2 g via INTRAVENOUS
  Administered 2018-07-10: 0 g via INTRAVENOUS
  Administered 2018-07-10: 2 g via INTRAVENOUS
  Administered 2018-07-10: 0 g via INTRAVENOUS
  Administered 2018-07-10 (×2): 2 g via INTRAVENOUS
  Administered 2018-07-10: 0 g via INTRAVENOUS
  Administered 2018-07-11 (×2): 2 g via INTRAVENOUS
  Administered 2018-07-11: 0 g via INTRAVENOUS
  Administered 2018-07-11: 2 g via INTRAVENOUS
  Administered 2018-07-11 – 2018-07-12 (×3): 0 g via INTRAVENOUS
  Administered 2018-07-12: 2 g via INTRAVENOUS
  Administered 2018-07-12 (×2): 0 g via INTRAVENOUS
  Administered 2018-07-12 – 2018-07-13 (×3): 2 g via INTRAVENOUS
  Administered 2018-07-13: 12:00:00 0 g via INTRAVENOUS
  Administered 2018-07-13: 2 g via INTRAVENOUS
  Administered 2018-07-13: 0 g via INTRAVENOUS
  Administered 2018-07-13: 2 g via INTRAVENOUS
  Administered 2018-07-13: 0 g via INTRAVENOUS
  Administered 2018-07-14: 2 g via INTRAVENOUS
  Administered 2018-07-14 (×2): 0 g via INTRAVENOUS
  Administered 2018-07-14: 2 g via INTRAVENOUS
  Administered 2018-07-14: 0 g via INTRAVENOUS
  Administered 2018-07-14: 2 g via INTRAVENOUS
  Filled 2018-07-07 (×21): qty 12.5

## 2018-07-07 MED ORDER — FAMOTIDINE 20 MG TABLET
20.0000 mg | ORAL_TABLET | Freq: Two times a day (BID) | ORAL | Status: DC
Start: 2018-07-07 — End: 2018-07-18
  Administered 2018-07-08 – 2018-07-17 (×20): 20 mg via GASTROSTOMY
  Filled 2018-07-07 (×23): qty 1

## 2018-07-07 MED ORDER — POTASSIUM BICARBONATE-CITRIC ACID 20 MEQ EFFERVESCENT TABLET
20.0000 meq | EFFERVESCENT_TABLET | ORAL | Status: AC
Start: 2018-07-07 — End: 2018-07-07
  Administered 2018-07-07: 20 meq via GASTROSTOMY
  Filled 2018-07-07: qty 1

## 2018-07-07 MED ORDER — INSULIN LISPRO 100 UNIT/ML SUB-Q SSIP
0.00 [IU] | INJECTION | SUBCUTANEOUS | Status: DC | PRN
Start: 2018-07-07 — End: 2018-07-19
  Administered 2018-07-08: 2 [IU] via SUBCUTANEOUS
  Administered 2018-07-09: 4 [IU] via SUBCUTANEOUS
  Administered 2018-07-10 – 2018-07-11 (×4): 2 [IU] via SUBCUTANEOUS
  Administered 2018-07-12: 4 [IU] via SUBCUTANEOUS
  Administered 2018-07-13 – 2018-07-18 (×3): 2 [IU] via SUBCUTANEOUS

## 2018-07-07 MED ORDER — POTASSIUM PHOSPHATES-MBASIC AND DIBASIC 3 MMOL/ML INTRAVENOUS SOLUTION
30.0000 mmol | Freq: Once | INTRAVENOUS | Status: AC
Start: 2018-07-07 — End: 2018-07-07
  Administered 2018-07-07: 0 mmol via INTRAVENOUS
  Administered 2018-07-07: 30 mmol via INTRAVENOUS
  Filled 2018-07-07: qty 10

## 2018-07-07 MED ORDER — CEFEPIME 2 GRAM SOLUTION FOR INJECTION
2.0000 g | INTRAMUSCULAR | Status: AC
Start: 2018-07-07 — End: 2018-07-07
  Administered 2018-07-07: 2 g via INTRAVENOUS
  Administered 2018-07-07: 0 g via INTRAVENOUS
  Filled 2018-07-07: qty 12.5

## 2018-07-07 MED ORDER — VANCOMYCIN IV - PHARMACIST TO DOSE PER PROTOCOL
Freq: Every day | Status: DC | PRN
Start: 2018-07-07 — End: 2018-07-07

## 2018-07-07 MED ORDER — POTASSIUM BICARBONATE-CITRIC ACID 20 MEQ EFFERVESCENT TABLET
40.0000 meq | EFFERVESCENT_TABLET | ORAL | Status: AC
Start: 2018-07-08 — End: 2018-07-08
  Administered 2018-07-08: 40 meq via GASTROSTOMY
  Filled 2018-07-07: qty 2

## 2018-07-07 MED ADMIN — Medication: INTRAVENOUS | @ 06:00:00

## 2018-07-07 MED ADMIN — fentaNYL (PF) 50 mcg/mL injection solution: INTRAVENOUS | @ 11:00:00

## 2018-07-07 MED ADMIN — polyethylene glycol 3350 17 gram oral powder packet: GASTROSTOMY | @ 08:00:00

## 2018-07-07 MED ADMIN — potassium bicarbonate-citric acid 20 mEq effervescent tablet: GASTROSTOMY | @ 14:00:00

## 2018-07-07 NOTE — Nurses Notes (Signed)
Restraint Continuation      Patient continues to have the following condition agitation, confusion.       Least restrictive alternatives attempted : Family called, Increase patient observation, concealed tubes/lines, encouraged relaxation techniques and redirected behavior    Patient continues to exhibit the following behaviors: agitated and kicking    The restraint continued to facilitate medical/surgical treatment to ensure safety.    The patient will continue to be evaluated and assessments documented on the flowsheet to ensure that the patient is released from the restraint at the earliest possible time

## 2018-07-07 NOTE — Nurses Notes (Signed)
Restraint Continuation      Patient continues to have the following condition AMS and Intubation.       Least restrictive alternatives attempted : Considered 1:1 sit, Increase patient observation, moved closer to the nurses station, bed-exit alarm/motion detector, call light accessible, concealed tubes/lines, decreased/removed stimulus, frequent orientation, provided familiar/personal items, reoriented to person, place, time, and treatment, encouraged relaxation techniques, play therapy, redirected behavior, used TV/music and assessed meds/medical problems    Patient continues to exhibit the following behaviors: uncooperative    The restraint continued to facilitate medical/surgical treatment to ensure safety.    The patient will continue to be evaluated and assessments documented on the flowsheet to ensure that the patient is released from the restraint at the earliest possible time

## 2018-07-07 NOTE — Nurses Notes (Deleted)
Restraint Continuation      Patient continues to have the following condition: confusion, agitation.       Least restrictive alternatives attempted : Family called, Increase patient observation, decreased/removed stimulus and encouraged relaxation techniques    Patient continues to exhibit the following behaviors: agitated and kicking    The restraint continued to facilitate medical/surgical treatment to ensure safety.    The patient will continue to be evaluated and assessments documented on the flowsheet to ensure that the patient is released from the restraint at the earliest possible time

## 2018-07-07 NOTE — Progress Notes (Signed)
[facility]  NEUROSURGERY   PROGRESS NOTE      [name], [name], 57 y.o. female  Date of Admission:  07/05/2018  Date of Service: 07/07/2018  Date of Birth:  September 19, 1960    Referring Physician:  No ref. provider found    Post Op Day: 2 Days Post-Op S/P Procedure(s) (LRB):  CRANIOTOMY (Left)    Chief Complaint: Left tentorial meningioma  Subjective: Lumbar drain removed, NAEO    Vital Signs:  Temp (24hrs) Max:38.5 C (270.7 F)      Systolic (86LJQ), GBE:010 , Min:104 , OFH:219     Diastolic (75OIT), GPQ:98, Min:58, Max:108    Temp  Avg: 37.9 C (100.3 F)  Min: 37.1 C (98.8 F)  Max: 38.5 C (101.3 F)  MAP (Non-Invasive)  Avg: 91.5 mmHG  Min: 72 mmHG  Max: 125 mmHG  Pulse  Avg: 105.5  Min: 87  Max: 127  Resp  Avg: 20.9  Min: 0  Max: 26  SpO2  Avg: 98.3 %  Min: 94 %  Max: 100 %  Pain Score (Numeric, Faces): 5    Min/Max/Avg ICP/CPP last 24hrs:   No data recorded    Today's Physical Exam:  Appears stated age, NAD  GCS 1/5/1T  Eyes closed  LOC LUE, WD RUE and BLE  PERRL  Face symmetric  +cough  Hearing grossly intact    Unable to assess speech  Unable to assess fund of knowledge  Unable to assess attention span & concentration  Unable to assess recent and remote memory  CN 3 4 6  EOMI Unable to assess  CN 11 shrug symmetric Unable to assess  Muscle Strength Unable to assess  Unable to assess drift   JP drain in place  Incision - staples    Current Medications:  acetaminophen (TYLENOL) tablet, 650 mg, Gastric (NG, OG, PEG, GT), Q4H PRN  docusate sodium (COLACE) 10mg  per mL oral liquid, 100 mg, Gastric (NG, OG, PEG, GT), Daily PRN  electrolyte-A (PLASMALYTE-A) premix infusion, , Intravenous, Continuous  fentaNYL (SUBLIMAZE) 50 mcg/mL injection, 25 mcg, Intravenous, Q1H PRN  hydrALAZINE (APRESOLINE) injection 10 mg, 10 mg, Intravenous, Q4H PRN  HYDROcodone-acetaminophen (NORCO) 5-325 mg per tablet, 1 Tab, Gastric (NG, OG, PEG, GT), Q4H PRN    Or  HYDROcodone-acetaminophen (NORCO) 5-325 mg per tablet, 2 Tab,  Gastric (NG, OG, PEG, GT), Q4H PRN  labetalol (TRANDATE) 5 mg/mL injection, 10 mg, Intravenous, Q1H PRN  levETIRAcetam (KEPPRA) 100 mg/mL injection, 500 mg, Intravenous, Q12H  metoprolol tartrate (LOPRESSOR) tablet, 25 mg, Gastric (NG, OG, PEG, GT), 2x/day  morphine 2 mg/mL injection, 2 mg, Intravenous, Q4H PRN  niCARdipine (CARDENE) 50 mg in NS 250 mL (tot vol) infusion, 5 mg/hr, Intravenous, Continuous  NS flush syringe, 2 mL, Intracatheter, Q8HRS    And  NS flush syringe, 2-6 mL, Intracatheter, Q1 MIN PRN  ondansetron (ZOFRAN) 2 mg/mL injection, 4 mg, Intravenous, Q6H PRN  scopolamine 1 mg over 3 days transdermal patch, 1 Patch, Transdermal, Q72H  senna concentrate (SENNA) 528mg  per 77mL oral liquid, 10 mL, Gastric (NG, OG, PEG, GT), 2x/day  SSIP insulin lispro (HUMALOG) 100 units/mL injection, 0-12 Units, Subcutaneous, Q4H PRN        I/O:  I/O last 24 hours:      Intake/Output Summary (Last 24 hours) at 07/07/2018 0339  Last data filed at 07/07/2018 0300  Gross per 24 hour   Intake 3615 ml   Output 2697.5 ml   Net 917.5 ml     I/O current  shift:  12/14 1900 - 12/15 0659  In: 1149 [I.V.:772; OG:377]  Out: 1010 [Urine:1010]    Antibiotics: Date Started Date Completed   1.     2.     3.     4.       Nutrition/Residuals:  DIET NPO - NOW  MNT PROTOCOL FOR DIETITIAN  ADULT TUBE FEED - BOLUS TID NO MEALS, TF ONLY; OSMOLITE 1.5; NG; BOLUS; Bolus Amount: 1 can three times a day    Labs  Please indicate ordered or reviewed)  Reviewed:   Lab Results Today:    Results for orders placed or performed during the hospital encounter of 07/05/18 (from the past 24 hour(s))   POC BLOOD GLUCOSE (RESULTS)   Result Value Ref Range    GLUCOSE, POC 167 (H) 70 - 105 mg/dl   POC BLOOD GLUCOSE (RESULTS)   Result Value Ref Range    GLUCOSE, POC 152 (H) 70 - 105 mg/dl   POC BLOOD GLUCOSE (RESULTS)   Result Value Ref Range    GLUCOSE, POC 146 (H) 70 - 105 mg/dl   POC BLOOD GLUCOSE (RESULTS)   Result Value Ref Range    GLUCOSE, POC 153 (H) 70  - 105 mg/dl   POC BLOOD GLUCOSE (RESULTS)   Result Value Ref Range    GLUCOSE, POC 96 70 - 105 mg/dl   CBC   Result Value Ref Range    WBC 16.7 (H) 3.7 - 11.0 x10^3/uL    RBC 3.36 (L) 3.85 - 5.22 x10^6/uL    HGB 10.1 (L) 11.5 - 16.0 g/dL    HCT 30.2 (L) 34.8 - 46.0 %    MCV 89.9 78.0 - 100.0 fL    MCH 30.1 26.0 - 32.0 pg    MCHC 33.4 31.0 - 35.5 g/dL    RDW-CV 15.5 11.5 - 15.5 %    PLATELETS 144 (L) 150 - 400 x10^3/uL    MPV 10.7 8.7 - 12.5 fL   MAGNESIUM   Result Value Ref Range    MAGNESIUM 2.3 1.6 - 2.6 mg/dL   PHOSPHORUS   Result Value Ref Range    PHOSPHORUS 1.9 (L) 2.4 - 4.7 mg/dL   BASIC METABOLIC PANEL - AM ONCE   Result Value Ref Range    SODIUM 146 (H) 136 - 145 mmol/L    POTASSIUM 3.1 (L) 3.5 - 5.1 mmol/L    CHLORIDE 114 (H) 96 - 111 mmol/L    CO2 TOTAL 24 22 - 32 mmol/L    ANION GAP 8 4 - 13 mmol/L    CALCIUM 8.4 (L) 8.5 - 10.2 mg/dL    GLUCOSE 142 (H) 65 - 139 mg/dL    BUN 17 8 - 25 mg/dL    CREATININE 1.01 0.49 - 1.10 mg/dL    BUN/CREA RATIO 17 6 - 22    ESTIMATED GFR >60 >60 mL/min/1.7m^2       Assessment/ Plan:  Active Hospital Problems    Diagnosis   . 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 female s/p left temporal craniotomy for left tentorial meningioma resection s/p left craniectomy for evacuation of tumor bed hematoma. POD1/1  --Keppra 500 mg bid x7 days  -- Imaging:    MRI brain w/wo (07/06/18): DONE   CT brain w/o (07/06/18): Postoperative changes from left decompressive craniectomy for evacuation of hematoma of tumor bed with residual hemorrhage and improved edema and rightward midline  shift.   CT brain w/o (07/06/18): Postoperative changes from decompressive craniectomy for evacuation of tumor bed hematoma with minimal residual blood at the surgical site.  -- Pain/spasm control: Tylenol PRN, Norco PRN  -- Diet: NPO+mIVF+Osmolyte TF  -- Bowel regimen, last BM PTA  -- Abx: none  -- Activity: ambulate as  tolerated w/ assist  -- PT/OT: ordered   -- DVT ppx: SCDs/Venodynes  -- Consults:    none  -- Lines/Drains: Foley, JP, OG, ETT  -- Wound: cranial staples  -- Disposition: ongoing    Keane Police, M.D.  PGY-2 Neurosurgery  07/07/2018, 03:45

## 2018-07-07 NOTE — Nurses Notes (Signed)
.  Restraint Continuation      Patient continues to be withdrawn with intermitant agitation and attempting to remove lines/ETT.       Least restrictive alternatives attempted : Considered 1:1 sit, Family called, Increase patient observation, moved closer to the nurses station, bed-exit alarm/motion detector, call light accessible, concealed tubes/lines, decreased/removed stimulus, frequent orientation, involved patient in conversation, provided familiar/personal items, reoriented to person, place, time, and treatment, signage, encouraged relaxation techniques, offered food/fluids, play therapy, redirected behavior, used TV/music and assessed meds/medical problems    Patient continues to exhibit the following behaviors: withdrawn    The restraint continued to facilitate medical/surgical treatment to ensure safety.    The patient will continue to be evaluated and assessments documented on the flowsheet to ensure that the patient is released from the restraint at the earliest possible time

## 2018-07-07 NOTE — Procedures (Signed)
Neurosurgery  07/07/2018  03:47    Etheline Geppert  [id number]  07-Jun-1961, 57 y.o., female    Lumbar drain was removed today at bedside. Drain site closed with 3-0 Vicryl rapide figure of eight stitch. No immediate complications.      Keane Police, M.D.  PGY-2 Neurosurgery  07/07/2018, 03:47

## 2018-07-07 NOTE — Care Plan (Signed)
VENTILATOR - CPAP(PS) / SPONTANEOUS CONTINUOUS Discontinue   Duration: Until Specified    Priority: Routine       Question Answer Comment   FIO2 (%) 30    Peep(cm/H2O) 5    Pressure Support(cm/H2O) 8    Indications IMPROVE DISTRIBUTION OF VENTILATION    Invasive/Non-Invasive INVASIVE            Patient on current vent settings.  Continue to wean as tolerated.

## 2018-07-07 NOTE — Anesthesia Postprocedure Evaluation (Signed)
Anesthesia Post Op Evaluation    Patient: [name]  Procedure(s):  CRANIOTOMY    Last Vitals:Temperature: (!) 38.3 C (100.9 F) (07/07/18 0400)  Heart Rate: (!) 105 (07/07/18 0300)  BP (Non-Invasive): 116/76 (07/07/18 0300)  Respiratory Rate: 18 (07/07/18 0300)  SpO2: 98 % (07/07/18 0315)      Patient location during evaluation: ICU       Patient participation: complete - patient cannot participate  Level of consciousness: sedated  Pain management: adequate  Airway patency: intubated  Anesthetic complications: no  Cardiovascular status: acceptable  Respiratory status: acceptable    PONV Status: Absent    Patient back to OR for stat craniotomy hematoma evacuation, returned to ICU stable in condition from preoperative status.

## 2018-07-07 NOTE — Anesthesia Postprocedure Evaluation (Signed)
Anesthesia Post Op Evaluation    Patient: [name]  Procedure(s) with comments:  CRANIOTOMY FOR TUMOR WITH NAVIGATION  CT SCAN - @ 8:30    Last Vitals:Temperature: (!) 38.3 C (100.9 F) (07/07/18 0400)  Heart Rate: (!) 106 (07/07/18 0400)  BP (Non-Invasive): (!) 131/91 (07/07/18 0400)  Respiratory Rate: (!) 21 (07/07/18 0400)  SpO2: 97 % (07/07/18 0400)      Patient location during evaluation: bedside   Post-procedure handoff checklist completed    Patient participation: waiting for patient participation  Level of consciousness: sedated  Pain management: adequate  Airway patency: intubated  Anesthetic complications: no  Cardiovascular status: acceptable  Respiratory status: ETT  Hydration status: acceptable  Patient post-procedure temperature: Pt Normothermic     Comments: Pt taken from OR to CT scanner. Returning back to OR for emergent crani. PT hemodynamically stable, remains intubated. Report was given to NCCU team as well as charge Anesthesiologist.

## 2018-07-07 NOTE — Nurses Notes (Signed)
Restraint Continuation      Patient continues to have the following condition AMS and intubation.       Least restrictive alternatives attempted : Considered 1:1 sit, Increase patient observation, moved closer to the nurses station, bed-exit alarm/motion detector, call light accessible, concealed tubes/lines, decreased/removed stimulus, frequent orientation, involved patient in conversation, reoriented to person, place, time, and treatment, encouraged relaxation techniques, play therapy, redirected behavior, used TV/music and assessed meds/medical problems    Patient continues to exhibit the following behaviors: uncooperative    The restraint continued to facilitate medical/surgical treatment to ensure safety.    The patient will continue to be evaluated and assessments documented on the flowsheet to ensure that the patient is released from the restraint at the earliest possible time

## 2018-07-07 NOTE — OR Surgeon (Signed)
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    NAME OF PROCEDURES:  1. Left frontotemporal craniotomy and subtemporal approach for excision of the meningioma using the Vycor tube system.  2. Use of Stealth neuronavigation.   3. Placement of lumbar drain.    SURGEON:  [name], MD.    ASSISTANTHipolito Bayley, MD PhD.    ANESTHESIA:  General.    POSITION:  Lateral left side up.    ESTIMATED BLOOD LOSS:  Approximately 300-400 mL.    COMPLICATIONS:  Loss of SSEP on the right side.    INDICATIONS FOR PROCEDURE:  The patient is a 58 year old lady who had been noticing gradually progressing right upper and lower extremity weakness over the past 3-4 years.  The patient states that the weakness has been progressively getting worse and recently she has been falling because of her right leg giving out.  She was seen by our neurologist, who noticed that she had upper motor neuron signs on the right side.  An MRI of the brain and the cervical spine were ordered.  The MRI brain showed a 4 x 2.5 x 4 cm left tentorial notch meningioma which was extending supratentorially into the basal ganglia and distorting the brainstem.  She was advised surgery for this mass.  A CT scan also showed that this was a somewhat calcified mass consistent with a meningioma.  The patient also had a 4 to 5 mm focus of enhancement in the intraorbital segment of the right optic nerve which could also represent a small meningioma there.  Because of this large lesion and the right-sided progressive weakness, the patient was offered surgery for this tumor.  The risks, benefits, indications, and procedures were discussed including the risks of hemiplegia, venous stroke, and consent was obtained.    DESCRIPTION OF PROCEDURE:  The patient was  brought to the operating room by the anesthesia team.  She underwent a successful induction intubation.  She was then positioned lateral on the operating table with the left side up.  Her back was first prepped and draped in the usual sterile fashion and a lumbar drain was placed.  Once the lumbar drain was secured, we then turned our attention to the head.  We placed her head in a [place] 3-point pin system.  All pressure points were padded.  A CT scan BrainLAB protocol having been obtained was registered on the BrainLAB machine and fused with the preexisting MRI scan.  Based on this registration we then outlying the approach for the tumor, which we wanted to be a left subtemporal anterior approach.  We planned a reverse question mark incision starting in front of the tragus over the zygomatic arch and then going back behind the ear to the posterior extent of the tumor and then curving forward staying behind the hairline towards the mid pupillary line.  A small strip of hair was shaved from the site of the incision.  Parts were then prepped and draped in the usual sterile fashion.  A surgical pause was carried out and 10 mL of CSF was allowed to drain from the lumbar drain.  Incision was then sharply opened with a #10 skin blade.  Dissection was carried down to the temporal fascia and the pericranium.  Monopolar cautery was  then used to divide the temporal fascia and the muscle in line with the incision.  The pericranium was also divided.  The temporal muscle and the pericranium were then elevated anteriorly and held away with cerebellar retractors.  Using the navigation system, we then planned a craniotomy flush with the temporal base starting close to the temporal tip and extending back to behind the level of the external auditory meatus.  Three bur holes were then placed.  The footplate craniotome was used to join the bur holes.  We were able to get the drill across the lesser wing of the sphenoid by tilting it  inferiorly and superiorly.  The bone flap was then removed.  The remainder of the lesser wing of the sphenoid was drilled away.  We also drilled approximately 4 to 5 mm of the of the temporal squamous bone along the inferior margin of the craniotomy to allow this to be flush with the temporal floor.  Once this was done, the dura was opened as the flap was placed inferiorly.  This was also divided in the middle and the 2 flaps were then held apart with 4-0 Nurolon sutures.  Another 10 mL of CSF was allowed to drain from the lumbar drain as the brain appeared to be somewhat full.  We also opened partially the sylvian fissure and allowed some CSF to escape from this also.  Finally, the brain was lax enough to be able to work our way  below the temporal lobe gradually while removing it superiorly and working our way to the base of this tumor.  We gradually dissected and reached the base of the tumor and we uncovered approximately 2 cm of this tumor lifting the temporal lobe of about 2 cm.  We then placed a 17 mm x 5 mm Vycor tube directly onto the tumor with retraction of the temporal lobe superiorly using the suction and then placing the tube directly on the base of the tumor.  Once this was done, the microscope was brought into the field.  Under the microscope, we gradually coagulated and identified the anterior margin of the tumor and the base of the tumor and the posterior aspect of the tentorial attachment of the tumor on the lateral aspect.  Once this dissection was completed, we then coagulated the surface of the tumor which was covered with several medium and large veins and then opened the capsule of the tumor and started debulking the tumor internally.  The tumor was fairly vascular.  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  We gradually debulked the tumor and then as we gained more room, we worked around the base of the tumor, separating it from the tentorium and working  our way anteriorly, posteriorly, and medially.  In this fashion we were able to debulk the tumor as well as separate it from the tentorium, leaving only the medial margin in place.  We then moved the tube slightly superiorly, angling it superiorly, and then working in an arachnoid plane identified the anterior margin of the tumor and then gradually started debulking on the inside and working our way in the arachnoid plane more and more superiorly by angling this tube superiorly.  In this fashion we gradually resected more and more of the tumor.  Once the tumor was well separated from the tentorium, there was not a lot of vascularity.  For the most part, the tumor had a good arachnoid plane.  A very few small vessels  were seen entering the tumor.  The posterior cerebral artery was on the superior-medial side of the tumor.  This was preserved and separated away from the tumor.  As we gradually debulked the tumor and move the tumor down  laterally, we were able to debulk and work within the arachnoid plane. We used Sonopet at a low setting of Power 30 to gradually resect the tumor internally. Because of this difficult location and the difficult area, the surgery took approximately 9 hours.  As mentioned earlier, we took a very few small vessels which were entering directly into the tumor.  A small amount of tumor was left attached to the tentorial edge, specially a part of the tumor which was projecting inferior to the tentorial edge.  This was coagulated but not removed.  The superior petrosal sinus at the edge was also entered at the posterior edge of the tumor.  This was coagulated and finally hemostasis was secured in this area with some Floseal and pressure with the Surgicel.  As we removed the tumor from the brainstem in the posterior, superior, and medial aspect, there was also some venous bleeding which was also controlled with Surgicel and Floseal.  Thorough irrigation was then carried out.  The tumor was  almost completely removed except for a small portion along the tentorium projecting inferior to the tentorium.  As mentioned before, this was thoroughly coagulated.  The tumor cavity was thoroughly irrigated.  The dura was folded back down and closed with interrupted 4-0 Nurolon sutures.  The dura was also covered with a sheet of Gelfoam.  A central tack-up suture was applied.  The bone flap was replaced held in place with a Stryker cranioplasty set and 5 mL of HydroSet was mixed and was applied to the edges of the craniotomy.  A #10 round JP drain was placed in the subgaleal space.  The incision was thoroughly irrigated.  Vancomycin powder was sprinkled into the incision.  The incision was then closed in layers using 2-0 Vicryl for the temporal muscle, 2-0 Vicryl for the temporal fascia, and staples for the skin.  The patient underwent SSEP and motor potential monitoring throughout the case.  Please note that early on in the case as the tumor was being debulked and manipulated, the patient required an increase in the stimulating orders from 470 V to 570 V for motor evoked potentials on the right side.  This remained stable for the remainder of the case and even at the end she had good responses at 570 V.  However, as we were resecting the inferior portion of the tumor near the tentorial edge, the patient had loss of SSEP on the right side.  These were barely discernible and did not return back to normal as we finished the closure.  At the conclusion of the procedure, the patient's head was removed from the [place] 3-point pin system. Once again, no problems were encountered other than the loss of SSEP on the right side, which happened towadrs the end of the tumor resection, as the last piece of the tumor was being removed off the brainstem.        [name], MD  Associate Professor   [place] [facility]              DD:  07/05/2018 21:29:43  DT:  07/07/2018 05:46:56 DW  D#:  [id number]

## 2018-07-07 NOTE — Progress Notes (Signed)
[facility]  NEUROSURGERY   PROGRESS NOTE      [name], [name], 57 y.o. female  Date of Admission:  07/05/2018  Date of Service: 07/08/2018  Date of Birth:  26-Apr-1961    Referring Physician:  No ref. provider found    Post Op Day: 3 Days Post-Op S/P Procedure(s) (LRB):  CRANIOTOMY (Left)    Chief Complaint: Left tentorial meningioma  Subjective: Febrile yesterday.  Started on empiric antibiotics and cultures sent.    Vital Signs:  Temp (24hrs) Max:38.7 C (824.2 F)      Systolic (35TIR), WER:154 , Min:98 , MGQ:676     Diastolic (19JKD), TOI:71, Min:62, Max:102    Temp  Avg: 38.3 C (101 F)  Min: 37.7 C (99.9 F)  Max: 38.7 C (101.7 F)  MAP (Non-Invasive)  Avg: 86.8 mmHG  Min: 73 mmHG  Max: 115 mmHG  Pulse  Avg: 105.5  Min: 91  Max: 136  Resp  Avg: 21.3  Min: 15  Max: 29  SpO2  Avg: 98.3 %  Min: 94 %  Max: 100 %  Pain Score (Numeric, Faces): Other(CPOT)    Min/Max/Avg ICP/CPP last 24hrs:   No data recorded    Today's Physical Exam:  Appears stated age, NAD  GCS 1/5/1T  Eyes closed  LOC LUE, WD RUE and BLE  PERRL  Face symmetric  +cough  Hearing grossly intact    Unable to assess speech  Unable to assess fund of knowledge  Unable to assess attention span & concentration  Unable to assess recent and remote memory  CN 3 4 6  EOMI Unable to assess  CN 11 shrug symmetric Unable to assess  Muscle Strength Unable to assess  Unable to assess drift   -- Lumbar drain stitch in place (rapide)  -- JP drain stitch in place (rapide)  -- Incision C/D/I w/ staples    Current Medications:  acetaminophen (TYLENOL) tablet, 650 mg, Gastric (NG, OG, PEG, GT), Q4H PRN  cefepime (MAXIPIME) 2 g in NS 100 mL IVPB, 2 g, Intravenous, Q8H  enoxaparin PF (LOVENOX) 40 mg/0.4 mL SubQ injection, 40 mg, Subcutaneous, Q24H  famotidine (PEPCID) tablet, 20 mg, Gastric (NG, OG, PEG, GT), 2x/day  fentaNYL (SUBLIMAZE) 50 mcg/mL injection, 25 mcg, Intravenous, Q1H PRN  hydrALAZINE (APRESOLINE) injection 10 mg, 10 mg, Intravenous, Q4H  PRN  HYDROcodone-acetaminophen (NORCO) 5-325 mg per tablet, 1 Tab, Gastric (NG, OG, PEG, GT), Q4H PRN    Or  HYDROcodone-acetaminophen (NORCO) 5-325 mg per tablet, 2 Tab, Gastric (NG, OG, PEG, GT), Q4H PRN  labetalol (TRANDATE) 5 mg/mL injection, 10 mg, Intravenous, Q1H PRN  levETIRAcetam (KEPPRA) 100 mg/mL injection, 500 mg, Intravenous, Q12H  lisinopril (PRINIVIL) tablet, 40 mg, Gastric (NG, OG, PEG, GT), Daily  metoprolol tartrate (LOPRESSOR) tablet, 25 mg, Gastric (NG, OG, PEG, GT), 2x/day  morphine 2 mg/mL injection, 2 mg, Intravenous, Q4H PRN  nicotine (NICODERM CQ) transdermal patch (mg/24 hr), 14 mg, Transdermal, Daily  NS flush syringe, 2 mL, Intracatheter, Q8HRS    And  NS flush syringe, 2-6 mL, Intracatheter, Q1 MIN PRN  ondansetron (ZOFRAN) 2 mg/mL injection, 4 mg, Intravenous, Q6H PRN  polyethylene glycol (MIRALAX) oral packet, 17 g, Gastric (NG, OG, PEG, GT), Daily  senna concentrate (SENNA) 528mg  per 41mL oral liquid, 10 mL, Gastric (NG, OG, PEG, GT), 2x/day  SSIP insulin lispro (HUMALOG) 100 units/mL injection, 0-12 Units, Subcutaneous, Q4H PRN        I/O:  I/O last 24 hours:  Intake/Output Summary (Last 24 hours) at 07/08/2018 0058  Last data filed at 07/08/2018 0000  Gross per 24 hour   Intake 2256 ml   Output 2320 ml   Net -64 ml     I/O current shift:  12/15 1900 - 12/16 0659  In: 557 [I.V.:20; ML:465]  Out: 465 [Urine:465]    Nutrition/Residuals:  MNT PROTOCOL FOR DIETITIAN  DIET NPO - NOW EXCEPT TUBE FEEDS  ADULT TUBE FEED - BOLUS TID NO MEALS, TF ONLY; OSMOLITE 1.5; NG; BOLUS; Bolus Amount: 1 can three times a day; Flush Tube with water (select amt): 200 ml; Flush Frequency: Q6H    Labs  Please indicate ordered or reviewed)  Reviewed:   Lab Results Today:    Results for orders placed or performed during the hospital encounter of 07/05/18 (from the past 24 hour(s))   CBC   Result Value Ref Range    WBC 16.7 (H) 3.7 - 11.0 x10^3/uL    RBC 3.36 (L) 3.85 - 5.22 x10^6/uL    HGB 10.1 (L) 11.5 -  16.0 g/dL    HCT 30.2 (L) 34.8 - 46.0 %    MCV 89.9 78.0 - 100.0 fL    MCH 30.1 26.0 - 32.0 pg    MCHC 33.4 31.0 - 35.5 g/dL    RDW-CV 15.5 11.5 - 15.5 %    PLATELETS 144 (L) 150 - 400 x10^3/uL    MPV 10.7 8.7 - 12.5 fL   MAGNESIUM   Result Value Ref Range    MAGNESIUM 2.3 1.6 - 2.6 mg/dL   PHOSPHORUS   Result Value Ref Range    PHOSPHORUS 1.9 (L) 2.4 - 4.7 mg/dL   BASIC METABOLIC PANEL - AM ONCE   Result Value Ref Range    SODIUM 146 (H) 136 - 145 mmol/L    POTASSIUM 3.1 (L) 3.5 - 5.1 mmol/L    CHLORIDE 114 (H) 96 - 111 mmol/L    CO2 TOTAL 24 22 - 32 mmol/L    ANION GAP 8 4 - 13 mmol/L    CALCIUM 8.4 (L) 8.5 - 10.2 mg/dL    GLUCOSE 142 (H) 65 - 139 mg/dL    BUN 17 8 - 25 mg/dL    CREATININE 1.01 0.49 - 1.10 mg/dL    BUN/CREA RATIO 17 6 - 22    ESTIMATED GFR >60 >60 mL/min/1.25m^2   MRSA SCREEN, PCR, RAPID   Result Value Ref Range    MRSA COLONIZATION SCREEN Negative Negative   URINALYSIS, MACROSCOPIC   Result Value Ref Range    SPECIFIC GRAVITY 1.014 1.005 - 1.030    GLUCOSE 50  (A) Negative mg/dL    PROTEIN Negative Negative mg/dL    BILIRUBIN Negative Negative mg/dL    UROBILINOGEN Negative Negative mg/dL    PH 5.0 5.0 - 8.0    BLOOD Small (A) Negative mg/dL    KETONES Negative Negative mg/dL    NITRITE Negative Negative    LEUKOCYTES Negative Negative WBCs/uL    APPEARANCE Cloudy (A) Clear    COLOR Normal (Yellow) Normal (Yellow)   URINALYSIS, MICROSCOPIC   Result Value Ref Range    WBCS 21.0 (H) <11.0 /hpf    RBCS 2.0 <6.0 /hpf    BACTERIA Occasional or less Occasional or less /hpf    HYALINE CASTS 2.0 <4.0 /lpf    MUCOUS Moderate (A) Light /lpf   POC BLOOD GLUCOSE (RESULTS)   Result Value Ref Range    GLUCOSE, POC 137 (H) 70 - 105  mg/dl   CBC WITH DIFF   Result Value Ref Range    WBC 17.3 (H) 3.7 - 11.0 x10^3/uL    RBC 3.15 (L) 3.85 - 5.22 x10^6/uL    HGB 9.4 (L) 11.5 - 16.0 g/dL    HCT 28.1 (L) 34.8 - 46.0 %    MCV 89.2 78.0 - 100.0 fL    MCH 29.8 26.0 - 32.0 pg    MCHC 33.5 31.0 - 35.5 g/dL    RDW-CV 15.5  11.5 - 15.5 %    PLATELETS 141 (L) 150 - 400 x10^3/uL    MPV 10.9 8.7 - 12.5 fL    NEUTROPHIL % 80 %    LYMPHOCYTE % 10 %    MONOCYTE % 9 %    EOSINOPHIL % 0 %    BASOPHIL % 0 %    NEUTROPHIL # 13.92 (H) 1.50 - 7.70 x10^3/uL    LYMPHOCYTE # 1.71 1.00 - 4.80 x10^3/uL    MONOCYTE # 1.49 (H) 0.20 - 1.10 x10^3/uL    EOSINOPHIL # <0.10 <=0.50 x10^3/uL    BASOPHIL # <0.10 <=0.20 x10^3/uL    IMMATURE GRANULOCYTE % 1 0 - 1 %    IMMATURE GRANULOCYTE # 0.11 (H) <0.10 x10^3/uL   GRAY TOP TUBE   Result Value Ref Range    RAINBOW/EXTRA TUBE AUTO RESULT Yes    LACTIC ACID LEVEL   Result Value Ref Range    LACTIC ACID 1.5 0.5 - 2.2 mmol/L   BASIC METABOLIC PANEL   Result Value Ref Range    SODIUM 148 (H) 136 - 145 mmol/L    POTASSIUM 3.1 (L) 3.5 - 5.1 mmol/L    CHLORIDE 116 (H) 96 - 111 mmol/L    CO2 TOTAL 25 22 - 32 mmol/L    ANION GAP 7 4 - 13 mmol/L    CALCIUM 8.2 (L) 8.5 - 10.2 mg/dL    GLUCOSE 127 65 - 139 mg/dL    BUN 14 8 - 25 mg/dL    CREATININE 0.82 0.49 - 1.10 mg/dL    BUN/CREA RATIO 17 6 - 22    ESTIMATED GFR >60 >60 mL/min/1.47m^2   PHOSPHORUS   Result Value Ref Range    PHOSPHORUS 2.3 (L) 2.4 - 4.7 mg/dL   MAGNESIUM   Result Value Ref Range    MAGNESIUM 2.3 1.6 - 2.6 mg/dL   POC BLOOD GLUCOSE (RESULTS)   Result Value Ref Range    GLUCOSE, POC 127 (H) 70 - 105 mg/dl   IONIZED CALCIUM   Result Value Ref Range    PH (VENOUS) 7.45 (H) 7.31 - 7.41    IONIZED CALCIUM 1.18 1.10 - 1.35 mmol/L   BASIC METABOLIC PANEL   Result Value Ref Range    SODIUM 146 (H) 136 - 145 mmol/L    POTASSIUM 3.1 (L) 3.5 - 5.1 mmol/L    CHLORIDE 115 (H) 96 - 111 mmol/L    CO2 TOTAL 27 22 - 32 mmol/L    ANION GAP 4 4 - 13 mmol/L    CALCIUM 8.1 (L) 8.5 - 10.2 mg/dL    GLUCOSE 153 (H) 65 - 139 mg/dL    BUN 14 8 - 25 mg/dL    CREATININE 0.78 0.49 - 1.10 mg/dL    BUN/CREA RATIO 18 6 - 22    ESTIMATED GFR >60 >60 mL/min/1.12m^2   PHOSPHORUS   Result Value Ref Range    PHOSPHORUS 2.1 (L) 2.4 - 4.7 mg/dL   MAGNESIUM   Result Value  Ref Range    MAGNESIUM  2.1 1.6 - 2.6 mg/dL   BASIC METABOLIC PANEL   Result Value Ref Range    SODIUM 146 (H) 136 - 145 mmol/L    POTASSIUM 3.4 (L) 3.5 - 5.1 mmol/L    CHLORIDE 114 (H) 96 - 111 mmol/L    CO2 TOTAL 24 22 - 32 mmol/L    ANION GAP 8 4 - 13 mmol/L    CALCIUM 7.9 (L) 8.5 - 10.2 mg/dL    GLUCOSE 135 65 - 139 mg/dL    BUN 12 8 - 25 mg/dL    CREATININE 0.69 0.49 - 1.10 mg/dL    BUN/CREA RATIO 17 6 - 22    ESTIMATED GFR >60 >60 mL/min/1.44m^2   PHOSPHORUS   Result Value Ref Range    PHOSPHORUS 2.8 2.4 - 4.7 mg/dL   MAGNESIUM   Result Value Ref Range    MAGNESIUM 2.0 1.6 - 2.6 mg/dL       Assessment/ Plan:  Active Hospital Problems    Diagnosis   . 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 female s/p left temporal craniotomy for left tentorial meningioma resection s/p left craniectomy for evacuation of tumor bed hematoma. POD3/2.  -- Keppra 500 mg bid x7 days  -- Please bathe patient daily and clean wound daily   -- Okay for soap/shampoo   -- Apply bacitracin ointment BID, cleaning old ointment off before application of new ointment    -- Imaging:    CT brain w/o (07/08/18): ORDERED   MRI brain w/wo (07/06/18): Acute postoperative changes related to gross total resection of a left medial tentorial meningioma with previously seen mass effect on the left thalamus, left middle cerebral peduncle and left midbrain.  Hemorrhagic products are identified within the resection cavity extending into the parenchyma of the left thalamus and tracking down within the left midbrain and extending to the fourth ventricle.   CT brain w/o (07/06/18): Postoperative changes from left decompressive craniectomy for evacuation of hematoma of tumor bed with residual hemorrhage and improved edema and rightward midline shift.   CT brain w/o (07/06/18): Postoperative changes from decompressive craniectomy for evacuation of tumor bed hematoma with minimal residual blood at the  surgical site.  -- Pain/spasm control: Tylenol PRN, Norco PRN  -- Diet: NPO + Osmolite TF  -- Bowel regimen, last BM 07/07/18  -- Abx: Cefepime (empiric)   -- Blood culture (07/07/18): NGTD   -- Urine culture (07/07/18): NGTD  -- Activity: ambulate as tolerated w/ assist  -- PT/OT: ordered   -- DVT ppx: SCDs/Venodynes & Lovenox  -- Consults: None  -- Lines/Drains: Foley, OG, ETT  -- Wound: cranial staples  -- Disposition: ongoing    Reola Calkins, MD, PhD  Department of Neurosurgery  PGY5      I saw and examined the patient.  I reviewed the resident's note.  I agree with the findings and plan of care as documented in the resident's note.  Any exceptions/additions are edited/noted.    [name], MD

## 2018-07-07 NOTE — Procedures (Signed)
Neurosurgery  07/07/2018  09:31    [name]  [id number]  [birth year], 59 y.o., female    JP drain was removed today at bedside. Site closed with 3-0 Vicryl Rapide figure of eight stitch. No immediate complications.     Earley Favor, MD  PGY-1 Anesthesiology  [phone]

## 2018-07-07 NOTE — Care Plan (Signed)
Patient making progress towards D/C goals. Tachycardic and hypertensive at times but otherwise VSS and assessment per flow sheets. GCS 1-5-1. Localizes in her LUE, extends her RUE and withdraws her lower extremities. Patient is intubated and unable to verbalize at this time. Patient has not opened eyes at this time. Foley remains in place draining adequate amounts of clear yellow urine. Mepliex remains in place on sacrum. Antibiotics continued. Tube feed tolerated well. OOB with the maxi move with helmet on.  Art line removed overnight and MRI completed. Potassium and phosphorus replacements ordered per NCCU.     Problem: Adult Inpatient Plan of Care  Goal: Plan of Care Review  Outcome: Ongoing (see interventions/notes)  Flowsheets (Taken 07/07/2018 0515)  Plan of Care Reviewed With: patient  Goal: Patient-Specific Goal (Individualization)  Outcome: Ongoing (see interventions/notes)  Flowsheets (Taken 07/07/2018 0515)  Individualized Care Needs: family wants nicotine replacement for patient  Patient-Specific Goals (Include Timeframe): Family wants patient to open eyes.  Goal: Absence of Hospital-Acquired Illness or Injury  Outcome: Ongoing (see interventions/notes)  Goal: Optimal Comfort and Wellbeing  Outcome: Ongoing (see interventions/notes)  Goal: Rounds/Family Conference  Outcome: Ongoing (see interventions/notes)  Flowsheets (Taken 07/07/2018 0515)  Participants: family; advanced practice nurse; nursing; patient; pharmacy; physician; physician assistant     Problem: Communication Impairment (Mechanical Ventilation, Invasive)  Goal: Effective Communication  Outcome: Ongoing (see interventions/notes)     Problem: Device-Related Complication Risk (Mechanical Ventilation, Invasive)  Goal: Optimal Device Function  Outcome: Ongoing (see interventions/notes)     Problem: Inability to Wean (Mechanical Ventilation, Invasive)  Goal: Mechanical Ventilation Liberation  Outcome: Ongoing (see interventions/notes)        Problem: Nutrition Impairment (Mechanical Ventilation, Invasive)  Goal: Optimal Nutrition Delivery  Outcome: Ongoing (see interventions/notes)     Problem: Skin and Tissue Injury (Mechanical Ventilation, Invasive)  Goal: Absence of Device-Related Skin and Tissue Injury  Outcome: Ongoing (see interventions/notes)     Problem: Ventilator-Induced Lung Injury (Mechanical Ventilation, Invasive)  Goal: Absence of Ventilator-Induced Lung Injury  Outcome: Ongoing (see interventions/notes)     Problem: Fall Injury Risk  Goal: Absence of Fall and Fall-Related Injury  Outcome: Ongoing (see interventions/notes)     Problem: Skin Injury Risk Increased  Goal: Skin Health and Integrity  Outcome: Ongoing (see interventions/notes)     Problem: Non-violent/Non-Self Destructive Restraints  Goal: Alternative methods tried prior to restraints  Outcome: Ongoing (see interventions/notes)  Goal: Patient free from injury and discomfort  Outcome: Ongoing (see interventions/notes)  Goal: Autonomy maintained at the highest possible level  Outcome: Ongoing (see interventions/notes)  Goal: Need for restraints reassessed per policy  Outcome: Ongoing (see interventions/notes)  Goal: Patient education provided  Outcome: Ongoing (see interventions/notes)  Goal: Problem Interventions  Outcome: Ongoing (see interventions/notes)     Problem: Hypertension Comorbidity  Goal: Blood Pressure in Desired Range  Outcome: Ongoing (see interventions/notes)     Problem: Depression  Goal: Improved Mood  Outcome: Ongoing (see interventions/notes)      Patient Goals Based on Modifiable Risk Factors    The following modifiable risk factors were discussed with patient:     Cigarette Smoking    Goal(s):  Complete Cessation    Patient's selected lifestyle modification(s):  Patient nonverbal but nicotine replacement ordered per family request.      Patient/Family/Caregiver response to plan:    Family stated "please give her a nicotine patch".

## 2018-07-07 NOTE — Progress Notes (Signed)
[facility]  Neurocritical Care (NCCU) Progress Note    [name], [name]  Date of Admission:  07/05/2018  Date of Service: 07/07/2018  Date of Birth:  05-08-1961    Primary Attending: [name], MD   Primary Service: NEUROSURGERY 1 [place]     Chief Complaint:  No chief complaint on file.     Subjective: Febrile overnight, pancultured. Potassium and phosphate replaced. MRI brain completed.     Vital Signs:  Temp (24hrs) Max:38.5 C (902.4 F)      Systolic (09BDZ), HGD:924 , Min:104 , QAS:341     Diastolic (96QIW), LNL:89, Min:58, Max:108    Temp  Avg: 38.1 C (100.5 F)  Min: 37.2 C (99 F)  Max: 38.5 C (101.3 F)  MAP (Non-Invasive)  Avg: 92.9 mmHG  Min: 72 mmHG  Max: 125 mmHG  Pulse  Avg: 105.6  Min: 87  Max: 127  Resp  Avg: 20.8  Min: 0  Max: 26  SpO2  Avg: 98.2 %  Min: 94 %  Max: 100 %  Pain Score (Numeric, Faces): 5    Current Medications:  acetaminophen (TYLENOL) tablet, 650 mg, Gastric (NG, OG, PEG, GT), Q4H PRN  docusate sodium (COLACE) 65m per mL oral liquid, 100 mg, Gastric (NG, OG, PEG, GT), Daily PRN  electrolyte-A (PLASMALYTE-A) premix infusion, , Intravenous, Continuous  fentaNYL (SUBLIMAZE) 50 mcg/mL injection, 25 mcg, Intravenous, Q1H PRN  hydrALAZINE (APRESOLINE) injection 10 mg, 10 mg, Intravenous, Q4H PRN  HYDROcodone-acetaminophen (NORCO) 5-325 mg per tablet, 1 Tab, Gastric (NG, OG, PEG, GT), Q4H PRN    Or  HYDROcodone-acetaminophen (NORCO) 5-325 mg per tablet, 2 Tab, Gastric (NG, OG, PEG, GT), Q4H PRN  labetalol (TRANDATE) 5 mg/mL injection, 10 mg, Intravenous, Q1H PRN  levETIRAcetam (KEPPRA) 100 mg/mL injection, 500 mg, Intravenous, Q12H  metoprolol tartrate (LOPRESSOR) tablet, 25 mg, Gastric (NG, OG, PEG, GT), 2x/day  morphine 2 mg/mL injection, 2 mg, Intravenous, Q4H PRN  niCARdipine (CARDENE) 50 mg in NS 250 mL (tot vol) infusion, 5 mg/hr, Intravenous, Continuous  nicotine (NICODERM CQ) transdermal patch (mg/24 hr), 14 mg, Transdermal, Daily  NS flush syringe, 2  mL, Intracatheter, Q8HRS    And  NS flush syringe, 2-6 mL, Intracatheter, Q1 MIN PRN  ondansetron (ZOFRAN) 2 mg/mL injection, 4 mg, Intravenous, Q6H PRN  potassium phosphate 30 mmol in NS 500 mL IVPB, 30 mmol, Intravenous, Once  scopolamine 1 mg over 3 days transdermal patch, 1 Patch, Transdermal, Q72H  senna concentrate (SENNA) 5249mper 1553mral liquid, 10 mL, Gastric (NG, OG, PEG, GT), 2x/day  SSIP insulin lispro (HUMALOG) 100 units/mL injection, 0-12 Units, Subcutaneous, Q4H PRN      Today's Physical Exam:   General: Appears stated age, up in chair with helmet  Neurologic:   GCS E1 V1T M5= 7T, intubated, PERRL, gaze midline, no facial weakness; Motor LUE localize, RUE flexion w/d, BLE flexion w/d   Cardiovascular:    Heart regular rate and rhythm  Lungs: diminished bilaterally  Abdomen: soft, non-tender and bowel sounds normal  Extremities: no cyanosis or edema  Skin: Skin warm and dry    Labs:  I have reviewed all lab results.    I/O:  I/O last 24 hours:      Intake/Output Summary (Last 24 hours) at 07/07/2018 0629  Last data filed at 07/07/2018 0500  Gross per 24 hour   Intake 3556 ml   Output 2402.5 ml   Net 1153.5 ml     I/O current shift:  12/14  1900 - 12/15 0659  In: 2706 [I.V.:772; OG:377]  Out: 2376 [Urine:1085; Drains:40]    Drips:   Current Facility-Administered Medications   Medication Dose Frequency Last Rate   . electrolyte-A (PLASMALYTE-A) premix infusion   Continuous 75 mL/hr at 07/06/18 2011   . niCARdipine (CARDENE) 50 mg in NS 250 mL (tot vol) infusion  5 mg/hr Continuous Stopped (07/06/18 0400)         Radiology Results:  MRI brain w/wo contrast 07/06/18: Pending final read    Other Testing:   See below    Microbiology:   See below       Impression and Plan:    Active Hospital Problems    Diagnosis   . 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       Neurologic:  L Tentorial meningioma s/p craniotomy for tumor resection, s/p evacuation of post-op  hemorrhage; POD#2/POD#1  -- [facility] neuro checks as patient is at risk for acute neurological decline  -- MRI brain w/wo contrast 07/06/18: Post-op changes following resection, concern for infarct left midbrain, will f/u final read  -- CT brain 07/08/18 at 0400: Ordered  -- Seizure prophylaxis with Keppra 500 mg BID x 7 days, end 07/12/18  -- STAT CT brain without contrast for any acute change in neurological status     Pain/sedation  -- Tylenol PRN  -- Fentanyl PRN  -- Norco PRN  -- Morphine PRN      Cardiovascular:    HTN, chronic  -- SBP goal < 140  -- Hydralazine/labetalol (x1 overnight) PRN  -- Current antihypertensives:   -- Metoprolol 25 mg BID   -- Holding home quinapril 40 mg Qdaily (not on formulary)       Respiratory:    Acute respiratory failure requiring mechanical ventilation  -- SpO2 goal > 94%  -- Currently on CPAP, FiO2 30%, 8/5  -- CXR on 07/06/18: No acute process  -- Weaning parameters ordered for 12/16 AM      Renal:  Hypernatremia/hyperchloremia, may be 2/2 intravascular depletion  -- 1.8L free water deficit, FWF 200 cc Q6H  -- Will repeat labs in PM    Hypokalemia, hypophosphatemia  -- Repleted      Infectious Disease:   Leukocytosis, etiology unknown  -- Tmax 38.5   -- Cultures:    -- [facility] 07/07/18: Pending   -- Urine culture 07/07/18: Pending   -- MRSA screen 07/07/18: Negative   -- BAL 07/07/18: Ordered  -- Current meds: None; will start empiric abx if febrile again  -- Lactate 07/05/18: 3.1, will repeat now      Hematology:  Anemia, acute  -- Repeat CBC now    Thrombocytopenia, acute  -- Repeat CBC now    DVT prophylaxis  -- SCD's       Gastrointestinal:    At risk for constipation  -- Miralax/senna scheduled  -- LBM PTA      Endocrine:  No acute issues  -- SSIP conservative      Psychiatric:  No acute issues  -- Monitor for ICU delirium       Prophylaxis:    DVT/PE Prophylaxis: SCDs/ Venodynes/Impulse boots  GI: Famotidine  Seizure: Keppra     Family:  None present at  bedside  Disposition/ Baseline:  ICU  Advance Directives:  None-Discussed  Hospice involvement prior to admission?  no      Orvis Brill, PA-C, 07/07/2018, 06:54  Neurocritical Care   Department of Neurology  Pager #  [phone]      Attending Note Findings and Recommendations:    Patient localizes with both arms today  ETT in place  Not a candidate for extubation  Family at bedside and updated    Otherwise Plan as Above in Resident or Midlevel's Note.     Critical Care Attestation   I have reviewed the resident/ midlevel's note.  I agree with their findings and/or have made additions/ edits as well as my findings above.    I was present at the bedside of this critically ill patient exclusive of procedures that are documented elsewhere.  My services were independent and non-duplicative of other practioners of other specialties (non-Neurocritical Care).    This patient suffers from failure or dysfunction of  Neurological system(s).     The care of this patient was in regard to managing (a) conditions(s) that has a high probability of sudden, clinically significant, or life-threatening deterioration and required a high degree of Attending Physician attention and direct involvement to intervene urgently. Data review and care planning was performed in direct proximity of the patient, examination was obviously performed in direct contact with the patient. All of this time was exclusive of procedure which will be documented elsewhere in the chart.   My critical care time involved full attention to the patient's condition and included:   Review of nursing notes and/or old charts   Review of medications, allergies, and vital signs   Documentation time   Consultant collaboration on findings and treatment options   Care, transfer of care, and discharge plans   Ordering, interpreting, and reviewing diagnostic studies/ lab tests   Obtaining necessary history from family, EMS, nursing home staff and/or treating physicians     My  critical care time did not include time spent teaching resident physician(s) or other services of resident physicians, or performing other reported procedures.     Total Critical Care Time: 35 minutes    Valora Corporal, MD 07/07/2018, 19:55

## 2018-07-08 ENCOUNTER — Inpatient Hospital Stay (HOSPITAL_COMMUNITY): Payer: PPO

## 2018-07-08 DIAGNOSIS — D329 Benign neoplasm of meninges, unspecified: Secondary | ICD-10-CM

## 2018-07-08 LAB — H & H
HCT: 24.1 % — ABNORMAL LOW (ref 34.8–46.0)
HGB: 7.9 g/dL — ABNORMAL LOW (ref 11.5–16.0)

## 2018-07-08 LAB — BASIC METABOLIC PANEL
ANION GAP: 6 mmol/L (ref 4–13)
BUN/CREA RATIO: 18 (ref 6–22)
BUN: 12 mg/dL (ref 8–25)
CALCIUM: 7.8 mg/dL — ABNORMAL LOW (ref 8.5–10.2)
CHLORIDE: 116 mmol/L — ABNORMAL HIGH (ref 96–111)
CO2 TOTAL: 24 mmol/L (ref 22–32)
CO2 TOTAL: 24 mmol/L (ref 22–32)
CREATININE: 0.66 mg/dL (ref 0.49–1.10)
ESTIMATED GFR: >60 mL/min/{1.73_m2} (ref >60)
GLUCOSE: 104 mg/dL (ref 65–139)
POTASSIUM: 3.7 mmol/L (ref 3.5–5.1)
SODIUM: 146 mmol/L — ABNORMAL HIGH (ref 136–145)

## 2018-07-08 LAB — CBC
HCT: 22.3 % — ABNORMAL LOW (ref 34.8–46.0)
HGB: 7.4 g/dL — ABNORMAL LOW (ref 11.5–16.0)
MCH: 30.5 pg (ref 26.0–32.0)
MCHC: 33.2 g/dL (ref 31.0–35.5)
MCV: 91.8 fL (ref 78.0–100.0)
PLATELETS: 114 10*3/uL — ABNORMAL LOW (ref 150–400)
RBC: 2.43 10*6/uL — ABNORMAL LOW (ref 3.85–5.22)
RDW-CV: 15.9 % — ABNORMAL HIGH (ref 11.5–15.5)
WBC: 13.4 10*3/uL — ABNORMAL HIGH (ref 3.7–11.0)

## 2018-07-08 LAB — POC BLOOD GLUCOSE (RESULTS)
GLUCOSE, POC: 130 mg/dL — ABNORMAL HIGH (ref 70–105)
GLUCOSE, POC: 138 mg/dL — ABNORMAL HIGH (ref 70–105)
GLUCOSE, POC: 170 mg/dL — ABNORMAL HIGH (ref 70–105)
GLUCOSE, POC: 113 mg/dL — ABNORMAL HIGH (ref 70–105)
GLUCOSE, POC: 118 mg/dL — ABNORMAL HIGH (ref 70–105)
GLUCOSE, POC: 107 mg/dL — ABNORMAL HIGH (ref 70–105)

## 2018-07-08 LAB — HEPARIN DEPENDENT ANTIBODY WITH SEROTONIN RELEASE REFLEX
HIT PATHOLOGIST INTERPRETATION: NORMAL
HIT RESULT: NEGATIVE

## 2018-07-08 LAB — URINE CULTURE,ROUTINE: URINE CULTURE: NO GROWTH

## 2018-07-08 LAB — PHOSPHORUS: PHOSPHORUS: 2.2 mg/dL — ABNORMAL LOW (ref 2.4–4.7)

## 2018-07-08 LAB — MAGNESIUM: MAGNESIUM: 2 mg/dL (ref 1.6–2.6)

## 2018-07-08 MED ORDER — CHLORHEXIDINE GLUCONATE 0.12 % MOUTHWASH
15.00 mL | MOUTHWASH | Freq: Two times a day (BID) | Status: DC
Start: 2018-07-08 — End: 2018-08-08
  Administered 2018-07-08 – 2018-08-06 (×59): 15 mL via ORAL
  Administered 2018-08-07 (×2): 0 mL via ORAL
  Filled 2018-07-08 (×63): qty 15

## 2018-07-08 MED ORDER — ENOXAPARIN 40 MG/0.4 ML SUBCUTANEOUS SYRINGE
40.0000 mg | INJECTION | SUBCUTANEOUS | Status: DC
Start: 2018-07-08 — End: 2018-07-22
  Administered 2018-07-08 – 2018-07-21 (×14): 40 mg via SUBCUTANEOUS
  Filled 2018-07-08 (×14): qty 0.4

## 2018-07-08 MED ORDER — FUROSEMIDE 10 MG/ML INJECTION SOLUTION
40.0000 mg | Freq: Once | INTRAMUSCULAR | Status: AC
Start: 2018-07-08 — End: 2018-07-08
  Administered 2018-07-08 (×2): 40 mg via INTRAVENOUS
  Filled 2018-07-08: qty 4

## 2018-07-08 MED ORDER — POTASSIUM, SODIUM PHOSPHATES 280 MG-160 MG-250 MG ORAL POWDER PACKET
1.0000 | Freq: Four times a day (QID) | ORAL | Status: AC
Start: 2018-07-08 — End: 2018-07-08
  Administered 2018-07-08 (×4): 1 via GASTROSTOMY
  Filled 2018-07-08 (×4): qty 1

## 2018-07-08 MED ORDER — NUTRITION PROTEIN SUPPLEMENT - TUBE FEED
1.0000 | Freq: Two times a day (BID) | Status: DC
Start: 2018-07-08 — End: 2018-07-23
  Administered 2018-07-08 – 2018-07-23 (×31): 15 g via GASTROSTOMY

## 2018-07-08 MED ORDER — LEVETIRACETAM 500 MG TABLET
500.0000 mg | ORAL_TABLET | Freq: Two times a day (BID) | ORAL | Status: DC
Start: 2018-07-08 — End: 2018-07-11
  Administered 2018-07-08 – 2018-07-11 (×6): 500 mg via GASTROSTOMY
  Filled 2018-07-08 (×7): qty 1

## 2018-07-08 MED ORDER — ASCORBIC ACID (VITAMIN C) 500 MG TABLET
500.00 mg | ORAL_TABLET | Freq: Two times a day (BID) | ORAL | Status: DC
Start: 2018-07-08 — End: 2018-08-08
  Administered 2018-07-08 – 2018-08-07 (×61): 500 mg via GASTROSTOMY
  Filled 2018-07-08 (×61): qty 1

## 2018-07-08 MED ORDER — NYSTATIN 100,000 UNIT/GRAM TOPICAL POWDER
Freq: Two times a day (BID) | CUTANEOUS | Status: DC
Start: 2018-07-08 — End: 2018-08-08
  Administered 2018-08-01: 21:00:00 0 via TOPICAL
  Filled 2018-07-08 (×4): qty 15

## 2018-07-08 MED ORDER — LISINOPRIL 10 MG TABLET
40.0000 mg | ORAL_TABLET | Freq: Every day | ORAL | Status: DC
Start: 2018-07-08 — End: 2018-07-20
  Administered 2018-07-08 – 2018-07-20 (×13): 40 mg via GASTROSTOMY
  Filled 2018-07-08 (×13): qty 4

## 2018-07-08 MED ORDER — LANOLIN-OXYQUIN-PET, HYDROPHIL TOPICAL OINTMENT
TOPICAL_OINTMENT | Freq: Two times a day (BID) | CUTANEOUS | Status: DC | PRN
Start: 2018-07-08 — End: 2018-08-08
  Filled 2018-07-08 (×3): qty 1

## 2018-07-08 MED ADMIN — enoxaparin 40 mg/0.4 mL subcutaneous syringe: @ 07:00:00

## 2018-07-08 NOTE — Care Management Notes (Signed)
[facility] Initial Evaluation    Patient Name: [name]  Date of Birth: 05-28-1961  Sex: female  Date/Time of Admission: 07/05/2018  7:26 AM  Room/Bed: 11/A  Payor: HEALTH PLAN / Plan: HEALTH PLAN (PPO-NON ST EMP) / Product Type: PPO /   Primary Care Providers:  Isac Caddy, MD, MD (General)    Pharmacy Info:   Preferred Pharmacy     [id number] - [place], [place] - [address]    [address] [address]    Phone: [phone] Fax: [phone]    Not a 24 hour pharmacy; exact hours not known.        Emergency Contact Info:   Extended Emergency Contact Information  Primary Emergency Contact: [place] Phone: [phone]  Work Phone: [phone]  Mobile Phone: [phone]  Relation: Daughter  Secondary Emergency Contact: Zigmund Daniel, sandy  Address: [address]           [place], [place] [address] Johnnette Litter of [place] Phone: [phone]  Work Phone: [phone]  Mobile Phone: [phone]  Relation: Mother  Interpreter needed? No    History:   [name] is a 57 y.o., female, admitted post craniotomy for tumor with navigation.    Height/Weight: 167.6 cm (5' 5.98") / 86.4 kg (190 lb 7.6 oz)     LOS: 3 days   Admitting Diagnosis: Meningioma (CMS [facility]) [D32.9]    Assessment:      07/08/18 1412   Assessment Details   Assessment Type Admission   Date of Care Management Update 07/08/18   Date of Next DCP Update 07/11/18   Readmission   Is this a readmission? No   Care Management Plan   Discharge Planning Status initial meeting   Projected Discharge Date 07/12/18   CM will evaluate for rehabilitation potential yes  (ongoing)   Discharge Needs Assessment   Equipment Currently Used at Home none   Equipment Needed After Discharge other (see comments)  (TBD)   Discharge Facility/Level of Care Needs Undetermined at this time   Transportation Available family or friend will provide   Referral Information   Admission Type inpatient      Address Verified verified-no changes   Arrived From home or self-care   Insurance Verified verified-no change   ADVANCE DIRECTIVES   Does the Patient have an Advance Directive? Yes, Patient Does Have Advance Directive for Healthcare Treatment   Document the Substance of the Advance Directive (Required) MPOA   Type of Advance Directive Completed Medical Power of Attorney   Copy of Advance Directives in Chart? 0   Name of MPOA or Healthcare Surrogate Ashley Royalty, daughter   Phone Number of MPOA or Healthcare Surrogate [phone]   Employment/Financial   Patient has Prescription Coverage?  Yes        Name of Insurance Coverage for [place] an age group to open "lives with" row.  Adult   Lives With parent(s);child(ren), adult   Living Arrangements house   Able to Return to Prior Arrangements yes   Home Safety   Home Assessment: No Problems Identified   Home Accessibility no concerns   57 y/o female admitted post craniotomy for tumor with navigation.  Patient is on a ventilator and unable to answer initial assessment questions.  Patient's daughters at bedside and completed initial assessment with CCC.  Patient lives with her daughter and  mother in a house.  She and her daughter live in the bottom portion of the house(the basement) and she has 10 steps to get into the basement.  She has approximately 1 step into the home.  Daughter states patient will be able to stay on main living area upon discharge.  Patient is independent and was not using any assist devices or equipment prior to admission.  She has no HH.   She is current with her PCP.  [facility] [place] [facility]  Copy of MPOA scanned into Epic.      Plan is to monitor patient, ventilator support, labs, medication management, NG tube feeds, IVF, PT/OT.    Discharge Plan:  Undetermined at this time  Discharge plans undetermined pending patient progress and  PT/OT recommendations.    The patient will continue to be evaluated for developing discharge needs.     Case Manager: Claudius Sis, [place] COORDINATOR  Phone: [phone]

## 2018-07-08 NOTE — Progress Notes (Signed)
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   GCS: 1/5/1T ; patient localized in the LUE, withdraws in the rest   No adverse events overnight.   Patient tmax 38.7, cultures -ve so for, patient was started on Cefepime today.   -JP drain was removed yesterday.   -Patient is hypernatremic sodium today 146, on tube fee with q4hours 24m free water flush.        Vital Signs:  Temp (24hrs) Max:38.7 C (1542.7F)      Systolic (206CBJ, ASEG:315, Min:98 , MVVO:160    Diastolic (273XTG, AGYI:94 Min:62, Max:88    Temp  Avg: 38.3 C (101 F)  Min: 37.8 C (100 F)  Max: 38.7 C (101.7 F)  MAP (Non-Invasive)  Avg: 85.9 mmHG  Min: 73 mmHG  Max: 100 mmHG  Pulse  Avg: 101.6  Min: 86  Max: 136  Resp  Avg: 20.6  Min: 11  Max: 29  SpO2  Avg: 98.5 %  Min: 94 %  Max: 100 %  Pain Score (Numeric, Faces): Other(CPOT)    Current Medications:  acetaminophen (TYLENOL) tablet, 650 mg, Gastric (NG, OG, PEG, GT), Q4H PRN  cefepime (MAXIPIME) 2 g in NS 100 mL IVPB, 2 g, Intravenous, Q8H  [Held by provider] enoxaparin PF (LOVENOX) 40 mg/0.4 mL SubQ injection, 40 mg, Subcutaneous, Q24H  famotidine (PEPCID) tablet, 20 mg, Gastric (NG, OG, PEG, GT), 2x/day  fentaNYL (SUBLIMAZE) 50 mcg/mL injection, 25 mcg, Intravenous, Q1H PRN  hydrALAZINE (APRESOLINE) injection 10 mg, 10 mg, Intravenous, Q4H PRN  HYDROcodone-acetaminophen (NORCO) 5-325 mg per tablet, 1 Tab, Gastric (NG, OG, PEG, GT), Q4H PRN    Or  HYDROcodone-acetaminophen (NORCO) 5-325 mg per tablet, 2 Tab, Gastric (NG, OG, PEG, GT), Q4H PRN  labetalol (TRANDATE) 5 mg/mL injection, 10 mg, Intravenous, Q1H PRN  levETIRAcetam (KEPPRA) 100 mg/mL  injection, 500 mg, Intravenous, Q12H  lisinopril (PRINIVIL) tablet, 40 mg, Gastric (NG, OG, PEG, GT), Daily  metoprolol tartrate (LOPRESSOR) tablet, 25 mg, Gastric (NG, OG, PEG, GT), 2x/day  morphine 2 mg/mL injection, 2 mg, Intravenous, Q4H PRN  nicotine (NICODERM CQ) transdermal patch (mg/24 hr), 14 mg, Transdermal, Daily  NS flush syringe, 2 mL, Intracatheter, Q8HRS    And  NS flush syringe, 2-6 mL, Intracatheter, Q1 MIN PRN  ondansetron (ZOFRAN) 2 mg/mL injection, 4 mg, Intravenous, Q6H PRN  polyethylene glycol (MIRALAX) oral packet, 17 g, Gastric (NG, OG, PEG, GT), Daily  potassium & sodium phosphate (PHOS-NA-K) 250-160-2870mper oral packet, 1 Packet, Gastric (NG, OG, PEG, GT), 4x/day-Food  senna concentrate (SENNA) 52874mer 25m48mal liquid, 10 mL, Gastric (NG, OG, PEG, GT), 2x/day  SSIP insulin lispro (HUMALOG) 100 units/mL injection, 0-12 Units, Subcutaneous, Q4H PRN      Today's Physical Exam:   General: Appears stated age, up in chair with helmet  Neurologic:   GCS E1 V1T M5= 7T, intubated, PERRL, gaze midline, no facial weakness; Motor LUE localize, RUE flexion w/d, BLE flexion w/d   Cardiovascular:    Heart regular rate and rhythm  Lungs: diminished bilaterally  Abdomen: soft, non-tender and bowel sounds normal  Extremities:  no cyanosis or edema  Skin: Skin warm and dry    Labs:  I have reviewed all lab results.    I/O:  I/O last 24 hours:      Intake/Output Summary (Last 24 hours) at 07/08/2018 0801  Last data filed at 07/08/2018 0700  Gross per 24 hour   Intake 1279 ml   Output 1870 ml   Net -591 ml     I/O current shift:  12/16 0700 - 12/16 1859  In: -   Out: 45 [Urine:45]    Drips:   Current Facility-Administered Medications   Medication Dose Frequency Last Rate         Radiology Results:  MRI brain w/wo contrast 07/06/18: Pending final read    Other Testing:   See below    Microbiology:   See below       Impression and Plan:    Active Hospital Problems    Diagnosis   . 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       Neurologic:  L Tentorial meningioma s/p craniotomy for tumor resection, s/p evacuation of post-op hemorrhage; POD#2/POD#1  -- [facility] neuro checks as patient is at risk for acute neurological decline  -- MRI brain w/wo contrast 07/06/18: Post-op changes following resection, concern for infarct left midbrain, will f/u final read  - JP drain removed on 07/07/18  - Lumbar drain removed on 07/06/18  -- CT brain 07/08/18 : mild increase in cerebral edema   -- Seizure prophylaxis with Keppra 500 mg BID x 7 days, end 07/12/18  -- STAT CT brain without contrast for any acute change in neurological status     Pain/sedation  -- Tylenol PRN  -- Fentanyl PRN  -- Norco PRN  -- Morphine PRN      Cardiovascular:    HTN, chronic  -- SBP goal < 140  -- Hydralazine/labetalol (x1 overnight) PRN  -- Current antihypertensives:   -- Metoprolol 25 mg BID   -- Holding home quinapril 40 mg Qdaily (not on formulary)       Respiratory:    Acute respiratory failure requiring mechanical ventilation  -- SpO2 goal > 94%  -- Currently on CPAP, FiO2 30%, 8/5  -- CXR on 07/06/18: No acute process  -- Weaning parameters ordered for 12/16 AM      Renal:  Hypernatremia/hyperchloremia, may be 2/2 intravascular depletion  -- 1.8L free water deficit, FWF 200 cc Q6H    hypophosphatemia  -- Repleted      Infectious Disease:   Leukocytosis-improving could be reactive   -- Tmax 38.7  -- Cultures:    -- [facility] 07/07/18:   -ve at day 1   -- Urine culture 07/07/18:No UTI    -- MRSA screen 07/07/18: Negative   -- BAL 07/07/18: Ordered  -- Current meds: cefepime 2 g q8 hours day 1  -- Lactate 07/05/18: 3.1, will repeat now      Hematology:Normocytic anemia   -h/h today 7.4/22.3  -could be surgery associated   -will do anemia workup   -continue to monitor and transfuse if hb<7      Thrombocytopenia, acute  -Platelete count dropped from 256 to 114 over two days  -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)  4T score  4 (intermediate risk)  -will order HIT panel , hold lovenox  , start Fondaparinux for ppx   -continue to monitor daily     DVT prophylaxis  -- SCD's  Gastrointestinal:    At risk for constipation  -- Miralax/senna scheduled  -- LBM PTA      Endocrine:  No acute issues  -- SSIP conservative      Psychiatric:  No acute issues  -- Monitor for ICU delirium       Prophylaxis:    DVT/PE Prophylaxis: SCDs/ Venodynes/Impulse boots  GI: Famotidine  Seizure: Keppra     Family:  None present at bedside  Disposition/ Baseline:  ICU  Advance Directives:  None-Discussed  Hospice involvement prior to admission?  no    Greggory Brandy, MD   [facility] neurology PGY-2   Pager [place]   I have reviewed the resident/ midlevel's note.  I agree with their findings and/or have made additions/ edits as well as my findings below.    This is a 57 year-old female with chronic  hypertension, hyperlipidemia, GERD, and tobacco use whom I am managing for:   -  Status post resection of a left-sided symptomatic meningioma,  with associated right-sided weakness   - Intraoperative hemorrhage with post-op evacuation of hematoma   -  acute hypoxic respiratory failure   -  anemia    1)  Neurological:  Neurological exam notable for the following borderline following commands (stick out tongue but not squeeze fingers), right hemiparesis. Continue PT/OT/SLP.  2)  Cardiovascular:  Hemodynamically stable, no arrhythmias. Neuroscience blood pressure goals < 160.  Use Labetalol/hydralazine per parameters. Continue current regimen Metoprolol 52m bid.  3)  Pulmonary:  Acute hypoxic respiratory failure, continue mechanical ventilation with following settings CPAP 02/26/39%.  Continue pulmonary toilet.  ABG adequate.  Continue intubation due to frequency of secretions - will try Lasix 475mIV once.  4)  Renal:  No evidence of AKI. I/Os adequate.  Electrolytes within range.  FW 20028m6h.  5)  Gastrointestinal:  Nutrition with  Osmolite. GI prophylaxis with Famotidine. Continue bowel regimen.  6)  Hematological:  Anemia (7.4, downtrending), transfuse for Hgb < 7.  Thrombocytopenia (114), HIT panel sent but unlikely, resume Lovenox.  Transfuse for Plts < 100.  7)  Endocrine:  Continue insulin sliding scale to maintain glucose < 180.  8)  Infectious Disease:  Low-grade fevers, leukocytosis 13.4, cultures NGTD.  Empiric Cefepime started 2 days ago, continue to follow-up on cultures.  9)  Lines/Access/Restraints:            Lines:  ETT, OGT, Foley (to be removed following Lasix dose)  Access:  PIVs   Skin:  (Right flank blister prior to admission)  10)  Disposition:  Remain in NSICU due to ventilatory support.  11)  Code Status:  FULL CODE  12)  Patient/Family Discussion:  I have updated the family on rounds today.    I was present at the bedside of this critically ill patient for 40 minutes exclusive of procedures that are documented elsewhere.  My services were independent and non-duplicative of other practioners of other specialties (non-Neurocritical Care).  The care of this patient was in regard to managing (a) conditions(s) that has a high probability of sudden, clinically significant, or life-threatening deterioration and required a high degree of Attending Physician attention and direct involvement to intervene urgently. Data review and care planning was performed in direct proximity of the patient, examination was obviously performed in direct contact with the patient. All of this time was exclusive of procedure which will be documented elsewhere in the chart.   My critical care  time involved full attention to the patient's condition and included:   * Review of nursing notes and/or old charts   * Review of medications, allergies, and vital signs   * Documentation time   * Consultant collaboration on findings and treatment options   * Care, transfer of care, and discharge plans   * Ordering, interpreting, and reviewing diagnostic  studies/ lab tests   * Obtaining necessary history from family, EMS, nursing home staff and/or treating physicians     My critical care time did not include time spent teaching resident physician(s) or other services of resident physicians, or performing other reported procedures.     Total Critical Care Time: 40 minutes    Edwyna Perfect, MD  Neurocritical Care Attending  07/08/2018  17:17

## 2018-07-08 NOTE — Care Plan (Signed)
[facility]  Physical Therapy Initial Evaluation    Patient Name: [name]  Date of Birth: 09/04/1960  Height: Height: 167.6 cm (5' 5.98")  Weight: Weight: 86.4 kg (190 lb 7.6 oz)  Room/Bed: 11/A  Payor: HEALTH PLAN / Plan: HEALTH PLAN (PPO-NON ST EMP) / Product Type: PPO /     Assessment:      (P) [name] remains intubated with severe facial edema, unable to open eyes, no active movement RUE/LE and L foot noted to be cold (RN aware) but now demonstrating strong spontaneous movement LUE/LE. PT will follow up tomorrow AM to reassess re cognition, sensation , motor. Anticipate she will need extensive residential rehab .    Discharge Needs:    Equipment Recommendation: (P) TBD   Discharge Disposition: (P) inpatient rehabilitation facility    JUSTIFICATION OF DISCHARGE RECOMMENDATION   Based on current diagnosis, functional performance prior to admission, and current functional performance, this patient requires continued PT services in (P) inpatient rehabilitation facility in order to achieve significant functional improvements in these deficit areas: (P) aerobic capacity/endurance, arousal, attention, and cognition, gait, locomotion, and balance, neuromuscular, ventilation and respiration/gas exchange.    Plan:   Current Intervention:    To provide physical therapy services (P) minimum of 3x/week  for duration of (P) until discharge.    The risks/benefits of therapy have been discussed with the patient/caregiver and he/she is in agreement with the established plan of care.     Past Medical History:   Diagnosis Date   . Anxiety    . Arthropathy    . Cancer (CMS HCC)     brain tumor   . Dyspnea on exertion    . Esophageal reflux     does not take meds   . Heart murmur     benign   . HTN (hypertension)    . Hyperlipemia    . Muscle weakness     right sided weakness   . Palpitations    . Shortness of breath    . Wears glasses     reading         Past Surgical History:      Procedure Laterality Date   . HX APPENDECTOMY     . HX BREAST AUGMENTATION Bilateral    . HX LAP CHOLECYSTECTOMY     . HX LUMBAR DISKECTOMY  1999   . HX OTHER Right     benign tumor removed from upper right leg x3   . HX OTHER      exploratory lap   . HX TUBAL LIGATION            reports that she has been smoking cigarettes. She has a 30.00 pack-year smoking history. She has never used smokeless tobacco. She reports that she does not drink alcohol or use drugs.  Social History     Tobacco Use   Smoking Status Current Every Day Smoker   . Packs/day: 1.00   . Years: 30.00   . Pack years: 30.00   . Types: Cigarettes   Smokeless Tobacco Never Used   Tobacco Comment    down to 8 cigarettes, counseled on 1-800-QUIT_NOW         Subjective & Objective        07/08/18 1722   Therapist Pager   PT Assigned/ Pager # [phone]   Rehab Session   Document Type evaluation   Total PT Minutes: 37   Symptoms Noted During/After Treatment fatigue  General Information   Patient Profile Reviewed? yes   Patient/Family/Caregiver Comments/Observations Daughters state they suspect patient has Careers information officer as do many other family members and worried about how this may effect healing. Family provided a lot of background information on patient  and discussed their concerns    Pertinent History of Current Functional Problem [name] is a 57 y.o. female who has a 2-3 year history of gradual progressive R upper and lower extremities weakness who recently seen Dr. Jimmey Ralph and was recently admitted for worsening of R sided weakness and numbness. 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 She was admitted 12/13, and underwent  Left temporal craniotomy for resection of left tentorial meningioma, complicated by post -op hemorrhage of the tumor bed and need to return to OR later that day for decompressive  craniectomy and evacuation of the hematomo  She is currently in NCCU and remains intubated. Patient is familiar to PT from earlier pre-op admission.   Medical Lines Arterial Line;Foley Catheter;PIV Line;Restraints;Telemetry   Respiratory Status ventilator   Existing Precautions/Restrictions aspiration precautions;fall precautions;full code   Mutuality/Individual Preferences   Individualized Care Needs Assist to move R limbs and removal of wrist restraints for BUE AAROM   Patient-Specific Goals (Include Timeframe) Family remains hopeful that patients edema will go down.   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 She stopping driving on advise of MD after last clinic visit    Self-Care   Dominant Hand right   Equipment Currently Used at Home no  (has a straight cane but has not been using)   Pre Treatment Status   Pre Treatment Patient Status Patient supine in bed;Nurse approved session   Support Present Pre Treatment  Family present  (2 daughters)   Communication Pre Treatment Comment RN indicated that the patient had not been moving any limbs since surgery until recently this PM began to actively move LUE , LLE   Cognitive Assessment/Interventions   Behavior/Mood Observations lethargic   Orientation Status unable/difficult to assess      Follows Commands follows one step commands;does not follow one step commands  (very inconsistant, followed a couple times)   Comment Eyes are both swollen shut so more difficult to assess alertness   Vital Signs   Vitals Comment VSS with patient on moniitor continuously   Pain Assessment   Pre/Post Treatment Pain Comment No behaviors suggest pain   RUE Assessment   RUE Assessment X- Exceptions   RUE ROM WNL t/o   RUE Strength 0/5   RUE Sensation   (deferred)   LUE Assessment   LUE Assessment WFL- Within Functional Limits  (strong, full PROM t/o )  RLE Assessment   RLE Assessment X-Exceptions   RLE ROM WNL t/o   RLE Strength 0/5   RLE Sensation   (deferred)   RLE Other foot notably colder than L   LLE Assessment   LLE Assessment WFL- Within Functional Limits  (strong resistance, appears to be aware of touch t/o)   Therapeutic Exercise/Activity   Comment AAROM and resisted ROM L U/LE , and PROM with scapular mobilization and end range stretches for RLE .   Post Treatment Status   Post Treatment Patient Status Patient supine in bed;Restraints in place (specify in comments)  (B wrists)   Support Present Post Treatment  Family present   Communication Post Treatement Nurse   Plan of Care Review   Plan Of Care Reviewed With patient;family   Basic Mobility Am-PAC/6Clicks Score   Turning in bed without bedrails 1   Lying on back to sitting on edge of flat bed 1   Moving to and from a bed to a chair 1   Standing up from chair 1   Walk in room 1   Climbing 3-5 steps with railing 1   6 Clicks Raw Score total 6   Standardized (t-scale) score 16.59   CMS 0-100% Score 100   CMS Modifier CN   Patient Mobility Goal (JHHLM) 3- Sit at edge of bed 3X/day   Exercise/Activity Level Performed 2- Turned self in bed/ROM (active or passive)   Physical Therapy Clinical Impression   Assessment [name] remains intubated with severe facial edema, unable to open eyes, no active movement RUE/LE and L foot noted to be cold (RN aware) but  now demonstrating strong spontaneous movement LUE/LE. PT will follow up tomorrow AM to reassess re cognition, sensation , motor. Anticipate she will need extensive residential rehab .   Patient/Family Goals Statement Hopeful she will regain R side movement and strength   Criteria for Skilled Therapeutic yes   Pathology/Pathophysiology Noted neuromuscular;other (see comments)  (cognitve, other issues may present w/ added evaluation)   Impairments Found (describe specific impairments) aerobic capacity/endurance;arousal, attention, and cognition;gait, locomotion, and balance;neuromuscular;ventilation and respiration/gas exchange   Functional Limitations in Following  self-care;home management;work;community/leisure   Disability: Inability to Perform work;community/leisure   Rehab Potential fair, will monitor progress closely   Therapy Frequency minimum of 3x/week   Predicted Duration of Therapy Intervention (days/wks) until discharge   Anticipated Equipment Needs at Discharge (PT) TBD   Anticipated Discharge Disposition inpatient rehabilitation facility   Evaluation Complexity Justification   Patient History: Co-morbidity/factors that impact Plan of Care 1-2 that impact Plan of Care   Examination Components 4 or more Exam elements addressed;3 or more Exam elements addressed   Presentation Unstable: Characteristics of condition unpredictable &/or significant cognitive deficits affect safety   Clinical Decision Making High complexity   Evaluation Complexity Moderate complexity   Care Plan Goals   PT Rehab Goals Bed Mobility Goal;Physical Therapy Goal;Bed Mobility Goal 2   Physical Therapy Goal   PT  Goal, Date Established 07/08/18   PT Goal, Time to Achieve 1 wk   PT Goal, Activity Type increase R side strength at least 0.5 grade hip and knee   Bed Mobility Goal   Bed Mobility Goal, Date Established 07/08/18   Bed Mobility Goal, Time to Achieve 1 wk   Bed Mobility Goal, Activity Type roll left/roll right   Bed Mobility  Goal, Independence Level minimum assist (75% patient effort)   Bed Mobility Goal 2   Bed Mobility Goal, Date  Established 07/08/18   Bed Mobility Goal, Time to Achieve 1 wk   Bed Mobility Goal, Activity Type sidelying to sit/sit to sidelying   Bed Mobility Goal, Independence Level 2 person assist required;moderate assist (50% patient effort)           Therapist:   Cherlyn Labella, PT   Pager #: [phone]

## 2018-07-08 NOTE — Care Plan (Signed)
Patient making progress towards D/C goals. VSS and assessment per flow sheets. GCS 1-5-1T. Localizes in her LUE and withdraws her lower extremities and RUE. Patient is intubated and unable to verbalize at this time. Patient has not opened eyes despite family's efforts to get patient to open them. Foley remains in place draining adequate amounts of clear yellow urine. Mepliex remains in place on sacrum, preventative. Antibiotics continued. Tube feed tolerated well. OOB with the maxi move with helmet on at all times.  CT scheduled to be completed tonight. Potassium replacements ordered per NCCU.     Problem: Adult Inpatient Plan of Care  Goal: Plan of Care Review  Outcome: Ongoing (see interventions/notes)  Flowsheets (Taken 07/08/2018 0131)  Plan of Care Reviewed With: patient; family  Goal: Patient-Specific Goal (Individualization)  Outcome: Ongoing (see interventions/notes)  Flowsheets (Taken 07/08/2018 0131)  Individualized Care Needs: Personal blanket at bedside  Patient-Specific Goals (Include Timeframe): Family remains hopeful that patient will open her eyes  Goal: Absence of Hospital-Acquired Illness or Injury  Outcome: Ongoing (see interventions/notes)  Goal: Optimal Comfort and Wellbeing  Outcome: Ongoing (see interventions/notes)  Goal: Rounds/Family Conference  Outcome: Ongoing (see interventions/notes)  Flowsheets (Taken 07/07/2018 0515)  Participants: family;advanced practice nurse;nursing;patient;pharmacy;physician;physician assistant     Problem: Communication Impairment (Mechanical Ventilation, Invasive)  Goal: Effective Communication  Outcome: Ongoing (see interventions/notes)     Problem: Device-Related Complication Risk (Mechanical Ventilation, Invasive)  Goal: Optimal Device Function  Outcome: Ongoing (see interventions/notes)     Problem: Inability to Wean (Mechanical Ventilation, Invasive)  Goal: Mechanical Ventilation Liberation  Outcome: Ongoing (see interventions/notes)     Problem: Nutrition  Impairment (Mechanical Ventilation, Invasive)  Goal: Optimal Nutrition Delivery  Outcome: Ongoing (see interventions/notes)     Problem: Skin and Tissue Injury (Mechanical Ventilation, Invasive)  Goal: Absence of Device-Related Skin and Tissue Injury  Outcome: Ongoing (see interventions/notes)     Problem: Ventilator-Induced Lung Injury (Mechanical Ventilation, Invasive)  Goal: Absence of Ventilator-Induced Lung Injury  Outcome: Ongoing (see interventions/notes)     Problem: Fall Injury Risk  Goal: Absence of Fall and Fall-Related Injury  Outcome: Ongoing (see interventions/notes)     Problem: Skin Injury Risk Increased  Goal: Skin Health and Integrity  Outcome: Ongoing (see interventions/notes)     Problem: Non-violent/Non-Self Destructive Restraints  Goal: Alternative methods tried prior to restraints  Outcome: Ongoing (see interventions/notes)  Goal: Patient free from injury and discomfort  Outcome: Ongoing (see interventions/notes)  Goal: Autonomy maintained at the highest possible level  Outcome: Ongoing (see interventions/notes)  Goal: Need for restraints reassessed per policy  Outcome: Ongoing (see interventions/notes)  Goal: Patient education provided  Outcome: Ongoing (see interventions/notes)  Goal: Problem Interventions  Outcome: Ongoing (see interventions/notes)     Problem: Hypertension Comorbidity  Goal: Blood Pressure in Desired Range  Outcome: Ongoing (see interventions/notes)     Problem: Depression  Goal: Improved Mood  Outcome: Ongoing (see interventions/notes)

## 2018-07-08 NOTE — Care Plan (Addendum)
Medical Nutrition Therapy Assessment        SUBJECTIVE : Pt currently intubated. RD unable to verify diet or weight history at this time. Per nursing note, pt is tolerating current TF well.    Current Diet Order/Nutrition Support:    ADULT TUBE FEED - BOLUS TID OSMOLITE 1.5; NG; Flush Tube: 200 ml; Q4H     Height Used for Calculations: 167.6 cm (5' 5.98")  Weight Used For Calculations: 78.2 kg (172 lb 6.4 oz)(standing weight upon admission)  BMI (kg/m2): 27.9  BMI Assessment: BMI 25-29.9: overweight  Ideal Body Weight (IBW) (kg): 59.54  % Ideal Body Weight: 131.34  Adjusted/Standard Body Weight  Adjusted BW: 64 kg      Estimated Needs: (intubated)    Energy Calorie Requirements: 3382-5053 cal/kg(25-30 cal/78.2kg BW)    Protein Requirements (gms/day): 90-120 g/pro/day(1.5-2 g/pro/59 kg IBW) per day   Fluid Requirements: 9767-3419 ml/day(1 ml/cal)    Comments: 57 y.o. female with gradually progressive R upper and lower extremity weakness over the past 2-3 years. S/p craniotomy for tumor resection. Currently intubated and on TF.    Plan/Interventions :   1. Increase TF as follows:   Tube Feed Formula : Osmolite 1.5   Bolus 1 can 5 times daily + 1 packet of ProSource BID  Provides: 1920 cal = 25 cals/kg               105 g protein = 1.8 g/kg                914 ml free water + free water flushes as needed  2. Monitor I/O's and tolerance of TF, provide free water flushes PRN  3. Monitor weights daily- standing, when possible, for accuracy  4. Continue bowel regimen as needed (last BM 12/16)  5. Continue reflux precautions as needed  6. Continue finger sticks as needed to monitor blood glucose  levels (P.O.C. 113-170 last 2 days); adjust insulin as needed  7. Monitor Phos trends (12/15) 2.3.2.1,2.8 (12/16) 2.2, replace as needed  8. Add 500 mg Vitamin C BID  9. Advance diet per SLP rec's as medically appropriate     RD will continue to follow     Nutrition Diagnosis: Inability to swallow related to Patient on vent as  evidenced by Need for TF    Michaelyn Barter, Dietetic Intern 07/08/2018, 09:30     I reviewed the intern's note and agree with the findings and recommendations documented. Any exceptions/additions are edited/noted.    Tobie Lords, PhD, RDLD  07/08/2018, 09:49      Pager # [phone]    Problem: Feeding Intolerance (Enteral Nutrition)  Goal: Feeding Tolerance  Outcome: Ongoing (see interventions/notes)     Problem: Adult Inpatient Plan of Care  Goal: Plan of Care Review  Outcome: Ongoing (see interventions/notes)

## 2018-07-08 NOTE — Nurses Notes (Addendum)
Patient continues in [place] restraints related to agitation and to limit the ability to pull airways.

## 2018-07-08 NOTE — Ancillary Notes (Signed)
[facility]  Spiritual Care Note    Patient Name:  [name]  Date of Encounter:   07/08/2018     07/08/18 3810   Clinical Encounter Type   Reason for Visit Patient/Person/Family Request   Referral From Chaplain (on-call)   [place] Visit At This Time No   Visited With   (Two Daughters)   Family Spiritual Encounters   Spiritual Assessment Anxiety;Grief;Weariness   Spiritual/Coping Resources Prayer/devotional life;Supportive family system   Interpersonal/Family Stressors Sickness of other family members   Coping  Encouraged self-care;Facilitated story telling;Facilitated grief   Ritual  Prayer   Spiritual Care Outcomes with Family   Spiritual/Emotional Processing Spiritual Care relationship established;Family shared/processed patient's story   Family Coping More peaceful   Time of Encounters   Start Time [phone]   Stop Time [phone]   Duration (minutes) 16 Minutes     Other Pertinent Information: Followed up on Family's request for a Chaplain, via [employer]. Daughters shared Patient's past employment and what brought her to the hospital. One daughter said, "We are so tired." Daughters said others were praying for Patient, but that they would like another prayer. Self-Care in the form of rest was discussed for Daughters. Prayer was offered. Daughters were more at peace.    Carola Rhine, [place]  Pager: [phone]  Total Time of Encounter: 24 min.

## 2018-07-08 NOTE — Nurses Notes (Signed)
Telemetry called and stated patient had SVT lasting 1.92 seconds and directly had 12 beath run of V-tach. Patient was being turned and repositioned at time and also had been incontinent. NCCU notified and stated to call if either occur again while patient is not being turned and repositioned.

## 2018-07-08 NOTE — OR Surgeon (Signed)
PATIENT NAME: [name], [name]   HOSPITAL NUMBER:  [id number]  DATE OF SERVICE: 07/05/2018  DATE OF BIRTH:  02/05/1961    OPERATIVE REPORT    POSTOPERATIVE DIAGNOSIS:  Status post excision of a left anterior tentorial meningioma with hemorrhage in the tumor bed and mass effect.    NAME OF PROCEDURE:  Re-exploration, evacuation of hemorrhage, and frontotemporal craniectomy.    SURGEON:  [name], MD.    ASSISTANTHipolito Bayley, MD PhD.    ANESTHESIA:  General.    POSITION:  Lateral.    ESTIMATED BLOOD LOSS:  Minimal.    COMPLICATIONS:  None.    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  Following the procedure, the patient was unable to be extubated.  She was bleeding on her own, but was not localizing to pain or following any commands.  At this time, we also noted that the patient's left pupil was fully dilated at approximately 6-7 mm in size.  We then took the patient for CT scan still intubated and a CT scan showed hemorrhage in the tumor bed with compression of the brainstem and adjacent thalamus.  We then brought her back emergently to the operating room for evacuation of this hemorrhage.  This situation was also discussed with the patient's family.    DESCRIPTION OF PROCEDURE:  The patient was positioned lateral on the operating table with the left side up.  The head was then secured in the [place] 3-point pin system.  All pressure points were padded.  The previous drain was removed.  The parts were prepped and draped with Betadine soap and solution.  The previous staples were all removed.  After prepping and draping, a surgical pause was carried out and we then removed all the staples from the incision and then we opened the incision back up.  The skin and muscle were held apart with cerebellar retractors.  The cranioplasty from the HydroSet was then  undone and the lateral screws from the mini plates from the Stryker system were then removed.  The bone flap was elevated.  The dural stitches were divided and the dura was held apart.  The temporal lobe was noted to be swollen and somewhat tight.  We used malleable retractors and gradually entered the subtemporal area.  Some amount of blood was encountered in this area.  The previous Surgicel and Gelfoam were completely removed.  We then inspected the edge of the tentorium and then inspected the area of the hemorrhage infratentorially.  We brought the microscope into the field and under the microscope, we then dissected the clot from infratentorial area as well as the supratentorial area in the tumor cavity, in the thalamus, and in the basal ganglia.  All visible clot was gradually removed.  We did not find a single bleeding vessel.  However, there was diffuse oozing from multiple places as the clot was removed and these were gently coagulated with the bipolar forceps.  Thorough irrigation was then carried out.  The retractors were removed and we noticed that the temporal lobe still appeared to be swollen and rising slightly above the level of the craniectomy.  Therefore, we decided to leave the bone flap off and additionally perform a frontal craniectomy to maximize the area of the decompression available for the brain within the limits of the incision.  We then took a footplate craniotome and divided  the frontal part of the bone superior to the previous craniectomy.  We also then opened the dura and allowed the brain to relax.  A small amount of subdural blood was seen in this area.  This was gradually evacuated.  Thorough irrigation was carried out.  A piece of DuraMatrix was then cut to size and was placed over the craniectomy defect.  The dural edges were then brought over the DuraMatrix patch and gently secured in place with 4-0 Nurolon sutures.  The myocutaneous flap was then replaced and a #10 JP drain was  placed in the subgaleal plane.  The myocutaneous flap was then repaired using 2-0 Vicryl for the temporal muscle and fascia, 2-0 Vicryl for the galea, and staples for the skin.  At the conclusion of the procedure, the patient was taken back to the intensive care unit in a stable condition but still intubated.  Her left pupil appeared to have decreased in size.        [name], MD  Associate Professor   [place] Neurosurgery              DD:  07/07/2018 10:46:58  DT:  07/08/2018 07:55:11 NW  D#:  [id number]

## 2018-07-08 NOTE — Nurses Notes (Addendum)
Patient continues in [place] restraints related to agitation and to limit the ability to pull airways.

## 2018-07-08 NOTE — Care Plan (Signed)
Discharge Plan:  Undetermined at this time  Discharge plans undetermined pending patient progress and PT/OT recommendations.

## 2018-07-08 NOTE — Care Plan (Signed)
Pt remains on PS 8 PEEP 5 and FIO2 30%.  Pt not following commands.  No plans to extubate today.  May want to have WP in am to see if pt has improved as last RSBI was over 100 yesterday.

## 2018-07-08 NOTE — Care Plan (Signed)
VENTILATOR - CPAP(PS) / SPONTANEOUS CONTINUOUS    Discontinue   Duration: Until Specified    Priority: Routine       Question Answer Comment   FIO2 (%) 30    Peep(cm/H2O) 5    Pressure Support(cm/H2O) 8    Indications IMPROVE DISTRIBUTION OF VENTILATION    Invasive/Non-Invasive INVASIVE         The patient is currently on the vent settings listed above. No changes were made. She has clear diminished BBS at this time. She has maintained an SPO2 within the high 90s through the night. Respiratory parameters are ordered for this patient. However, she was unable to complete parameters. Instruction was given, but she was not able to be aroused in order to follow directions. Respiratory will continue to monitor and wean as patient tolerates.

## 2018-07-08 NOTE — Care Plan (Signed)
Problem: Adult Inpatient Plan of Care  Goal: Plan of Care Review  Outcome: Ongoing (see interventions/notes)  Goal: Patient-Specific Goal (Individualization)  Outcome: Ongoing (see interventions/notes)  Flowsheets (Taken 07/08/2018 1441)  Individualized Care Needs: Continue to encourage patient to move extremities.  Anxieties, Fears or Concerns: Family is worried that patient will not regain use of right side.  Patient-Specific Goals (Include Timeframe): Family remains hopeful that patients edema will go down.  Goal: Absence of Hospital-Acquired Illness or Injury  Outcome: Ongoing (see interventions/notes)  Goal: Optimal Comfort and Wellbeing  Outcome: Ongoing (see interventions/notes)  Goal: Rounds/Family Conference  Outcome: Ongoing (see interventions/notes)     Problem: Communication Impairment (Mechanical Ventilation, Invasive)  Goal: Effective Communication  Outcome: Ongoing (see interventions/notes)     Problem: Device-Related Complication Risk (Mechanical Ventilation, Invasive)  Goal: Optimal Device Function  Outcome: Ongoing (see interventions/notes)     Problem: Inability to Wean (Mechanical Ventilation, Invasive)  Goal: Mechanical Ventilation Liberation  Outcome: Ongoing (see interventions/notes)     Problem: Nutrition Impairment (Mechanical Ventilation, Invasive)  Goal: Optimal Nutrition Delivery  Outcome: Ongoing (see interventions/notes)     Problem: Skin and Tissue Injury (Mechanical Ventilation, Invasive)  Goal: Absence of Device-Related Skin and Tissue Injury  Outcome: Ongoing (see interventions/notes)     Problem: Ventilator-Induced Lung Injury (Mechanical Ventilation, Invasive)  Goal: Absence of Ventilator-Induced Lung Injury  Outcome: Ongoing (see interventions/notes)     Problem: Fall Injury Risk  Goal: Absence of Fall and Fall-Related Injury  Outcome: Ongoing (see interventions/notes)     Problem: Skin Injury Risk Increased  Goal: Skin Health and Integrity  Outcome: Ongoing (see  interventions/notes)     Problem: Non-violent/Non-Self Destructive Restraints  Goal: Alternative methods tried prior to restraints  Outcome: Ongoing (see interventions/notes)  Goal: Patient free from injury and discomfort  Outcome: Ongoing (see interventions/notes)  Goal: Autonomy maintained at the highest possible level  Outcome: Ongoing (see interventions/notes)  Goal: Need for restraints reassessed per policy  Outcome: Ongoing (see interventions/notes)  Goal: Patient education provided  Outcome: Ongoing (see interventions/notes)  Goal: Problem Interventions  Outcome: Ongoing (see interventions/notes)     Problem: Hypertension Comorbidity  Goal: Blood Pressure in Desired Range  Outcome: Ongoing (see interventions/notes)     Problem: Depression  Goal: Improved Mood  Outcome: Ongoing (see interventions/notes)     Problem: Feeding Intolerance (Enteral Nutrition)  Goal: Feeding Tolerance  Outcome: Ongoing (see interventions/notes)   Patient intubated and unable to speak at this time. Staff turns and repositions patient every two hours and as necessary. Furosemide administered IV as ordered to assist in decreasing edema. Continues on IV antibiotics. Patient currently showing no signs or symptoms of distress at this time.

## 2018-07-09 ENCOUNTER — Inpatient Hospital Stay (HOSPITAL_COMMUNITY)

## 2018-07-09 DIAGNOSIS — G936 Cerebral edema: Secondary | ICD-10-CM

## 2018-07-09 DIAGNOSIS — I499 Cardiac arrhythmia, unspecified: Secondary | ICD-10-CM

## 2018-07-09 DIAGNOSIS — R4182 Altered mental status, unspecified: Secondary | ICD-10-CM

## 2018-07-09 LAB — ECG 12-LEAD
Atrial Rate: 95 {beats}/min
Calculated P Axis: 19 degrees
Calculated R Axis: -2 degrees
Calculated R Axis: -2 degrees
Calculated T Axis: -6 degrees
Calculated T Axis: -6 degrees
PR Interval: 120 ms
QRS Duration: 90 ms
QT Interval: 360 ms
QTC Calculation: 452 ms
Ventricular rate: 95 {beats}/min

## 2018-07-09 LAB — BASIC METABOLIC PANEL
ANION GAP: 11 mmol/L (ref 4–13)
BUN/CREA RATIO: 27 — ABNORMAL HIGH (ref 6–22)
BUN: 20 mg/dL (ref 8–25)
CALCIUM: 9 mg/dL (ref 8.5–10.2)
CHLORIDE: 108 mmol/L (ref 96–111)
CO2 TOTAL: 28 mmol/L (ref 22–32)
CREATININE: 0.75 mg/dL (ref 0.49–1.10)
ESTIMATED GFR: >60 mL/min/1.73mˆ2 (ref >60)
GLUCOSE: 109 mg/dL (ref 65–139)
POTASSIUM: 3.4 mmol/L — ABNORMAL LOW (ref 3.5–5.1)
SODIUM: 147 mmol/L — ABNORMAL HIGH (ref 136–145)

## 2018-07-09 LAB — POC BLOOD GLUCOSE (RESULTS)
GLUCOSE, POC: 122 mg/dL — ABNORMAL HIGH (ref 70–105)
GLUCOSE, POC: 111 mg/dL — ABNORMAL HIGH (ref 70–105)
GLUCOSE, POC: 111 mg/dl — ABNORMAL HIGH (ref 70–105)
GLUCOSE, POC: 131 mg/dL — ABNORMAL HIGH (ref 70–105)
GLUCOSE, POC: 120 mg/dL — ABNORMAL HIGH (ref 70–105)
GLUCOSE, POC: 205 mg/dL — ABNORMAL HIGH (ref 70–105)
GLUCOSE, POC: 166 mg/dL — ABNORMAL HIGH (ref 70–105)
GLUCOSE, POC: 119 mg/dL — ABNORMAL HIGH (ref 70–105)

## 2018-07-09 LAB — CBC
HCT: 27.6 % — ABNORMAL LOW (ref 34.8–46.0)
HGB: 8.7 g/dL — ABNORMAL LOW (ref 11.5–16.0)
MCH: 29.4 pg (ref 26.0–32.0)
MCH: 29.4 pg (ref 26.0–32.0)
MCHC: 31.5 g/dL (ref 31.0–35.5)
MCV: 93.2 fL (ref 78.0–100.0)
MPV: 12.2 fL (ref 8.7–12.5)
PLATELETS: 109 x10ˆ3/uL — ABNORMAL LOW (ref 150–400)
RBC: 2.96 x10ˆ6/uL — ABNORMAL LOW (ref 3.85–5.22)
RDW-CV: 15.8 % — ABNORMAL HIGH (ref 11.5–15.5)
WBC: 13.1 x10ˆ3/uL — ABNORMAL HIGH (ref 3.7–11.0)

## 2018-07-09 LAB — PHOSPHORUS: PHOSPHORUS: 4.2 mg/dL (ref 2.4–4.7)

## 2018-07-09 LAB — MAGNESIUM: MAGNESIUM: 2.2 mg/dL (ref 1.6–2.6)

## 2018-07-09 LAB — HISTORICAL SURGICAL PATHOLOGY SPECIMEN

## 2018-07-09 MED ORDER — ACETAMINOPHEN 325 MG/10.15 ML ORAL SUSPENSION
650.0000 mg | ORAL | Status: DC | PRN
Start: 2018-07-09 — End: 2018-08-08
  Administered 2018-07-09 – 2018-08-06 (×29): 650 mg via GASTROSTOMY
  Administered 2018-08-06: 0 mg via GASTROSTOMY
  Administered 2018-08-07: 650 mg via GASTROSTOMY
  Filled 2018-07-09 (×33): qty 20.3

## 2018-07-09 MED ORDER — POTASSIUM BICARBONATE-CITRIC ACID 20 MEQ EFFERVESCENT TABLET
40.0000 meq | EFFERVESCENT_TABLET | Freq: Once | ORAL | Status: AC
Start: 2018-07-09 — End: 2018-07-09
  Administered 2018-07-09: 40 meq via GASTROSTOMY
  Filled 2018-07-09: qty 2

## 2018-07-09 MED ORDER — BACITRACIN 500 UNIT/G OINTMENT TUBE
TOPICAL_OINTMENT | Freq: Two times a day (BID) | CUTANEOUS | Status: DC
Start: 2018-07-09 — End: 2018-08-08
  Administered 2018-08-07: 0 via TOPICAL
  Filled 2018-07-09: qty 15
  Filled 2018-07-09: qty 28
  Filled 2018-07-09 (×2): qty 15

## 2018-07-09 MED ADMIN — mupirocin 2 % topical ointment: @ 04:00:00 | NDC 51672131200

## 2018-07-09 MED ADMIN — SODIUM CHLORIDE 0.45 % W/ ADDITIVES: INTRAVENOUS | @ 04:00:00 | NDC 00338004304

## 2018-07-09 MED ADMIN — piperacillin-tazobactam 4.5 gram/100 mL dextrose(iso-osm) IV piggyback: TRANSDERMAL | @ 09:00:00

## 2018-07-09 NOTE — Nurses Notes (Signed)
Restraint Continuation      Patient continues to have the following condition: confused, unable to follow instructions, reaching for ett and PIVs.      Least restrictive alternatives attempted : Increase patient observation, bed-exit alarm/motion detector, call light accessible, concealed tubes/lines, decreased/removed stimulus, toileting, frequent orientation, involved patient in conversation, provided familiar/personal items, reoriented to person, place, time, and treatment, encouraged relaxation techniques, redirected behavior and assessed meds/medical problems    Patient continues to exhibit the following behaviors: uncooperative, agitated and withdrawn    The restraint continued to facilitate medical/surgical treatment to ensure safety.    The patient will continue to be evaluated and assessments documented on the flowsheet to ensure that the patient is released from the restraint at the earliest possible time

## 2018-07-09 NOTE — Progress Notes (Signed)
[facility]  NEUROSURGERY   PROGRESS NOTE      [place]lin, [name], 57 y.o. female  Date of Admission:  07/05/2018  Date of Service: 07/09/2018  Date of Birth:  1961-01-30    Referring Physician:  No ref. provider found    Post Op Day: 4 Days Post-Op S/P Procedure(s) (LRB):  CRANIOTOMY (Left)    Chief Complaint: Left tentorial meningioma  Subjective: NAEO    Vital Signs:  Temp (24hrs) Max:38.6 C (250.5 F)      Systolic (39JQB), HAL:937 , Min:99 , TKW:409     Diastolic (73ZHG), DJM:42, Min:56, Max:81    Temp  Avg: 38.2 C (100.8 F)  Min: 37.1 C (98.8 F)  Max: 38.6 C (101.5 F)  MAP (Non-Invasive)  Avg: 84.5 mmHG  Min: 71 mmHG  Max: 98 mmHG  Pulse  Avg: 94.1  Min: 79  Max: 112  Resp  Avg: 17.5  Min: 13  Max: 24  SpO2  Avg: 98.6 %  Min: 91 %  Max: 100 %  Pain Score (Numeric, Faces): Other(CPOT)    Min/Max/Avg ICP/CPP last 24hrs:   No data recorded    Today's Physical Exam:  GCS 1/6/1T  Eyes closed  FC LUE and LLE, WD RUE and RLE  PERRL  Face symmetric  +cough  Hearing grossly intact  CN 3 4 6  EOMI Unable to assess  CN 11 shrug symmetric Unable to assess  Muscle Strength Unable to assess  Unable to assess drift   -- Lumbar drain stitch in place (rapide)  -- JP drain stitch in place (rapide)  -- Incision C/D/I w/ staples    Current Medications:  acetaminophen (TYLENOL) tablet, 650 mg, Gastric (NG, OG, PEG, GT), Q4H PRN  ascorbic acid (VITAMIN C) tablet, 500 mg, Gastric (NG, OG, PEG, GT), 2x/day  bacitracin 500 units/gram topical ointment tube, , Topical, 2x/day  cefepime (MAXIPIME) 2 g in NS 100 mL IVPB, 2 g, Intravenous, Q8H  chlorhexidine gluconate (PERIDEX) 0.12% mouthwash, 15 mL, Swish & Spit, 2x/day  enoxaparin PF (LOVENOX) 40 mg/0.4 mL SubQ injection, 40 mg, Subcutaneous, Q24H  famotidine (PEPCID) tablet, 20 mg, Gastric (NG, OG, PEG, GT), 2x/day  fentaNYL (SUBLIMAZE) 50 mcg/mL injection, 25 mcg, Intravenous, Q1H PRN  hydrALAZINE (APRESOLINE) injection 10 mg, 10 mg, Intravenous, Q4H  PRN  HYDROcodone-acetaminophen (NORCO) 5-325 mg per tablet, 1 Tab, Gastric (NG, OG, PEG, GT), Q4H PRN    Or  HYDROcodone-acetaminophen (NORCO) 5-325 mg per tablet, 2 Tab, Gastric (NG, OG, PEG, GT), Q4H PRN  labetalol (TRANDATE) 5 mg/mL injection, 10 mg, Intravenous, Q1H PRN  lanolin-oxyquin-pet, hydrophil (BAG BALM) topical ointment, , Apply Topically, 2x/day PRN  levETIRAcetam (KEPPRA) tablet, 500 mg, Gastric (NG, OG, PEG, GT), 2x/day  lisinopril (PRINIVIL) tablet, 40 mg, Gastric (NG, OG, PEG, GT), Daily  metoprolol tartrate (LOPRESSOR) tablet, 25 mg, Gastric (NG, OG, PEG, GT), 2x/day  morphine 2 mg/mL injection, 2 mg, Intravenous, Q4H PRN  nicotine (NICODERM CQ) transdermal patch (mg/24 hr), 14 mg, Transdermal, Daily  NS flush syringe, 2 mL, Intracatheter, Q8HRS    And  NS flush syringe, 2-6 mL, Intracatheter, Q1 MIN PRN  nutrition protein supplement 15 g per 30 mL packet, 1 Packet, Gastric (NG, OG, PEG, GT), 2x/day  nystatin (NYSTOP) 100,000 units/g topical powder, , Apply Topically, 2x/day  ondansetron (ZOFRAN) 2 mg/mL injection, 4 mg, Intravenous, Q6H PRN  polyethylene glycol (MIRALAX) oral packet, 17 g, Gastric (NG, OG, PEG, GT), Daily  senna concentrate (SENNA) 528mg  per 66mL oral liquid, 10 mL,  Gastric (NG, OG, PEG, GT), 2x/day  SSIP insulin lispro (HUMALOG) 100 units/mL injection, 0-12 Units, Subcutaneous, Q4H PRN        I/O:  I/O last 24 hours:      Intake/Output Summary (Last 24 hours) at 07/09/2018 1815  Last data filed at 07/09/2018 1415  Gross per 24 hour   Intake 1414 ml   Output 1200 ml   Net 214 ml     I/O current shift:  12/17 0700 - 12/17 1859  In: 440 [OG:440]  Out: 650 [Urine:550; Stool:100]    Antibiotics: Date Started Date Completed   1.     2.     3.     4.       Nutrition/Residuals:  MNT PROTOCOL FOR DIETITIAN  DIET NPO - NOW EXCEPT TUBE FEEDS  ADULT TUBE FEED - BOLUS Q4H WHILE AWAKE NO MEALS, TF ONLY; OSMOLITE 1.5; NG; BOLUS; Bolus Amount: 1 can 5 times daily; Flush Tube with water (select  amt): 200 ml; Flush Frequency: Q4H    Labs  Please indicate ordered or reviewed)  Reviewed:   Lab Results Today:    Results for orders placed or performed during the hospital encounter of 07/05/18 (from the past 24 hour(s))   POC BLOOD GLUCOSE (RESULTS)   Result Value Ref Range    GLUCOSE, POC 122 (H) 70 - 105 mg/dl   BASIC METABOLIC PANEL   Result Value Ref Range    SODIUM 147 (H) 136 - 145 mmol/L    POTASSIUM 3.4 (L) 3.5 - 5.1 mmol/L    CHLORIDE 108 96 - 111 mmol/L    CO2 TOTAL 28 22 - 32 mmol/L    ANION GAP 11 4 - 13 mmol/L    CALCIUM 9.0 8.5 - 10.2 mg/dL    GLUCOSE 109 65 - 139 mg/dL    BUN 20 8 - 25 mg/dL    CREATININE 0.75 0.49 - 1.10 mg/dL    BUN/CREA RATIO 27 (H) 6 - 22    ESTIMATED GFR >60 >60 mL/min/1.73m^2   CBC   Result Value Ref Range    WBC 13.1 (H) 3.7 - 11.0 x10^3/uL    RBC 2.96 (L) 3.85 - 5.22 x10^6/uL    HGB 8.7 (L) 11.5 - 16.0 g/dL    HCT 27.6 (L) 34.8 - 46.0 %    MCV 93.2 78.0 - 100.0 fL    MCH 29.4 26.0 - 32.0 pg    MCHC 31.5 31.0 - 35.5 g/dL    RDW-CV 15.8 (H) 11.5 - 15.5 %    PLATELETS 109 (L) 150 - 400 x10^3/uL    MPV 12.2 8.7 - 12.5 fL   MAGNESIUM   Result Value Ref Range    MAGNESIUM 2.2 1.6 - 2.6 mg/dL   PHOSPHORUS   Result Value Ref Range    PHOSPHORUS 4.2 2.4 - 4.7 mg/dL   POC BLOOD GLUCOSE (RESULTS)   Result Value Ref Range    GLUCOSE, POC 111 (H) 70 - 105 mg/dl   POC BLOOD GLUCOSE (RESULTS)   Result Value Ref Range    GLUCOSE, POC 131 (H) 70 - 105 mg/dl   POC BLOOD GLUCOSE (RESULTS)   Result Value Ref Range    GLUCOSE, POC 120 (H) 70 - 105 mg/dl   POC BLOOD GLUCOSE (RESULTS)   Result Value Ref Range    GLUCOSE, POC 205 (H) 70 - 105 mg/dl   POC BLOOD GLUCOSE (RESULTS)   Result Value Ref Range    GLUCOSE, POC  166 (H) 70 - 105 mg/dl       Assessment/ Plan:  Active Hospital Problems    Diagnosis   . 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 female s/p left temporal craniotomy for left tentorial meningioma  resection s/p left craniectomy for evacuation of tumor bed hematoma. POD4/3.  -- Keppra 500 mg bid x7 days  -- Please bathe patient daily and clean wound daily                -- Okay for soap/shampoo                -- Apply bacitracin ointment BID, cleaning old ointment off before application of new ointment  -- Imaging:                 CT brain w/o (07/08/18): stable                MRI brain w/wo (07/06/18): Acute postoperative changes related to gross total resection of a left medial tentorial meningioma with previously seen mass effect on the left thalamus, left middle cerebral peduncle and left midbrain.  Hemorrhagic products are identified within the resection cavity extending into the parenchyma of the left thalamus and tracking down within the left midbrain and extending to the fourth ventricle.                CT brain w/o (07/06/18): Postoperative changes from left decompressive craniectomy for evacuation of hematoma of tumor bed with residual hemorrhage and improved edema and rightward midline shift.                CT brain w/o (07/06/18): Postoperative changes from decompressive craniectomy for evacuation of tumor bed hematoma with minimal residual blood at the surgical site.  -- Pain/spasm control: Tylenol PRN, Norco PRN  -- Diet: NPO + Osmolite TF  -- Bowel regimen, last BM 07/08/18  -- Abx: Cefepime (empiric)                -- Blood culture (07/07/18): NGTD                -- Urine culture (07/07/18): NGTD  -- Activity: ambulate as tolerated w/ assist  -- PT/OT: ordered   -- DVT ppx: SCDs/Venodynes & Lovenox  -- Consults: None  -- Lines/Drains: OG, ETT  -- Wound: cranial staples  -- Disposition: ongoing    Hipolito Bayley, MD PhD      I saw and examined the patient.  I reviewed the resident's note.  I agree with the findings and plan of care as documented in the resident's note.  Any exceptions/additions are edited/noted.    [name], MD

## 2018-07-09 NOTE — Nurses Notes (Signed)
Restraint Continuation      Patient continues to have the following condition: withdrawn, unable to follow instructions, pulling at ett and other lines.      Least restrictive alternatives attempted : Increase patient observation, bed-exit alarm/motion detector, call light accessible, concealed tubes/lines, decreased/removed stimulus, toileting, frequent orientation, involved patient in conversation, provided familiar/personal items, reoriented to person, place, time, and treatment, encouraged relaxation techniques, redirected behavior and assessed meds/medical problems    Patient continues to exhibit the following behaviors: uncooperative and withdrawn    The restraint continued to facilitate medical/surgical treatment to ensure safety.    The patient will continue to be evaluated and assessments documented on the flowsheet to ensure that the patient is released from the restraint at the earliest possible time

## 2018-07-09 NOTE — Nurses Notes (Signed)
Telemetry called and stated that patient had 14.39 second run of SVT. NCCU service notified and stated that EKG will be ordered.

## 2018-07-09 NOTE — Care Plan (Signed)
[place]  Physical Therapy Initial Evaluation    Patient Name: [name]  Date of Birth: 28-Nov-1960  Height: Height: 167.6 cm (5' 5.98")  Weight: Weight: 86.4 kg (190 lb 7.6 oz)  Room/Bed: 11/A  Payor: HEALTH PLAN / Plan: HEALTH PLAN (PPO-NON ST EMP) / Product Type: PPO /     Assessment:      (P) [name] was less responsive today; RN states staff think this may be secondary to overstimulation and need for more " down time." PT will follow and advance activity as status allows.    Discharge Needs:    Equipment Recommendation: (P) TBD     Discharge Disposition: (P) TBD, inpatient rehabilitation facility, skilled nursing facility    JUSTIFICATION OF DISCHARGE RECOMMENDATION   Based on current diagnosis, functional performance prior to admission, and current functional performance, this patient requires continued PT services in (P) TBD, inpatient rehabilitation facility, skilled nursing facility in order to achieve significant functional improvements in these deficit areas: (P) aerobic capacity/endurance, arousal, attention, and cognition, gait, locomotion, and balance, neuromuscular, ventilation and respiration/gas exchange.      Plan:   Current Intervention:    To provide physical therapy services (P) minimum of 2x/week  for duration of (P) until discharge.    The risks/benefits of therapy have been discussed with the patient/caregiver and he/she is in agreement with the established plan of care.     Past Medical History:   Diagnosis Date   . Anxiety    . Arthropathy    . Cancer (CMS HCC)     brain tumor   . Dyspnea on exertion    . Esophageal reflux     does not take meds   . Heart murmur     benign   . HTN (hypertension)    . Hyperlipemia    . Muscle weakness     right sided weakness   . Palpitations    . Shortness of breath    . Wears glasses     reading         Past Surgical History:   Procedure Laterality Date   . HX APPENDECTOMY     . HX BREAST AUGMENTATION  Bilateral    . HX LAP CHOLECYSTECTOMY     . HX LUMBAR DISKECTOMY  1999   . HX OTHER Right     benign tumor removed from upper right leg x3   . HX OTHER      exploratory lap   . HX TUBAL LIGATION            reports that she has been smoking cigarettes. She has a 30.00 pack-year smoking history. She has never used smokeless tobacco. She reports that she does not drink alcohol or use drugs.  Social History     Tobacco Use   Smoking Status Current Every Day Smoker   . Packs/day: 1.00   . Years: 30.00   . Pack years: 30.00   . Types: Cigarettes   Smokeless Tobacco Never Used   Tobacco Comment    down to 8 cigarettes, counseled on [email or web address]-QUIT_NOW         Subjective & Objective        07/09/18 1616   Therapist Pager   PT Assigned/ Pager # [phone]   Rehab Session   Document Type therapy progress note (daily note)   Total PT Minutes: 14   Symptoms Noted During/After Treatment none   General Information   Medical  Lines Arterial Line;Foley Catheter;PIV Line;Restraints;Telemetry   Respiratory Status ventilator   Existing Precautions/Restrictions aspiration precautions;fall precautions;full code   Self-Care   Current Activity Tolerance poor   Pre Treatment Status   Pre Treatment Patient Status Patient supine in bed;Nurse approved session;Restraints in place (specify in comments)  (B wrist restraints)   Support Present Pre Treatment  None   Communication Pre Treatment  Nurse   Communication Pre Treatment Comment Spoke to RN earlier today: RN concerned re ? decline in function, and PT deferred pending head CT. Per RN and per radiology report, no change note in head scan and RN OK's intervention   Cognitive Assessment/Interventions   Behavior/Mood Observations unable to arouse   Orientation Status unable/difficult to assess   Attention severe impairment   Follows Commands does not follow one step commands   Comment Eyes swollen shut. RN reports earlier this morning patient had been following a few 1 step commands   Vital Signs      Vitals Comment VSS and on continuous monitoring   Pain Assessment   Pre/Post Treatment Pain Comment startle and generalized response L UE/LE when a cold cloth was place over eyes but no behavior suggested pain;    Therapeutic Exercise/Activity   Comment PROM x 4 limbs. Tone slightly increased RUE ( biceps and pectorals) . No indication of patient " following " /assisting with ROM any limbs. Patient maintains full PROM al major joints all limbs   Post Treatment Status   Post Treatment Patient Status Patient supine in bed;Returned to room via transport   Support Present Post Treatment  Nurse present   Communication [place] With patient   Basic Mobility Am-PAC/6Clicks Score   Turning in bed without bedrails 1   Lying on back to sitting on edge of flat bed 1   Moving to and from a bed to a chair 1   Standing up from chair 1   Walk in room 1   Climbing 3-5 steps with railing 1   6 Clicks Raw Score total 6   Standardized (t-scale) score 16.59   CMS 0-100% Score 100   CMS Modifier CN   Patient Mobility Goal (JHHLM) 2- Perform PROM 2X/day   Exercise/Activity Level Performed 2- Turned self in bed/ROM (active or passive)   Physical Therapy Clinical Impression   Assessment [name] was less responsive today; RN states staff think this may be secondary to overstimulation and need for more " down time." PT will follow and advance activity as status allows.   Criteria for Skilled Therapeutic meets criteria   Pathology/Pathophysiology Noted neuromuscular;other (see comments)  (cognitive, potentially somatosensory, visual )   Impairments Found (describe specific impairments) aerobic capacity/endurance;arousal, attention, and cognition;gait, locomotion, and balance;neuromuscular;ventilation and respiration/gas exchange   Functional Limitations in Following  self-care;home management;work;community/leisure   Disability: Inability to Perform work;community/leisure    Rehab Potential fair, will monitor progress closely   Therapy Frequency minimum of 2x/week   Predicted Duration of Therapy Intervention (days/wks) until discharge   Anticipated Equipment Needs at Discharge (PT) TBD   Anticipated Discharge Disposition TBD;inpatient rehabilitation facility;skilled nursing facility         Therapist:   Cherlyn Labella, PT   Pager #: [phone]

## 2018-07-09 NOTE — Care Plan (Signed)
Problem: Adult Inpatient Plan of Care  Goal: Plan of Care Review  Outcome: Ongoing (see interventions/notes)  Goal: Patient-Specific Goal (Individualization)  Outcome: Ongoing (see interventions/notes)  Flowsheets (Taken 07/09/2018 1522)  Individualized Care Needs: Encourage patient to open eyes.  Patient-Specific Goals (Include Timeframe): Patient will maintain temperature below 101.5 by end of shift.  Goal: Absence of Hospital-Acquired Illness or Injury  Outcome: Ongoing (see interventions/notes)  Goal: Optimal Comfort and Wellbeing  Outcome: Ongoing (see interventions/notes)  Goal: Rounds/Family Conference  Outcome: Ongoing (see interventions/notes)     Problem: Communication Impairment (Mechanical Ventilation, Invasive)  Goal: Effective Communication  Outcome: Ongoing (see interventions/notes)     Problem: Device-Related Complication Risk (Mechanical Ventilation, Invasive)  Goal: Optimal Device Function  Outcome: Ongoing (see interventions/notes)     Problem: Inability to Wean (Mechanical Ventilation, Invasive)  Goal: Mechanical Ventilation Liberation  Outcome: Ongoing (see interventions/notes)     Problem: Nutrition Impairment (Mechanical Ventilation, Invasive)  Goal: Optimal Nutrition Delivery  Outcome: Ongoing (see interventions/notes)     Problem: Skin and Tissue Injury (Mechanical Ventilation, Invasive)  Goal: Absence of Device-Related Skin and Tissue Injury  Outcome: Ongoing (see interventions/notes)     Problem: Ventilator-Induced Lung Injury (Mechanical Ventilation, Invasive)  Goal: Absence of Ventilator-Induced Lung Injury  Outcome: Ongoing (see interventions/notes)     Problem: Fall Injury Risk  Goal: Absence of Fall and Fall-Related Injury  Outcome: Ongoing (see interventions/notes)     Problem: Skin Injury Risk Increased  Goal: Skin Health and Integrity  Outcome: Ongoing (see interventions/notes)     Problem: Non-violent/Non-Self Destructive Restraints  Goal: Alternative methods tried prior to  restraints  Outcome: Ongoing (see interventions/notes)  Goal: Patient free from injury and discomfort  Outcome: Ongoing (see interventions/notes)  Goal: Autonomy maintained at the highest possible level  Outcome: Ongoing (see interventions/notes)  Goal: Need for restraints reassessed per policy  Outcome: Ongoing (see interventions/notes)  Goal: Patient education provided  Outcome: Ongoing (see interventions/notes)  Goal: Problem Interventions  Outcome: Ongoing (see interventions/notes)     Problem: Hypertension Comorbidity  Goal: Blood Pressure in Desired Range  Outcome: Ongoing (see interventions/notes)     Problem: Depression  Goal: Improved Mood  Outcome: Ongoing (see interventions/notes)     Problem: Feeding Intolerance (Enteral Nutrition)  Goal: Feeding Tolerance  Outcome: Ongoing (see interventions/notes)   Patient intubated at this time and unable to verbalize at this time. Continues to require oral suctioning throughout the day due to increase secretions. Receiving Tylenol 650 mg via tube for increased temperature. Turned and repositioned every two hours and as necessary. Flexi seal intact. Patient straight cathed x1 for 550 ml. Currently showing on signs or symptoms of distress. Will continue to monitor.

## 2018-07-09 NOTE — Care Plan (Signed)
Patient is on minimal PSV settings of 8/5 and 30%. No weaning parameters done per Hilda Blades NP.

## 2018-07-09 NOTE — Progress Notes (Signed)
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   GCS: 1/5/1T ; patient localized in the LUE, withdraws in the rest   No adverse events overnight.   Patient tmax 38.5, cultures -ve so for, patient was started on Cefepime day 2.      Vital Signs:  Temp (24hrs) Max:38.6 C (154.0 F)      Systolic (08QPY), PPJ:093 , Min:99 , OIZ:124     Diastolic (58KDX), IPJ:82, Min:56, Max:84    Temp  Avg: 38.1 C (100.6 F)  Min: 37.1 C (98.8 F)  Max: 38.6 C (101.5 F)  MAP (Non-Invasive)  Avg: 89.3 mmHG  Min: 71 mmHG  Max: 103 mmHG  Pulse  Avg: 91.7  Min: 79  Max: 112  Resp  Avg: 17.6  Min: 13  Max: 22  SpO2  Avg: 98.6 %  Min: 91 %  Max: 100 %  Pain Score (Numeric, Faces): Other(CPOT)    Current Medications:  acetaminophen (TYLENOL) tablet, 650 mg, Gastric (NG, OG, PEG, GT), Q4H PRN  ascorbic acid (VITAMIN C) tablet, 500 mg, Gastric (NG, OG, PEG, GT), 2x/day  cefepime (MAXIPIME) 2 g in NS 100 mL IVPB, 2 g, Intravenous, Q8H  chlorhexidine gluconate (PERIDEX) 0.12% mouthwash, 15 mL, Swish & Spit, 2x/day  enoxaparin PF (LOVENOX) 40 mg/0.4 mL SubQ injection, 40 mg, Subcutaneous, Q24H  famotidine (PEPCID) tablet, 20 mg, Gastric (NG, OG, PEG, GT), 2x/day  fentaNYL (SUBLIMAZE) 50 mcg/mL injection, 25 mcg, Intravenous, Q1H PRN  hydrALAZINE (APRESOLINE) injection 10 mg, 10 mg, Intravenous, Q4H PRN  HYDROcodone-acetaminophen (NORCO) 5-325 mg per tablet, 1 Tab, Gastric (NG, OG, PEG, GT), Q4H PRN    Or  HYDROcodone-acetaminophen (NORCO) 5-325 mg per tablet, 2 Tab, Gastric (NG, OG, PEG, GT), Q4H PRN  labetalol (TRANDATE) 5 mg/mL injection, 10 mg, Intravenous, Q1H PRN  lanolin-oxyquin-pet,  hydrophil (BAG BALM) topical ointment, , Apply Topically, 2x/day PRN  levETIRAcetam (KEPPRA) tablet, 500 mg, Gastric (NG, OG, PEG, GT), 2x/day  lisinopril (PRINIVIL) tablet, 40 mg, Gastric (NG, OG, PEG, GT), Daily  metoprolol tartrate (LOPRESSOR) tablet, 25 mg, Gastric (NG, OG, PEG, GT), 2x/day  morphine 2 mg/mL injection, 2 mg, Intravenous, Q4H PRN  nicotine (NICODERM CQ) transdermal patch (mg/24 hr), 14 mg, Transdermal, Daily  NS flush syringe, 2 mL, Intracatheter, Q8HRS    And  NS flush syringe, 2-6 mL, Intracatheter, Q1 MIN PRN  nutrition protein supplement 15 g per 30 mL packet, 1 Packet, Gastric (NG, OG, PEG, GT), 2x/day  nystatin (NYSTOP) 100,000 units/g topical powder, , Apply Topically, 2x/day  ondansetron (ZOFRAN) 2 mg/mL injection, 4 mg, Intravenous, Q6H PRN  polyethylene glycol (MIRALAX) oral packet, 17 g, Gastric (NG, OG, PEG, GT), Daily  senna concentrate (SENNA) 540m per 153moral liquid, 10 mL, Gastric (NG, OG, PEG, GT), 2x/day  SSIP insulin lispro (HUMALOG) 100 units/mL injection, 0-12 Units, Subcutaneous, Q4H PRN      Today's Physical Exam:   General: Appears stated age, up in chair with helmet  Neurologic:   GCS E1 V1T M5= 7T, intubated, PERRL, gaze midline, no facial weakness; Motor LUE localize, RUE flexion w/d, BLE flexion w/d  Cardiovascular:    Heart regular rate and rhythm  Lungs: diminished bilaterally  Abdomen: soft, non-tender and bowel sounds normal  Extremities: no cyanosis or edema  Skin: Skin warm and dry    Labs:  I have reviewed all lab results.    I/O:  I/O last 24 hours:      Intake/Output Summary (Last 24 hours) at 07/09/2018 0925  Last data filed at 07/09/2018 0500  Gross per 24 hour   Intake 1174 ml   Output 2060 ml   Net -886 ml     I/O current shift:  No intake/output data recorded.    Drips:   Current Facility-Administered Medications   Medication Dose Frequency Last Rate         Radiology Results:  MRI brain w/wo contrast 07/06/18: Pending final read    Other Testing:      See below    Microbiology:   See below       Impression and Plan:    Active Hospital Problems    Diagnosis   . 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       Neurologic:  L Tentorial meningioma s/p craniotomy for tumor resection, s/p evacuation of post-op hemorrhage; POD#2/POD#1  -- [facility] neuro checks as patient is at risk for acute neurological decline  -- MRI brain w/wo contrast 07/06/18: Post-op changes following resection, concern for infarct left midbrain, will f/u final read  - JP drain removed on 07/07/18  - Lumbar drain removed on 07/06/18  -- CT brain 07/08/18 : mild increase in cerebral edema   -- Seizure prophylaxis with Keppra 500 mg BID x 7 days, end 07/12/18  -- STAT CT brain without contrast for any acute change in neurological status     Pain/sedation  -- Tylenol PRN  -- Fentanyl PRN  -- Norco PRN  -- Morphine PRN      Cardiovascular:    HTN, chronic  -BP 127/47, HR 79  -- SBP goal < 140  -- Hydralazine/labetalol (x1 overnight) PRN  -- Current antihypertensives:  -- Metoprolol 25 mg BID    Respiratory:    Acute respiratory failure requiring mechanical ventilation  -Ventilator Settings:  Set PEEP: 5 cmH2O  Pressure Support: 8 cmH2O  FiO2: 30 %   SpO2 goal > 94%  -- CXR on 07/06/18: No acute process  -- Weaning parameters ordered for 12/16 AM    Renal:  Hypernatremia/hyperchloremia, may be 2/2 intravascular depletion  -- 1.8L free water deficit, FWF 200 cc Q6H    hypophosphatemia  -- Repleted    Infectious Disease:   Leukocytosis-improving could be reactive   -- Tmax 38.4  -- Cultures:    -- [facility] 07/07/18:   -ve at day 2   -- Urine culture 07/07/18:No UTI    -- MRSA screen 07/07/18: Negative   -- BAL 07/07/18: Ordered  -- Current meds: cefepime 2 g q8 hours day 2      Hematology:Normocytic anemia   -h/h today 8.7/27.6  -could be surgery associated   -will do anemia workup   -continue to monitor and transfuse if hb<7    Thrombocytopenia, acute could be  dilutional   -Platelete count dropped from 256 to 114 over two days; today 109  -continue to monitor daily     DVT prophylaxis  lovenox       Gastrointestinal:    At risk for constipation  -last BM this morning   -- Miralax/senna scheduled  -- LBM  PTA      Endocrine:  No acute issues  -- SSIP conservative      Psychiatric:  No acute issues  -- Monitor for ICU delirium       Prophylaxis:    DVT/PE Prophylaxis: SCDs/ Venodynes/Impulse boots  GI: Famotidine  Seizure: Keppra     Family:  None present at bedside  Disposition/ Baseline:  ICU  Advance Directives:  None-Discussed  Hospice involvement prior to admission?  no    Greggory Brandy, MD   [facility] neurology PGY-2   Pager [phone]      I saw and examined the patient on 07/09/18.  I reviewed the resident's note.  I agree with the findings including assessment and plan as documented in the resident's note.  Any exceptions/additions are noted .    The following organ systems are impaired causing an immanent threat to his life or potentially causing his condition to deteriorate.  The treatment of these conditions required highly complex decision making to manipulate and support his organ damage.     Neurological- s/p large tentorial meningioma resection complicated by hemorrhage and return to OR for craniectomy.  Neurologically steadily improving.  Intermittently following commands now.   Cardiovascular-goal SBP< 140.  1 PRN overnight   Pulmonological-acute respiratory failure requiring mechanical ventilation  Gastro-intestinal-dysphagia, Nutrition supported while patient with dysphagia and remains intubated/unable to swallow to prevent malnutrition.  268m q4h free H2O flushes   Genitourinary-plan to d/c foley today.   Infectious disease- cefepime day 2/7 for empiric coverage of Pneumonia     The patients disposition is ICU     Critical Care Time: 30  minutes    NMerilyn Baba MD

## 2018-07-09 NOTE — Respiratory Therapy (Signed)
07/06/18 0400  VENTILATOR - CPAP(PS) / SPONTANEOUS CONTINUOUS Discontinue   Duration: Until Specified    Priority: Routine       Question Answer Comment   FIO2 (%) 30    Peep(cm/H2O) 5    Pressure Support(cm/H2O) 8    Indications IMPROVE DISTRIBUTION OF VENTILATION    Invasive/Non-Invasive INVASIVE            PT on settings above. No other changes have been made. PT went for a CT scan today. Suctioning moderate amount of oral secretions. RT will continue to monitor.

## 2018-07-09 NOTE — Progress Notes (Signed)
[facility]  NEUROSURGERY   PROGRESS NOTE      Horger, Phinley, 57 y.o. female  Date of Admission:  07/05/2018  Date of Service: 07/10/2018  Date of Birth:  1961-02-11    Referring Physician:  No ref. provider found    Post Op Day: 5 Days Post-Op S/P Procedure(s) (LRB):  CRANIOTOMY (Left)    Chief Complaint: Left tentorial meningioma  Subjective: Following commands yesterday at times per nursing.  Much more awake and easily stimulated than prior.    Vital Signs:  Temp (24hrs) Max:38.6 C (287.8 F)      Systolic (67EHM), CNO:709 , Min:103 , GGE:366     Diastolic (29UTM), LYY:50, Min:56, Max:89    Temp  Avg: 38.4 C (101.1 F)  Min: 38 C (100.4 F)  Max: 38.6 C (101.5 F)  MAP (Non-Invasive)  Avg: 85.5 mmHG  Min: 71 mmHG  Max: 104 mmHG  Pulse  Avg: 96.7  Min: 82  Max: 124  Resp  Avg: 18.3  Min: 0  Max: 28  SpO2  Avg: 97.6 %  Min: 91 %  Max: 100 %  Pain Score (Numeric, Faces): Other(CPOT)    Min/Max/Avg ICP/CPP last 24hrs:   No data recorded    Today's Physical Exam:  Appears stated age, NAD  GCS 1/5/1T  Eyes closed  LOC LUE, WD RUE and BLE  PERRL  Face symmetric  +cough  Hearing grossly intact    Unable to assess speech  Unable to assess fund of knowledge  Unable to assess attention span & concentration  Unable to assess recent and remote memory  CN 3 4 6  EOMI Unable to assess  CN 11 shrug symmetric Unable to assess  Muscle Strength Unable to assess  Unable to assess drift   -- Lumbar drain stitch in place (rapide)  -- JP drain stitch in place (rapide)  -- Incision C/D/I w/ staples    Current Medications:  acetaminophen (TYLENOL) 160 mg per 5 mL liquid - grape flavored, 650 mg, Gastric (NG, OG, PEG, GT), Q4H PRN  ascorbic acid (VITAMIN C) tablet, 500 mg, Gastric (NG, OG, PEG, GT), 2x/day  bacitracin 500 units/gram topical ointment tube, , Topical, 2x/day  cefepime (MAXIPIME) 2 g in NS 100 mL IVPB, 2 g, Intravenous, Q8H  chlorhexidine gluconate (PERIDEX) 0.12% mouthwash, 15 mL, Swish & Spit,  2x/day  enoxaparin PF (LOVENOX) 40 mg/0.4 mL SubQ injection, 40 mg, Subcutaneous, Q24H  famotidine (PEPCID) tablet, 20 mg, Gastric (NG, OG, PEG, GT), 2x/day  fentaNYL (SUBLIMAZE) 50 mcg/mL injection, 25 mcg, Intravenous, Q1H PRN  hydrALAZINE (APRESOLINE) injection 10 mg, 10 mg, Intravenous, Q4H PRN  HYDROcodone-acetaminophen (NORCO) 5-325 mg per tablet, 1 Tab, Gastric (NG, OG, PEG, GT), Q4H PRN    Or  HYDROcodone-acetaminophen (NORCO) 5-325 mg per tablet, 2 Tab, Gastric (NG, OG, PEG, GT), Q4H PRN  labetalol (TRANDATE) 5 mg/mL injection, 10 mg, Intravenous, Q1H PRN  lanolin-oxyquin-pet, hydrophil (BAG BALM) topical ointment, , Apply Topically, 2x/day PRN  levETIRAcetam (KEPPRA) tablet, 500 mg, Gastric (NG, OG, PEG, GT), 2x/day  lisinopril (PRINIVIL) tablet, 40 mg, Gastric (NG, OG, PEG, GT), Daily  metoprolol tartrate (LOPRESSOR) tablet, 25 mg, Gastric (NG, OG, PEG, GT), 2x/day  morphine 2 mg/mL injection, 2 mg, Intravenous, Q4H PRN  nicotine (NICODERM CQ) transdermal patch (mg/24 hr), 14 mg, Transdermal, Daily  NS flush syringe, 2 mL, Intracatheter, Q8HRS    And  NS flush syringe, 2-6 mL, Intracatheter, Q1 MIN PRN  nutrition protein supplement 15 g per 30 mL  packet, 1 Packet, Gastric (NG, OG, PEG, GT), 2x/day  nystatin (NYSTOP) 100,000 units/g topical powder, , Apply Topically, 2x/day  ondansetron (ZOFRAN) 2 mg/mL injection, 4 mg, Intravenous, Q6H PRN  polyethylene glycol (MIRALAX) oral packet, 17 g, Gastric (NG, OG, PEG, GT), Daily  senna concentrate (SENNA) 528mg  per 80mL oral liquid, 10 mL, Gastric (NG, OG, PEG, GT), 2x/day  SSIP insulin lispro (HUMALOG) 100 units/mL injection, 0-12 Units, Subcutaneous, Q4H PRN        I/O:  I/O last 24 hours:      Intake/Output Summary (Last 24 hours) at 07/10/2018 0416  Last data filed at 07/10/2018 0400  Gross per 24 hour   Intake 1617 ml   Output 675 ml   Net 942 ml     I/O current shift:  12/17 1900 - 12/18 0659  In: 537 [OG:537]  Out: 25  [Stool:25]    Nutrition/Residuals:  MNT PROTOCOL FOR DIETITIAN  DIET NPO - NOW EXCEPT TUBE FEEDS  ADULT TUBE FEED - BOLUS Q4H WHILE AWAKE NO MEALS, TF ONLY; OSMOLITE 1.5; NG; BOLUS; Bolus Amount: 1 can 5 times daily; Flush Tube with water (select amt): 200 ml; Flush Frequency: Q4H    Labs  Please indicate ordered or reviewed)  Reviewed:   Lab Results Today:    Results for orders placed or performed during the hospital encounter of 07/05/18 (from the past 24 hour(s))   ECG 12-LEAD   Result Value Ref Range    Ventricular rate 95 BPM    Atrial Rate 95 BPM    PR Interval 120 ms    QRS Duration 90 ms    QT Interval 360 ms    QTC Calculation 452 ms    Calculated P Axis 19 degrees    Calculated R Axis -2 degrees    Calculated T Axis -6 degrees   POC BLOOD GLUCOSE (RESULTS)   Result Value Ref Range    GLUCOSE, POC 120 (H) 70 - 105 mg/dl   POC BLOOD GLUCOSE (RESULTS)   Result Value Ref Range    GLUCOSE, POC 205 (H) 70 - 105 mg/dl   POC BLOOD GLUCOSE (RESULTS)   Result Value Ref Range    GLUCOSE, POC 166 (H) 70 - 105 mg/dl   POC BLOOD GLUCOSE (RESULTS)   Result Value Ref Range    GLUCOSE, POC 119 (H) 70 - 105 mg/dl   BASIC METABOLIC PANEL   Result Value Ref Range    SODIUM 144 136 - 145 mmol/L    POTASSIUM 3.1 (L) 3.5 - 5.1 mmol/L    CHLORIDE 107 96 - 111 mmol/L    CO2 TOTAL 28 22 - 32 mmol/L    ANION GAP 9 4 - 13 mmol/L    CALCIUM 9.0 8.5 - 10.2 mg/dL    GLUCOSE 108 65 - 139 mg/dL    BUN 18 8 - 25 mg/dL    CREATININE 0.64 0.49 - 1.10 mg/dL    BUN/CREA RATIO 28 (H) 6 - 22    ESTIMATED GFR >60 >60 mL/min/1.13m^2   CBC   Result Value Ref Range    WBC 10.0 3.7 - 11.0 x10^3/uL    RBC 2.65 (L) 3.85 - 5.22 x10^6/uL    HGB 7.8 (L) 11.5 - 16.0 g/dL    HCT 24.2 (L) 34.8 - 46.0 %    MCV 91.3 78.0 - 100.0 fL    MCH 29.4 26.0 - 32.0 pg    MCHC 32.2 31.0 - 35.5 g/dL    RDW-CV 15.4  11.5 - 15.5 %    PLATELETS 127 (L) 150 - 400 x10^3/uL    MPV 11.7 8.7 - 12.5 fL   MAGNESIUM   Result Value Ref Range    MAGNESIUM 2.0 1.6 - 2.6 mg/dL    PHOSPHORUS   Result Value Ref Range    PHOSPHORUS 3.3 2.4 - 4.7 mg/dL       Assessment/ Plan:  Active Hospital Problems    Diagnosis   . 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 female s/p left temporal craniotomy for left tentorial meningioma resection s/p left craniectomy for evacuation of tumor bed hematoma. POD5/4.  -- Keppra 500 mg BID x 7 days; end-date 07/12/18  -- Please bathe patient daily and clean wound daily   -- Okay for soap/shampoo   -- Apply bacitracin ointment BID, cleaning old ointment off before application of new ointment    -- Imaging:    CT brain w/o (07/09/18 @ 1328): Stable postoperative changes from left decompressive craniectomy with persistent mild extracalvarial brain herniation.  Persistent cerebral edema and hemorrhage at the tumor resection site.  No new area of hemorrhage.   CT brain w/o (07/09/18 @ 0430): Stable postoperative changes from left decompressive craniectomy with persistent mild extracalvarial brain herniation through craniectomy site.  Unchanged cerebral edema and hemorrhage at the tumor resection site.   CT brain w/o (07/08/18): Postoperative changes from decompressive craniectomy with interval mild extracalvarial brain herniation through the craniectomy site.  Mild increase in cerebral edema at the tumor resection bed and adjacent parenchyma without significant increase in hemorrhage.   MRI brain w/wo (07/06/18): Acute postoperative changes related to gross total resection of a left medial tentorial meningioma with previously seen mass effect on the left thalamus, left middle cerebral peduncle and left midbrain.  Hemorrhagic products are identified within the resection cavity extending into the parenchyma of the left thalamus and tracking down within the left midbrain and extending to the fourth ventricle.   CT brain w/o (07/06/18): Postoperative changes from left decompressive  craniectomy for evacuation of hematoma of tumor bed with residual hemorrhage and improved edema and rightward midline shift.   CT brain w/o (07/06/18): Postoperative changes from decompressive craniectomy for evacuation of tumor bed hematoma with minimal residual blood at the surgical site.  -- Pain/spasm control: Tylenol PRN, Norco PRN  -- Diet: NPO + Osmolite TF  -- Bowel regimen, last BM 07/09/18  -- Abx: Cefepime (empiric)   -- Blood culture (07/07/18): NGTD   -- Urine culture (07/07/18): NGTD  -- Activity: ambulate as tolerated w/ assist   -- Helmet at all times when out of bed  -- PT/OT: Acute Rehab  -- DVT ppx: SCDs/Venodynes & Lovenox  -- Consults: None  -- Lines/Drains: Foley, OG, ETT  -- Wound: cranial staples  -- Disposition: ongoing    Reola Calkins, MD, PhD  Department of Neurosurgery  [id number]      I saw and examined the patient.  I reviewed the resident's note.  I agree with the findings and plan of care as documented in the resident's note.  Any exceptions/additions are edited/noted.    [name], MD

## 2018-07-09 NOTE — Nurses Notes (Signed)
Restraint Continuation      Patient continues to have the following condition AMS.       Least restrictive alternatives attempted : Considered 1:1 sit, Provided 1:1 observation, Increase patient observation, moved closer to the nurses station, bed-exit alarm/motion detector, call light accessible, concealed tubes/lines, frequent orientation and encouraged relaxation techniques    Patient continues to exhibit the following behaviors: uncooperative and agitated    The restraint continued to facilitate medical/surgical treatment to ensure safety.    The patient will continue to be evaluated and assessments documented on the flowsheet to ensure that the patient is released from the restraint at the earliest possible time

## 2018-07-09 NOTE — Nurses Notes (Signed)
Patient had been following commands with previous assessments but did not follow commands and only localized pain to left extremities and had withdraw on right extremities. [place] notified.

## 2018-07-10 DIAGNOSIS — F17200 Nicotine dependence, unspecified, uncomplicated: Secondary | ICD-10-CM

## 2018-07-10 DIAGNOSIS — E876 Hypokalemia: Secondary | ICD-10-CM

## 2018-07-10 LAB — POC BLOOD GLUCOSE (RESULTS)
GLUCOSE, POC: 159 mg/dL — ABNORMAL HIGH (ref 70–105)
GLUCOSE, POC: 121 mg/dL — ABNORMAL HIGH (ref 70–105)
GLUCOSE, POC: 159 mg/dL — ABNORMAL HIGH (ref 70–105)
GLUCOSE, POC: 138 mg/dL — ABNORMAL HIGH (ref 70–105)
GLUCOSE, POC: 111 mg/dL — ABNORMAL HIGH (ref 70–105)

## 2018-07-10 LAB — VENOUS BLOOD GAS
%FIO2 (VENOUS): 30 %
BASE EXCESS: 4.5 mmol/L — ABNORMAL HIGH (ref ?–3.0)
BICARBONATE (VENOUS): 28.1 mmol/L — ABNORMAL HIGH (ref 22.0–26.0)
PCO2 (VENOUS): 39 mmHg — ABNORMAL LOW (ref 41.00–51.00)
PH (VENOUS): 7.47 — ABNORMAL HIGH (ref 7.31–7.41)
PO2 (VENOUS): 47 mmHg (ref 35.0–50.0)

## 2018-07-10 LAB — VENOUS BLOOD GAS/LACTATE
%FIO2 (VENOUS): 30 %
%FIO2 (VENOUS): 30 %
BASE EXCESS: 5.6 mmol/L — ABNORMAL HIGH (ref ?–3.0)
BASE EXCESS: 4.6 mmol/L — ABNORMAL HIGH (ref ?–3.0)
BICARBONATE (VENOUS): 29.2 mmol/L — ABNORMAL HIGH (ref 22.0–26.0)
BICARBONATE (VENOUS): 28.2 mmol/L — ABNORMAL HIGH (ref 22.0–26.0)
LACTATE: 1.9 mmol/L — ABNORMAL HIGH (ref 0.0–1.3)
LACTATE: 1.1 mmol/L (ref 0.0–1.3)
PCO2 (VENOUS): 36 mmHg — ABNORMAL LOW (ref 41.00–51.00)
PCO2 (VENOUS): 38 mmHg — ABNORMAL LOW (ref 41.00–51.00)
PH (VENOUS): 7.51 (ref 7.31–7.41)
PH (VENOUS): 7.48 — ABNORMAL HIGH (ref 7.31–7.41)
PO2 (VENOUS): 63 mmHg — ABNORMAL HIGH (ref 35.0–50.0)
PO2 (VENOUS): 48 mmHg (ref 35.0–50.0)

## 2018-07-10 LAB — MAGNESIUM
MAGNESIUM: 2 mg/dL (ref 1.6–2.6)
MAGNESIUM: 2 mg/dL (ref 1.6–2.6)
MAGNESIUM: 1.9 mg/dL (ref 1.6–2.6)

## 2018-07-10 LAB — BASIC METABOLIC PANEL
ANION GAP: 9 mmol/L (ref 4–13)
ANION GAP: 8 mmol/L (ref 4–13)
BUN/CREA RATIO: 28 — ABNORMAL HIGH (ref 6–22)
BUN/CREA RATIO: 25 — ABNORMAL HIGH (ref 6–22)
BUN: 18 mg/dL (ref 8–25)
BUN: 16 mg/dL (ref 8–25)
CALCIUM: 9 mg/dL (ref 8.5–10.2)
CALCIUM: 8.6 mg/dL (ref 8.5–10.2)
CHLORIDE: 107 mmol/L (ref 96–111)
CHLORIDE: 106 mmol/L (ref 96–111)
CO2 TOTAL: 28 mmol/L (ref 22–32)
CO2 TOTAL: 28 mmol/L (ref 22–32)
CREATININE: 0.64 mg/dL (ref 0.49–1.10)
CREATININE: 0.65 mg/dL (ref 0.49–1.10)
ESTIMATED GFR: >60 mL/min/1.73mˆ2 (ref >60)
ESTIMATED GFR: >60 mL/min/1.73mˆ2 (ref >60)
GLUCOSE: 108 mg/dL (ref 65–139)
GLUCOSE: 131 mg/dL (ref 65–139)
POTASSIUM: 3.1 mmol/L — ABNORMAL LOW (ref 3.5–5.1)
POTASSIUM: 3.4 mmol/L — ABNORMAL LOW (ref 3.5–5.1)
SODIUM: 144 mmol/L (ref 136–145)
SODIUM: 142 mmol/L (ref 136–145)

## 2018-07-10 LAB — CBC
HCT: 24.2 % — ABNORMAL LOW (ref 34.8–46.0)
HGB: 7.8 g/dL — ABNORMAL LOW (ref 11.5–16.0)
MCH: 29.4 pg (ref 26.0–32.0)
MCHC: 32.2 g/dL (ref 31.0–35.5)
MCV: 91.3 fL (ref 78.0–100.0)
MPV: 11.7 fL (ref 8.7–12.5)
PLATELETS: 127 10*3/uL — ABNORMAL LOW (ref 150–400)
RBC: 2.65 x10ˆ6/uL — ABNORMAL LOW (ref 3.85–5.22)
RDW-CV: 15.4 % (ref 11.5–15.5)
WBC: 10 x10ˆ3/uL (ref 3.7–11.0)

## 2018-07-10 LAB — PHOSPHORUS
PHOSPHORUS: 3.3 mg/dL (ref 2.4–4.7)
PHOSPHORUS: 2.6 mg/dL (ref 2.4–4.7)

## 2018-07-10 MED ORDER — POTASSIUM BICARBONATE-CITRIC ACID 20 MEQ EFFERVESCENT TABLET
40.0000 meq | EFFERVESCENT_TABLET | Freq: Every morning | ORAL | Status: DC
Start: 2018-07-10 — End: 2018-07-12
  Administered 2018-07-10 – 2018-07-11 (×2): 40 meq via GASTROSTOMY
  Filled 2018-07-10 (×2): qty 2

## 2018-07-10 MED ORDER — POTASSIUM BICARBONATE-CITRIC ACID 20 MEQ EFFERVESCENT TABLET
60.0000 meq | EFFERVESCENT_TABLET | ORAL | Status: AC
Start: 2018-07-10 — End: 2018-07-10
  Administered 2018-07-10: 60 meq via GASTROSTOMY
  Filled 2018-07-10: qty 3

## 2018-07-10 MED ORDER — SODIUM CHLORIDE 0.9 % IV BOLUS
500.0000 mL | INJECTION | Freq: Once | Status: DC
Start: 2018-07-10 — End: 2018-07-10

## 2018-07-10 MED ORDER — POTASSIUM CHLORIDE 10 MEQ/100ML IN STERILE WATER INTRAVENOUS PIGGYBACK
10.0000 meq | INJECTION | INTRAVENOUS | Status: AC
Start: 2018-07-10 — End: 2018-07-10
  Administered 2018-07-10: 0 meq via INTRAVENOUS
  Administered 2018-07-10: 10 meq via INTRAVENOUS
  Administered 2018-07-10: 0 meq via INTRAVENOUS
  Administered 2018-07-10: 10 meq via INTRAVENOUS
  Filled 2018-07-10 (×2): qty 100

## 2018-07-10 MED ORDER — SODIUM CHLORIDE 0.9 % IV BOLUS
500.0000 mL | INJECTION | Status: AC
Start: 2018-07-10 — End: 2018-07-10
  Administered 2018-07-10: 0 mL via INTRAVENOUS
  Administered 2018-07-10: 500 mL via INTRAVENOUS

## 2018-07-10 MED ADMIN — lactated Ringers intravenous solution: @ 13:00:00 | NDC 00338011704

## 2018-07-10 MED ADMIN — morphine 2 mg/mL intravenous syringe: INTRAVENOUS | @ 13:00:00

## 2018-07-10 MED ADMIN — labetaloL 20 mg/4 mL (5 mg/mL) intravenous syringe: INTRAVENOUS | @ 09:00:00

## 2018-07-10 MED ADMIN — sodium chloride 0.9 % intravenous solution: INTRAVENOUS | @ 18:00:00

## 2018-07-10 MED ADMIN — senna leaf extract 176 mg/5 mL oral syrup: GASTROSTOMY | @ 09:00:00

## 2018-07-10 MED ADMIN — bacitracin 500 unit/gram topical ointment: TOPICAL | @ 08:00:00

## 2018-07-10 MED ADMIN — famotidine 20 mg tablet: GASTROSTOMY | @ 08:00:00

## 2018-07-10 MED ADMIN — lactated Ringers intravenous solution: GASTROSTOMY | @ 08:00:00

## 2018-07-10 NOTE — Nurses Notes (Signed)
Notified NCCU of pt's potassium being 3.1. K runs and potassium bicarb ordered. Will administer and continue to monitor pt.

## 2018-07-10 NOTE — Progress Notes (Signed)
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   GCS: 1/5/1T ; patient localized in the LUE, withdraws in the rest   Overnight no acute events reported. No change in exam.   Patient had CT brain done yesterday concerning for worsening mental status; however, did not show anything new.       Vital Signs:  Temp (24hrs) Max:38.6 C (376.2 F)      Systolic (83TDV), VOH:607 , Min:103 , PXT:062     Diastolic (69SWN), IOE:70, Min:59, Max:89    Temp  Avg: 38.2 C (100.7 F)  Min: 37.2 C (99 F)  Max: 38.6 C (101.5 F)  MAP (Non-Invasive)  Avg: 85.1 mmHG  Min: 73 mmHG  Max: 104 mmHG  Pulse  Avg: 97.8  Min: 83  Max: 124  Resp  Avg: 19  Min: 0  Max: 28  SpO2  Avg: 97.9 %  Min: 93 %  Max: 100 %  Pain Score (Numeric, Faces): Other(CPOT)    Current Medications:  acetaminophen (TYLENOL) 160 mg per 5 mL liquid - grape flavored, 650 mg, Gastric (NG, OG, PEG, GT), Q4H PRN  ascorbic acid (VITAMIN C) tablet, 500 mg, Gastric (NG, OG, PEG, GT), 2x/day  bacitracin 500 units/gram topical ointment tube, , Topical, 2x/day  cefepime (MAXIPIME) 2 g in NS 100 mL IVPB, 2 g, Intravenous, Q8H  chlorhexidine gluconate (PERIDEX) 0.12% mouthwash, 15 mL, Swish & Spit, 2x/day  enoxaparin PF (LOVENOX) 40 mg/0.4 mL SubQ injection, 40 mg, Subcutaneous, Q24H  famotidine (PEPCID) tablet, 20 mg, Gastric (NG, OG, PEG, GT), 2x/day  fentaNYL (SUBLIMAZE) 50 mcg/mL injection, 25 mcg, Intravenous, Q1H PRN  hydrALAZINE (APRESOLINE) injection 10 mg, 10 mg, Intravenous, Q4H PRN  HYDROcodone-acetaminophen (NORCO) 5-325 mg per tablet, 1 Tab, Gastric (NG, OG, PEG, GT), Q4H PRN    Or  HYDROcodone-acetaminophen (NORCO)  5-325 mg per tablet, 2 Tab, Gastric (NG, OG, PEG, GT), Q4H PRN  labetalol (TRANDATE) 5 mg/mL injection, 10 mg, Intravenous, Q1H PRN  lanolin-oxyquin-pet, hydrophil (BAG BALM) topical ointment, , Apply Topically, 2x/day PRN  levETIRAcetam (KEPPRA) tablet, 500 mg, Gastric (NG, OG, PEG, GT), 2x/day  lisinopril (PRINIVIL) tablet, 40 mg, Gastric (NG, OG, PEG, GT), Daily  metoprolol tartrate (LOPRESSOR) tablet, 25 mg, Gastric (NG, OG, PEG, GT), 2x/day  morphine 2 mg/mL injection, 2 mg, Intravenous, Q4H PRN  nicotine (NICODERM CQ) transdermal patch (mg/24 hr), 14 mg, Transdermal, Daily  NS flush syringe, 2 mL, Intracatheter, Q8HRS    And  NS flush syringe, 2-6 mL, Intracatheter, Q1 MIN PRN  nutrition protein supplement 15 g per 30 mL packet, 1 Packet, Gastric (NG, OG, PEG, GT), 2x/day  nystatin (NYSTOP) 100,000 units/g topical powder, , Apply Topically, 2x/day  ondansetron (ZOFRAN) 2 mg/mL injection, 4 mg, Intravenous, Q6H PRN  polyethylene glycol (MIRALAX) oral packet, 17 g, Gastric (NG, OG, PEG, GT), Daily  potassium bicarbonate-citric acid (EFFER-K) effervescent tablet, 40 mEq, Gastric (NG, OG, PEG, GT), Daily with Breakfast  senna concentrate (SENNA) 537m per 119moral liquid, 10 mL, Gastric (NG, OG, PEG, GT), 2x/day  SSIP insulin lispro (HUMALOG) 100 units/mL injection, 0-12 Units, Subcutaneous, Q4H PRN  Today's Physical Exam:   General: Appears stated age, up in chair with helmet  Neurologic:   GCS E1 V1T M5= 7T, intubated, PERRL, gaze midline, no facial weakness;localized b/l UE , withdraws  BLE .   Cardiovascular:    Heart regular rate and rhythm  Lungs: diminished bilaterally  Abdomen: soft, non-tender and bowel sounds normal  Extremities: no cyanosis or edema  Skin: Skin warm and dry    Labs:  I have reviewed all lab results.    I/O:  I/O last 24 hours:      Intake/Output Summary (Last 24 hours) at 07/10/2018 0855  Last data filed at 07/10/2018 0400  Gross per 24 hour   Intake 1217 ml   Output 675 ml   Net  542 ml     I/O current shift:  No intake/output data recorded.    Drips:   Current Facility-Administered Medications   Medication Dose Frequency Last Rate         Radiology Results:  MRI brain w/wo contrast 07/06/18: Pending final read    Other Testing:   See below    Microbiology:   See below       Impression and Plan:    Active Hospital Problems    Diagnosis   . 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     Neurologic:  L Tentorial meningioma s/p craniotomy for tumor resection, s/p evacuation of post-op hemorrhage; POD#2/POD#1  -- [facility] neuro checks as patient is at risk for acute neurological decline  -- MRI brain w/wo contrast 07/06/18: Post-op changes following resection, concern for infarct left midbrain, will f/u final read  - JP drain removed on 07/07/18  - Lumbar drain removed on 07/06/18  -- CT brain 07/08/18 : mild increase in cerebral edema   -- Seizure prophylaxis with Keppra 500 mg BID x 7 days, end 07/12/18  -- STAT CT brain without contrast for any acute change in neurological status     Pain/sedation  -- Tylenol PRN      Cardiovascular:    HTN, chronic  -BP 127/47, HR 79  -- SBP goal < 140  -- Hydralazine/labetalol (x1 overnight) PRN  -- Current antihypertensives:  -- Metoprolol 25 mg BID    Respiratory:    Acute respiratory failure requiring mechanical ventilation  Ventilator Settings:  Set PEEP: 5 cmH2O  Pressure Support: 8 cmH2O  FiO2: 30 %  -- CXR on 07/06/18: No acute process  -- Weaning parameters ordered for 12/16 AM    Renal: electrolyte imbalance   -Potasium 3.1, Na/Mag/phos WNL  -bun/cr: 18/0.64  -replete electrolytes accordingly   -- 1.8L free water deficit, FWF 200 cc Q4H    hypophosphatemia  -- Repleted    Infectious Disease:   Leukocytosis-improved  -- Tmax 38.6  -- Cultures:    -- [facility] 07/07/18:   -ve  at day 2   -- Urine culture 07/07/18:No UTI    -- MRSA screen 07/07/18: Negative   -- BAL 07/07/18: -- Current meds: cefepime 2 g q8 hours day 3      Hematology:Normocytic anemia   -h/h today 7.8/24.2  -continue to monitor and transfuse if hb<7    Thrombocytopenia, acute could be dilutional  Improving   -Platelete count tody 127   -continue to monitor daily     DVT prophylaxis  lovenox       Gastrointestinal:    At risk for constipation  -continue Osmolite 1.5 Q4   -patient has Flexi-seal tube in   -- Miralax/senna scheduled  -- LBM PTA      Endocrine:  No acute issues  -- SSIP conservative      Psychiatric:  No acute issues  -- Monitor for ICU delirium       Prophylaxis:    DVT/PE Prophylaxis: SCDs/ Venodynes/Impulse boots  GI: Famotidine  Seizure: Keppra     Family:  None present at bedside  Disposition/ Baseline:  ICU  Advance Directives:  None-Discussed  Hospice involvement prior to admission?  no    Greggory Brandy, MD   [facility] neurology PGY-2   Pager [phone]      I saw and examined the patient on 07/10/18.  I reviewed the resident's note.  I agree with the findings including assessment and plan as documented in the resident's note.  Any exceptions/additions are noted .    The following organ systems are impaired causing an immanent threat to his life or potentially causing his condition to deteriorate.  The treatment of these conditions required highly complex decision making to manipulate and support his organ damage.     Neurological- intermittently follows commands, but reliably localizes.  Repeat CT was stable.  S/p tentorial meningioma resection with post op hemorrhage and craniectomy.    Cardiovascular-goal SBP < 140.  1 PRN o/n.   Pulmonological-acute respiratory failure requiring mechanical ventilation.  Good weaning parameters, but elevated secretions.  May be able to extubate if more awake and secretions clear.   Gastro-intestinal-dysphagia, Nutrition supported while patient with dysphagia and remains  intubated/unable to swallow to prevent malnutrition.    Genitourinary-hypokalemia that is persistent.  Will start daily repletion.      Infectious disease- fevers, presumed PNA.  Cefepime day 3/7.  Likely sinusitis as well.     The patients disposition is ICu     Critical Care Time: 30  minutes    Merilyn Baba, MD

## 2018-07-10 NOTE — Respiratory Therapy (Signed)
Pt currently on PSV PS 8 Peep 5 Fio2 30%. Pt requires frequent suctioning, sx for clear thick oral sections and clear/white thick secretions in ETT. Will continue to monitor.

## 2018-07-10 NOTE — Nurses Notes (Signed)
Telemetry called and stated that patient had short burst of SVT lasting 1.9 seconds. NCCU service in patients room and notified.

## 2018-07-10 NOTE — Care Plan (Signed)
Problem: Adult Inpatient Plan of Care  Goal: Plan of Care Review  Outcome: Ongoing (see interventions/notes)  Goal: Patient-Specific Goal (Individualization)  Outcome: Ongoing (see interventions/notes)  Flowsheets (Taken 07/10/2018 1330)  Individualized Care Needs: Monitor patient for signs and symptoms of discomfort.  Anxieties, Fears or Concerns: UTA/ETT  Patient-Specific Goals (Include Timeframe): Patient will follow simple commands by end of day.  Goal: Absence of Hospital-Acquired Illness or Injury  Outcome: Ongoing (see interventions/notes)  Goal: Optimal Comfort and Wellbeing  Outcome: Ongoing (see interventions/notes)  Goal: Rounds/Family Conference  Outcome: Ongoing (see interventions/notes)     Problem: Communication Impairment (Mechanical Ventilation, Invasive)  Goal: Effective Communication  Outcome: Ongoing (see interventions/notes)     Problem: Device-Related Complication Risk (Mechanical Ventilation, Invasive)  Goal: Optimal Device Function  Outcome: Ongoing (see interventions/notes)     Problem: Inability to Wean (Mechanical Ventilation, Invasive)  Goal: Mechanical Ventilation Liberation  Outcome: Ongoing (see interventions/notes)     Problem: Nutrition Impairment (Mechanical Ventilation, Invasive)  Goal: Optimal Nutrition Delivery  Outcome: Ongoing (see interventions/notes)     Problem: Skin and Tissue Injury (Mechanical Ventilation, Invasive)  Goal: Absence of Device-Related Skin and Tissue Injury  Outcome: Ongoing (see interventions/notes)     Problem: Ventilator-Induced Lung Injury (Mechanical Ventilation, Invasive)  Goal: Absence of Ventilator-Induced Lung Injury  Outcome: Ongoing (see interventions/notes)     Problem: Fall Injury Risk  Goal: Absence of Fall and Fall-Related Injury  Outcome: Ongoing (see interventions/notes)     Problem: Skin Injury Risk Increased  Goal: Skin Health and Integrity  Outcome: Ongoing (see interventions/notes)     Problem: Non-violent/Non-Self Destructive  Restraints  Goal: Alternative methods tried prior to restraints  Outcome: Ongoing (see interventions/notes)  Goal: Patient free from injury and discomfort  Outcome: Ongoing (see interventions/notes)  Goal: Autonomy maintained at the highest possible level  Outcome: Ongoing (see interventions/notes)  Goal: Need for restraints reassessed per policy  Outcome: Ongoing (see interventions/notes)  Goal: Patient education provided  Outcome: Ongoing (see interventions/notes)  Goal: Problem Interventions  Outcome: Ongoing (see interventions/notes)     Problem: Hypertension Comorbidity  Goal: Blood Pressure in Desired Range  Outcome: Ongoing (see interventions/notes)     Problem: Depression  Goal: Improved Mood  Outcome: Ongoing (see interventions/notes)     Problem: Feeding Intolerance (Enteral Nutrition)  Goal: Feeding Tolerance  Outcome: Ongoing (see interventions/notes)   Patient intubated and unable to verbalize at this time. Patient has been showing signs and symptoms of discomfort and pain medications have been administered PRN as ordered. SBP above parameters this morning and received one dose of Labetalol and SBP continues to be monitored. Patient turned and repositioned every two hours and as necessary and incontinence care is provided. Flexi seal removed this morning and patient tolerated procedure well. Currently resting quietly in bed and showing no signs or symptoms of distress at this time.

## 2018-07-10 NOTE — Nurses Notes (Signed)
Restraint Continuation      Patient continues to have the following condition: withdrawn, unable to follow instructions, reaching for ett and other lines/tubes.       Least restrictive alternatives attempted : Increase patient observation, bed-exit alarm/motion detector, call light accessible, concealed tubes/lines, decreased/removed stimulus, toileting, frequent orientation, involved patient in conversation, provided familiar/personal items, reoriented to person, place, time, and treatment, encouraged relaxation techniques, redirected behavior and assessed meds/medical problems    Patient continues to exhibit the following behaviors: uncooperative and withdrawn    The restraint continued to facilitate medical/surgical treatment to ensure safety.    The patient will continue to be evaluated and assessments documented on the flowsheet to ensure that the patient is released from the restraint at the earliest possible time

## 2018-07-10 NOTE — Respiratory Therapy (Signed)
07/10/18 0450   Weaning Parameters   $ Weaning Parameters I   Min. Volume 12.8 Liters   Resp Rate 25 Breath Per Minute   Avg. VT 512 mL   FVC   (pt unable to perform.)   NIF -40 cmH2O  (starving)   RSBI 49   Cuff Leaked Assessment Leak Present     Patient remains on CPAP/PS 8/+5/30%.  No issues throughout the night.  Suctioning frequently for clear secretions.  SBT completed along with parameters, results above.  FVC not completed due to patient being unable to follow commands at this time.  RT will continue to monitor.

## 2018-07-10 NOTE — Care Plan (Signed)
GCS 1, 5, 1. Labetalol given for elevated BPs > 140. tylenol given twice for temps 38.5-38.6. Potassium 3.1, K runs ordered. tube feeds continued. turned q2. oral care q4. No other acute events. assessments and vitals per doc flow sheets.       Problem: Adult Inpatient Plan of Care  Goal: Plan of Care Review  Outcome: Ongoing (see interventions/notes)  Flowsheets  Taken 07/10/2018 0400  Plan of Care Reviewed With: patient  Taken 07/10/2018 2395  Progress: no change  Goal: Patient-Specific Goal (Individualization)  Outcome: Ongoing (see interventions/notes)  Flowsheets  Taken 07/09/2018 1522 by Valda Lamb, RN  Individualized Care Needs: Encourage patient to open eyes.  Taken 07/10/2018 3202 by Lawernce Keas, RN  Anxieties, Fears or Concerns: uta; ett  Patient-Specific Goals (Include Timeframe): temps < 101.5  Goal: Absence of Hospital-Acquired Illness or Injury  Outcome: Ongoing (see interventions/notes)  Goal: Optimal Comfort and Wellbeing  Outcome: Ongoing (see interventions/notes)  Goal: Rounds/Family Conference  Outcome: Ongoing (see interventions/notes)     Problem: Communication Impairment (Mechanical Ventilation, Invasive)  Goal: Effective Communication  Outcome: Ongoing (see interventions/notes)     Problem: Device-Related Complication Risk (Mechanical Ventilation, Invasive)  Goal: Optimal Device Function  Outcome: Ongoing (see interventions/notes)     Problem: Inability to Wean (Mechanical Ventilation, Invasive)  Goal: Mechanical Ventilation Liberation  Outcome: Ongoing (see interventions/notes)     Problem: Nutrition Impairment (Mechanical Ventilation, Invasive)  Goal: Optimal Nutrition Delivery  Outcome: Ongoing (see interventions/notes)     Problem: Skin and Tissue Injury (Mechanical Ventilation, Invasive)  Goal: Absence of Device-Related Skin and Tissue Injury  Outcome: Ongoing (see interventions/notes)     Problem: Ventilator-Induced Lung Injury (Mechanical Ventilation, Invasive)  Goal: Absence of  Ventilator-Induced Lung Injury  Outcome: Ongoing (see interventions/notes)     Problem: Fall Injury Risk  Goal: Absence of Fall and Fall-Related Injury  Outcome: Ongoing (see interventions/notes)     Problem: Skin Injury Risk Increased  Goal: Skin Health and Integrity  Outcome: Ongoing (see interventions/notes)     Problem: Non-violent/Non-Self Destructive Restraints  Goal: Alternative methods tried prior to restraints  Outcome: Ongoing (see interventions/notes)  Goal: Patient free from injury and discomfort  Outcome: Ongoing (see interventions/notes)  Goal: Autonomy maintained at the highest possible level  Outcome: Ongoing (see interventions/notes)  Goal: Need for restraints reassessed per policy  Outcome: Ongoing (see interventions/notes)  Goal: Patient education provided  Outcome: Ongoing (see interventions/notes)  Goal: Problem Interventions  Outcome: Ongoing (see interventions/notes)     Problem: Hypertension Comorbidity  Goal: Blood Pressure in Desired Range  Outcome: Ongoing (see interventions/notes)     Problem: Depression  Goal: Improved Mood  Outcome: Ongoing (see interventions/notes)     Problem: Feeding Intolerance (Enteral Nutrition)  Goal: Feeding Tolerance  Outcome: Ongoing (see interventions/notes)

## 2018-07-10 NOTE — Care Management Notes (Signed)
[facility]  Care Management Note    Patient Name: [name]  Date of Birth: 1960-12-05  Sex: female  Date/Time of Admission: 07/05/2018  7:26 AM  Room/Bed: 11/A  Payor: HEALTH PLAN / Plan: HEALTH PLAN (PPO-NON ST EMP) / Product Type: PPO /    LOS: 5 days   Primary Care Providers:  Isac Caddy, MD, MD (General)    Admitting Diagnosis:  Meningioma (CMS [facility]) [D32.9]    Assessment:      07/10/18 1543   Assessment Details   Assessment Type Continued Assessment   Date of Care Management Update 07/10/18   Date of Next DCP Update 07/12/18   Care Management Plan   Discharge Planning Status plan in progress   Projected Discharge Date 07/15/18   Discharge Needs Assessment   Discharge Facility/Level of Care Needs Undetermined at this time   Patient continues NCCU appropriate on ventilator.  Plan is to continue to monitor patient, vent support and wean as tolerated, seizure ppx, labs, medication management, PT/OT.    Discharge Plan:  Undetermined at this time  Discharge plans undetermined pending patient progress and PT/OT recommendations.    The patient will continue to be evaluated for developing discharge needs.     Case Manager: Claudius Sis, [place] COORDINATOR  Phone: [phone]

## 2018-07-10 NOTE — Nurses Notes (Signed)
Restraint Continuation      Patient continues to have the following condition AMS.       Least restrictive alternatives attempted : Considered 1:1 sit, Provided 1:1 observation, moved closer to the nurses station, bed-exit alarm/motion detector, call light accessible, concealed tubes/lines, decreased/removed stimulus, frequent orientation, reoriented to person, place, time, and treatment and used TV/music    Patient continues to exhibit the following behaviors: uncooperative and agitated    The restraint continued to facilitate medical/surgical treatment to ensure safety.    The patient will continue to be evaluated and assessments documented on the flowsheet to ensure that the patient is released from the restraint at the earliest possible time

## 2018-07-10 NOTE — Care Plan (Signed)
VENTILATOR - CPAP(PS) / SPONTANEOUS CONTINUOUS    Discontinue   Duration: Until Specified    Priority: Routine       Question Answer Comment   FIO2 (%) 30    Peep(cm/H2O) 5    Pressure Support(cm/H2O) 8    Indications IMPROVE DISTRIBUTION OF VENTILATION    Invasive/Non-Invasive INVASIVE         Patient currently being mechanically ventilated on above settings.  Waiting for patients mental status to improve for further extubation evaluation.  RT will continue to monitor patient.

## 2018-07-11 ENCOUNTER — Inpatient Hospital Stay (HOSPITAL_COMMUNITY)

## 2018-07-11 DIAGNOSIS — G936 Cerebral edema: Secondary | ICD-10-CM

## 2018-07-11 DIAGNOSIS — G9340 Encephalopathy, unspecified: Secondary | ICD-10-CM

## 2018-07-11 DIAGNOSIS — R9431 Abnormal electrocardiogram [ECG] [EKG]: Secondary | ICD-10-CM

## 2018-07-11 DIAGNOSIS — F17200 Nicotine dependence, unspecified, uncomplicated: Secondary | ICD-10-CM

## 2018-07-11 DIAGNOSIS — D329 Benign neoplasm of meninges, unspecified: Secondary | ICD-10-CM

## 2018-07-11 LAB — URINALYSIS, MACROSCOPIC
BILIRUBIN: NEGATIVE mg/dL
BLOOD: NEGATIVE mg/dL
COLOR: NORMAL
GLUCOSE: NEGATIVE mg/dL
KETONES: NEGATIVE mg/dL
LEUKOCYTES: NEGATIVE WBCs/uL
NITRITE: NEGATIVE
PH: 8 (ref 5.0–8.0)
PROTEIN: NEGATIVE mg/dL
SPECIFIC GRAVITY: 1.014 (ref 1.005–1.030)
UROBILINOGEN: NEGATIVE mg/dL

## 2018-07-11 LAB — ECG 12-LEAD
Atrial Rate: 111 {beats}/min
Calculated P Axis: -24 degrees
Calculated R Axis: -30 degrees
Calculated T Axis: -29 degrees
PR Interval: 120 ms
QRS Duration: 90 ms
QT Interval: 340 ms
QTC Calculation: 462 ms
Ventricular rate: 111 {beats}/min

## 2018-07-11 LAB — POC BLOOD GLUCOSE (RESULTS)
GLUCOSE, POC: 153 mg/dL — ABNORMAL HIGH (ref 70–105)
GLUCOSE, POC: 148 mg/dL — ABNORMAL HIGH (ref 70–105)
GLUCOSE, POC: 110 mg/dL — ABNORMAL HIGH (ref 70–105)
GLUCOSE, POC: 168 mg/dL — ABNORMAL HIGH (ref 70–105)

## 2018-07-11 LAB — CBC
HCT: 24.7 % — ABNORMAL LOW (ref 34.8–46.0)
HGB: 8 g/dL — ABNORMAL LOW (ref 11.5–16.0)
MCH: 30.1 pg (ref 26.0–32.0)
MCHC: 32.4 g/dL (ref 31.0–35.5)
MCV: 92.9 fL (ref 78.0–100.0)
MPV: 11.1 fL (ref 8.7–12.5)
PLATELETS: 151 x10ˆ3/uL (ref 150–400)
RBC: 2.66 x10ˆ6/uL — ABNORMAL LOW (ref 3.85–5.22)
RDW-CV: 15.5 % (ref 11.5–15.5)
WBC: 10.9 x10ˆ3/uL (ref 3.7–11.0)

## 2018-07-11 LAB — ARTERIAL BLOOD GAS - MLS OP ONLY
BASE EXCESS (ARTERIAL): 3.4 mmol/L — ABNORMAL HIGH (ref 0.0–1.0)
BICARBONATE (ARTERIAL): 27.6 mmol/L — ABNORMAL HIGH (ref 18.0–26.0)
PCO2 (ARTERIAL): 36 mmHg (ref 35.0–45.0)
PH (ARTERIAL): 7.48 — ABNORMAL HIGH (ref 7.35–7.45)
PO2 (ARTERIAL): 88 mmHg (ref 72.0–100.0)

## 2018-07-11 LAB — BASIC METABOLIC PANEL
ANION GAP: 9 mmol/L (ref 4–13)
BUN/CREA RATIO: 29 — ABNORMAL HIGH (ref 6–22)
BUN: 18 mg/dL (ref 8–25)
CALCIUM: 8.7 mg/dL (ref 8.5–10.2)
CHLORIDE: 106 mmol/L (ref 96–111)
CO2 TOTAL: 25 mmol/L (ref 22–32)
CREATININE: 0.63 mg/dL (ref 0.49–1.10)
ESTIMATED GFR: >60 mL/min/1.73mˆ2 (ref >60)
GLUCOSE: 111 mg/dL (ref 65–139)
POTASSIUM: 3.5 mmol/L (ref 3.5–5.1)
SODIUM: 140 mmol/L (ref 136–145)

## 2018-07-11 LAB — ARTERIAL BLOOD GAS
%FIO2 (ARTERIAL): 30 %
PAO2/FIO2 RATIO: 293 (ref <=200)
PAO2/FIO2 RATIO: 293 (ref <=200)

## 2018-07-11 LAB — URINALYSIS, MICROSCOPIC
HYALINE CASTS: 2 /LPF (ref <4.0)
RBCS: 0 /HPF (ref <6.0)
WBCS: 2 /HPF (ref <11.0)

## 2018-07-11 LAB — MAGNESIUM: MAGNESIUM: 2.1 mg/dL (ref 1.6–2.6)

## 2018-07-11 LAB — PHOSPHORUS: PHOSPHORUS: 3.1 mg/dL (ref 2.4–4.7)

## 2018-07-11 MED ORDER — LORAZEPAM 2 MG/ML INJECTION SOLUTION
2.0000 mg | INTRAMUSCULAR | Status: AC
Start: 2018-07-11 — End: 2018-07-11
  Administered 2018-07-11: 2 mg via INTRAVENOUS
  Filled 2018-07-11: qty 1

## 2018-07-11 MED ORDER — LORAZEPAM 2 MG/ML INJECTION SOLUTION
2.0000 mg | INTRAMUSCULAR | Status: AC
Start: 2018-07-11 — End: 2018-07-11
  Administered 2018-07-11: 09:00:00 2 mg via INTRAVENOUS
  Filled 2018-07-11: qty 1

## 2018-07-11 MED ORDER — LEVETIRACETAM 500 MG TABLET
750.0000 mg | ORAL_TABLET | Freq: Two times a day (BID) | ORAL | Status: DC
Start: 2018-07-11 — End: 2018-07-15
  Administered 2018-07-11 – 2018-07-15 (×8): 750 mg via GASTROSTOMY
  Filled 2018-07-11 (×8): qty 1

## 2018-07-11 MED ORDER — LABETALOL 20 MG/4 ML (5 MG/ML) INTRAVENOUS SYRINGE
10.00 mg | INJECTION | INTRAVENOUS | Status: DC | PRN
Start: 2018-07-11 — End: 2018-08-08
  Administered 2018-07-11 – 2018-07-22 (×13): 10 mg via INTRAVENOUS
  Filled 2018-07-11 (×12): qty 4

## 2018-07-11 MED ORDER — HYDRALAZINE 20 MG/ML INJECTION SOLUTION
10.00 mg | INTRAMUSCULAR | Status: DC | PRN
Start: 2018-07-11 — End: 2018-08-08
  Filled 2018-07-11: qty 1

## 2018-07-11 MED ADMIN — sodium chloride 0.9 % (flush) injection syringe: @ 04:00:00

## 2018-07-11 MED ADMIN — nystatin 100,000 unit/gram topical powder: TOPICAL | @ 08:00:00

## 2018-07-11 MED ADMIN — Medication: GASTROSTOMY | @ 16:00:00

## 2018-07-11 MED ADMIN — sodium chloride 0.9 % (flush) injection syringe: @ 20:00:00

## 2018-07-11 MED ADMIN — chlorhexidine gluconate 0.12 % mouthwash: ORAL | @ 20:00:00

## 2018-07-11 MED ADMIN — DEXTROSE 5% 1/2 NORMAL SALINE W/ ADDITIVES: GASTROSTOMY | @ 08:00:00 | NDC 00338008504

## 2018-07-11 MED ADMIN — lanolin-oxyquin-pet, hydrophil topical ointment: INTRAVENOUS | @ 20:00:00 | NDC 09999989257

## 2018-07-11 NOTE — Progress Notes (Signed)
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   GCS: 1/5/1T ; patient localized in the LUE, withdraws in the rest   Overnight no acute events reported. No change in exam.         Vital Signs:  Temp (24hrs) Max:38.5 C (449.6 F)      Systolic (75FFM), BWG:665 , Min:108 , LDJ:570     Diastolic (17BLT), JQZ:00, Min:57, Max:110    Temp  Avg: 37.9 C (100.3 F)  Min: 37.2 C (99 F)  Max: 38.5 C (101.3 F)  MAP (Non-Invasive)  Avg: 88.6 mmHG  Min: 72 mmHG  Max: 119 mmHG  Pulse  Avg: 95.1  Min: 78  Max: 115  Resp  Avg: 17.7  Min: 13  Max: 21  SpO2  Avg: 98 %  Min: 94 %  Max: 100 %  Pain Score (Numeric, Faces): 3    Current Medications:  acetaminophen (TYLENOL) 160 mg per 5 mL liquid - grape flavored, 650 mg, Gastric (NG, OG, PEG, GT), Q4H PRN  ascorbic acid (VITAMIN C) tablet, 500 mg, Gastric (NG, OG, PEG, GT), 2x/day  bacitracin 500 units/gram topical ointment tube, , Topical, 2x/day  cefepime (MAXIPIME) 2 g in NS 100 mL IVPB, 2 g, Intravenous, Q8H  chlorhexidine gluconate (PERIDEX) 0.12% mouthwash, 15 mL, Swish & Spit, 2x/day  enoxaparin PF (LOVENOX) 40 mg/0.4 mL SubQ injection, 40 mg, Subcutaneous, Q24H  famotidine (PEPCID) tablet, 20 mg, Gastric (NG, OG, PEG, GT), 2x/day  fentaNYL (SUBLIMAZE) 50 mcg/mL injection, 25 mcg, Intravenous, Q1H PRN  hydrALAZINE (APRESOLINE) injection 10 mg, 10 mg, Intravenous, Q4H PRN  HYDROcodone-acetaminophen (NORCO) 5-325 mg per tablet, 1 Tab, Gastric (NG, OG, PEG, GT), Q4H PRN    Or  HYDROcodone-acetaminophen (NORCO) 5-325 mg per tablet, 2 Tab, Gastric (NG, OG, PEG, GT), Q4H PRN  labetalol (TRANDATE) 5 mg/mL injection, 10 mg,  Intravenous, Q1H PRN  lanolin-oxyquin-pet, hydrophil (BAG BALM) topical ointment, , Apply Topically, 2x/day PRN  levETIRAcetam (KEPPRA) tablet, 500 mg, Gastric (NG, OG, PEG, GT), 2x/day  lisinopril (PRINIVIL) tablet, 40 mg, Gastric (NG, OG, PEG, GT), Daily  metoprolol tartrate (LOPRESSOR) tablet, 25 mg, Gastric (NG, OG, PEG, GT), 2x/day  morphine 2 mg/mL injection, 2 mg, Intravenous, Q4H PRN  nicotine (NICODERM CQ) transdermal patch (mg/24 hr), 14 mg, Transdermal, Daily  NS flush syringe, 2 mL, Intracatheter, Q8HRS    And  NS flush syringe, 2-6 mL, Intracatheter, Q1 MIN PRN  nutrition protein supplement 15 g per 30 mL packet, 1 Packet, Gastric (NG, OG, PEG, GT), 2x/day  nystatin (NYSTOP) 100,000 units/g topical powder, , Apply Topically, 2x/day  ondansetron (ZOFRAN) 2 mg/mL injection, 4 mg, Intravenous, Q6H PRN  polyethylene glycol (MIRALAX) oral packet, 17 g, Gastric (NG, OG, PEG, GT), Daily  potassium bicarbonate-citric acid (EFFER-K) effervescent tablet, 40 mEq, Gastric (NG, OG, PEG, GT), Daily with Breakfast  senna concentrate (SENNA) 574m per 156moral liquid, 10 mL, Gastric (NG, OG, PEG, GT), 2x/day  SSIP insulin lispro (HUMALOG) 100 units/mL injection, 0-12 Units, Subcutaneous, Q4H PRN      Today's Physical Exam:   General: Appears stated age, up in chair with helmet  Neurologic:   GCS E1 V1T M5= 7T, intubated, PERRL, gaze midline, no facial weakness;localized b/l UE , withdraws  BLE .   Cardiovascular:    Heart regular rate and rhythm  Lungs: diminished bilaterally  Abdomen: soft, non-tender and bowel sounds normal  Extremities: no cyanosis or edema  Skin: Skin warm and dry    Labs:  I have reviewed all lab results.    I/O:  I/O last 24 hours:      Intake/Output Summary (Last 24 hours) at 07/11/2018 0649  Last data filed at 07/11/2018 0400  Gross per 24 hour   Intake 2008 ml   Output 0 ml   Net 2008 ml     I/O current shift:  12/18 1900 - 12/19 0659  In: 1034 [OG:1034]  Out: -     Drips:   Current  Facility-Administered Medications   Medication Dose Frequency Last Rate         Radiology Results:  MRI brain w/wo contrast 07/06/18: Pending final read    Other Testing:   See below    Microbiology:   See below       Impression and Plan:    Active Hospital Problems    Diagnosis   . 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 Hypokalemia     57 years old female with progressive R sided weakness s/p left meningioma resection s/p Craniectomy for evacuation of tumor bed hematoma .     Neurologic:  L Tentorial meningioma s/p craniotomy for tumor resection, s/p evacuation of post-op hemorrhage; POD#2/POD#1  -- [facility] neuro checks as patient is at risk for acute neurological decline  -- MRI brain w/wo contrast 07/06/18: Post-op changes following resection, concern for infarct left midbrain, will f/u final read  - JP drain removed on 07/07/18  - Lumbar drain removed on 07/06/18  -- CT brain 07/08/18 : mild increase in cerebral edema   -- patient is on VEEG; had epileptogenic activity reversed with ativan; plan to increase Keppra to 750 mg BID and continue   -- STAT CT brain without contrast for any acute change in neurological status     Pain/sedation  -- Tylenol PRN      Cardiovascular:    HTN, chronic  -BP 132/69, HR 91  -- SBP goal < 140  -- Hydralazine/labetalol (x1 overnight) PRN  -- Current antihypertensives:  -- Metoprolol 25 mg BID    Respiratory:    Acute respiratory failure requiring mechanical ventilation    Ventilator Settings:  Cpap/pressure support   Set PEEP: 5 cmH2O  Pressure Support: 8 cmH2O  FiO2: 30 %   -- CXR on 07/06/18: No acute process  -- Weaning parameters ordered for 12/16 AM    Renal: electrolyte imbalance   -Potasium 3.1, Na/Mag/phos WNL  -bun/cr: 18/0.64  -replete electrolytes accordingly   -- 1.8L free water deficit, FWF 200 cc Q4H    hypophosphatemia  -- Repleted    Infectious Disease:   Leukocytosis-improved  -- Tmax 38.5  --  Cultures:    -- [facility] 07/07/18:   -ve    -- Urine culture 07/07/18:No UTI    -- MRSA screen 07/07/18: Negative   -- BAL 07/07/18: -- Current meds: cefepime 2 g q8 hours day 4 EOT 12/22      Hematology:Normocytic anemia   -h/h today 8.0/24.7  -continue to monitor and transfuse if hb<7    Thrombocytopenia, acute could be dilutional  Improved  -Platelete  count tody 151  -continue to monitor daily     DVT prophylaxis  lovenox       Gastrointestinal:    At risk for constipation  -continue Osmolite 1.5 Q4   -patient has Flexi-seal tube in   -- Miralax/senna scheduled        Endocrine:  No acute issues  -- SSIP conservative      Psychiatric:  No acute issues  -- Monitor for ICU delirium       Prophylaxis:    DVT/PE Prophylaxis: SCDs/ Venodynes/Impulse boots  GI: Famotidine  Seizure: Keppra     Family:  None present at bedside  Disposition/ Baseline:  ICU  Advance Directives:  None-Discussed  Hospice involvement prior to admission?  no    Greggory Brandy, MD   [facility] neurology PGY-2   Pager [phone]    I saw and examined the patient on 07/21/18.  I reviewed the resident's note.  I agree with the findings including assessment and plan as documented in the resident's note.  Any exceptions/additions are noted .    The following organ systems are impaired causing an immanent threat to his life or potentially causing his condition to deteriorate.  The treatment of these conditions required highly complex decision making to manipulate and support his organ damage.     Neurological- CT stable overnight.  Will follow up video EEG.  Neurologic exam remains very stable with patient briskly localizing a purposeful, but only intermittently following commands.  S/p left meningioma resection and return to OR for decompressive craniectomy.   Cardiovascular- goal SBP< 160   Pulmonological-acute respiratory failure requiring mechanical ventilation.  Remains on minimal vent settings.   Gastro-intestinal-dysphagia, Nutrition supported while patient  with dysphagia and remains intubated/unable to swallow to prevent malnutrition.  TF at goal.   Genitourinary-hypokalemia, continue repletion and increase daily supplements.     Infectious disease- cefepime ending 12/23 for presumed PNA     The patients disposition is ICU     Critical Care Time: 30 minutes    Merilyn Baba, MD

## 2018-07-11 NOTE — Care Plan (Signed)
CT completed. PRNs given for pain management. Tylenol and ice packs applied for esophageal temp 38.5. BUE restraints in place. tube feed continued. turned q2. assessments and vitals per doc flow sheets.       Problem: Adult Inpatient Plan of Care  Goal: Plan of Care Review  Outcome: Ongoing (see interventions/notes)  Goal: Patient-Specific Goal (Individualization)  Outcome: Ongoing (see interventions/notes)  Goal: Absence of Hospital-Acquired Illness or Injury  Outcome: Ongoing (see interventions/notes)  Goal: Optimal Comfort and Wellbeing  Outcome: Ongoing (see interventions/notes)  Goal: Rounds/Family Conference  Outcome: Ongoing (see interventions/notes)     Problem: Communication Impairment (Mechanical Ventilation, Invasive)  Goal: Effective Communication  Outcome: Ongoing (see interventions/notes)     Problem: Device-Related Complication Risk (Mechanical Ventilation, Invasive)  Goal: Optimal Device Function  Outcome: Ongoing (see interventions/notes)     Problem: Inability to Wean (Mechanical Ventilation, Invasive)  Goal: Mechanical Ventilation Liberation  Outcome: Ongoing (see interventions/notes)     Problem: Nutrition Impairment (Mechanical Ventilation, Invasive)  Goal: Optimal Nutrition Delivery  Outcome: Ongoing (see interventions/notes)     Problem: Skin and Tissue Injury (Mechanical Ventilation, Invasive)  Goal: Absence of Device-Related Skin and Tissue Injury  Outcome: Ongoing (see interventions/notes)     Problem: Ventilator-Induced Lung Injury (Mechanical Ventilation, Invasive)  Goal: Absence of Ventilator-Induced Lung Injury  Outcome: Ongoing (see interventions/notes)     Problem: Fall Injury Risk  Goal: Absence of Fall and Fall-Related Injury  Outcome: Ongoing (see interventions/notes)     Problem: Skin Injury Risk Increased  Goal: Skin Health and Integrity  Outcome: Ongoing (see interventions/notes)     Problem: Non-violent/Non-Self Destructive Restraints  Goal: Alternative methods tried prior to  restraints  Outcome: Ongoing (see interventions/notes)  Goal: Patient free from injury and discomfort  Outcome: Ongoing (see interventions/notes)  Goal: Autonomy maintained at the highest possible level  Outcome: Ongoing (see interventions/notes)  Goal: Need for restraints reassessed per policy  Outcome: Ongoing (see interventions/notes)  Goal: Patient education provided  Outcome: Ongoing (see interventions/notes)  Goal: Problem Interventions  Outcome: Ongoing (see interventions/notes)     Problem: Hypertension Comorbidity  Goal: Blood Pressure in Desired Range  Outcome: Ongoing (see interventions/notes)     Problem: Depression  Goal: Improved Mood  Outcome: Ongoing (see interventions/notes)     Problem: Feeding Intolerance (Enteral Nutrition)  Goal: Feeding Tolerance  Outcome: Ongoing (see interventions/notes)    Patient Goals Based on Modifiable Risk Factors    The following modifiable risk factors were discussed with patient:     Hypertension    Goal:   Maintain blood pressure less than 140/90 (or less than 130/90 for patients with diabetes or chronic kidney disease).    Patient's selected lifestyle modification(s):  Routinely take antihypertensive medications as prescribed    Patient/Family/Caregiver response to plan:    Patient intubated and unable to express understanding or not of education.

## 2018-07-11 NOTE — Nurses Notes (Signed)
Notified NCCU of pt having a 3 second run of SVT with HR reaching 190s per tele. No orders given. Will continue to monitor pt.

## 2018-07-11 NOTE — Progress Notes (Signed)
[facility]  NEUROSURGERY   PROGRESS NOTE      [name], [name], 57 y.o. female  Date of Admission:  07/05/2018  Date of Service: 07/11/2018  Date of Birth:  1961-01-25    Referring Physician:  No ref. provider found    Post Op Day: 6 Days Post-Op S/P Procedure(s) (LRB):  CRANIOTOMY (Left)    Chief Complaint: Left tentorial meningioma  Subjective: NAEO    Vital Signs:  Temp (24hrs) Max:38.5 C (630.1 F)      Systolic (60FUX), NAT:557 , Min:108 , DUK:025     Diastolic (42HCW), CBJ:62, Min:57, Max:89    Temp  Avg: 37.8 C (100 F)  Min: 37.2 C (99 F)  Max: 38.5 C (101.3 F)  MAP (Non-Invasive)  Avg: 86.7 mmHG  Min: 72 mmHG  Max: 113 mmHG  Pulse  Avg: 95.5  Min: 78  Max: 115  Resp  Avg: 18.1  Min: 13  Max: 21  SpO2  Avg: 97.9 %  Min: 94 %  Max: 100 %  Pain Score (Numeric, Faces): 3    Min/Max/Avg ICP/CPP last 24hrs:   No data recorded    Today's Physical Exam:  GCS 1/5/1T  Eyes closed  LOC LUE, WD RUE and BLE  PERRL  Face symmetric  +cough  Hearing grossly intact  CN 3 4 6  EOMI Unable to assess  CN 11 shrug symmetric Unable to assess  Muscle Strength Unable to assess  Unable to assess drift   -- Lumbar drain stitch in place (rapide)  -- JP drain stitch in place (rapide)  -- Incision C/D/I w/ staples    Current Medications:  acetaminophen (TYLENOL) 160 mg per 5 mL liquid - grape flavored, 650 mg, Gastric (NG, OG, PEG, GT), Q4H PRN  ascorbic acid (VITAMIN C) tablet, 500 mg, Gastric (NG, OG, PEG, GT), 2x/day  bacitracin 500 units/gram topical ointment tube, , Topical, 2x/day  cefepime (MAXIPIME) 2 g in NS 100 mL IVPB, 2 g, Intravenous, Q8H  chlorhexidine gluconate (PERIDEX) 0.12% mouthwash, 15 mL, Swish & Spit, 2x/day  enoxaparin PF (LOVENOX) 40 mg/0.4 mL SubQ injection, 40 mg, Subcutaneous, Q24H  famotidine (PEPCID) tablet, 20 mg, Gastric (NG, OG, PEG, GT), 2x/day  fentaNYL (SUBLIMAZE) 50 mcg/mL injection, 25 mcg, Intravenous, Q1H PRN  hydrALAZINE (APRESOLINE) injection 10 mg, 10 mg, Intravenous, Q4H  PRN  HYDROcodone-acetaminophen (NORCO) 5-325 mg per tablet, 1 Tab, Gastric (NG, OG, PEG, GT), Q4H PRN    Or  HYDROcodone-acetaminophen (NORCO) 5-325 mg per tablet, 2 Tab, Gastric (NG, OG, PEG, GT), Q4H PRN  labetalol (TRANDATE) 5 mg/mL injection, 10 mg, Intravenous, Q1H PRN  lanolin-oxyquin-pet, hydrophil (BAG BALM) topical ointment, , Apply Topically, 2x/day PRN  levETIRAcetam (KEPPRA) tablet, 500 mg, Gastric (NG, OG, PEG, GT), 2x/day  lisinopril (PRINIVIL) tablet, 40 mg, Gastric (NG, OG, PEG, GT), Daily  metoprolol tartrate (LOPRESSOR) tablet, 25 mg, Gastric (NG, OG, PEG, GT), 2x/day  morphine 2 mg/mL injection, 2 mg, Intravenous, Q4H PRN  nicotine (NICODERM CQ) transdermal patch (mg/24 hr), 14 mg, Transdermal, Daily  NS flush syringe, 2 mL, Intracatheter, Q8HRS    And  NS flush syringe, 2-6 mL, Intracatheter, Q1 MIN PRN  nutrition protein supplement 15 g per 30 mL packet, 1 Packet, Gastric (NG, OG, PEG, GT), 2x/day  nystatin (NYSTOP) 100,000 units/g topical powder, , Apply Topically, 2x/day  ondansetron (ZOFRAN) 2 mg/mL injection, 4 mg, Intravenous, Q6H PRN  polyethylene glycol (MIRALAX) oral packet, 17 g, Gastric (NG, OG, PEG, GT), Daily  potassium bicarbonate-citric acid (EFFER-K)  effervescent tablet, 40 mEq, Gastric (NG, OG, PEG, GT), Daily with Breakfast  senna concentrate (SENNA) 528mg  per 75mL oral liquid, 10 mL, Gastric (NG, OG, PEG, GT), 2x/day  SSIP insulin lispro (HUMALOG) 100 units/mL injection, 0-12 Units, Subcutaneous, Q4H PRN        I/O:  I/O last 24 hours:      Intake/Output Summary (Last 24 hours) at 07/11/2018 0333  Last data filed at 07/11/2018 0000  Gross per 24 hour   Intake 1748 ml   Output 0 ml   Net 1748 ml     I/O current shift:  12/18 1900 - 12/19 0659  In: 774 [OG:774]  Out: -     Antibiotics: Date Started Date Completed   1.     2.     3.     4.       Nutrition/Residuals:  MNT PROTOCOL FOR DIETITIAN  DIET NPO - NOW EXCEPT TUBE FEEDS  ADULT TUBE FEED - BOLUS Q4H WHILE AWAKE NO MEALS, TF  ONLY; OSMOLITE 1.5; NG; BOLUS; Bolus Amount: 1 can 5 times daily; Flush Tube with water (select amt): 200 ml; Flush Frequency: Q4H    Labs  Please indicate ordered or reviewed)  Reviewed:   Lab Results Today:    Results for orders placed or performed during the hospital encounter of 07/05/18 (from the past 24 hour(s))   POC BLOOD GLUCOSE (RESULTS)   Result Value Ref Range    GLUCOSE, POC 159 (H) 70 - 105 mg/dl   VENOUS BLOOD GAS   Result Value Ref Range    %FIO2 (VENOUS) 30.0 %    BICARBONATE (VENOUS) 28.1 (H) 22.0 - 26.0 mmol/L    PCO2 (VENOUS) 39.00 (L) 41.00 - 51.00 mm/Hg    PH (VENOUS) 7.47 (H) 7.31 - 7.41    PO2 (VENOUS) 47.0 35.0 - 50.0 mm/Hg    BASE EXCESS 4.5 (H) -3.0 - 3.0 mmol/L   POC BLOOD GLUCOSE (RESULTS)   Result Value Ref Range    GLUCOSE, POC 121 (H) 70 - 105 mg/dl   POC BLOOD GLUCOSE (RESULTS)   Result Value Ref Range    GLUCOSE, POC 159 (H) 70 - 105 mg/dl   POC BLOOD GLUCOSE (RESULTS)   Result Value Ref Range    GLUCOSE, POC 138 (H) 70 - 105 mg/dl   BASIC METABOLIC PANEL   Result Value Ref Range    SODIUM 142 136 - 145 mmol/L    POTASSIUM 3.4 (L) 3.5 - 5.1 mmol/L    CHLORIDE 106 96 - 111 mmol/L    CO2 TOTAL 28 22 - 32 mmol/L    ANION GAP 8 4 - 13 mmol/L    CALCIUM 8.6 8.5 - 10.2 mg/dL    GLUCOSE 131 65 - 139 mg/dL    BUN 16 8 - 25 mg/dL    CREATININE 0.65 0.49 - 1.10 mg/dL    BUN/CREA RATIO 25 (H) 6 - 22    ESTIMATED GFR >60 >60 mL/min/1.40m^2   PHOSPHORUS   Result Value Ref Range    PHOSPHORUS 2.6 2.4 - 4.7 mg/dL   MAGNESIUM   Result Value Ref Range    MAGNESIUM 1.9 1.6 - 2.6 mg/dL   VENOUS BLOOD GAS/LACTATE   Result Value Ref Range    %FIO2 (VENOUS) 30.0 %    PH (VENOUS) 7.51 (HH) 7.31 - 7.41    PCO2 (VENOUS) 36.00 (L) 41.00 - 51.00 mm/Hg    PO2 (VENOUS) 63.0 (H) 35.0 - 50.0 mm/Hg  BASE EXCESS 5.6 (H) -3.0 - 3.0 mmol/L    BICARBONATE (VENOUS) 29.2 (H) 22.0 - 26.0 mmol/L    LACTATE 1.9 (H) 0.0 - 1.3 mmol/L   VENOUS BLOOD GAS/LACTATE   Result Value Ref Range    %FIO2 (VENOUS) 30.0 %    PH  (VENOUS) 7.48 (H) 7.31 - 7.41    PCO2 (VENOUS) 38.00 (L) 41.00 - 51.00 mm/Hg    PO2 (VENOUS) 48.0 35.0 - 50.0 mm/Hg    BASE EXCESS 4.6 (H) -3.0 - 3.0 mmol/L    BICARBONATE (VENOUS) 28.2 (H) 22.0 - 26.0 mmol/L    LACTATE 1.1 0.0 - 1.3 mmol/L   POC BLOOD GLUCOSE (RESULTS)   Result Value Ref Range    GLUCOSE, POC 111 (H) 70 - 105 mg/dl   BASIC METABOLIC PANEL   Result Value Ref Range    SODIUM 140 136 - 145 mmol/L    POTASSIUM 3.5 3.5 - 5.1 mmol/L    CHLORIDE 106 96 - 111 mmol/L    CO2 TOTAL 25 22 - 32 mmol/L    ANION GAP 9 4 - 13 mmol/L    CALCIUM 8.7 8.5 - 10.2 mg/dL    GLUCOSE 111 65 - 139 mg/dL    BUN 18 8 - 25 mg/dL    CREATININE 0.63 0.49 - 1.10 mg/dL    BUN/CREA RATIO 29 (H) 6 - 22    ESTIMATED GFR >60 >60 mL/min/1.96m^2   CBC   Result Value Ref Range    WBC 10.9 3.7 - 11.0 x10^3/uL    RBC 2.66 (L) 3.85 - 5.22 x10^6/uL    HGB 8.0 (L) 11.5 - 16.0 g/dL    HCT 24.7 (L) 34.8 - 46.0 %    MCV 92.9 78.0 - 100.0 fL    MCH 30.1 26.0 - 32.0 pg    MCHC 32.4 31.0 - 35.5 g/dL    RDW-CV 15.5 11.5 - 15.5 %    PLATELETS 151 150 - 400 x10^3/uL    MPV 11.1 8.7 - 12.5 fL   MAGNESIUM   Result Value Ref Range    MAGNESIUM 2.1 1.6 - 2.6 mg/dL   PHOSPHORUS   Result Value Ref Range    PHOSPHORUS 3.1 2.4 - 4.7 mg/dL       Assessment/ Plan:  Active Hospital Problems    Diagnosis   . 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 Hypokalemia     [name] is a 57 y.o. female s/p left temporal craniotomy for left tentorial meningioma resection s/p left craniectomy for evacuation of tumor bed hematoma. POD6/5.  -- Keppra 500 mg BID x 7 days; end-date 07/12/18  -- Please bathe patient daily and clean wound daily                -- Okay for soap/shampoo                -- Apply bacitracin ointment BID, cleaning old ointment off before application of new ointment    -- Imaging:                 CT brain w/o (07/09/18 @ 1328): Stable postoperative changes from left  decompressive craniectomy with persistent mild extracalvarial brain herniation.  Persistent cerebral edema and hemorrhage at the tumor resection site.  No new area of hemorrhage.                CT brain w/o (07/09/18 @ 0430): Stable postoperative changes  from left decompressive craniectomy with persistent mild extracalvarial brain herniation through craniectomy site.  Unchanged cerebral edema and hemorrhage at the tumor resection site.                CT brain w/o (07/08/18): Postoperative changes from decompressive craniectomy with interval mild extracalvarial brain herniation through the craniectomy site.  Mild increase in cerebral edema at the tumor resection bed and adjacent parenchyma without significant increase in hemorrhage.                MRI brain w/wo (07/06/18): Acute postoperative changes related to gross total resection of a left medial tentorial meningioma with previously seen mass effect on the left thalamus, left middle cerebral peduncle and left midbrain.  Hemorrhagic products are identified within the resection cavity extending into the parenchyma of the left thalamus and tracking down within the left midbrain and extending to the fourth ventricle.                CT brain w/o (07/06/18): Postoperative changes from left decompressive craniectomy for evacuation of hematoma of tumor bed with residual hemorrhage and improved edema and rightward midline shift.                CT brain w/o (07/06/18): Postoperative changes from decompressive craniectomy for evacuation of tumor bed hematoma with minimal residual blood at the surgical site.  -- Pain/spasm control: Tylenol PRN, Norco PRN  -- Diet: NPO + Osmolite TF  -- Bowel regimen, last BM 07/09/18  -- Abx: Cefepime (empiric)                -- Blood culture (07/07/18): NGTD                -- Urine culture (07/07/18): NGTD  -- Activity: ambulate as tolerated w/ assist                -- Helmet at all times when out of bed  -- PT/OT: Acute Rehab  -- DVT ppx:  SCDs/Venodynes & Lovenox  -- Consults: None  -- Lines/Drains: OG, ETT  -- Wound: cranial staples  -- Disposition: ongoing    Hipolito Bayley, MD PhD

## 2018-07-11 NOTE — Progress Notes (Signed)
[facility]  NEUROSURGERY   PROGRESS NOTE      [name], [name], 57 y.o. female  Date of Admission:  07/05/2018  Date of Service: 07/12/2018  Date of Birth:  05/26/1961    Referring Physician:  No ref. provider found    Post Op Day: 7 Days Post-Op S/P Procedure(s) (LRB):  CRANIOTOMY (Left)    Chief Complaint: Left tentorial meningioma  Subjective: NAEO    Vital Signs:  Temp (24hrs) Max:39.3 C (664.4 F)      Systolic (03KVQ), QVZ:563 , Min:101 , OVF:643     Diastolic (32RJJ), OAC:16, Min:64, Max:140    Temp  Avg: 38.8 C (101.9 F)  Min: 38.5 C (101.3 F)  Max: 39.3 C (102.7 F)  MAP (Non-Invasive)  Avg: 102.5 mmHG  Min: 75 mmHG  Max: 154 mmHG  Pulse  Avg: 105.1  Min: 86  Max: 135  Resp  Avg: 21.1  Min: 14  Max: 30  SpO2  Avg: 98.6 %  Min: 95 %  Max: 100 %  Pain Score (Numeric, Faces): 3    Min/Max/Avg ICP/CPP last 24hrs:   No data recorded    Today's Physical Exam:  GCS 1/5/1T  Eyes closed  LOC LUE, WD RUE and BLE  PERRL  Face symmetric  +cough  Hearing grossly intact  CN _0 EOMI Unable to assess  CN 11 shrug symmetric Unable to assess  Muscle Strength Unable to assess  Unable to assess drift     -- Lumbar drain stitch in place (rapide)  -- JP drain stitch in place (rapide)  -- Incision C/D/I w/ staples    Current Medications:  acetaminophen (TYLENOL) 160 mg per 5 mL liquid - grape flavored, 650 mg, Gastric (NG, OG, PEG, GT), Q4H PRN  ascorbic acid (VITAMIN C) tablet, 500 mg, Gastric (NG, OG, PEG, GT), 2x/day  bacitracin 500 units/gram topical ointment tube, , Topical, 2x/day  cefepime (MAXIPIME) 2 g in NS 100 mL IVPB, 2 g, Intravenous, Q8H  chlorhexidine gluconate (PERIDEX) 0.12% mouthwash, 15 mL, Swish & Spit, 2x/day  enoxaparin PF (LOVENOX) 40 mg/0.4 mL SubQ injection, 40 mg, Subcutaneous, Q24H  famotidine (PEPCID) tablet, 20 mg, Gastric (NG, OG, PEG, GT), 2x/day  fentaNYL (SUBLIMAZE) 50 mcg/mL injection, 25 mcg, Intravenous, Q1H PRN  hydrALAZINE (APRESOLINE) injection 10 mg, 10 mg,  Intravenous, Q4H PRN  HYDROcodone-acetaminophen (NORCO) 5-325 mg per tablet, 1 Tab, Gastric (NG, OG, PEG, GT), Q4H PRN    Or  HYDROcodone-acetaminophen (NORCO) 5-325 mg per tablet, 2 Tab, Gastric (NG, OG, PEG, GT), Q4H PRN  labetalol (TRANDATE) 5 mg/mL injection, 10 mg, Intravenous, Q1H PRN  lanolin-oxyquin-pet, hydrophil (BAG BALM) topical ointment, , Apply Topically, 2x/day PRN  levETIRAcetam (KEPPRA) tablet 750 mg, 750 mg, Gastric (NG, OG, PEG, GT), 2x/day  lisinopril (PRINIVIL) tablet, 40 mg, Gastric (NG, OG, PEG, GT), Daily  metoprolol tartrate (LOPRESSOR) tablet, 25 mg, Gastric (NG, OG, PEG, GT), 2x/day  morphine 2 mg/mL injection, 2 mg, Intravenous, Q4H PRN  nicotine (NICODERM CQ) transdermal patch (mg/24 hr), 14 mg, Transdermal, Daily  NS flush syringe, 2 mL, Intracatheter, Q8HRS    And  NS flush syringe, 2-6 mL, Intracatheter, Q1 MIN PRN  nutrition protein supplement 15 g per 30 mL packet, 1 Packet, Gastric (NG, OG, PEG, GT), 2x/day  nystatin (NYSTOP) 100,000 units/g topical powder, , Apply Topically, 2x/day  ondansetron (ZOFRAN) 2 mg/mL injection, 4 mg, Intravenous, Q6H PRN  polyethylene glycol (MIRALAX) oral packet, 17 g, Gastric (NG, OG, PEG, GT), Daily  potassium bicarbonate-citric acid (EFFER-K) effervescent tablet, 40 mEq, Gastric (NG, OG, PEG, GT), Daily with Breakfast  senna concentrate (SENNA) 546m per 196moral liquid, 10 mL, Gastric (NG, OG, PEG, GT), 2x/day  SSIP insulin lispro (HUMALOG) 100 units/mL injection, 0-12 Units, Subcutaneous, Q4H PRN        I/O:  I/O last 24 hours:      Intake/Output Summary (Last 24 hours) at 07/12/2018 0333  Last data filed at 07/12/2018 0000  Gross per 24 hour   Intake 567 ml   Output --   Net 567 ml     I/O current shift:  12/19 1900 - 12/20 0659  In: 507 [O[GL:875]Out: -     Antibiotics: Date Started Date Completed   1.     2.     3.     4.       Nutrition/Residuals:  MNT PROTOCOL FOR DIETITIAN  DIET NPO - NOW EXCEPT TUBE FEEDS  ADULT TUBE FEED - BOLUS Q4H WHILE  AWAKE NO MEALS, TF ONLY; OSMOLITE 1.5; NG; BOLUS; Bolus Amount: 1 can 5 times daily; Flush Tube with water (select amt): 200 ml; Flush Frequency: Q6H    Labs  Please indicate ordered or reviewed)  Reviewed:   Lab Results Today:    Results for orders placed or performed during the hospital encounter of 07/05/18 (from the past 24 hour(s))   POC BLOOD GLUCOSE (RESULTS)   Result Value Ref Range    GLUCOSE, POC 153 (H) 70 - 105 mg/dl   ARTERIAL BLOOD GAS   Result Value Ref Range    %FIO2 (ARTERIAL) 30 %    BICARBONATE (ARTERIAL) 27.6 (H) 18.0 - 26.0 mmol/L    PCO2 (ARTERIAL) 36.0 35.0 - 45.0 mm/Hg    PH (ARTERIAL) 7.48 (H) 7.35 - 7.45    PO2 (ARTERIAL) 88.0 72.0 - 100.0 mm/Hg    BASE EXCESS (ARTERIAL) 3.4 (H) 0.0 - 1.0 mmol/L    PAO2/FIO2 RATIO 293 <=200   POC BLOOD GLUCOSE (RESULTS)   Result Value Ref Range    GLUCOSE, POC 148 (H) 70 - 105 mg/dl   BRONCHOALVEOLAR LAVAGE (BAL) QUANTITATIVE CULTURE/GRAM STAIN   Result Value Ref Range    GRAM STAIN 4+ Many PMNs     GRAM STAIN 1+ Rare Gram Negative Rod    ECG 12-LEAD   Result Value Ref Range    Ventricular rate 111 BPM    Atrial Rate 111 BPM    PR Interval 120 ms    QRS Duration 90 ms    QT Interval 340 ms    QTC Calculation 462 ms    Calculated P Axis -24 degrees    Calculated R Axis -30 degrees    Calculated T Axis -29 degrees   POC BLOOD GLUCOSE (RESULTS)   Result Value Ref Range    GLUCOSE, POC 110 (H) 70 - 105 mg/dl   URINALYSIS, MACROSCOPIC   Result Value Ref Range    SPECIFIC GRAVITY 1.014 1.005 - 1.030    GLUCOSE Negative Negative mg/dL    PROTEIN Negative Negative mg/dL    BILIRUBIN Negative Negative mg/dL    UROBILINOGEN Negative Negative mg/dL    PH 8.0 5.0 - 8.0    BLOOD Negative Negative mg/dL    KETONES Negative Negative mg/dL    NITRITE Negative Negative    LEUKOCYTES Negative Negative WBCs/uL    APPEARANCE Clear Clear    COLOR Normal (Yellow) Normal (Yellow)   URINALYSIS, MICROSCOPIC   Result Value Ref  Range    WBCS 2.0 <11.0 /hpf    RBCS 0.0 <6.0 /hpf     BACTERIA Occasional or less Occasional or less /hpf    HYALINE CASTS 2.0 <4.0 /lpf   POC BLOOD GLUCOSE (RESULTS)   Result Value Ref Range    GLUCOSE, POC 168 (H) 70 - 105 mg/dl   BASIC METABOLIC PANEL   Result Value Ref Range    SODIUM 142 136 - 145 mmol/L    POTASSIUM 3.5 3.5 - 5.1 mmol/L    CHLORIDE 107 96 - 111 mmol/L    CO2 TOTAL 27 22 - 32 mmol/L    ANION GAP 8 4 - 13 mmol/L    CALCIUM 8.8 8.5 - 10.2 mg/dL    GLUCOSE 116 65 - 139 mg/dL    BUN 15 8 - 25 mg/dL    CREATININE 0.59 0.49 - 1.10 mg/dL    BUN/CREA RATIO 25 (H) 6 - 22    ESTIMATED GFR >60 >60 mL/min/1.4m2   MAGNESIUM   Result Value Ref Range    MAGNESIUM 2.0 1.6 - 2.6 mg/dL   PHOSPHORUS   Result Value Ref Range    PHOSPHORUS 2.8 2.4 - 4.7 mg/dL   CBC   Result Value Ref Range    WBC 11.4 (H) 3.7 - 11.0 x10^3/uL    RBC 2.37 (L) 3.85 - 5.22 x10^6/uL    HGB 7.0 (L) 11.5 - 16.0 g/dL    HCT 22.1 (L) 34.8 - 46.0 %    MCV 93.2 78.0 - 100.0 fL    MCH 29.5 26.0 - 32.0 pg    MCHC 31.7 31.0 - 35.5 g/dL    RDW-CV 15.1 11.5 - 15.5 %    PLATELETS 143 (L) 150 - 400 x10^3/uL    MPV 11.4 8.7 - 12.5 fL   POC BLOOD GLUCOSE (RESULTS)   Result Value Ref Range    GLUCOSE, POC 112 (H) 70 - 105 mg/dl       Assessment/ Plan:  [name] Delirais a 57y.o. female s/p left temporal craniotomy for left tentorial meningioma resection s/p left craniectomy for evacuation of tumor bed hematoma. POD7/6  -- Keppra 500 mg BID x 7 days; end-date today  -- Please bathe patient daily and clean wound daily              -- Okay for soap/shampoo              -- Apply bacitracin ointment BID, cleaning old ointment off before application of new ointment    -- Imaging:    CT brain w/o (07/11/18): stable postoperative changes              CT brain w/o (07/09/18 @ 1328): Stable postoperative changes from left decompressive craniectomy with persistent mild extracalvarial brain herniation.  Persistent cerebral edema and hemorrhage at the tumor resection site.  No new area of hemorrhage.               CT brain w/o (07/09/18 @ 0430): Stable postoperative changes from left decompressive craniectomy with persistent mild extracalvarial brain herniation through craniectomy site.  Unchanged cerebral edema and hemorrhage at the tumor resection site.              CT brain w/o (07/08/18): Postoperative changes from decompressive craniectomy with interval mild extracalvarial brain herniation through the craniectomy site.  Mild increase in cerebral edema at the tumor resection bed and adjacent parenchyma without significant increase in hemorrhage.  MRI brain w/wo (07/06/18): Acute postoperative changes related to gross total resection of a left medial tentorial meningioma with previously seen mass effect on the left thalamus, left middle cerebral peduncle and left midbrain.  Hemorrhagic products are identified within the resection cavity extending into the parenchyma of the left thalamus and tracking down within the left midbrain and extending to the fourth ventricle.              CT brain w/o (07/06/18): Postoperative changes from left decompressive craniectomy for evacuation of hematoma of tumor bed with residual hemorrhage and improved edema and rightward midline shift.              CT brain w/o (07/06/18): Postoperative changes from decompressive craniectomy for evacuation of tumor bed hematoma with minimal residual blood at the surgical site.  -- Pain/spasm control: Tylenol PRN, Norco PRN, fentanyl PRN  -- Diet: NPO + Osmolite TF + FWF 200cc Q4H  -- Bowel regimen, last BM 07/11/18  -- Abx: Cefepime empiric PNA, end 12/23   -- Blood Cx (07/12/18): in process   -- Blood Cx (07/11/18): NGTD              -- Blood Cx (07/07/18): NGTD   -- BAL (07/11/18): GNR              -- Urine Cx (07/07/18): negative  -- Activity: ambulate as tolerated w/ assist              -- Helmet at all times when out of bed  -- PT/OT: Acute Rehab  -- DVT ppx: SCDs/Venodynes, Lovenox  -- Consults: None  -- Lines/Drains: OG, ETT  --  Wound: cranial staples, lumbar drain rapide, drain suture rapide  -- Disposition: ongoing    Leta Baptist, MD  PGY-3 Neurosurgery  07/12/2018

## 2018-07-11 NOTE — Care Plan (Signed)
Pt remains on CPAP/PS PS- 8, PEEP- 5, 30%. SBT was completed this morning, but patient is unable to follow directions for accurate results. ABG was 7.48/36/88/27.6. Suctioning for large amounts of cloudy/white secretions. Will continue to follow and monitor patient's respiratory status.   07/11/18 0515   Weaning Parameters   $ Weaning Parameters S   Min. Volume 10.5 Liters   Resp Rate 27 Breath Per Minute   Avg. VT 388.89 mL   FVC   (unable to complete, pt does not follow)   NIF -60 cmH2O  (starving)   RSBI 69   Cuff Leaked Assessment Leak Present

## 2018-07-11 NOTE — Care Plan (Signed)
VENTILATOR - CPAP(PS) / SPONTANEOUS CONTINUOUS    Discontinue   Duration: Until Specified    Priority: Routine       Question Answer Comment   FIO2 (%) 30    Peep(cm/H2O) 5    Pressure Support(cm/H2O) 8    Indications IMPROVE DISTRIBUTION OF VENTILATION    Invasive/Non-Invasive INVASIVE         Patient currently being mechanically ventilated on above settings.  BAL performed during shift with sample sent to lab for analysis.  Waiting for patients mental status to improve for further extubation evaluation.  RT will continue to monitor patient.

## 2018-07-12 ENCOUNTER — Inpatient Hospital Stay (HOSPITAL_COMMUNITY): Payer: PPO

## 2018-07-12 DIAGNOSIS — I340 Nonrheumatic mitral (valve) insufficiency: Secondary | ICD-10-CM

## 2018-07-12 DIAGNOSIS — C642 Malignant neoplasm of left kidney, except renal pelvis: Secondary | ICD-10-CM

## 2018-07-12 DIAGNOSIS — R000 Tachycardia, unspecified: Secondary | ICD-10-CM

## 2018-07-12 DIAGNOSIS — J948 Other specified pleural conditions: Secondary | ICD-10-CM

## 2018-07-12 DIAGNOSIS — R079 Chest pain, unspecified: Secondary | ICD-10-CM

## 2018-07-12 LAB — POC BLOOD GLUCOSE (RESULTS)
GLUCOSE, POC: 112 mg/dL — ABNORMAL HIGH (ref 70–105)
GLUCOSE, POC: 123 mg/dL — ABNORMAL HIGH (ref 70–105)
GLUCOSE, POC: 128 mg/dL — ABNORMAL HIGH (ref 70–105)
GLUCOSE, POC: 204 mg/dL — ABNORMAL HIGH (ref 70–105)

## 2018-07-12 LAB — BRONCHOALVEOLAR LAVAGE (BAL) QUANTITATIVE CULTURE/GRAM STAIN: BRONCHOALVEOLAR LAVAGE (BAL) QUANTITATIVE CULTURE: <100

## 2018-07-12 LAB — MAGNESIUM: MAGNESIUM: 2 mg/dL (ref 1.6–2.6)

## 2018-07-12 LAB — CBC
HCT: 22.1 % — ABNORMAL LOW (ref 34.8–46.0)
HGB: 7 g/dL — ABNORMAL LOW (ref 11.5–16.0)
MCH: 29.5 pg (ref 26.0–32.0)
MCHC: 31.7 g/dL (ref 31.0–35.5)
MCV: 93.2 fL (ref 78.0–100.0)
MPV: 11.4 fL (ref 8.7–12.5)
PLATELETS: 143 x10ˆ3/uL — ABNORMAL LOW (ref 150–400)
RBC: 2.37 10*6/uL — ABNORMAL LOW (ref 3.85–5.22)
RDW-CV: 15.1 % (ref 11.5–15.5)
WBC: 11.4 10*3/uL — ABNORMAL HIGH (ref 3.7–11.0)

## 2018-07-12 LAB — VENOUS BLOOD GAS
%FIO2 (VENOUS): 30 %
BASE EXCESS: 3.3 mmol/L — ABNORMAL HIGH (ref ?–3.0)
BICARBONATE (VENOUS): 26 mmol/L (ref 22.0–26.0)
PCO2 (VENOUS): 41 mmHg (ref 41.00–51.00)
PH (VENOUS): 7.44 — ABNORMAL HIGH (ref 7.31–7.41)
PO2 (VENOUS): 23 mmHg — ABNORMAL LOW (ref 35.0–50.0)

## 2018-07-12 LAB — BASIC METABOLIC PANEL
ANION GAP: 8 mmol/L (ref 4–13)
BUN/CREA RATIO: 25 — ABNORMAL HIGH (ref 6–22)
BUN: 15 mg/dL (ref 8–25)
CALCIUM: 8.8 mg/dL (ref 8.5–10.2)
CHLORIDE: 107 mmol/L (ref 96–111)
CO2 TOTAL: 27 mmol/L (ref 22–32)
CREATININE: 0.59 mg/dL (ref 0.49–1.10)
ESTIMATED GFR: >60 mL/min/1.73mˆ2 (ref >60)
GLUCOSE: 116 mg/dL (ref 65–139)
POTASSIUM: 3.5 mmol/L (ref 3.5–5.1)
SODIUM: 142 mmol/L (ref 136–145)

## 2018-07-12 LAB — PHOSPHORUS: PHOSPHORUS: 2.8 mg/dL (ref 2.4–4.7)

## 2018-07-12 LAB — ADULT ROUTINE BLOOD CULTURE, SET OF 2 BOTTLES (BACTERIA AND YEAST)
BLOOD CULTURE, ROUTINE: NO GROWTH
BLOOD CULTURE, ROUTINE: NO GROWTH

## 2018-07-12 LAB — ECG 12-LEAD
Atrial Rate: 115 {beats}/min
Calculated P Axis: 41 degrees
Calculated T Axis: 50 degrees
PR Interval: 128 ms
QRS Duration: 80 ms
QT Interval: 304 ms
QTC Calculation: 420 ms
Ventricular rate: 115 {beats}/min

## 2018-07-12 LAB — H & H
HCT: 24.3 % — ABNORMAL LOW (ref 34.8–46.0)
HCT: 24.8 % — ABNORMAL LOW (ref 34.8–46.0)
HGB: 7.8 g/dL — ABNORMAL LOW (ref 11.5–16.0)
HGB: 8.1 g/dL — ABNORMAL LOW (ref 11.5–16.0)

## 2018-07-12 LAB — TROPONIN-I
TROPONIN I: 50 ng/L — ABNORMAL HIGH (ref 0–30)
TROPONIN I: 45 ng/L — ABNORMAL HIGH (ref 0–30)

## 2018-07-12 MED ORDER — PERFLUTREN LIPID MICROSPHERES 1.1 MG/ML INTRAVENOUS SUSPENSION
2.00 mL | INTRAVENOUS | Status: DC
Start: 2018-07-12 — End: 2018-08-01

## 2018-07-12 MED ORDER — METOPROLOL TARTRATE 50 MG TABLET
50.00 mg | ORAL_TABLET | Freq: Two times a day (BID) | ORAL | Status: DC
Start: 2018-07-12 — End: 2018-07-12

## 2018-07-12 MED ORDER — LORAZEPAM 2 MG/ML INJECTION SOLUTION
2.0000 mg | INTRAMUSCULAR | Status: AC
Start: 2018-07-12 — End: 2018-07-12
  Administered 2018-07-12: 01:00:00 2 mg via INTRAVENOUS
  Filled 2018-07-12: qty 1

## 2018-07-12 MED ORDER — SODIUM CHLORIDE 0.9 % IV BOLUS
40.00 mL | INJECTION | Freq: Once | Status: AC | PRN
Start: 2018-07-12 — End: 2018-07-12

## 2018-07-12 MED ORDER — NYSTATIN 100,000 UNIT/ML ORAL SUSPENSION
5.0000 mL | Freq: Four times a day (QID) | ORAL | Status: DC
Start: 2018-07-12 — End: 2018-08-08
  Administered 2018-07-12: 0 mL via ORAL
  Administered 2018-07-12 – 2018-07-25 (×54): 5 mL via ORAL
  Administered 2018-07-26: 0 mL via ORAL
  Administered 2018-07-26 (×2): 5 mL via ORAL
  Administered 2018-07-26: 0 mL via ORAL
  Administered 2018-07-27 – 2018-07-29 (×9): 5 mL via ORAL
  Administered 2018-07-29: 0 mL via ORAL
  Administered 2018-07-29 – 2018-08-06 (×32): 5 mL via ORAL
  Administered 2018-08-07: 0 mL via ORAL
  Administered 2018-08-07: 5 mL via ORAL
  Administered 2018-08-07 (×2): 0 mL via ORAL
  Filled 2018-07-12 (×112): qty 5

## 2018-07-12 MED ORDER — SODIUM CHLORIDE 0.9 % (FLUSH) INJECTION SYRINGE
10.0000 mL | INJECTION | Freq: Three times a day (TID) | INTRAMUSCULAR | Status: DC
Start: 2018-07-12 — End: 2018-07-18
  Administered 2018-07-12: 0 mL
  Administered 2018-07-13: 10 mL
  Administered 2018-07-13: 0 mL
  Administered 2018-07-13 (×2): 10 mL
  Administered 2018-07-14: 0 mL
  Administered 2018-07-14 (×2): 10 mL
  Administered 2018-07-15: 0 mL
  Administered 2018-07-15 – 2018-07-16 (×3): 10 mL
  Administered 2018-07-16 (×2): 0 mL
  Administered 2018-07-17 – 2018-07-18 (×4): 10 mL

## 2018-07-12 MED ORDER — POTASSIUM BICARBONATE-CITRIC ACID 20 MEQ EFFERVESCENT TABLET
40.0000 meq | EFFERVESCENT_TABLET | Freq: Every morning | ORAL | Status: DC
Start: 2018-07-12 — End: 2018-07-12

## 2018-07-12 MED ORDER — POTASSIUM BICARBONATE-CITRIC ACID 20 MEQ EFFERVESCENT TABLET
40.0000 meq | EFFERVESCENT_TABLET | ORAL | Status: DC
Start: 2018-07-12 — End: 2018-07-12

## 2018-07-12 MED ORDER — CARVEDILOL 6.25 MG TABLET
6.2500 mg | ORAL_TABLET | Freq: Two times a day (BID) | ORAL | Status: DC
Start: 2018-07-12 — End: 2018-07-14
  Administered 2018-07-12 – 2018-07-13 (×4): 6.25 mg via GASTROSTOMY
  Administered 2018-07-14: 0 mg via GASTROSTOMY
  Filled 2018-07-12 (×4): qty 1

## 2018-07-12 MED ORDER — SODIUM CHLORIDE 0.9 % (FLUSH) INJECTION SYRINGE
20.0000 mL | INJECTION | INTRAMUSCULAR | Status: DC | PRN
Start: 2018-07-12 — End: 2018-07-18

## 2018-07-12 MED ORDER — LIDOCAINE (PF) 10 MG/ML (1 %) INJECTION SOLUTION
0.5000 mL | Freq: Once | INTRAMUSCULAR | Status: AC
Start: 2018-07-12 — End: 2018-07-12
  Administered 2018-07-12: 0.5 mL via INTRADERMAL

## 2018-07-12 MED ORDER — NICARDIPINE 25 MG/10 ML INTRAVENOUS SOLUTION
5.0000 mg/h | INTRAVENOUS | Status: DC
Start: 2018-07-12 — End: 2018-07-13
  Filled 2018-07-12: qty 20

## 2018-07-12 MED ORDER — POTASSIUM BICARBONATE-CITRIC ACID 20 MEQ EFFERVESCENT TABLET
40.00 meq | EFFERVESCENT_TABLET | ORAL | Status: AC
Start: 2018-07-12 — End: 2018-07-12
  Administered 2018-07-12: 40 meq via GASTROSTOMY
  Filled 2018-07-12: qty 2

## 2018-07-12 MED ORDER — POTASSIUM BICARBONATE-CITRIC ACID 20 MEQ EFFERVESCENT TABLET
40.00 meq | EFFERVESCENT_TABLET | Freq: Every morning | ORAL | Status: DC
Start: 2018-07-12 — End: 2018-07-29
  Administered 2018-07-12 – 2018-07-27 (×16): 40 meq via GASTROSTOMY
  Administered 2018-07-28: 0 meq via GASTROSTOMY
  Administered 2018-07-29: 40 meq via GASTROSTOMY
  Filled 2018-07-12 (×18): qty 2

## 2018-07-12 MED ADMIN — sodium chloride 0.9 % (flush) injection syringe: @ 14:00:00

## 2018-07-12 MED ADMIN — sodium chloride 0.9 % (flush) injection syringe: @ 22:00:00

## 2018-07-12 MED ADMIN — labetaloL 20 mg/4 mL (5 mg/mL) intravenous syringe: INTRAVENOUS | @ 01:00:00

## 2018-07-12 MED ADMIN — lactated Ringers intravenous solution: TOPICAL | @ 09:00:00

## 2018-07-12 MED ADMIN — hyoscyamine 0.125 mg/5 mL oral elixir: INTRADERMAL | @ 18:00:00

## 2018-07-12 MED ADMIN — sodium chloride 0.9 % (flush) injection syringe: @ 08:00:00

## 2018-07-12 NOTE — Care Plan (Signed)
Pt admitted 11/13 for tumor resection. GCS 1-5-1. Q1 neuro checks. Cooling blanket maintained for elevated temp. Ativan given for seizure activity on EEG. BP maintained with labetalol. Fentanyl PRN for pain management.   Patient Goals Based on Modifiable Risk Factors    The following modifiable risk factors were discussed with patient:     Hypertension    Goal:   Maintain blood pressure less than 140/90 (or less than 130/90 for patients with diabetes or chronic kidney disease).    Patient's selected lifestyle modification(s):  Routinely take antihypertensive medications as prescribed    Patient/Family/Caregiver response to plan:    Patient unable to respond, ETT.    Problem: Adult Inpatient Plan of Care  Goal: Plan of Care Review  Outcome: Ongoing (see interventions/notes)  Goal: Patient-Specific Goal (Individualization)  07/12/2018 0206 by Fredderick Severance, RN  Flowsheets (Taken 07/12/2018 0206)  Anxieties, Fears or Concerns: per family, concerned about what the EEG is showing  Patient-Specific Goals (Include Timeframe): per family, to have to cooling blanket off today  07/12/2018 0206 by Fredderick Severance, RN  Outcome: Ongoing (see interventions/notes)  Flowsheets (Taken 07/12/2018 0206)  Individualized Care Needs: Per family pt likes to be called "Kim"  Goal: Absence of Hospital-Acquired Illness or Injury  Outcome: Ongoing (see interventions/notes)  Goal: Optimal Comfort and Wellbeing  Outcome: Ongoing (see interventions/notes)  Goal: Rounds/Family Conference  Outcome: Ongoing (see interventions/notes)     Problem: Communication Impairment (Mechanical Ventilation, Invasive)  Goal: Effective Communication  Outcome: Ongoing (see interventions/notes)     Problem: Device-Related Complication Risk (Mechanical Ventilation, Invasive)  Goal: Optimal Device Function  Outcome: Ongoing (see interventions/notes)     Problem: Inability to Wean (Mechanical Ventilation, Invasive)  Goal: Mechanical Ventilation Liberation  Outcome:  Ongoing (see interventions/notes)     Problem: Nutrition Impairment (Mechanical Ventilation, Invasive)  Goal: Optimal Nutrition Delivery  Outcome: Ongoing (see interventions/notes)     Problem: Skin and Tissue Injury (Mechanical Ventilation, Invasive)  Goal: Absence of Device-Related Skin and Tissue Injury  Outcome: Ongoing (see interventions/notes)     Problem: Ventilator-Induced Lung Injury (Mechanical Ventilation, Invasive)  Goal: Absence of Ventilator-Induced Lung Injury  Outcome: Ongoing (see interventions/notes)     Problem: Fall Injury Risk  Goal: Absence of Fall and Fall-Related Injury  Outcome: Ongoing (see interventions/notes)     Problem: Skin Injury Risk Increased  Goal: Skin Health and Integrity  Outcome: Ongoing (see interventions/notes)     Problem: Non-violent/Non-Self Destructive Restraints  Goal: Alternative methods tried prior to restraints  Outcome: Ongoing (see interventions/notes)  Goal: Patient free from injury and discomfort  Outcome: Ongoing (see interventions/notes)  Goal: Autonomy maintained at the highest possible level  Outcome: Ongoing (see interventions/notes)  Goal: Need for restraints reassessed per policy  Outcome: Ongoing (see interventions/notes)  Goal: Patient education provided  Outcome: Ongoing (see interventions/notes)  Goal: Problem Interventions  Outcome: Ongoing (see interventions/notes)     Problem: Hypertension Comorbidity  Goal: Blood Pressure in Desired Range  Outcome: Ongoing (see interventions/notes)     Problem: Depression  Goal: Improved Mood  Outcome: Ongoing (see interventions/notes)     Problem: Feeding Intolerance (Enteral Nutrition)  Goal: Feeding Tolerance  Outcome: Ongoing (see interventions/notes)

## 2018-07-12 NOTE — Progress Notes (Signed)
[facility]  Neurocritical Care (NCCU) Progress Note    [name], [name]  Date of Admission:  07/05/2018  Date of Service: 07/12/2018  Date of Birth:  03/28/1961    Primary Attending: [name], MD   Primary Service: NEUROSURGERY [address] [place]     Chief Complaint: Right sided weakness s/p left tentorial meningioma resection      Subjective: '  Patient was evaluated at bedside.   GCS: 1/61T she is able to squeeze my hand and wiggle her toes on the left side, right side minimal withdrawal.   Overnight patient was reported to have LPDs on EEG , no clinical seizures noted was given Ativan x 1  Patient Keppra was increased to 750 mg BID yesterday.   Patient had a bowel movement this morning  Patient has been incontinent   Blood pressure has been elevated this morning; was started on Nicardipine ; plan to wean off as metoprolol was increased.      Vital Signs:  Temp (24hrs) Max:39.3 C (161.0 F)      Systolic (96EAV), WUJ:811 , Min:101 , BJY:782     Diastolic (95AOZ), HYQ:65, Min:64, Max:140    Temp  Avg: 38.9 C (102.1 F)  Min: 38.7 C (101.7 F)  Max: 39.3 C (102.7 F)  MAP (Non-Invasive)  Avg: 102.5 mmHG  Min: 75 mmHG  Max: 154 mmHG  Pulse  Avg: 105.2  Min: 86  Max: 135  Resp  Avg: 21.3  Min: 14  Max: 30  SpO2  Avg: 98.4 %  Min: 94 %  Max: 100 %  Pain Score (Numeric, Faces): 3    Current Medications:  acetaminophen (TYLENOL) 160 mg per 5 mL liquid - grape flavored, 650 mg, Gastric (NG, OG, PEG, GT), Q4H PRN  ascorbic acid (VITAMIN C) tablet, 500 mg, Gastric (NG, OG, PEG, GT), 2x/day  bacitracin 500 units/gram topical ointment tube, , Topical, 2x/day  cefepime (MAXIPIME) 2 g in NS 100 mL IVPB, 2 g, Intravenous, Q8H  chlorhexidine gluconate (PERIDEX) 0.12% mouthwash, 15 mL, Swish & Spit, 2x/day  enoxaparin PF (LOVENOX) 40 mg/0.4 mL SubQ injection, 40 mg, Subcutaneous, Q24H  famotidine (PEPCID) tablet, 20 mg, Gastric (NG, OG, PEG, GT), 2x/day  fentaNYL (SUBLIMAZE) 50 mcg/mL injection, 25 mcg,  Intravenous, Q1H PRN  hydrALAZINE (APRESOLINE) injection 10 mg, 10 mg, Intravenous, Q4H PRN  HYDROcodone-acetaminophen (NORCO) 5-325 mg per tablet, 1 Tab, Gastric (NG, OG, PEG, GT), Q4H PRN    Or  HYDROcodone-acetaminophen (NORCO) 5-325 mg per tablet, 2 Tab, Gastric (NG, OG, PEG, GT), Q4H PRN  labetalol (TRANDATE) 5 mg/mL injection, 10 mg, Intravenous, Q1H PRN  lanolin-oxyquin-pet, hydrophil (BAG BALM) topical ointment, , Apply Topically, 2x/day PRN  levETIRAcetam (KEPPRA) tablet 750 mg, 750 mg, Gastric (NG, OG, PEG, GT), 2x/day  lisinopril (PRINIVIL) tablet, 40 mg, Gastric (NG, OG, PEG, GT), Daily  metoprolol tartrate (LOPRESSOR) tablet, 50 mg, Gastric (NG, OG, PEG, GT), 2x/day  morphine 2 mg/mL injection, 2 mg, Intravenous, Q4H PRN  niCARdipine (CARDENE) 50 mg in NS 250 mL (tot vol) infusion, 5 mg/hr, Intravenous, Continuous  nicotine (NICODERM CQ) transdermal patch (mg/24 hr), 14 mg, Transdermal, Daily  NS flush syringe, 2 mL, Intracatheter, Q8HRS    And  NS flush syringe, 2-6 mL, Intracatheter, Q1 MIN PRN  nutrition protein supplement 15 g per 30 mL packet, 1 Packet, Gastric (NG, OG, PEG, GT), 2x/day  nystatin (NYSTOP) 100,000 units/g topical powder, , Apply Topically, 2x/day  ondansetron (ZOFRAN) 2 mg/mL injection, 4 mg, Intravenous, Q6H PRN  polyethylene glycol (MIRALAX) oral packet, 17 g, Gastric (NG, OG, PEG, GT), Daily  potassium bicarbonate-citric acid (EFFER-K) effervescent tablet, 40 mEq, Gastric (NG, OG, PEG, GT), Daily with Breakfast  senna concentrate (SENNA) 554m per 153moral liquid, 10 mL, Gastric (NG, OG, PEG, GT), 2x/day  SSIP insulin lispro (HUMALOG) 100 units/mL injection, 0-12 Units, Subcutaneous, Q4H PRN      Today's Physical Exam:   General: Appears very ill     Neurologic:     Mental status:   Patient is not alert, intubated, not on any sedation.  does not open her eyes , follows commands by squeezing my hand and wiggling her toes on the left side: GCS E1 V1T M6= 8T    Cranial nerves:    Pupils are sluggish , extraocular movements could not be asses, visual fields are absent to confrontation , no nystagmus noted, facial sensation could not be assess, face is symmetric , hearing g appears to be normal as she responded to verbal stimuli by squeezing my hand,Patient is intubated; could not asses palate movement and speech. Patient moves her left sided intermittently , follows commands on the left side by squeezing my hand and wiggling her left toes, no movement in the right side was noted, it withdraws on noxious stimulus minimally , no involuntary movements were noted .   Coordination: could not  assess; no tremors noted   Gait: could not  assess     Cardiovascular:    Heart regular rate and rhythm  Lungs: diminished bilaterally  Abdomen: soft, non-tender and bowel sounds normal  Extremities: no cyanosis or edema  Skin: Skin warm and dry    Labs:  I have reviewed all lab results.    I/O:  I/O last 24 hours:      Intake/Output Summary (Last 24 hours) at 07/12/2018 0707  Last data filed at 07/12/2018 0000  Gross per 24 hour   Intake 507 ml   Output --   Net 507 ml     I/O current shift:  No intake/output data recorded.    Drips:   Current Facility-Administered Medications   Medication Dose Frequency Last Rate   . niCARdipine (CARDENE) 50 mg in NS 250 mL (tot vol) infusion  5 mg/hr Continuous           Radiology Results:  MRI brain w/wo contrast 07/06/18: Pending final read    Other Testing:   See below    Microbiology:   See below       Impression and Plan:    Active Hospital Problems    Diagnosis   . 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 Tobacco use disorder   . Hypokalemia     [age]ears old female with progressive R sided weakness s/p left meningioma resection s/p Craniectomy for evacuation of tumor bed hematoma .     Neurologic:  L Tentorial meningioma s/p craniotomy for tumor resection, s/p evacuation of post-op hemorrhage;  POD#2/POD#1  -- [facility] at risk for acute neurological decline  -- MRI brain w/wo contrast 07/06/18: Post-op changes following resection, concern for infarct left midbrain, will f/u final read  - JP drain removed on 07/07/18  - Lumbar drain removed on 07/06/18  -- CT brain 07/08/18 : mild increase in cerebral edema   -- patient is on VEEG follow up on the report   - continue Keppra 750 mg BID   -- STAT  CT brain without contrast for any acute change in neurological status     Pain/sedation  -- Tylenol PRN      Cardiovascular:    HTN, chronic  -BP 132/69, HR 91  -- SBP goal < 160  -- Hydralazine/labetalol (x1 overnight) PRN  -- Current antihypertensives:  -- Metoprolol increased to 50 mg BID     Respiratory:    Acute respiratory failure requiring mechanical ventilation  Ventilator Settings:  Set PEEP: 5 cmH2O  Pressure Support: 8 cmH2O  FiO2: 30 %  -- CXR on 07/06/18: No acute process  -- Weaning parameters ordered for 12/16 AM    Renal:no active issues   -BUN/Cr: 15/0.59, electrolytes are WNL   - continue to monitor electrolytes and replete accordingly   -FWF 200 cc Q6H    hypophosphatemia  -- Repleted    Infectious Disease:   Leukocytosis-improved  -- Tmax 39.3; now on cooling   -- Cultures:    -- [facility] 07/07/18:   -ve    -- Urine culture 07/07/18:No UTI    -- MRSA screen 07/07/18: Negative   -- BAL 07/07/18: -- Current meds: cefepime 2 g q8 hours day 4 EOT 12/22      Hematology:Normocytic anemia   -h/h today 7.8/24.3  -continue to monitor and transfuse if hb<7    Thrombocytopenia, acute could be dilutional  Improved  -Platelete count tody 143  -continue to monitor daily     DVT prophylaxis  lovenox       Gastrointestinal:    At risk for constipation  -continue Osmolite 1.5 Q4   -patient has Flexi-seal tube in   -- Miralax/senna scheduled    Endocrine:  No acute issues  -- SSIP conservative      Psychiatric:  No acute issues  -- Monitor for ICU delirium       Prophylaxis:    DVT/PE  Prophylaxis: SCDs/ Venodynes/Impulse boots  GI: Famotidine  Seizure: Keppra     Family:  None present at bedside  Disposition/ Baseline:  ICU  Advance Directives:  None-Discussed  Hospice involvement prior to admission?  no    Greggory Brandy, MD   [facility] neurology PGY-2   Pager [phone]        I saw and examined the patient on 07/12/18.  I reviewed the resident's note.  I agree with the findings including assessment and plan as documented in the resident's note.  Any exceptions/additions are noted .    The following organ systems are impaired causing an immanent threat to his life or potentially causing his condition to deteriorate.  The treatment of these conditions required highly complex decision making to manipulate and support his organ damage.     Neurological- left tentorial meningioma s/p resection with post op hemorrhage and craniectomy.  Seizures and PLEDs.  No seizure activity, will d/c EEG today.  Neuroexam stable.   Cardiovascular-goal SBP<160.    Pulmonological-acute respiratory failure requiring mechanical ventilation, weaning parameters remain good, but still with copious secreations, not ready for extubation.   Gastro-intestinal-dysphagia, Nutrition supported while patient with dysphagia and remains intubated/unable to swallow to prevent malnutrition.    Infectious disease- conitnue cefepime until stop date.     The patients disposition is ICU     Critical Care Time: 30 minutes    Merilyn Baba, MD

## 2018-07-12 NOTE — Care Plan (Signed)
Medical Nutrition Therapy Assessment        SUBJECTIVE : Pt remains in NCCU and intubated, continues to receive bolus tube feeds which are running at goal rate, per nursing staff pt is tolerating tube feeds without any current concerns.     OBJECTIVE:     Current Diet Order/Nutrition Support:   BOLUS Q4H WHILE AWAKE OSMOLITE 1.5; NG; 5 times daily; Flush Tube: 200 ml; Q6H    Height Used for Calculations: 167.6 cm (5' 5.98")  Weight Used For Calculations: 78.2 kg (172 lb 6.4 oz)(standing weight upon admission)  BMI (kg/m2): 27.9  BMI Assessment: BMI 25-29.9: overweight  Ideal Body Weight (IBW) (kg): 59.54  % Ideal Body Weight: 131.34  Adjusted/Standard Body Weight  Adjusted BW: 64 kg          Estimated Needs:    Energy Calorie Requirements: 0865-7846 cal/kg(25-30 cal/78.2kg BW)    Protein Requirements (gms/day): 90-120 g/pro/day(1.5-2 g/pro/59 kg IBW) per day     Plan/Interventions :   1. Continue tube feeds with Osmolite 1.5, bolus 1 can 5 times daily + 1 packet of PROSource BID to provide:        1920 cal = 25 cals/kg        105 g protein = 1.8 g/kg         914 ml free water  2. Monitor I/O's for tube feed tolerance, provide free water flushes PRN  3. Monitor daily weight trends  4. Continue daily bowel regimen   5. Continue 500 mg Vitamin C BID  6. Advance diet per SLP rec's as medically appropriate     Nutrition Diagnosis: Inability to swallow related to Patient on vent as evidenced by Need for TF      Tobie Lords, PhD, RDLD  07/12/2018, 08:40      Pager # [phone]

## 2018-07-12 NOTE — Care Management Notes (Signed)
[facility]  Care Management Note    Patient Name: [name]  Date of Birth: 1960-10-08  Sex: female  Date/Time of Admission: 07/05/2018  7:26 AM  Room/Bed: 11/A  Payor: HEALTH PLAN / Plan: HEALTH PLAN (PPO-NON ST EMP) / Product Type: PPO /    LOS: 7 days   Primary Care Providers:  Isac Caddy, MD, MD (General)    Admitting Diagnosis:  Meningioma (CMS [facility]) [D32.9]    Assessment:      07/12/18 1329   Assessment Details   Assessment Type Continued Assessment   Date of Care Management Update 07/12/18   Date of Next DCP Update 07/15/18   Care Management Plan   Discharge Planning Status plan in progress   Projected Discharge Date 07/16/18   Discharge Needs Assessment   Discharge Facility/Level of Care Needs Undetermined at this time   Patient is not medically ready for discharge at this time.  Patient requiring ventilator support and Nicardipine drip.  Plan is to monitor patient, vent support,  labs, medication management, PT/OT.    Discharge Plan:  Undetermined at this time  Discharge plans undetermined pending patient progress and PT/OT recommendations.    The patient will continue to be evaluated for developing discharge needs.     Case Manager: Claudius Sis, [place] COORDINATOR  Phone: [phone]

## 2018-07-12 NOTE — Nurses Notes (Signed)
Restraint Initiation    Patient assessed and found to have the following condition AMS, pulling at airway.        Least restrictive alternatives attempted: concealed tubes/lines, decreased/removed stimulus, frequent orientation, reoriented to person, place, time, and treatment, redirected behavior and assessed meds/medical problems.    Patient is exhibiting the following behaviors: uncooperative.    The reason for application of restraint: patient attempting to remove artificial airway    The restraint was applied to facilitate medical/surgical treatment to ensure safety.    The patient will continue to be evaluated and assessments documented on the flowsheet to ensure that the patient is released from the restraint at the earliest possible time.

## 2018-07-12 NOTE — Respiratory Therapy (Signed)
[name] remains intubated and on mechanical ventilation with the following settings:  CPAP/PS +5/.30/8.  SBT (+5/.30/5) was performed.  Post SBT VBG = 7.44/41/23/26/+3.3.  Weaning parameters are as follows:     07/12/18 0610   Weaning Parameters   $ Weaning Parameters S   Min. Volume 13.6 Liters   Resp Rate 26 Breath Per Minute   Avg. VT 523.08 mL   FVC   (Unable to follow commands)   NIF -60 cmH2O   RSBI 50   Cuff Leaked Assessment Leak Present   She is suctioning for copious amounts of secretions.  Will continue to follow.

## 2018-07-12 NOTE — Respiratory Therapy (Signed)
Pt currently on PSV PS 8 Peep 5 FiO2 30%. Pt suctions for large amounts of thick clear secretions through ETT and oral. Will continue to monitor.

## 2018-07-13 ENCOUNTER — Inpatient Hospital Stay (HOSPITAL_COMMUNITY): Admitting: Radiology

## 2018-07-13 DIAGNOSIS — Z978 Presence of other specified devices: Secondary | ICD-10-CM

## 2018-07-13 DIAGNOSIS — R0689 Other abnormalities of breathing: Secondary | ICD-10-CM

## 2018-07-13 LAB — CBC
HCT: 23.8 % — ABNORMAL LOW (ref 34.8–46.0)
HGB: 7.6 g/dL — ABNORMAL LOW (ref 11.5–16.0)
MCH: 29.1 pg (ref 26.0–32.0)
MCHC: 31.9 g/dL (ref 31.0–35.5)
MCV: 91.2 fL (ref 78.0–100.0)
MCV: 91.2 fL (ref 78.0–100.0)
MPV: 10.6 fL (ref 8.7–12.5)
PLATELETS: 300 x10ˆ3/uL (ref 150–400)
RBC: 2.61 10*6/uL — ABNORMAL LOW (ref 3.85–5.22)
RDW-CV: 14.7 % (ref 11.5–15.5)
WBC: 11.5 x10ˆ3/uL — ABNORMAL HIGH (ref 3.7–11.0)

## 2018-07-13 LAB — BASIC METABOLIC PANEL
ANION GAP: 7 mmol/L (ref 4–13)
BUN/CREA RATIO: 28 — ABNORMAL HIGH (ref 6–22)
BUN: 17 mg/dL (ref 8–25)
CALCIUM: 8.8 mg/dL (ref 8.5–10.2)
CHLORIDE: 107 mmol/L (ref 96–111)
CO2 TOTAL: 28 mmol/L (ref 22–32)
CREATININE: 0.6 mg/dL (ref 0.49–1.10)
ESTIMATED GFR: >60 mL/min/1.73mˆ2 (ref >60)
GLUCOSE: 129 mg/dL (ref 65–139)
POTASSIUM: 3.9 mmol/L (ref 3.5–5.1)
SODIUM: 142 mmol/L (ref 136–145)

## 2018-07-13 LAB — VENOUS BLOOD GAS
%FIO2 (VENOUS): 30 %
BASE EXCESS: 3.7 mmol/L — ABNORMAL HIGH (ref ?–3.0)
BICARBONATE (VENOUS): 26.5 mmol/L — ABNORMAL HIGH (ref 22.0–26.0)
PCO2 (VENOUS): 43 mmHg (ref 41.00–51.00)
PH (VENOUS): 7.43 — ABNORMAL HIGH (ref 7.31–7.41)
PO2 (VENOUS): 26 mmHg — ABNORMAL LOW (ref 35.0–50.0)

## 2018-07-13 LAB — POC BLOOD GLUCOSE (RESULTS)
GLUCOSE, POC: 104 mg/dL (ref 70–105)
GLUCOSE, POC: 104 mg/dL (ref 70–105)
GLUCOSE, POC: 122 mg/dL — ABNORMAL HIGH (ref 70–105)
GLUCOSE, POC: 125 mg/dL — ABNORMAL HIGH (ref 70–105)
GLUCOSE, POC: 156 mg/dL — ABNORMAL HIGH (ref 70–105)
GLUCOSE, POC: 170 mg/dL — ABNORMAL HIGH (ref 70–105)

## 2018-07-13 LAB — MAGNESIUM: MAGNESIUM: 2 mg/dL (ref 1.6–2.6)

## 2018-07-13 LAB — H & H
HCT: 23.5 % — ABNORMAL LOW (ref 34.8–46.0)
HGB: 7.6 g/dL — ABNORMAL LOW (ref 11.5–16.0)

## 2018-07-13 LAB — PHOSPHORUS: PHOSPHORUS: 3.1 mg/dL (ref 2.4–4.7)

## 2018-07-13 MED ADMIN — insulin lispro 100 unit/mL subcutaneous solution: SUBCUTANEOUS | @ 12:00:00

## 2018-07-13 MED ADMIN — sodium chloride 0.9 % (flush) injection syringe: INTRAVENOUS | @ 06:00:00

## 2018-07-13 MED ADMIN — senna leaf extract 176 mg/5 mL oral syrup: GASTROSTOMY | @ 21:00:00

## 2018-07-13 MED ADMIN — Medication: INTRAVENOUS | @ 12:00:00

## 2018-07-13 MED ADMIN — lisinopriL 10 mg tablet: GASTROSTOMY | @ 09:00:00

## 2018-07-13 MED ADMIN — HYDROcodone 5 mg-acetaminophen 325 mg tablet: INTRAVENOUS | @ 17:00:00

## 2018-07-13 MED ADMIN — lactated Ringers intravenous solution: ORAL | @ 14:00:00 | NDC 00264775000

## 2018-07-13 MED ADMIN — rivaroxaban 15 mg tablet: @ 06:00:00

## 2018-07-13 MED ADMIN — sodium chloride 0.9 % intravenous solution: TOPICAL | @ 20:00:00 | NDC 00338004904

## 2018-07-13 NOTE — Nurses Notes (Signed)
Restraint Continuation      Patient continues to have the following condition AMS.       Least restrictive alternatives attempted : decreased/removed stimulus, frequent orientation, reoriented to person, place, time, and treatment, encouraged relaxation techniques, redirected behavior and assessed meds/medical problems    Patient continues to exhibit the following behaviors: uncooperative    The restraint continued to facilitate medical/surgical treatment to ensure safety.    The patient will continue to be evaluated and assessments documented on the flowsheet to ensure that the patient is released from the restraint at the earliest possible time

## 2018-07-13 NOTE — Respiratory Therapy (Addendum)
Pt currently on PSV PS 8 Peep 5 FiO2 30%. Pt suctioning for large amounts of thick clear oral secretions, and yellow/creamy secretions in ETT.  Will continue to monitor and wean as tolerated.

## 2018-07-13 NOTE — Progress Notes (Signed)
[facility]  NEUROSURGERY   PROGRESS NOTE      [name], [name], 57 y.o. female  Date of Admission:  07/05/2018  Date of Service: 07/13/2018  Date of Birth:  1960-12-22    Referring Physician:  No ref. provider found    Post Op Day: 8 Days Post-Op S/P Procedure(s) (LRB):  CRANIOTOMY (Left)    Chief Complaint: Left tentorial meningioma  Subjective: NAEO    Vital Signs:  Temp (24hrs) Max:38.5 C (381.0 F)      Systolic (17PZW), CHE:527 , Min:107 , POE:423     Diastolic (53IRW), ERX:54, Min:70, Max:113    Temp  Avg: 37.7 C (99.9 F)  Min: 37 C (98.6 F)  Max: 38.5 C (101.3 F)  MAP (Non-Invasive)  Avg: 97 mmHG  Min: 24 mmHG  Max: 129 mmHG  Pulse  Avg: 113.1  Min: 94  Max: 128  Resp  Avg: 20.5  Min: 16  Max: 28  SpO2  Avg: 99.3 %  Min: 94 %  Max: 100 %  Pain Score (Numeric, Faces): 3    Min/Max/Avg ICP/CPP last 24hrs:   No data recorded    Today's Physical Exam:  GCS 1/5/1T  Eyes closed  LOC LUE, WD RUE and BLE  PERRL  Face symmetric  +cough  Hearing grossly intact  CN _0 EOMI Unable to assess  CN 11 shrug symmetric Unable to assess  Muscle Strength Unable to assess  Unable to assess drift     -- Lumbar drain stitch in place (rapide)  -- JP drain stitch in place (rapide)  -- Incision C/D/I w/ staples    Current Medications:  acetaminophen (TYLENOL) 160 mg per 5 mL liquid - grape flavored, 650 mg, Gastric (NG, OG, PEG, GT), Q4H PRN  ascorbic acid (VITAMIN C) tablet, 500 mg, Gastric (NG, OG, PEG, GT), 2x/day  bacitracin 500 units/gram topical ointment tube, , Topical, 2x/day  carvedilol (COREG) tablet, 6.25 mg, Gastric (NG, OG, PEG, GT), 2x/day-Food  cefepime (MAXIPIME) 2 g in NS 100 mL IVPB, 2 g, Intravenous, Q8H  chlorhexidine gluconate (PERIDEX) 0.12% mouthwash, 15 mL, Swish & Spit, 2x/day  enoxaparin PF (LOVENOX) 40 mg/0.4 mL SubQ injection, 40 mg, Subcutaneous, Q24H  famotidine (PEPCID) tablet, 20 mg, Gastric (NG, OG, PEG, GT), 2x/day  fentaNYL (SUBLIMAZE) 50 mcg/mL injection, 25 mcg, Intravenous,  Q1H PRN  hydrALAZINE (APRESOLINE) injection 10 mg, 10 mg, Intravenous, Q4H PRN  HYDROcodone-acetaminophen (NORCO) 5-325 mg per tablet, 1 Tab, Gastric (NG, OG, PEG, GT), Q4H PRN    Or  HYDROcodone-acetaminophen (NORCO) 5-325 mg per tablet, 2 Tab, Gastric (NG, OG, PEG, GT), Q4H PRN  labetalol (TRANDATE) 5 mg/mL injection, 10 mg, Intravenous, Q1H PRN  lanolin-oxyquin-pet, hydrophil (BAG BALM) topical ointment, , Apply Topically, 2x/day PRN  levETIRAcetam (KEPPRA) tablet 750 mg, 750 mg, Gastric (NG, OG, PEG, GT), 2x/day  lisinopril (PRINIVIL) tablet, 40 mg, Gastric (NG, OG, PEG, GT), Daily  morphine 2 mg/mL injection, 2 mg, Intravenous, Q4H PRN  niCARdipine (CARDENE) 50 mg in NS 250 mL (tot vol) infusion, 5 mg/hr, Intravenous, Continuous  nicotine (NICODERM CQ) transdermal patch (mg/24 hr), 14 mg, Transdermal, Daily  NS flush syringe, 2 mL, Intracatheter, Q8HRS    And  NS flush syringe, 2-6 mL, Intracatheter, Q1 MIN PRN  NS flush syringe, 10-30 mL, Intracatheter, Q8HRS  NS flush syringe, 20-30 mL, Intracatheter, Q1 MIN PRN  nutrition protein supplement 15 g per 30 mL packet, 1 Packet, Gastric (NG, OG, PEG, GT), 2x/day  nystatin (MYCOSTATIN) 100,000  units per mL oral liquid, 5 mL, Swish & Spit, 4x/day  nystatin (NYSTOP) 100,000 units/g topical powder, , Apply Topically, 2x/day  ondansetron (ZOFRAN) 2 mg/mL injection, 4 mg, Intravenous, Q6H PRN  perflutren lipid microspheres (DEFINITY) 1.3 mL in NS 10 mL (tot vol) injection, 2 mL, Intravenous, Give in Cardiology  polyethylene glycol (MIRALAX) oral packet, 17 g, Gastric (NG, OG, PEG, GT), Daily  potassium bicarbonate-citric acid (EFFER-K) effervescent tablet, 40 mEq, Gastric (NG, OG, PEG, GT), Daily with Breakfast  senna concentrate (SENNA) 556m per 154moral liquid, 10 mL, Gastric (NG, OG, PEG, GT), 2x/day  SSIP insulin lispro (HUMALOG) 100 units/mL injection, 0-12 Units, Subcutaneous, Q4H PRN        I/O:  I/O last 24 hours:      Intake/Output Summary (Last 24 hours) at  07/13/2018 0531  Last data filed at 07/13/2018 0500  Gross per 24 hour   Intake 1233 ml   Output 1535 ml   Net -302 ml     I/O current shift:  12/20 1900 - 12/21 0659  In: 674 [OG:674]  Out: [id number][RAQTM:2263]  Antibiotics: Date Started Date Completed   1.     2.     3.     4.       Nutrition/Residuals:  MNT PROTOCOL FOR DIETITIAN  DIET NPO - NOW EXCEPT TUBE FEEDS  ADULT TUBE FEED - BOLUS Q4H WHILE AWAKE NO MEALS, TF ONLY; OSMOLITE 1.5; NG; BOLUS; Bolus Amount: 1 can 5 times daily; Flush Tube with water (select amt): 200 ml; Flush Frequency: Q6H    Labs  Please indicate ordered or reviewed)  Reviewed:   Lab Results Today:    Results for orders placed or performed during the hospital encounter of 07/05/18 (from the past 24 hour(s))   H & H   Result Value Ref Range    HGB 7.8 (L) 11.5 - 16.0 g/dL    HCT 24.3 (L) 34.8 - 46.0 %   ECG 12-LEAD   Result Value Ref Range    Ventricular rate 115 BPM    Atrial Rate 115 BPM    PR Interval 128 ms    QRS Duration 80 ms    QT Interval 304 ms    QTC Calculation 420 ms    Calculated P Axis 41 degrees    Calculated R Axis       Calculated T Axis 50 degrees   TROPONIN-I   Result Value Ref Range    TROPONIN I 50 (H) 0 - 30 ng/L   TYPE AND CROSS RED CELLS - UNITS , 1 Units   Result Value Ref Range    UNITS ORDERED 1     SPECIMEN EXPIRATION DATE 07/15/2018     ABO/RH(D) O POSITIVE     ANTIBODY SCREEN NEGATIVE    BPAM PACKED CELL ORDER   Result Value Ref Range    Coding System [id number]     UNIT NUMBER [id number]   BLOOD COMPONENT TYPE LR RBC, Adsol3, 04730     UNIT DIVISION 00     UNIT DISPENSE STATUS ISSUED     TRANSFUSION STATUS OK TO TRANSFUSE     IS CROSSMATCH Electronically Compatible     Product Code [id number]  POC BLOOD GLUCOSE (RESULTS)   Result Value Ref Range    GLUCOSE, POC 123 (H) 70 - 105 mg/dl   TROPONIN-I   Result Value Ref Range    TROPONIN I 45 (H) 0 - 30  ng/L   H & H   Result Value Ref Range    HGB 8.1 (L) 11.5 - 16.0 g/dL    HCT 24.8 (L) 34.8 - 46.0 %   POC BLOOD  GLUCOSE (RESULTS)   Result Value Ref Range    GLUCOSE, POC 128 (H) 70 - 105 mg/dl   POC BLOOD GLUCOSE (RESULTS)   Result Value Ref Range    GLUCOSE, POC 204 (H) 70 - 105 mg/dl   POC BLOOD GLUCOSE (RESULTS)   Result Value Ref Range    GLUCOSE, POC 125 (H) 70 - 105 mg/dl   BASIC METABOLIC PANEL   Result Value Ref Range    SODIUM 142 136 - 145 mmol/L    POTASSIUM 3.9 3.5 - 5.1 mmol/L    CHLORIDE 107 96 - 111 mmol/L    CO2 TOTAL 28 22 - 32 mmol/L    ANION GAP 7 4 - 13 mmol/L    CALCIUM 8.8 8.5 - 10.2 mg/dL    GLUCOSE 129 65 - 139 mg/dL    BUN 17 8 - 25 mg/dL    CREATININE 0.60 0.49 - 1.10 mg/dL    BUN/CREA RATIO 28 (H) 6 - 22    ESTIMATED GFR >60 >60 mL/min/1.88m2   CBC   Result Value Ref Range    WBC 11.5 (H) 3.7 - 11.0 x10^3/uL    RBC 2.61 (L) 3.85 - 5.22 x10^6/uL    HGB 7.6 (L) 11.5 - 16.0 g/dL    HCT 23.8 (L) 34.8 - 46.0 %    MCV 91.2 78.0 - 100.0 fL    MCH 29.1 26.0 - 32.0 pg    MCHC 31.9 31.0 - 35.5 g/dL    RDW-CV 14.7 11.5 - 15.5 %    PLATELETS 300 150 - 400 x10^3/uL    MPV 10.6 8.7 - 12.5 fL   MAGNESIUM   Result Value Ref Range    MAGNESIUM 2.0 1.6 - 2.6 mg/dL   PHOSPHORUS   Result Value Ref Range    PHOSPHORUS 3.1 2.4 - 4.7 mg/dL   VENOUS BLOOD GAS   Result Value Ref Range    %FIO2 (VENOUS) 30.0 %    BICARBONATE (VENOUS) 26.5 (H) 22.0 - 26.0 mmol/L    PCO2 (VENOUS) 43.00 41.00 - 51.00 mm/Hg    PH (VENOUS) 7.43 (H) 7.31 - 7.41    PO2 (VENOUS) 26.0 (L) 35.0 - 50.0 mm/Hg    BASE EXCESS 3.7 (H) -3.0 - 3.0 mmol/L   POC BLOOD GLUCOSE (RESULTS)   Result Value Ref Range    GLUCOSE, POC 104 70 - 105 mg/dl       Assessment/ Plan:  [name] Guilbaultis a 57y.o. female s/p left temporal craniotomy for left tentorial meningioma resection s/p left craniectomy for evacuation of tumor bed hematoma. POD8/7  -- Keppra 750 mg BID (no end date)  -- Please bathe patient daily and clean wound daily              -- Okay for soap/shampoo              -- Apply bacitracin ointment BID, cleaning old ointment off before application of  new ointment    -- Imaging:    CT brain w/o (07/11/18): stable postoperative changes              CT brain w/o (07/09/18 @ 1328): Stable postoperative changes from left decompressive craniectomy with persistent mild extracalvarial brain herniation.  Persistent cerebral edema and hemorrhage at the  tumor resection site.  No new area of hemorrhage.              CT brain w/o (07/09/18 @ 0430): Stable postoperative changes from left decompressive craniectomy with persistent mild extracalvarial brain herniation through craniectomy site.  Unchanged cerebral edema and hemorrhage at the tumor resection site.              CT brain w/o (07/08/18): Postoperative changes from decompressive craniectomy with interval mild extracalvarial brain herniation through the craniectomy site.  Mild increase in cerebral edema at the tumor resection bed and adjacent parenchyma without significant increase in hemorrhage.              MRI brain w/wo (07/06/18): Acute postoperative changes related to gross total resection of a left medial tentorial meningioma with previously seen mass effect on the left thalamus, left middle cerebral peduncle and left midbrain.  Hemorrhagic products are identified within the resection cavity extending into the parenchyma of the left thalamus and tracking down within the left midbrain and extending to the fourth ventricle.              CT brain w/o (07/06/18): Postoperative changes from left decompressive craniectomy for evacuation of hematoma of tumor bed with residual hemorrhage and improved edema and rightward midline shift.              CT brain w/o (07/06/18): Postoperative changes from decompressive craniectomy for evacuation of tumor bed hematoma with minimal residual blood at the surgical site.  -- Pain/spasm control: Tylenol PRN, Norco PRN, fentanyl PRN  -- Diet: NPO + Osmolite TF + FWF 200cc Q4H  -- Bowel regimen, last BM 07/11/18  -- Abx: Cefepime empiric PNA, end 12/23   -- Blood Cx (07/12/18):  NGTD   -- Blood Cx (07/11/18): NGTD              -- Blood Cx (07/07/18): NGTD   -- BAL (07/11/18): normal oral flora              -- Urine Cx (07/07/18): negative  -- Activity: ambulate as tolerated w/ assist              -- Helmet at all times when out of bed  -- PT/OT: Acute Rehab  -- DVT ppx: SCDs/Venodynes, Lovenox  -- Consults: None  -- Lines/Drains: OG, ETT  -- Wound: cranial staples, lumbar drain rapide, drain suture rapide  -- Disposition: ongoing    Keane Police, M.D.  PGY-2 Neurosurgery  07/13/2018, 05:35       I saw and examined the patient.  I reviewed the resident's note.  I agree with the findings and plan of care as documented in the resident's note.  Any exceptions/additions are edited/noted.    Merilyn Baba, MD

## 2018-07-13 NOTE — Respiratory Therapy (Signed)
[name] remains intubated and on mechanical ventilation with the following settings:  CPAP/PS +5/.30/8.  SBT (+5/.30/5) was performed.  Post SBT VBG = 7.43/43/26/26.5/+3.7.  Weaning parameters are as follows:     07/13/18 0500   Weaning Parameters   $ Weaning Parameters S   Min. Volume 13.5 Liters   Resp Rate 30 Breath Per Minute   Avg. VT 450 mL   FVC   (Unable to follow commands)   NIF -60 cmH2O   RSBI 67   Cuff Leaked Assessment Leak Present   She is not following commands.  She is suctioning for copious amounts of secretions.  Will continue to follow.

## 2018-07-13 NOTE — Care Plan (Signed)
[name] VS/I&O assessments. Neuro assessments per MD order. GCS 1-5-1. PRN BP medication admin x1. Pt tolerated well. Turned q2h. NAD. VSS.    Patient Goals Based on Modifiable Risk Factors    The following modifiable risk factors were discussed with patient:     Hypertension    Goal:   Maintain blood pressure less than 140/90 (or less than 130/90 for patients with diabetes or chronic kidney disease).    Patient's selected lifestyle modification(s):  Routinely take antihypertensive medications as prescribed      Hypercholesteremia    Goal(s):  LDL less than 100    Patient's selected lifestyle modification(s):  Take lipid lowering medications as prescribed      Diabetes Mellitus    Goal:   HbA1C less than 7 percent    Patient's selected lifestyle modification(s):  Consistently monitor blood glucose and take medications as ordered      Cigarette Smoking    Goal(s):  Complete Cessation    Patient's selected lifestyle modification(s):  Stop smoking with support of counseling      Unhealthy Diet    Goal:   Maintain a healthy balanced diet.    Patient's selected lifestyle modification(s):  Eat a diet high in fresh fruit, vegetables, low-fat dairy products, fiber, whole grains and protein from plant sources and low in saturated fat, cholesterol and sodium      Physical Inactivity    Goal:   Increase daily physical activity.    Patient's selected lifestyle modification(s):  Increase daily physical activity to 30-60 minutes    Patient/Family/Caregiver response to plan:    Patient unable to participate in POC/education R/T AMS      Problem: Adult Inpatient Plan of Care  Goal: Plan of Care Review  Outcome: Ongoing (see interventions/notes)  Flowsheets (Taken 07/13/2018 0552)  Plan of Care Reviewed With: patient; caregiver; family  Goal: Patient-Specific Goal (Individualization)  Outcome: Ongoing (see interventions/notes)  Flowsheets  Taken 07/13/2018 0552 by Thelma Barge, RN  Anxieties, Fears or Concerns: Family concerned  about possible stenosis in pt's neck. Family wanting to speak to surgeon regarding stenosis.  Taken 07/12/2018 0206 by Fredderick Severance, RN  Patient-Specific Goals (Include Timeframe): per family, to have to cooling blanket off today  Goal: Absence of Hospital-Acquired Illness or Injury  Outcome: Ongoing (see interventions/notes)  Goal: Optimal Comfort and Wellbeing  Outcome: Ongoing (see interventions/notes)  Goal: Rounds/Family Conference  Outcome: Ongoing (see interventions/notes)

## 2018-07-13 NOTE — Care Plan (Signed)
Patient s/p tumor resection and crani.  GCS 1/5/1.  SBP maintained <140.  Cooling blanket removed upon goal temp of 37.5C.  LUE restraint maintained to prevent patient from pulling at mechanical ventilation.  Skin precautions maintained with turn Q2 and mepilexes.  Safety precautions maintained with hourly rounding.  Plan for trach/PEG discussion.    Patient Goals Based on Modifiable Risk Factors    The following modifiable risk factors were discussed with patient:     Hypertension    Goal:   Maintain blood pressure less than 140/90 (or less than 130/90 for patients with diabetes or chronic kidney disease).    Patient's selected lifestyle modification(s):  Routinely take antihypertensive medications as prescribed    Patient/Family/Caregiver response to plan:    Patient intubated and shows no evidence of learning at this time.  Will continue to monitor for educational opportunities.       Problem: Adult Inpatient Plan of Care  Goal: Plan of Care Review  Outcome: Ongoing (see interventions/notes)  Flowsheets (Taken 07/13/2018 1440)  Plan of Care Reviewed With: patient; family  Goal: Patient-Specific Goal (Individualization)  Outcome: Ongoing (see interventions/notes)  Flowsheets (Taken 07/13/2018 1440)  Individualized Care Needs: Per daughter, patient goes by "Kim"  Anxieties, Fears or Concerns: Per daughter, "When will this cooling blanket come off?"  Patient-Specific Goals (Include Timeframe): per daughter, to get up in the chair in the morning with the cooling blanket of.  Goal: Absence of Hospital-Acquired Illness or Injury  Outcome: Ongoing (see interventions/notes)  Goal: Optimal Comfort and Wellbeing  Outcome: Ongoing (see interventions/notes)     Problem: Communication Impairment (Mechanical Ventilation, Invasive)  Goal: Effective Communication  Outcome: Ongoing (see interventions/notes)     Problem: Device-Related Complication Risk (Mechanical Ventilation, Invasive)  Goal: Optimal Device Function  Outcome:  Ongoing (see interventions/notes)     Problem: Inability to Wean (Mechanical Ventilation, Invasive)  Goal: Mechanical Ventilation Liberation  Outcome: Ongoing (see interventions/notes)     Problem: Nutrition Impairment (Mechanical Ventilation, Invasive)  Goal: Optimal Nutrition Delivery  Outcome: Ongoing (see interventions/notes)     Problem: Skin and Tissue Injury (Mechanical Ventilation, Invasive)  Goal: Absence of Device-Related Skin and Tissue Injury  Outcome: Ongoing (see interventions/notes)     Problem: Ventilator-Induced Lung Injury (Mechanical Ventilation, Invasive)  Goal: Absence of Ventilator-Induced Lung Injury  Outcome: Ongoing (see interventions/notes)     Problem: Fall Injury Risk  Goal: Absence of Fall and Fall-Related Injury  Outcome: Ongoing (see interventions/notes)     Problem: Skin Injury Risk Increased  Goal: Skin Health and Integrity  Outcome: Ongoing (see interventions/notes)     Problem: Non-violent/Non-Self Destructive Restraints  Goal: Alternative methods tried prior to restraints  Outcome: Ongoing (see interventions/notes)  Goal: Patient free from injury and discomfort  Outcome: Ongoing (see interventions/notes)  Goal: Autonomy maintained at the highest possible level  Outcome: Ongoing (see interventions/notes)  Goal: Need for restraints reassessed per policy  Outcome: Ongoing (see interventions/notes)  Goal: Patient education provided  Outcome: Ongoing (see interventions/notes)  Goal: Problem Interventions  Outcome: Ongoing (see interventions/notes)     Problem: Hypertension Comorbidity  Goal: Blood Pressure in Desired Range  Outcome: Ongoing (see interventions/notes)     Problem: Depression  Goal: Improved Mood  Outcome: Ongoing (see interventions/notes)     Problem: Feeding Intolerance (Enteral Nutrition)  Goal: Feeding Tolerance  Outcome: Ongoing (see interventions/notes)     Problem: Bleeding (Craniotomy/Craniectomy/Cranioplasty)  Goal: Absence of Bleeding  Outcome: Ongoing (see  interventions/notes)     Problem: Cerebral Tissue Perfusion Risk (  Craniotomy/Craniectomy/Cranioplasty)  Goal: Effective Cerebral Tissue Perfusion  Outcome: Ongoing (see interventions/notes)     Problem: Fluid Imbalance (Craniotomy/Craniectomy/Cranioplasty)  Goal: Fluid Balance  Outcome: Ongoing (see interventions/notes)     Problem: Infection (Craniotomy/Craniectomy/Cranioplasty)  Goal: Absence of Infection Signs/Symptoms  Outcome: Ongoing (see interventions/notes)     Problem: Pain (Craniotomy/Craniectomy/Cranioplasty)  Goal: Acceptable Pain Control  Outcome: Ongoing (see interventions/notes)     Problem: Bleeding (Surgery Nonspecified)  Goal: Absence of Bleeding  Outcome: Ongoing (see interventions/notes)     Problem: Bowel Elimination Impaired (Surgery Nonspecified)  Goal: Effective Bowel Elimination  Outcome: Ongoing (see interventions/notes)     Problem: Infection (Surgery Nonspecified)  Goal: Absence of Infection Signs/Symptoms  Outcome: Ongoing (see interventions/notes)     Problem: Pain (Surgery Nonspecified)  Goal: Acceptable Pain Control  Outcome: Ongoing (see interventions/notes)

## 2018-07-13 NOTE — Nurses Notes (Signed)
[facility] notified of patient's HR in the 120s.  Orders to notify if patient's HR sustains greater than 120.  Will continue to monitor.

## 2018-07-13 NOTE — Progress Notes (Signed)
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       Vital Signs:  Temp (24hrs) Max:38.5 C (122.4 F)      Systolic (82NOI), BBC:488 , Min:107 , QBV:694     Diastolic (50TUU), EKC:00, Min:70, Max:113    Temp  Avg: 37.8 C (100.1 F)  Min: 37 C (98.6 F)  Max: 38.5 C (101.3 F)  MAP (Non-Invasive)  Avg: 97.3 mmHG  Min: 24 mmHG  Max: 129 mmHG  Pulse  Avg: 113  Min: 94  Max: 128  Resp  Avg: 20.6  Min: 16  Max: 28  SpO2  Avg: 99.3 %  Min: 94 %  Max: 100 %  Pain Score (Numeric, Faces): 3    Current Medications:  acetaminophen (TYLENOL) 160 mg per 5 mL liquid - grape flavored, 650 mg, Gastric (NG, OG, PEG, GT), Q4H PRN  ascorbic acid (VITAMIN C) tablet, 500 mg, Gastric (NG, OG, PEG, GT), 2x/day  bacitracin 500 units/gram topical ointment tube, , Topical, 2x/day  carvedilol (COREG) tablet, 6.25 mg, Gastric (NG, OG, PEG, GT), 2x/day-Food  cefepime (MAXIPIME) 2 g in NS 100 mL IVPB, 2 g, Intravenous, Q8H  chlorhexidine gluconate (PERIDEX) 0.12% mouthwash, 15 mL, Swish & Spit, 2x/day  enoxaparin PF (LOVENOX) 40 mg/0.4 mL SubQ injection, 40 mg, Subcutaneous, Q24H  famotidine (PEPCID) tablet, 20 mg, Gastric (NG, OG, PEG, GT), 2x/day  fentaNYL (SUBLIMAZE) 50 mcg/mL injection, 25 mcg, Intravenous, Q1H PRN  hydrALAZINE (APRESOLINE) injection 10 mg, 10 mg, Intravenous, Q4H PRN  HYDROcodone-acetaminophen (NORCO) 5-325 mg per tablet, 1 Tab, Gastric (NG, OG, PEG, GT), Q4H PRN    Or  HYDROcodone-acetaminophen (NORCO) 5-325 mg per tablet, 2 Tab, Gastric (NG, OG, PEG, GT), Q4H PRN  labetalol (TRANDATE) 5 mg/mL injection, 10 mg, Intravenous, Q1H PRN  lanolin-oxyquin-pet, hydrophil (BAG BALM) topical ointment, , Apply  Topically, 2x/day PRN  levETIRAcetam (KEPPRA) tablet 750 mg, 750 mg, Gastric (NG, OG, PEG, GT), 2x/day  lisinopril (PRINIVIL) tablet, 40 mg, Gastric (NG, OG, PEG, GT), Daily  morphine 2 mg/mL injection, 2 mg, Intravenous, Q4H PRN  niCARdipine (CARDENE) 50 mg in NS 250 mL (tot vol) infusion, 5 mg/hr, Intravenous, Continuous  nicotine (NICODERM CQ) transdermal patch (mg/24 hr), 14 mg, Transdermal, Daily  NS flush syringe, 2 mL, Intracatheter, Q8HRS    And  NS flush syringe, 2-6 mL, Intracatheter, Q1 MIN PRN  NS flush syringe, 10-30 mL, Intracatheter, Q8HRS  NS flush syringe, 20-30 mL, Intracatheter, Q1 MIN PRN  nutrition protein supplement 15 g per 30 mL packet, 1 Packet, Gastric (NG, OG, PEG, GT), 2x/day  nystatin (MYCOSTATIN) 100,000 units per mL oral liquid, 5 mL, Swish & Spit, 4x/day  nystatin (NYSTOP) 100,000 units/g topical powder, , Apply Topically, 2x/day  ondansetron (ZOFRAN) 2 mg/mL injection, 4 mg, Intravenous, Q6H PRN  perflutren lipid microspheres (DEFINITY) 1.3 mL in NS 10 mL (tot vol) injection, 2 mL, Intravenous, Give in Cardiology  polyethylene glycol (MIRALAX) oral packet, 17 g, Gastric (NG, OG, PEG, GT), Daily  potassium bicarbonate-citric acid (EFFER-K) effervescent tablet, 40 mEq, Gastric (NG, OG, PEG, GT), Daily with Breakfast  senna concentrate (SENNA) 553m per 13moral liquid, 10 mL, Gastric (  NG, OG, PEG, GT), 2x/day  SSIP insulin lispro (HUMALOG) 100 units/mL injection, 0-12 Units, Subcutaneous, Q4H PRN      Today's Physical Exam:   General: Appears very ill   Neurologic: E1 M5 V1T: does not open eyes, intubated (not sedated).  PERRL, no facial weakness, + cough + corneals.  Motor: LUE localizes, RUE and BLE withdraw. UTA sensation  Cardiovascular:    Heart regular rate and rhythm  Lungs: diminished bilaterally  Abdomen: soft, non-tender and bowel sounds normal  Extremities: no cyanosis or edema  Skin: Skin warm and dry    Labs:  I have reviewed all lab results.    I/O:  I/O last 24 hours:        Intake/Output Summary (Last 24 hours) at 07/13/2018 0629  Last data filed at 07/13/2018 0600  Gross per 24 hour   Intake 1233 ml   Output 1610 ml   Net -377 ml     I/O current shift:  12/20 1900 - 12/21 0659  In: 175 [OG:874]  Out: 1610 [Urine:1610]    Drips:   Current Facility-Administered Medications   Medication Dose Frequency Last Rate   . niCARdipine (CARDENE) 50 mg in NS 250 mL (tot vol) infusion  5 mg/hr Continuous           Radiology Results:  MRI brain w/wo contrast 07/06/18: Pending final read    Other Testing:   See below    Microbiology:   See below       Impression and Plan:    Active Hospital Problems    Diagnosis   . 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 Tobacco use disorder   . Hypokalemia     57 years old female with progressive R sided weakness s/p left meningioma resection s/p Craniectomy for evacuation of tumor bed hematoma .     Neurologic:  L Tentorial meningioma s/p craniotomy for tumor resection, s/p evacuation of post-op hemorrhage  -- Q1H neuro checks as patient is at risk for acute neurological decline  -- MRI brain w/wo contrast 07/06/18: Post-op changes following resection, concern for infarct left midbrain, will f/u final read  -- patient is on VEEG follow up on the report   - continue Keppra 750 mg BID   -- STAT CT brain without contrast for any acute change in neurological status     Pain/sedation  -- Tylenol PRN      Cardiovascular:    HTN, chronic  -- SBP goal < 160  -- Hydralazine/labetalol (x2 overnight) PRN  -- Current antihypertensives:  -- Coreg 6.25 BID  -- lisinopril 19m daily    Respiratory:    Acute respiratory failure requiring mechanical ventilation  Ventilator Settings:  Set PEEP: 5 cmH2O  Pressure Support: 8 cmH2O  FiO2: 30 %  -- CXR on 07/06/18: No acute process  -- WP: NIF -60, RSBI 67, + leak, not following for FVC  -- currently requiring frequent suctioning, WP in am 12/22    Renal:  - Daily  BMP      Infectious Disease:   -- Cultures:    -- Blood, urine, MRSA, and BAL negative    -- Continue cefepime 2 g q8 hours EOT 12/22      Hematology:Normocytic anemia   - recheck H&H this afternoon   -continue to monitor and transfuse if hb<7      DVT prophylaxis  - lovenox       Gastrointestinal:  At risk for constipation  -continue Osmolite 1.5 Q4   - last BM 12/21  -- Miralax/senna scheduled    Endocrine:  No acute issues  -- SSIP conservative      Psychiatric:  No acute issues  -- Monitor for ICU delirium       Prophylaxis:    DVT/PE Prophylaxis: SCDs/ Venodynes/Impulse boots  GI: Famotidine      Family:  None present at bedside  Disposition/ Baseline:  ICU  Advance Directives:  None-Discussed  Hospice involvement prior to admission?  no        Roel Cluck, PA-C 07/13/2018   NeuroCritical Care Unit      I saw and examined the patient on 07/13/18.  I reviewed the resident's note.  I agree with the findings including assessment and plan as documented in the resident's note.  Any exceptions/additions are noted .    The following organ systems are impaired causing an immanent threat to his life or potentially causing his condition to deteriorate.  The treatment of these conditions required highly complex decision making to manipulate and support his organ damage.     Neurological-s/p left tentorial meningioma resection with post op hemorrhage and craniectomy.  Neurologically briskly purposeful but not follow ing commands.  Possibly aphasic.    Cardiovascular-goal SBP< 160, PRN x2 o/n.   Pulmonological-acute respiratory failure requiring mechanical ventilation. Weaning paramaters improving but still with copious secretions.  Will likely need tracheostomy.   Gastro-intestinal-dysphagia, Nutrition supported while patient with dysphagia and remains intubated/unable to swallow to prevent malnutrition.  Genitourinary-foley placed for retention.   Endocrine-ISS   Hematologic-acute blood loss anemia, will  follow.  Transfuse for HB<7  Infectious disease- cefepime finishing 12/22    The patients disposition is ICU     Critical Care Time: 30  minutes    Merilyn Baba, MD

## 2018-07-13 NOTE — Nurses Notes (Signed)
Restraint Continuation      Patient continues to have the following condition AMS.       Least restrictive alternatives attempted : Considered 1:1 sit, Increase patient observation, moved closer to the nurses station, bed-exit alarm/motion detector, call light accessible, concealed tubes/lines and decreased/removed stimulus    Patient continues to exhibit the following behaviors: uncooperative    The restraint continued to facilitate medical/surgical treatment to ensure safety.    The patient will continue to be evaluated and assessments documented on the flowsheet to ensure that the patient is released from the restraint at the earliest possible time

## 2018-07-14 LAB — BASIC METABOLIC PANEL
ANION GAP: 10 mmol/L (ref 4–13)
BUN/CREA RATIO: 33 — ABNORMAL HIGH (ref 6–22)
BUN: 18 mg/dL (ref 8–25)
CALCIUM: 8.8 mg/dL (ref 8.5–10.2)
CHLORIDE: 105 mmol/L (ref 96–111)
CO2 TOTAL: 24 mmol/L (ref 22–32)
CREATININE: 0.55 mg/dL (ref 0.49–1.10)
ESTIMATED GFR: >60 mL/min/1.73mˆ2 (ref >60)
GLUCOSE: 107 mg/dL (ref 65–139)
POTASSIUM: 3.4 mmol/L — ABNORMAL LOW (ref 3.5–5.1)
SODIUM: 139 mmol/L (ref 136–145)

## 2018-07-14 LAB — POC BLOOD GLUCOSE (RESULTS)
GLUCOSE, POC: 128 mg/dL — ABNORMAL HIGH (ref 70–105)
GLUCOSE, POC: 113 mg/dL — ABNORMAL HIGH (ref 70–105)
GLUCOSE, POC: 111 mg/dL — ABNORMAL HIGH (ref 70–105)
GLUCOSE, POC: 133 mg/dL — ABNORMAL HIGH (ref 70–105)
GLUCOSE, POC: 111 mg/dL — ABNORMAL HIGH (ref 70–105)
GLUCOSE, POC: 120 mg/dL — ABNORMAL HIGH (ref 70–105)

## 2018-07-14 LAB — CBC
HCT: 23.4 % — ABNORMAL LOW (ref 34.8–46.0)
HGB: 7.7 g/dL — ABNORMAL LOW (ref 11.5–16.0)
MCH: 29.8 pg (ref 26.0–32.0)
MCHC: 32.9 g/dL (ref 31.0–35.5)
MCV: 90.7 fL (ref 78.0–100.0)
MPV: 10.5 fL (ref 8.7–12.5)
PLATELETS: 383 x10ˆ3/uL (ref 150–400)
RBC: 2.58 10*6/uL — ABNORMAL LOW (ref 3.85–5.22)
RDW-CV: 14.8 % (ref 11.5–15.5)
WBC: 11 x10ˆ3/uL (ref 3.7–11.0)

## 2018-07-14 LAB — MAGNESIUM: MAGNESIUM: 2 mg/dL (ref 1.6–2.6)

## 2018-07-14 LAB — VENOUS BLOOD GAS
%FIO2 (VENOUS): 30 %
BASE EXCESS: 3.2 mmol/L — ABNORMAL HIGH (ref ?–3.0)
BICARBONATE (VENOUS): 26.9 mmol/L — ABNORMAL HIGH (ref 22.0–26.0)
PCO2 (VENOUS): 37 mmHg — ABNORMAL LOW (ref 41.00–51.00)
PH (VENOUS): 7.47 — ABNORMAL HIGH (ref 7.31–7.41)
PO2 (VENOUS): 38 mmHg (ref 35.0–50.0)

## 2018-07-14 LAB — PHOSPHORUS: PHOSPHORUS: 3.3 mg/dL (ref 2.4–4.7)

## 2018-07-14 MED ORDER — POTASSIUM BICARBONATE-CITRIC ACID 20 MEQ EFFERVESCENT TABLET
40.0000 meq | EFFERVESCENT_TABLET | Freq: Once | ORAL | Status: AC
Start: 2018-07-14 — End: 2018-07-14
  Administered 2018-07-14: 40 meq via GASTROSTOMY
  Filled 2018-07-14: qty 2

## 2018-07-14 MED ORDER — CARVEDILOL 12.5 MG TABLET
12.50 mg | ORAL_TABLET | Freq: Two times a day (BID) | ORAL | Status: DC
Start: 2018-07-14 — End: 2018-07-21
  Administered 2018-07-14 – 2018-07-20 (×14): 12.5 mg via GASTROSTOMY
  Administered 2018-07-21 (×2): 0 mg via GASTROSTOMY
  Filled 2018-07-14 (×16): qty 1

## 2018-07-14 MED ORDER — ELECTROLYTE-A INTRAVENOUS SOLUTION BOLUS
1000.0000 mL | Freq: Once | INTRAVENOUS | Status: AC
Start: 2018-07-15 — End: 2018-07-14
  Administered 2018-07-14: 1000 mL via INTRAVENOUS
  Administered 2018-07-14: 0 mL via INTRAVENOUS

## 2018-07-14 MED ORDER — POLYETHYLENE GLYCOL 3350 17 GRAM ORAL POWDER PACKET
17.00 g | Freq: Every day | ORAL | Status: DC | PRN
Start: 2018-07-14 — End: 2018-07-16

## 2018-07-14 MED ORDER — CARVEDILOL 12.5 MG TABLET
12.50 mg | ORAL_TABLET | Freq: Two times a day (BID) | ORAL | Status: DC
Start: 2018-07-14 — End: 2018-07-14

## 2018-07-14 MED ADMIN — nystatin 100,000 unit/gram topical powder: GASTROSTOMY | @ 21:00:00 | NDC 00574200815

## 2018-07-14 MED ADMIN — sodium chloride 0.9 % (flush) injection syringe: @ 22:00:00

## 2018-07-14 MED ADMIN — potassium bicarbonate-citric acid 20 mEq effervescent tablet: GASTROSTOMY | @ 13:00:00

## 2018-07-14 MED ADMIN — VANCOMYCIN CADD PUMP INTERMITTENT INFUSION - PHARMACY ORDER: GASTROSTOMY | @ 21:00:00

## 2018-07-14 MED ADMIN — sodium chloride 0.9 % (flush) injection syringe: ORAL | @ 14:00:00

## 2018-07-14 MED ADMIN — cholestyramine-aspartame 4 gram oral powder for susp in a packet: GASTROSTOMY | @ 09:00:00

## 2018-07-14 MED ADMIN — sodium chloride 0.9 % (flush) injection syringe: ORAL | @ 09:00:00

## 2018-07-14 MED ADMIN — lactated Ringers intravenous solution: GASTROSTOMY | @ 09:00:00

## 2018-07-14 NOTE — Nurses Notes (Signed)
Restraint Continuation      Patient continues to have the following condition AMS.       Least restrictive alternatives attempted : decreased/removed stimulus, frequent orientation, reoriented to person, place, time, and treatment, redirected behavior and assessed meds/medical problems    Patient continues to exhibit the following behaviors: uncooperative    The restraint continued to facilitate medical/surgical treatment to ensure safety.    The patient will continue to be evaluated and assessments documented on the flowsheet to ensure that the patient is released from the restraint at the earliest possible time

## 2018-07-14 NOTE — Consults (Signed)
[facility]  Trauma Consult      [place], [place], 57 y.o. female  Date of Birth:  03/13/1961  Encounter Start Date:  07/05/2018  Inpatient Admission Date:  07/05/2018    Primary Attending: Dr. 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 Pt was remained intubated since that time. Current ventilator settings listed below. She is currently tolerating bolus tube feeds q4 hr (1 can).     Ventilator Settings:  Set PEEP: 5 cmH2O  Pressure Support: 8 cmH2O  FiO2: 30 %      ROS:  MUST comment on all "Abnormal" findings   ROS Other than ROS in the HPI, all other systems were negative.      PAST MEDICAL/ FAMILY/ SOCIAL HISTORY:       Past Medical History:   Diagnosis Date   . Anxiety    . Arthropathy    . Cancer (CMS HCC)     brain tumor   . Dyspnea on exertion    . Esophageal reflux     does not take meds   . Heart murmur     benign   . HTN (hypertension)    . Hyperlipemia    . Muscle weakness     right sided weakness   . Palpitations    . Shortness of breath    . Wears glasses     reading           Medications Prior to Admission     Prescriptions    acetaminophen (TYLENOL) 325 mg Oral Tablet    Take 2 Tabs (650 mg total) by mouth Every 6 hours as needed for Pain    diphenoxylate-atropine (LOMOTIL) 2.5-0.025 mg Oral Tablet    Once per day as needed    metoprolol tartrate (LOPRESSOR) 25 mg Oral Tablet    Take 25 mg by mouth Twice daily    ondansetron (ZOFRAN) 8 mg Oral Tablet    Take 8 mg by mouth Twice per day as needed    quinapril (ACCUPRIL) 40 mg Oral Tablet    Take 40 mg by mouth Once a day         No Known Allergies  Past Surgical History:   Procedure Laterality Date   . HX APPENDECTOMY     . HX BREAST AUGMENTATION Bilateral    . HX LAP  CHOLECYSTECTOMY     . HX LUMBAR DISKECTOMY  1999   . HX OTHER Right     benign tumor removed from upper right leg x3   . HX OTHER      exploratory lap   . HX TUBAL LIGATION           Social History     Tobacco Use   . Smoking status: Current Every Day Smoker     Packs/day: 1.00     Years: 30.00     Pack years: 30.00     Types: Cigarettes   . Smokeless tobacco: Never Used   . Tobacco comment: down to 8 cigarettes, counseled on 1-800-QUIT_NOW   Substance Use Topics   . Alcohol use: Never     Frequency: Never   . Drug use: Never       Code Status:  Code Status Information  Code Status    Full Code        Family History:   Family Medical History:     Problem Relation (Age of Onset)    Ehlers-Danlos syndrome Daughter    Heart Attack Mother    Lymphoma Mother          PHYSICAL EXAMINATION: MUST comment on all "Abnormal" findings    INITIAL VITALS: Temperature: 36.1 C (97 F)  BP (Non-Invasive): (!) 166/101(repeated 2x  reported to nurse)  MAP (Non-Invasive): 108 mmHG  Heart Rate: 63  Respiratory Rate: 14  Pain Score (Numeric, Faces): 5  SpO2: 96 %  EXAM: Temperature: (!) 38.1 C (100.6 F)  Heart Rate: (!) 107  BP (Non-Invasive): 122/69  Respiratory Rate: 20  SpO2: 100 %  Pain Score (Numeric, Faces): 3  GEN:   NAD  HEENT:   atraumatic pupils equal, round and reactive to light; ,  extraocular movements are intact., Conjunctivae pink, nasal mucosa normal, mucous membranes moist. and No malocclusion.   Neck: No masses or abnormalities noted on exam. No scars identified.  PULM:   Lung sounds clear to auscultation bilaterally with equal chest expansion  CV:   Regular rate and rhythm  CHEST::No abrasions or contusions  ABD:   Abd soft, nondistended. RLQ healed surgical scar, midline exploratory laparotomy healed scar  MS: Atraumatic.     NEURO:  Nonresponsive.   GLASGOW COMA SCALE: Eye opening: 1 no response, Verbal resonse:  T Intubated, Best motor response:  5 moved to localized pain  VASCULAR:  All pulses palpable and  equal bilaterally  INTEGUMENTARY:  Pink, warm, and dry    DIAGNOSTIC STUDIES: Comment on "Positives"   Labs:  Lab Results Today:    Results for orders placed or performed during the hospital encounter of 07/05/18 (from the past 24 hour(s))   BASIC METABOLIC PANEL   Result Value Ref Range    SODIUM 139 136 - 145 mmol/L    POTASSIUM 3.4 (L) 3.5 - 5.1 mmol/L    CHLORIDE 105 96 - 111 mmol/L    CO2 TOTAL 24 22 - 32 mmol/L    ANION GAP 10 4 - 13 mmol/L    CALCIUM 8.8 8.5 - 10.2 mg/dL    GLUCOSE 107 65 - 139 mg/dL    BUN 18 8 - 25 mg/dL    CREATININE 0.55 0.49 - 1.10 mg/dL    BUN/CREA RATIO 33 (H) 6 - 22    ESTIMATED GFR >60 >60 mL/min/1.15m^2   CBC   Result Value Ref Range    WBC 11.0 3.7 - 11.0 x10^3/uL    RBC 2.58 (L) 3.85 - 5.22 x10^6/uL    HGB 7.7 (L) 11.5 - 16.0 g/dL    HCT 23.4 (L) 34.8 - 46.0 %    MCV 90.7 78.0 - 100.0 fL    MCH 29.8 26.0 - 32.0 pg    MCHC 32.9 31.0 - 35.5 g/dL    RDW-CV 14.8 11.5 - 15.5 %    PLATELETS 383 150 - 400 x10^3/uL    MPV 10.5 8.7 - 12.5 fL   MAGNESIUM   Result Value Ref Range    MAGNESIUM 2.0 1.6 - 2.6 mg/dL   PHOSPHORUS   Result Value Ref Range    PHOSPHORUS 3.3 2.4 - 4.7 mg/dL   POC BLOOD GLUCOSE (RESULTS)   Result Value Ref Range    GLUCOSE, POC 128 (H) 70 - 105 mg/dl   VENOUS BLOOD GAS   Result Value Ref Range    %  FIO2 (VENOUS) 30.0 %    BICARBONATE (VENOUS) 26.9 (H) 22.0 - 26.0 mmol/L    PCO2 (VENOUS) 37.00 (L) 41.00 - 51.00 mm/Hg    PH (VENOUS) 7.47 (H) 7.31 - 7.41    PO2 (VENOUS) 38.0 35.0 - 50.0 mm/Hg    BASE EXCESS 3.2 (H) -3.0 - 3.0 mmol/L   POC BLOOD GLUCOSE (RESULTS)   Result Value Ref Range    GLUCOSE, POC 113 (H) 70 - 105 mg/dl   POC BLOOD GLUCOSE (RESULTS)   Result Value Ref Range    GLUCOSE, POC 111 (H) 70 - 105 mg/dl   POC BLOOD GLUCOSE (RESULTS)   Result Value Ref Range    GLUCOSE, POC 133 (H) 70 - 105 mg/dl   POC BLOOD GLUCOSE (RESULTS)   Result Value Ref Range    GLUCOSE, POC 111 (H) 70 - 105 mg/dl   POC BLOOD GLUCOSE (RESULTS)   Result Value Ref Range    GLUCOSE, POC  120 (H) 70 - 105 mg/dl       Radiology:    OSH Films with reads-  None    Internal Studies-  CT CHEST ABDOMEN PELVIS W IV CONTRAST performed on 06/08/2018 7:55 PM    INDICATION: 57 years old Female  assess for malignancy    TECHNIQUE: Axial images from the thoracic inlet through the pelvis obtained  after administration of IV contrast with reformatted coronal and sagittal  images, utilizing soft tissue and lung algorithms    CONTRAST: 100 cc of Optiray 350    RADIATION DOSE: 842.50 mGycm    COMPARISON:    FINDINGS:  CHEST:  Lungs/Pleura: The lungs are clear without focal consolidation. There is a  small perifissural nodule in the anterior portion of the right lower lobe.   The central airways are patent. No pleural effusion or pneumothorax is  present.    Heart/Mediastinum: The thyroid is unremarkable. There is a small hiatal  hernia. There are no suspicious mediastinal or hilar lymph nodes. The heart  is at the upper limits of normal in size. There is no pericardial effusion.  The aorta and pulmonary trunk are normal in size. There is no evidence of  pulmonary embolus.    ABDOMEN:  Liver: Normal in size and attenuation.    Gallbladder/Biliary System: The gallbladder surgically absent. There is  post surgical common bile duct dilatation.    Spleen: Normal in size and attenuation.    Pancreas: Homogenous in attenuation with no pancreatic ductal dilation.    Adrenals: Grossly unremarkable bilaterally.    Kidney/Ureters/Bladder: Multiple low-density cysts are noted the left  kidney that are too small to characterize. There is no evidence of  hydronephrosis. The bladder is distended without wall thickening or  pericystic fluid.    Reproductive Organs: The uterus is present. The ovaries are incompletely  visualized.    Bowel: Examination of the bowel is limited due to non-opacification;  however, there is no evidence of obstruction and no specific areas of bowel  wall thickening are seen. The appendix is  not well visualized; however, no  secondary signs of appendicitis are seen.    Vasculature: The abdominal aorta is of normal caliber. Celiac axis, SMA,  bilateral renal arteries, IMA, iliofemoral arteries, and the portal and  hepatic veins are patent. IVC is of normal caliber. Mild atherosclerotic  changes are seen throughout the abdominal vasculature.    Peritoneal Cavity/Lymph Nodes: No free fluid, free air, or lymphadenopathy  by size criteria.    Soft  Tissues/Bones: Bilateral breast implants are noted. There are  degenerative changes of the cervical spine. The soft tissues are  unremarkable.    IMPRESSION:  1.  Small perifissural nodule in the anterior portion of the right lower  lobe.  2.  Small hiatal hernia.  3.  No evidence of metastases.  XR ABD X-RAY CHECK DOBHOFF PLACEMENT performed on 07/07/2018 8:05 AM.    REASON FOR EXAM:  check OG placement    TECHNIQUE: 1 views/1 images submitted for interpretation.    COMPARISON:  07/06/2018    FINDINGS:  Minimal interval advancement and/or replacement of enteric tube  with proximal side-port still above the level the GE junction. Esophageal  probe with tip just below the level the GE junction. Endotracheal tube with  tip overlying mid thoracic airway. Minimal bibasilar atelectasis.    IMPRESSION:  Minimal interval advancement and/or replacement of enteric tube with  proximal side-port still above the level the GE junction. Continue to  recommend advancement if used for enteric feeds if not already performed      Assessment & Plan/Recommendations:      Active Hospital Problems   (*Primary Problem)    Diagnosis   . *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   . Tobacco use disorder   . Hypokalemia       Plan:    -Pt is surgical candidate for trach/PEG.   -Will need to discuss with MPOA re consent for operation.   -Will need to discuss with staff timing of operation. Please keep NPO after midnight. Will update  primary if tube feeds may be continued in AM if operation will not occur tomorrow    Cherly Beach, MD     Trauma/Surgical Critical Care/Acute Care Surgery Staff    I have seen and examined the patient. I have reviewed the resident  note.  I agree with the findings and plan of care as documented on 07/15/2018 including history, review of systems, examination, and findings with exceptions/additions as noted/edited.    Will plan for trach and peg this upcoming week.      Electronically Signed by:    Tawana Scale MD, MS  Assistant Professor of Surgery  Trauma, Surgical Critical Care, and [place]  07/15/2018 00:51

## 2018-07-14 NOTE — Respiratory Therapy (Signed)
Pt currently on PSV PS 8 Peep 5 FiO2 30%. Pt suctions for large amounts of thick clear oral sections and yellow/creamy secretions in ETT. Will continue to monitor.

## 2018-07-14 NOTE — Care Plan (Signed)
[place]  Physical Therapy Progress Note      Patient Name: [name]  Date of Birth: 12/06/1960  Height:  167.6 cm (5' 5.98")  Weight:  90.8 kg (200 lb 2.8 oz)  Room/Bed: 11/A  Payor: HEALTH PLAN / Plan: HEALTH PLAN (PPO-NON ST EMP) / Product Type: PPO /     Assessment:     (P) Pt tolerated treatment well this date. Able to tolerate PROM to all 4 extremeites. Some active movement to RLE but was not purpousful. At this time, Pt will cont to likely require rehab placement once medically stable. Will cont to follow and progress as appropriate.    Discharge Needs:   Equipment Recommendation: (P) TBD  Discharge Disposition: (P) TBD, inpatient rehabilitation facility, skilled nursing facility  JUSTIFICATION OF DISCHARGE RECOMMENDATION   Based on current diagnosis, functional performance prior to admission, and current functional performance, this patient requires continued PT services in (P) TBD, inpatient rehabilitation facility, skilled nursing facility in order to achieve significant functional improvements in these deficit areas: aerobic capacity/endurance, arousal, attention, and cognition, gait, locomotion, and balance, neuromuscular, ventilation and respiration/gas exchange. The above recommendation is based upon the current examination and evaluation performed on this date. As subsequent sessions are completed, recommendations will be updated accordingly.      Plan:   Continue to follow patient according to established plan of care.  The risks/benefits of therapy have been discussed with the patient/caregiver and he/she is in agreement with the established plan of care.     Subjective & Objective:        07/14/18 0759   Therapist Pager   PT Assigned/ Pager # [phone]   Rehab Session   Document Type therapy progress note (daily note)   Total PT Minutes: 10   Patient Effort poor   Symptoms Noted During/After Treatment none   General Information   Patient Profile  Reviewed? yes   Medical Lines Arterial Line;Foley Catheter;PIV Line;Restraints;Telemetry   Respiratory Status ventilator   Existing Precautions/Restrictions aspiration precautions;fall precautions;full code   Mutuality/Individual Preferences   Individualized Care Needs oob with lift to chair when appropriate.    Pre Treatment Status   Pre Treatment Patient Status Patient supine in bed;Nurse approved session;Venodynes in place and activated;Restraints in place (specify in comments)   Support Present Pre Treatment  None   Communication Pre Treatment  Nurse   Vital Signs   Vitals Comment vss   Pain Assessment   Pre/Post Treatment Pain Comment had no s/s pain or distress.    Therapeutic Exercise/Activity   Comment PROM to all 4 extremeties x 15 reps in all planes/joints. active movement to R LE but not purpousful. Did not follow commands t/o.    Post Treatment Status   Post Treatment Patient Status Patient supine in bed;Restraints in place (specify in comments);Venodynes in place and activated   Support Present Post Treatment  None   Communication Post Treatement Nurse   Plan of Care Review   Plan Of Care Reviewed With patient   Physical Therapy Clinical Impression   Assessment Pt tolerated treatment well this date. Able to tolerate PROM to all 4 extremeites. Some active movement to RLE but was not purpousful. At this time, Pt will cont to likely require rehab placement once medically stable. Will cont to follow and progress as appropriate.   Anticipated Equipment Needs at Discharge (PT) TBD   Anticipated Discharge Disposition TBD;inpatient rehabilitation facility;skilled nursing facility  Therapist:   Doy Hutching, PTA   Pager #: [phone]

## 2018-07-14 NOTE — Care Plan (Signed)
Pt on vent settings of +5/8 PS/30%. SBT performed (see previous note). Cuff leak not present, service aware. Will continue to monitor

## 2018-07-14 NOTE — Progress Notes (Signed)
[facility]  NEUROSURGERY   PROGRESS NOTE    [name], 57 y.o. female  Date of Admission:  07/05/2018  Date of Service: 07/14/2018  Date of Birth:  [birth year]     Chief Complaint: right upper and lower extremity weakness    Subjective:   No acute events overnight. Cardene drip weaned off, Foley catheter inserted. Neurologic exam remains stable. Continues to have copious secretions.    Vital Signs:  Temp (24hrs) Max:38.2 C (169.6 F)      Systolic (78LFY), BOF:751 , Min:119 , WCH:852     Diastolic (77OEU), MPN:36, Min:69, Max:104    Temp  Avg: 37.8 C (100 F)  Min: 37.2 C (99 F)  Max: 38.2 C (100.8 F)  MAP (Non-Invasive)  Avg: 96.2 mmHG  Min: 85 mmHG  Max: 116 mmHG  Pulse  Avg: 104.6  Min: 85  Max: 121  Resp  Avg: 21  Min: 17  Max: 28  SpO2  Avg: 99.4 %  Min: 96 %  Max: 100 %  Pain Score (Numeric, Faces): 3    Min/Max/Avg ICP/CPP last 24hrs:   No data recorded    Today's Physical Exam:    Appears acutely ill, intubated  GCS 1 5 1  T  -- Eyes do not open to noxious stimuli  -- Localizes in LUE, Withdraws in RUE and BLE  PERRL  Face symmetric  +cough    Unable to assess hearing  Unable to assess speech  Unable to assess fund of knowledge  Unable to assess attention span & concentration  Unable to assess recent and remote memory  CN 3 4 6  EOMI Unable to assess  CN 11 shrug symmetric Unable to assess  Muscle Strength Unable to assess  Unable to assess drift    -- L cranial staples c/d/i  -- JP drain Rapide  -- LD Rapide    Assessment/Plan:  [name] is a 57 yo F PMH HTN, HLD w/ 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 POD9/9.  -- FU secretions and ability to extubate   -- If unable, will consult trauma for Trach/PEG  -- Keppra 750 mg BID (no end date)   -- Will need outpatient neurology  follow-up  -- Please bathe patient daily and clean wound daily  -- Okay for soap/shampoo  -- Apply bacitracin ointment BID, cleaning old ointment off before application of new ointment  -- Final pathology: WHO grade I meningioma    -- Imaging:    -- TTE (07/12/18): EF 75%, no regional wall abnormalities   -- CT brain w/o (07/11/18): stable    -- CT brain w/o (07/09/18 @ 1328):stable   -- CT brain w/o (07/09/18 @ 0430):stable   -- CT brain w/o (07/08/18):postop changes from decompressive craniectomy with interval mild extracalvarial brain herniation through the craniectomy site; mild increase in cerebral edema at the tumor resection bed and adjacent parenchyma without significant increase in hemorrhage   -- MRI brain w/wo (07/06/18):gross total resection of a left medial tentorial meningioma; hemorrhagic productswithin the resection cavity extending into the parenchyma of the left thalamus and tracking down within the left midbrain and extending to the fourth ventricle   -- CT brain w/o (07/06/18): postop changes from left decompressive craniectomy for evacuation of hematoma of tumor bed with residual hemorrhage and improved edema and rightward midline shift  --  CT brain w/o (07/06/18): postop changes from decompressive craniectomy for evacuation of tumor bed hematoma with minimal residual blood at the surgical site   -- CT brain w/o (07/05/18): postop changes with development of hematoma at tumor bed and 8 mm of midline shift  -- Pain/spasm control: Tylenol PRN, Fentanyl PRN, Norco PRN  -- Diet: NPO + Osmolite TF + FWF 200 cc q6h  -- Bowel regimen, last BM 07/13/18  -- Abx: none  -- Activity: up in chair with assist   -- Craniectomy precautions, helmet to be worn at all times when OOB  -- PT/OT: AR vs SNF  -- DVT ppx: Lovenox, SCDs/Venodynes  -- Consults:    NCCU/Neurology   -- Keppra 750 mg BID   -- Will need outpatient neurology follow-up for AED management  --  Lines/Drains: Midline, OG-tube, Foley catheter, ET-tube  -- Wound: L fronto-temporal staples, JP drain Rapide, LD Rapide, helmet  -- Disposition: ongoing    [name], M.D.  PGY-4 Neurosurgery  07/14/2018, 01:24         I saw and examined the patient.  I reviewed the resident's note.  I agree with the findings and plan of care as documented in the resident's note.  Any exceptions/additions are edited/noted.    [name] Baba, MD

## 2018-07-14 NOTE — Care Plan (Signed)
Patient s/p tumor resection.  GCS 1/5/1.  SBP maintained <160.  PRN medication administered for fever.  Tube feeds continued with Q6 FWF.  LUE restraint maintained to prevent patient from pulling at mechanical ventilation.  Skin precautions maintained with turn Q2 and preventive mepilex.  Safety precautions maintained with hourly rounding.  Plan for trach/PEG.    Patient Goals Based on Modifiable Risk Factors    The following modifiable risk factors were discussed with patient:     Hypertension    Goal:   Maintain blood pressure less than 140/90 (or less than 130/90 for patients with diabetes or chronic kidney disease).    Patient's selected lifestyle modification(s):  Routinely take antihypertensive medications as prescribed    Patient/Family/Caregiver response to plan:    Patient intubated and shows no evidence of learning.  Will continue to monitor for educational opportunities.

## 2018-07-14 NOTE — Progress Notes (Signed)
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 Nicardipine gtt weaned off overnight. Continues to have copious secretions, no cuff leak on weaning parameters this AM.        Vital Signs:  Temp (24hrs) Max:38.2 C (219.7 F)      Systolic (58ITG), PQD:826 , Min:119 , EBR:830     Diastolic (94MHW), KGS:81, Min:69, Max:89    Temp  Avg: 37.8 C (100 F)  Min: 37.2 C (99 F)  Max: 38.2 C (100.8 F)  MAP (Non-Invasive)  Avg: 95.1 mmHG  Min: 86 mmHG  Max: 103 mmHG  Pulse  Avg: 104.6  Min: 85  Max: 120  Resp  Avg: 21  Min: 17  Max: 24  SpO2  Avg: 99 %  Min: 95 %  Max: 100 %  Pain Score (Numeric, Faces): 3    Current Medications:  acetaminophen (TYLENOL) 160 mg per 5 mL liquid - grape flavored, 650 mg, Gastric (NG, OG, PEG, GT), Q4H PRN  ascorbic acid (VITAMIN C) tablet, 500 mg, Gastric (NG, OG, PEG, GT), 2x/day  bacitracin 500 units/gram topical ointment tube, , Topical, 2x/day  carvedilol (COREG) tablet, 6.25 mg, Gastric (NG, OG, PEG, GT), 2x/day-Food  cefepime (MAXIPIME) 2 g in NS 100 mL IVPB, 2 g, Intravenous, Q8H  chlorhexidine gluconate (PERIDEX) 0.12% mouthwash, 15 mL, Swish & Spit, 2x/day  enoxaparin PF (LOVENOX) 40 mg/0.4 mL SubQ injection, 40 mg, Subcutaneous, Q24H  famotidine (PEPCID) tablet, 20 mg, Gastric (NG, OG, PEG, GT), 2x/day  fentaNYL (SUBLIMAZE) 50 mcg/mL injection, 25 mcg, Intravenous, Q1H PRN  hydrALAZINE (APRESOLINE) injection 10 mg, 10 mg, Intravenous, Q4H PRN  HYDROcodone-acetaminophen (NORCO) 5-325 mg per tablet, 1 Tab, Gastric (NG, OG, PEG, GT), Q4H PRN    Or  HYDROcodone-acetaminophen (NORCO) 5-325 mg per tablet, 2 Tab, Gastric (NG, OG, PEG, GT), Q4H PRN  labetalol (TRANDATE) 5  mg/mL injection, 10 mg, Intravenous, Q1H PRN  lanolin-oxyquin-pet, hydrophil (BAG BALM) topical ointment, , Apply Topically, 2x/day PRN  levETIRAcetam (KEPPRA) tablet 750 mg, 750 mg, Gastric (NG, OG, PEG, GT), 2x/day  lisinopril (PRINIVIL) tablet, 40 mg, Gastric (NG, OG, PEG, GT), Daily  nicotine (NICODERM CQ) transdermal patch (mg/24 hr), 14 mg, Transdermal, Daily  NS flush syringe, 2 mL, Intracatheter, Q8HRS    And  NS flush syringe, 2-6 mL, Intracatheter, Q1 MIN PRN  NS flush syringe, 10-30 mL, Intracatheter, Q8HRS  NS flush syringe, 20-30 mL, Intracatheter, Q1 MIN PRN  nutrition protein supplement 15 g per 30 mL packet, 1 Packet, Gastric (NG, OG, PEG, GT), 2x/day  nystatin (MYCOSTATIN) 100,000 units per mL oral liquid, 5 mL, Swish & Spit, 4x/day  nystatin (NYSTOP) 100,000 units/g topical powder, , Apply Topically, 2x/day  ondansetron (ZOFRAN) 2 mg/mL injection, 4 mg, Intravenous, Q6H PRN  perflutren lipid microspheres (DEFINITY) 1.3 mL in NS 10 mL (tot vol) injection, 2 mL, Intravenous, Give in Cardiology  polyethylene glycol (MIRALAX) oral packet, 17 g, Gastric (NG, OG, PEG, GT), Daily  potassium bicarbonate-citric acid (EFFER-K) effervescent tablet, 40 mEq, Gastric (NG, OG, PEG, GT), Daily with Breakfast  senna concentrate (SENNA) 574m per 120moral liquid, 10 mL, Gastric (NG, OG, PEG, GT), 2x/day  SSIP  insulin lispro (HUMALOG) 100 units/mL injection, 0-12 Units, Subcutaneous, Q4H PRN      Today's Physical Exam:   General: Appears very ill   Neurologic: E1 M5 V1T: does not open eyes, intubated (not sedated).  PERRL, no facial weakness, + cough + corneals.  Motor: LUE localizes, RUE and BLE withdraw. UTA sensation  Cardiovascular:    Heart regular rate and rhythm  Lungs: diminished bilaterally  Abdomen: soft, non-tender and bowel sounds normal  Extremities: no cyanosis or edema  Skin: Skin warm and dry    Labs:  I have reviewed all lab results.    I/O:  I/O last 24 hours:      Intake/Output Summary (Last 24  hours) at 07/14/2018 0630  Last data filed at 07/14/2018 0600  Gross per 24 hour   Intake 2056 ml   Output 1520 ml   Net 536 ml     I/O current shift:  12/21 1900 - 12/22 0659  In: 540 [I.V.:33; DJ:242]  Out: 765 [Urine:765]    Drips:   Current Facility-Administered Medications   Medication Dose Frequency Last Rate         Radiology Results:  MRI brain w/wo contrast 07/06/18: "acute postoperative changes related to gross total resection of a left  medial tentorial meningioma with previously seen mass effect on the left  thalamus, left middle cerebral peduncle and left midbrain. The current  examination demonstrate hemorrhagic products are identified within the  resection cavity extending into the parenchyma of the left thalamus and  tracking down within the left midbrain and extending to the fourth  Ventricle."            Impression and Plan:    Active Hospital Problems    Diagnosis   . 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 Tobacco use disorder   . Hypokalemia     57 year old female with progressive R sided weakness s/p left meningioma resection s/p Craniectomy for evacuation of tumor bed hematoma .       Neurologic:  L Tentorial meningioma s/p craniotomy for tumor resection, s/p evacuation of post-op hemorrhage  -- Q1H neuro checks as patient is at risk for acute neurological decline  -- MRI brain w/wo contrast 07/06/18: see above for read  - continue Keppra 750 mg BID   -- STAT CT brain without contrast for any acute change in neurological status     Pain/sedation  -- Tylenol PRN, none overnight      Cardiovascular:    HTN, chronic  -- SBP goal < 160  -- Hydralazine/labetalol (none overnight) PRN  -- Current antihypertensives:  -- Coreg 12.5 BID (increased 12/22)  -- lisinopril 110m daily      Respiratory:    Acute respiratory failure requiring mechanical ventilation  Ventilator Settings:  Set PEEP: 5 cmH2O  Pressure Support: 8 cmH2O  FiO2: 30  %  -- CXR on 07/06/18: No acute process  -- WP: NIF -60, RSBI 70, NO leak, not following for FVC  -- currently requiring frequent suctioning  -- Will consult trauma for trach/PEG      Renal:  - Daily BMP      Infectious Disease:   -- Cultures:    -- Blood, urine, MRSA, and BAL negative    -- Continue cefepime 2 g q8 hours EOT 12/22      Hematology:Normocytic anemia   - recheck H&H this afternoon   -continue to monitor and transfuse  if hb<7    DVT prophylaxis  - lovenox       Gastrointestinal:    At risk for constipation  -  continue Osmolite 1.5 5 cans/day with FWF   -  last BM 12/22  -- Miralax/senna scheduled      Endocrine:  No acute issues  -- SSIP conservative  -- Glucose 104-128      Psychiatric:  No acute issues  -- Monitor for ICU delirium       Prophylaxis:    DVT/PE Prophylaxis: SCDs/ Venodynes/Impulse boots  GI: Famotidine      Family:  None present at bedside  Disposition/ Baseline:  ICU  Advance Directives:  None-Discussed  Hospice involvement prior to admission?  no        Morrell Riddle, APRN,NP-C  07/14/2018, 06:41        I saw and examined the patient on 07/14/18.  I reviewed the resident's note.  I agree with the findings including assessment and plan as documented in the resident's note.  Any exceptions/additions are noted .    The following organ systems are impaired causing an immanent threat to his life or potentially causing his condition to deteriorate.  The treatment of these conditions required highly complex decision making to manipulate and support his organ damage.     Neurological- left tentorial meningioma s/p resection and post op hemorrhage s/p craniectomy.  Neurologically stable, reliably localizing and intermittently following commands.   Cardiovascular-goal SBP<160. Malignant hypertension, on gtt o/n.  Will add PO agent today.   Pulmonological-acute respiratory failure requiring mechanical ventilation, trach pending, no cuff leak and copious secretion.      Gastro-intestinal-dysphagia, Nutrition supported while patient with dysphagia and remains intubated/unable to swallow to prevent malnutrition. PEG pending.   Genitourinary-foley in place for perineal excoriations.   Endocrine-ISS   Hematologic-acute blood loss anemia.  Follow   Infectious disease- presumed PNA, cefepime stopping today     The patients disposition is ICU     Critical Care Time: 30  minutes    Merilyn Baba, MD

## 2018-07-14 NOTE — Nurses Notes (Signed)
Called NCCU, BP 80s/50s. Map <65. New order for bolus. Provider notified that before bolus was administered. HR went up to 147 then back to low 100s sinus rhythm. Will continue to monitor.

## 2018-07-14 NOTE — Nurses Notes (Signed)
Spoke with NCCU pupils unequal/brisk per pupillometry. Otherwise, neuro exam unchanged. Instructed to call provider if assessment worsens. Will continue to monitor.

## 2018-07-14 NOTE — Nurses Notes (Signed)
Per tele, patient's HR went into the 150s for 9 seconds; patient's HR currently in the low 100s.  NCCU notified.  No new orders at this time.  Will continue to monitor.

## 2018-07-14 NOTE — Care Plan (Signed)
[name] VS/I&O assessments. Neuro assessments per MD order. GCS 1-5-1. Cooling blanket used PRN . Pt tolerated well. Turned q2h. NAD. VSS.    Patient Goals Based on Modifiable Risk Factors    The following modifiable risk factors were discussed with patient:     Hypertension    Goal:   Maintain blood pressure less than 140/90 (or less than 130/90 for patients with diabetes or chronic kidney disease).    Patient's selected lifestyle modification(s):  Routinely take antihypertensive medications as prescribed      Hypercholesteremia    Goal(s):  HDL greater than 50    Patient's selected lifestyle modification(s):  Increase daily physical activity to 30 to 60 minutes      Diabetes Mellitus    Goal:   HbA1C less than 7 percent    Patient's selected lifestyle modification(s):  Adhere to recommended exercise program      Cigarette Smoking    Goal(s):  Complete Cessation    Patient's selected lifestyle modification(s):  Stop smoking with support of education on skills to quit      Unhealthy Diet    Goal:   Maintain a healthy balanced diet.    Patient's selected lifestyle modification(s):  Meet with a dietician for healthy food sources      Physical Inactivity    Goal:   Increase daily physical activity.    Patient's selected lifestyle modification(s):  Complete a specific physical activity plan    Patient/Family/Caregiver response to plan:    Patient unable to participate in POC/education R/T AMS.      Problem: Adult Inpatient Plan of Care  Goal: Plan of Care Review  Outcome: Ongoing (see interventions/notes)  Flowsheets (Taken 07/14/2018 0426)  Plan of Care Reviewed With: patient; caregiver; daughter  Goal: Patient-Specific Goal (Individualization)  Outcome: Ongoing (see interventions/notes)  Flowsheets (Taken 07/13/2018 1440 by Deatra James, RN)  Patient-Specific Goals (Include Timeframe): per daughter, to get up in the chair in the morning with the cooling blanket of.  Goal: Absence of Hospital-Acquired Illness or  Injury  Outcome: Ongoing (see interventions/notes)  Goal: Optimal Comfort and Wellbeing  Outcome: Ongoing (see interventions/notes)  Goal: Rounds/Family Conference  Outcome: Ongoing (see interventions/notes)

## 2018-07-14 NOTE — Nurses Notes (Signed)
Restraint Continuation      Patient continues to have the following condition  AMS.       Least restrictive alternatives attempted : Considered 1:1 sit, Provided 1:1 observation, Increase patient observation, moved closer to the nurses station, bed-exit alarm/motion detector, call light accessible, concealed tubes/lines, decreased/removed stimulus, frequent orientation and involved patient in conversation    Patient continues to exhibit the following behaviors: uncooperative and withdrawn    The restraint continued to facilitate medical/surgical treatment to ensure safety.    The patient will continue to be evaluated and assessments documented on the flowsheet to ensure that the patient is released from the restraint at the earliest possible time

## 2018-07-14 NOTE — Procedures (Signed)
NAMESHERMEKA, RUTT   HOSPITAL NUMBER:  [id number]  DATE OF SERVICE:  07/11/2018  DOB:  [birth year]  SEX:  F      Date of Study:  July 11, 2018, 7:40 a.m. through July 12, 2018, at 8:56 a.m.   Sex:  Female.   EEG #:  [id number].   Technician:  None listed.    REQUESTING PHYSICIAN:  [name], MD.    HISTORY:  The patient is a 57 year old female who presented with progressive right-sided weakness following resection of a left hemisphere meningioma and craniectomy.  Video EEG was requested to work up for underlying intracranial abnormalities and seizure potential.    MEDICATIONS:  Keppra.    REPORT:  This is a digitally acquired EEG following the standard 10-20 system of electrode placement.      Awake background is continuous and asymmetric. Initially, on the left it consisted of disorganized delta with some theta. On the right it consisted of poorly-organized theta and delta. There were no sleep transients seen throughout the study, however, there was a state change recognized. There were left frontal lateralized periodic discharges. Initially these were nearly-continuous at 2-3 Hz and fluctuating in frequency. These improved with benzodiazepines (at 0842 and 1434).    There was significant improvement. By the end of the study there was a 7 Hz posterior dominant rhythm on the right. The left background consisted of disorganized theta with some delta and alpha, and on the right consisted of poorly organized alpha and theta, with some delta. Epileptiform discharges improved to abundant 1-2 Hz LPDs in wakefulness and abundant single left hemispheric sharp waves on the right.    There were no pushbutton events or clinical spells that were marked.    INTERPRETATION:  This is an abnormal inpatient continuous video EEG due to   1. Left hemispheric LPD's, 2-3 Hz and fluctuating, indicating highly-increased epileptogenic potential and likely ongoing seizure. This improves significantly with  medications  2. Slowing over the left hemisphere indicating left hemispheric dysfunction  3. Diffuse slowing indicating nonspecific encephalopathy    Overall there was improvement throughout the study.  Clinical correlation required.         Jaynee Eagles, MD    Ilda Mori, MD  Assistant Professor   [facility]        CC:   [name], MD   Fax: [phone]       DD:  07/12/2018 16:03:33  DT:  07/14/2018 03:31:02 SV  D#:  [id number]    I read this EEG and agree with the resident's report. Any exceptions have been edited by me.  Ilda Mori, MD

## 2018-07-14 NOTE — Respiratory Therapy (Signed)
07/14/18 0350   Weaning Parameters   $ Weaning Parameters S   Min. Volume 12.9 Liters   Resp Rate 30 Breath Per Minute   Avg. VT 430 mL   FVC   (Unable to follow directions)   NIF -60 cmH2O   RSBI 70   Cuff Leaked Assessment Leak Not Present  (Service aware)

## 2018-07-15 ENCOUNTER — Inpatient Hospital Stay (HOSPITAL_COMMUNITY): Payer: PPO

## 2018-07-15 ENCOUNTER — Inpatient Hospital Stay (HOSPITAL_COMMUNITY)

## 2018-07-15 ENCOUNTER — Encounter (HOSPITAL_COMMUNITY): Payer: Self-pay

## 2018-07-15 ENCOUNTER — Inpatient Hospital Stay (HOSPITAL_COMMUNITY): Payer: PPO | Admitting: Certified Registered"

## 2018-07-15 ENCOUNTER — Encounter (HOSPITAL_COMMUNITY)
Admission: EM | Disposition: A | Discharge status: Skilled Nursing Facility | Payer: Self-pay | Source: Ambulatory Visit | Attending: Anesthesiology

## 2018-07-15 DIAGNOSIS — R64 Cachexia: Secondary | ICD-10-CM

## 2018-07-15 DIAGNOSIS — J9690 Respiratory failure, unspecified, unspecified whether with hypoxia or hypercapnia: Secondary | ICD-10-CM

## 2018-07-15 DIAGNOSIS — R918 Other nonspecific abnormal finding of lung field: Secondary | ICD-10-CM

## 2018-07-15 DIAGNOSIS — Z930 Tracheostomy status: Secondary | ICD-10-CM

## 2018-07-15 DIAGNOSIS — E46 Unspecified protein-calorie malnutrition: Secondary | ICD-10-CM

## 2018-07-15 DIAGNOSIS — R569 Unspecified convulsions: Secondary | ICD-10-CM

## 2018-07-15 LAB — URINALYSIS, MACROSCOPIC
BILIRUBIN: NEGATIVE mg/dL
BLOOD: NEGATIVE mg/dL
COLOR: NORMAL
GLUCOSE: NEGATIVE mg/dL
KETONES: NEGATIVE mg/dL
NITRITE: NEGATIVE
PH: 8 (ref 5.0–8.0)
PROTEIN: NEGATIVE mg/dL
SPECIFIC GRAVITY: 1.015 (ref 1.005–1.030)
UROBILINOGEN: NEGATIVE mg/dL

## 2018-07-15 LAB — URINALYSIS, MICROSCOPIC
RBCS: 31 /hpf — ABNORMAL HIGH (ref <6.0)
WBCS: 8 /hpf (ref <11.0)

## 2018-07-15 LAB — PROCALCITONIN: PROCALCITONIN: <0.05 ng/mL (ref <0.50)

## 2018-07-15 LAB — CBC
HCT: 20.6 % — ABNORMAL LOW (ref 34.8–46.0)
HCT: 24.7 % — ABNORMAL LOW (ref 34.8–46.0)
HGB: 6.8 g/dL — CL (ref 11.5–16.0)
HGB: 8 g/dL — ABNORMAL LOW (ref 11.5–16.0)
MCH: 30 pg (ref 26.0–32.0)
MCH: 29.3 pg (ref 26.0–32.0)
MCHC: 33 g/dL (ref 31.0–35.5)
MCHC: 32.4 g/dL (ref 31.0–35.5)
MCV: 90.7 fL (ref 78.0–100.0)
MCV: 90.5 fL (ref 78.0–100.0)
MPV: 10.2 fL (ref 8.7–12.5)
MPV: 10.7 fL (ref 8.7–12.5)
PLATELETS: 378 10*3/uL (ref 150–400)
PLATELETS: 298 10*3/uL (ref 150–400)
RBC: 2.27 10*6/uL — ABNORMAL LOW (ref 3.85–5.22)
RBC: 2.73 10*6/uL — ABNORMAL LOW (ref 3.85–5.22)
RDW-CV: 15 % (ref 11.5–15.5)
RDW-CV: 15 % (ref 11.5–15.5)
RDW-CV: 14.9 % (ref 11.5–15.5)
WBC: 11 10*3/uL (ref 3.7–11.0)
WBC: 11.3 10*3/uL — ABNORMAL HIGH (ref 3.7–11.0)

## 2018-07-15 LAB — VENOUS BLOOD GAS
%FIO2 (VENOUS): 30 %
BASE EXCESS: 4.3 mmol/L — ABNORMAL HIGH (ref ?–3.0)
BICARBONATE (VENOUS): 28.1 mmol/L — ABNORMAL HIGH (ref 22.0–26.0)
PCO2 (VENOUS): 34 mmHg — ABNORMAL LOW (ref 41.00–51.00)
PH (VENOUS): 7.51 (ref 7.31–7.41)
PO2 (VENOUS): 55 mmHg — ABNORMAL HIGH (ref 35.0–50.0)

## 2018-07-15 LAB — OSMOLALITY: OSMOLALITY, BLOOD: 288 mOsm/kg (ref 280–300)

## 2018-07-15 LAB — BASIC METABOLIC PANEL
ANION GAP: 6 mmol/L (ref 4–13)
BUN/CREA RATIO: 33 — ABNORMAL HIGH (ref 6–22)
BUN: 19 mg/dL (ref 8–25)
CALCIUM: 8.5 mg/dL (ref 8.5–10.2)
CHLORIDE: 105 mmol/L (ref 96–111)
CO2 TOTAL: 28 mmol/L (ref 22–32)
CREATININE: 0.57 mg/dL (ref 0.49–1.10)
ESTIMATED GFR: >60 mL/min/{1.73_m2} (ref >60)
GLUCOSE: 110 mg/dL (ref 65–139)
POTASSIUM: 3.7 mmol/L (ref 3.5–5.1)
SODIUM: 139 mmol/L (ref 136–145)

## 2018-07-15 LAB — PHOSPHORUS: PHOSPHORUS: 2.9 mg/dL (ref 2.4–4.7)

## 2018-07-15 LAB — POC BLOOD GLUCOSE (RESULTS)
GLUCOSE, POC: 106 mg/dL — ABNORMAL HIGH (ref 70–105)
GLUCOSE, POC: 116 mg/dl — ABNORMAL HIGH (ref 70–105)
GLUCOSE, POC: 118 mg/dL — ABNORMAL HIGH (ref 70–105)
GLUCOSE, POC: 125 mg/dl — ABNORMAL HIGH (ref 70–105)
GLUCOSE, POC: 127 mg/dL — ABNORMAL HIGH (ref 70–105)
GLUCOSE, POC: 140 mg/dl — ABNORMAL HIGH (ref 70–105)

## 2018-07-15 LAB — CLOSTRIDIUM DIFFICILE TOXIN DETECTION: CLOSTRIDIUM DIFFICILE TOXIN DETECTION: NEGATIVE

## 2018-07-15 LAB — MAGNESIUM: MAGNESIUM: 2.2 mg/dL (ref 1.6–2.6)

## 2018-07-15 SURGERY — TRACHEOSTOMY
Anesthesia: General | Site: Abdomen | Wound class: Clean Contaminated Wounds-The respiratory, GI, Genital, or urinary

## 2018-07-15 MED ORDER — SODIUM CHLORIDE 0.9 % INTRAVENOUS SOLUTION
INTRAVENOUS | Status: DC | PRN
Start: 2018-07-15 — End: 2018-07-15

## 2018-07-15 MED ORDER — AMANTADINE HCL 50 MG/5 ML ORAL SOLUTION
100.00 mg | Freq: Two times a day (BID) | ORAL | Status: DC
Start: 2018-07-15 — End: 2018-07-16
  Administered 2018-07-15: 100 mg via GASTROSTOMY
  Filled 2018-07-15 (×3): qty 10

## 2018-07-15 MED ORDER — ONDANSETRON HCL (PF) 4 MG/2 ML INJECTION SOLUTION
Freq: Once | INTRAMUSCULAR | Status: DC | PRN
Start: 2018-07-15 — End: 2018-07-15
  Administered 2018-07-15 (×2): 4 mg via INTRAVENOUS

## 2018-07-15 MED ORDER — CEFAZOLIN 1 GRAM SOLUTION FOR INJECTION
Freq: Once | INTRAMUSCULAR | Status: DC | PRN
Start: 2018-07-15 — End: 2018-07-15
  Administered 2018-07-15: 2000 mg via INTRAVENOUS

## 2018-07-15 MED ORDER — SUGAMMADEX 100 MG/ML INTRAVENOUS SOLUTION
Freq: Once | INTRAVENOUS | Status: DC | PRN
Start: 2018-07-15 — End: 2018-07-15
  Administered 2018-07-15: 180 mg via INTRAVENOUS

## 2018-07-15 MED ORDER — DEXAMETHASONE SODIUM PHOSPHATE 4 MG/ML INJECTION SOLUTION
Freq: Once | INTRAMUSCULAR | Status: DC | PRN
Start: 2018-07-15 — End: 2018-07-15
  Administered 2018-07-15: 4 mg via INTRAVENOUS

## 2018-07-15 MED ORDER — LIDOCAINE 1 %-EPINEPHRINE 1:100,000 INJECTION SOLUTION
INTRAMUSCULAR | Status: AC
Start: 2018-07-15 — End: 2018-07-15
  Filled 2018-07-15: qty 30

## 2018-07-15 MED ORDER — LORAZEPAM 2 MG/ML INJECTION SOLUTION
2.0000 mg | INTRAMUSCULAR | Status: AC
Start: 2018-07-16 — End: 2018-07-15

## 2018-07-15 MED ORDER — FENTANYL (PF) 50 MCG/ML INJECTION SOLUTION
Freq: Once | INTRAMUSCULAR | Status: DC | PRN
Start: 2018-07-15 — End: 2018-07-15
  Administered 2018-07-15 (×4): 50 ug via INTRAVENOUS

## 2018-07-15 MED ORDER — SODIUM CHLORIDE 0.9 % IRRIGATION SOLUTION
1000.0000 mL | Status: DC | PRN
Start: 2018-07-15 — End: 2018-07-15
  Administered 2018-07-15: 1000 mL

## 2018-07-15 MED ORDER — PHENYLEPHRINE 0.5 MG/5 ML (100 MCG/ML)IN 0.9 % SOD.CHLORIDE IV SYRINGE
INJECTION | Freq: Once | INTRAVENOUS | Status: DC | PRN
Start: 2018-07-15 — End: 2018-07-15
  Administered 2018-07-15 (×2): 100 ug via INTRAVENOUS
  Administered 2018-07-15 (×2): 200 ug via INTRAVENOUS
  Administered 2018-07-15: 100 ug via INTRAVENOUS

## 2018-07-15 MED ORDER — ROCURONIUM 10 MG/ML INTRAVENOUS SOLUTION
Freq: Once | INTRAVENOUS | Status: DC | PRN
Start: 2018-07-15 — End: 2018-07-15
  Administered 2018-07-15: 50 mg via INTRAVENOUS
  Administered 2018-07-15: 20 mg via INTRAVENOUS

## 2018-07-15 MED ORDER — BANANA FLAKES-TRANSGALACTOOLIGOSACCHARIDE ORAL POWDER PACKET
1.00 | Freq: Three times a day (TID) | ORAL | Status: DC
Start: 2018-07-15 — End: 2018-07-18
  Administered 2018-07-15: 8 g via GASTROSTOMY
  Administered 2018-07-15: 0 g via GASTROSTOMY
  Administered 2018-07-16 – 2018-07-18 (×6): 8 g via GASTROSTOMY

## 2018-07-15 MED ORDER — LIDOCAINE (PF) 10 MG/ML (1 %) INJECTION SOLUTION
30.0000 mL | Freq: Once | INTRAMUSCULAR | Status: DC | PRN
Start: 2018-07-15 — End: 2018-07-15
  Administered 2018-07-15: 3 mL via INTRAMUSCULAR

## 2018-07-15 MED ORDER — LACOSAMIDE 200 MG/20 ML INTRAVENOUS SOLUTION
200.0000 mg | Freq: Once | INTRAVENOUS | Status: DC
Start: 2018-07-15 — End: 2018-07-15

## 2018-07-15 MED ORDER — SODIUM CHLORIDE 0.9 % IV BOLUS
40.0000 mL | INJECTION | Freq: Once | Status: AC | PRN
Start: 2018-07-15 — End: 2018-07-15

## 2018-07-15 MED ORDER — LIDOCAINE 1 %-EPINEPHRINE 1:100,000 INJECTION SOLUTION
15.0000 mL | Freq: Once | INTRAMUSCULAR | Status: DC | PRN
Start: 2018-07-15 — End: 2018-07-15
  Administered 2018-07-15: 1 mL via INTRAMUSCULAR

## 2018-07-15 MED ORDER — LACOSAMIDE 200 MG/20 ML INTRAVENOUS SOLUTION
100.00 mg | Freq: Two times a day (BID) | INTRAVENOUS | Status: DC
Start: 2018-07-16 — End: 2018-07-16

## 2018-07-15 MED ORDER — LEVETIRACETAM 500 MG/5 ML INTRAVENOUS SOLUTION
3000.0000 mg | INTRAVENOUS | Status: AC
Start: 2018-07-15 — End: 2018-07-15
  Administered 2018-07-15: 0 mg via INTRAVENOUS
  Administered 2018-07-15: 3000 mg via INTRAVENOUS
  Filled 2018-07-15: qty 30

## 2018-07-15 MED ORDER — VASOPRESSIN 20 UNIT/ML INTRAVENOUS SOLUTION
Freq: Once | INTRAVENOUS | Status: DC | PRN
Start: 2018-07-15 — End: 2018-07-15
  Administered 2018-07-15: 1 [IU] via INTRAVENOUS
  Administered 2018-07-15: 0.5 [IU] via INTRAVENOUS

## 2018-07-15 MED ORDER — AMANTADINE HCL 50 MG/5 ML ORAL SOLUTION
50.00 mg | Freq: Two times a day (BID) | ORAL | Status: DC
Start: 2018-07-15 — End: 2018-07-15

## 2018-07-15 MED ORDER — LEVETIRACETAM 500 MG TABLET
1000.00 mg | ORAL_TABLET | Freq: Two times a day (BID) | ORAL | Status: DC
Start: 2018-07-16 — End: 2018-07-15

## 2018-07-15 MED ORDER — LACOSAMIDE 200 MG/20 ML INTRAVENOUS SOLUTION
200.0000 mg | Freq: Once | INTRAVENOUS | Status: AC
Start: 2018-07-15 — End: 2018-07-15
  Administered 2018-07-15: 200 mg via INTRAVENOUS
  Filled 2018-07-15: qty 20

## 2018-07-15 MED ORDER — PHENYTOIN SODIUM 50 MG/ML INTRAVENOUS SOLUTION
100.0000 mg | Freq: Three times a day (TID) | INTRAVENOUS | Status: DC
Start: 2018-07-16 — End: 2018-07-20
  Administered 2018-07-16 – 2018-07-20 (×12): 100 mg via INTRAVENOUS
  Filled 2018-07-15 (×15): qty 2

## 2018-07-15 MED ORDER — EPHEDRINE SULFATE 50 MG/ML INJECTION SOLUTION
Freq: Once | INTRAMUSCULAR | Status: DC | PRN
Start: 2018-07-15 — End: 2018-07-15
  Administered 2018-07-15: 5 mg via INTRAVENOUS

## 2018-07-15 MED ORDER — LEVETIRACETAM 500 MG TABLET
1500.00 mg | ORAL_TABLET | Freq: Two times a day (BID) | ORAL | Status: DC
Start: 2018-07-16 — End: 2018-08-08
  Administered 2018-07-16 – 2018-08-07 (×46): 1500 mg via GASTROSTOMY
  Filled 2018-07-15 (×22): qty 3
  Filled 2018-07-15: qty 1
  Filled 2018-07-15 (×9): qty 3
  Filled 2018-07-15: qty 1
  Filled 2018-07-15 (×14): qty 3

## 2018-07-15 MED ORDER — LORAZEPAM 2 MG/ML INJECTION SOLUTION
INTRAMUSCULAR | Status: AC
Start: 2018-07-15 — End: 2018-07-15
  Administered 2018-07-15: 2 mg via INTRAVENOUS
  Filled 2018-07-15: qty 1

## 2018-07-15 MED ORDER — PHENYTOIN SODIUM 50 MG/ML INTRAVENOUS SOLUTION
15.0000 mg/kg | Freq: Once | INTRAVENOUS | Status: AC
Start: 2018-07-16 — End: 2018-07-16
  Administered 2018-07-16: 0 mg via INTRAVENOUS
  Administered 2018-07-16: 1000 mg via INTRAVENOUS
  Filled 2018-07-15: qty 20

## 2018-07-15 MED ADMIN — Medication: @ 11:00:00

## 2018-07-15 MED ADMIN — amino ac-protein hydro-whey protein 10 gram-100 kcal/30 mL oral liquid: @ 14:00:00

## 2018-07-15 MED ADMIN — lisinopriL 10 mg tablet: @ 11:00:00

## 2018-07-15 MED ADMIN — ascorbic acid (vitamin C) 500 mg tablet: @ 11:00:00

## 2018-07-15 MED ADMIN — sugammadex 100 mg/mL intravenous solution: INTRAVENOUS | @ 13:00:00

## 2018-07-15 MED ADMIN — lacosamide 200 mg/20 mL intravenous solution: INTRAVENOUS | @ 20:00:00

## 2018-07-15 MED ADMIN — HYDROcodone 5 mg-acetaminophen 325 mg tablet: @ 14:00:00

## 2018-07-15 MED ADMIN — sodium chloride 0.9 % intravenous solution: @ 14:00:00 | NDC 00338004904

## 2018-07-15 MED ADMIN — acetaminophen 325 mg tablet: @ 11:00:00

## 2018-07-15 MED ADMIN — nystatin 100,000 unit/gram topical cream: @ 11:00:00 | NDC 00168005415

## 2018-07-15 MED ADMIN — sodium chloride 0.9 % (flush) injection syringe: INTRAVENOUS | @ 12:00:00

## 2018-07-15 MED ADMIN — lactated Ringers intravenous solution: INTRAVENOUS | @ 07:00:00 | NDC 00338011704

## 2018-07-15 MED ADMIN — morphine 4 mg/mL intravenous syringe: @ 11:00:00

## 2018-07-15 MED ADMIN — cyclopentolate 1 % eye drops: @ 14:00:00

## 2018-07-15 SURGICAL SUPPLY — 95 items
APPL 70% ISPRP 2% CHG 26ML 13._2X13.2IN CHLRPRP PREP DEHP-FR (WOUND CARE/ENTEROSTOMAL SUPPLY)
APPL 70% ISPRP 2% CHG 26ML CHLRPRP HI-LT ORNG PREP STRL LF  DISP CLR (WOUND CARE SUPPLY) IMPLANT
BAG DRAIN UROLOGY 2000ML LF_154003 20EA/CS (UROLOGICAL SUPPLIES) ×1
BINDER ABDOMINAL 10IN SLD PNL HKLP CLSR 27-48IN UNIV NONST LF (ORTHOPEDICS (NOT IMPLANTS)) IMPLANT
BINDER ABDOMINAL 82-94X9IN HKL_P CLSR 3 PNL ELAS 2XL BARI (ORTHOPEDICS (NOT IMPLANTS))
BINDER ABDOMINAL 9IN HKLP CLSR 3 PNL ELAS 75-84IN XL NONST LF (ORTHOPEDICS (NOT IMPLANTS)) IMPLANT
BINDER ABDOMINAL 9IN MULTIPANEL HKLP CLSR 85-94IN 2XL BARI NONST LF (ORTHOPEDICS (NOT IMPLANTS)) IMPLANT
BITE BLOCK ADULT LATEX FREE_000429 CS/50 (AIR) ×1
BLANKET MISTRAL-AIR ADULT LWR BODY 55.9X40.2IN FRC AIR HI VOL BLWR INTUITIVE CONTROL PNL LRG LED (MISCELLANEOUS PT CARE ITEMS) ×2 IMPLANT
BLANKET WARMER 55.9INW X 40.2I LOWER BODY MISTRAL-AIR (MISCELLANEOUS PT CARE ITEMS) ×1
BLOCK BITE 20MM PE ADULT MOUTHPC STRAP RETENTION RIM LUM SCPSVR LF  LRG 27MM GRN NONST DISP (AIR) ×2 IMPLANT
CATH SUCT ARLF TRIFLO 10FR CNT_RL VLV STR D MRKNG LF (Suction)
CATH SUCT ARLF TRIFLO 10FR CONTROL PORT STRL LF (Suction) IMPLANT
CATH SUCT ARLF TRIFLO 14FR STR 2 3ANG EYE CONTROL PORT BVL TIP STRL LF  DISP (Suction) IMPLANT
CATH SUCT ARLF TRIFLO 14FR STR_PK CNTRL STRL LF (Suction)
CATH SUCT ARLF TRIFLO 18FR 2 3ANG EYE BVL TIP CONN CONTROL PORT STRL LF  DISP CLR (Suction) IMPLANT
CATH SUCT ARLF TRIFLO 18FR CNT RL VLV STR D MRKNG STRL LF (Suction)
CONV USE 23866 - NEEDLE HYPO 27GA 1.5IN STD MONOJECT SS POLYPROP REG BVL LL HUB UL SHRP ANTICORE YW STRL LF  DISP (NEEDLES & SYRINGE SUPPLIES) ×2 IMPLANT
CONV USE 320027 - CHILDRENS USE 320025 - KIT RM TURNOVER CSTM NONST LF (KITS & TRAYS (DISPOSABLE)) ×2
CONV USE 320027 - CHILDRENS USE 320025 - KIT RM TURNOVER CUSTOM NONST LF (KITS & TRAYS (DISPOSABLE)) ×2 IMPLANT
CONV USE 404719 - PACK SURG SIRUS MAYO BASIC V TBL STAND CVR 90X50IN 53X24IN LF (CUSTOM TRAYS & PACK) ×2
CONV USE ITEM 153413 - TUBE TRACH SHLY 10.8MM 6.4MM 7_4MM 6 CUF DISP INR CANN STRL (AIR) ×2 IMPLANT
CONV USE ITEM 309979 - DRAPE DIAMOND FENESTRATE HKLP CLSR 121X102X77IN THR T PRXM LF  STRL DISP SURG SMS (PROTECTIVE PRODUCTS/GARMENTS) ×2
CONV USE ITEM 337687 - DRAPE REINF FNFLD 90X44IN LF  STRL DISP SURG (EQUIPMENT MINOR)
CONV USE ITEM 337890 - PACK SURG BSIN 2 STRL LF  DISP (CUSTOM TRAYS & PACK) ×2 IMPLANT
CONV USE ITEM 337905 - KIT SURG MIN STUP STRL DISP LF (KITS & TRAYS (DISPOSABLE)) ×2 IMPLANT
CONV USE ITEM 343591 - SOLIDIFY FLUID 1500ML DSPNSR L_Q TX SOLIDIFY SFTP LTS+ DISP (STER) ×2 IMPLANT
COVER WAND RFD STRL 50EA/CS 01-0020 (EQUIPMENT MINOR) ×1
COVER WND RF DETECT STRL CLR EQP (EQUIPMENT MINOR) ×2 IMPLANT
DISC USE 162466 - BAG DRAIN UROLOGY 2000ML LF_154003 20EA/CS (UROLOGICAL SUPPLIES) ×2 IMPLANT
DONUT POSITION 9IN HEAD FM (SUPP) ×1
DRAPE 2 LYR ABS 70X40IN MED UN_IV LF DISP SURG BILAMINATE (PROTECTIVE PRODUCTS/GARMENTS) ×1
DRAPE DIAMOND FENESTRATE HKLP CLSR 121X102X77IN THR T PRXM LF  STRL DISP SURG SMS (PROTECTIVE PRODUCTS/GARMENTS) ×2 IMPLANT
DRAPE DIAMOND FENESTRATE HKLP_CLSR 121X102X77IN THR T PRXM (PROTECTIVE PRODUCTS/GARMENTS) ×1
DRAPE FNFLD SHEET 70X40IN MED PRXM LF  STRL DISP SURG SMS (PROTECTIVE PRODUCTS/GARMENTS) ×2 IMPLANT
DRAPE REINF FNFLD 90X44IN LF  STRL DISP SURG (EQUIPMENT MINOR) IMPLANT
DRAPE REINF FNFLD 90X44IN LF_STRL DISP SURG (EQUIPMENT MINOR)
DUPE USE ITEM 319443 - SUTURE SILK 3-0 PERMAHAND 24IN_BLK BRD TIE NONAB (SUTURE/WOUND CLOSURE) ×2 IMPLANT
DUPE USE ITEM 319444 - SUTURE 0 V-7 PROLENE 36IN BLU_2 ARM MONOF NONAB (SUTURE/WOUND CLOSURE) ×2 IMPLANT
ELECTRODE ESURG BLADE PNCL 10FT VLAB STRL SS DISP BUTTON SWH HEX LOCK CORD HLSTR LF  ACPT 3/32IN STD (CAUTERY SUPPLIES) ×2 IMPLANT
ELECTRODE ESURG BLADE PNCL 10F_T VLAB STRL SS DISP BUTTON SWH (CAUTERY SUPPLIES) ×1
ELECTRODE PATIENT RTN 9FT VLAB C30- LB RM PHSV ACRL FOAM CORD NONIRRITATE NONSENSITIZE ADH STRP (CAUTERY SUPPLIES) ×2 IMPLANT
ELECTRODE PATIENT RTN 9FT VLAB_REM C30- LB PLHSV ACRL FOAM (CAUTERY SUPPLIES) ×1
EXCHNG HEATMOIST FLTR TAPER EN_D GAS SP FLXB TUBE LF PORTEX (INSTRUMENTS) ×1
EXCHNG HEATMOIST TAPER END FILTER GAS SP FLXB TUBE LF  PORTEX 22MM 15MM DISP NONST 27GM HMDFCN SYS (SURGICAL INSTRUMENTS) ×2 IMPLANT
GAUZE SURG 4X4IN RFDETECT RF A_SSURE COTTON 16 PLY XRY DTBL (WOUND CARE/ENTEROSTOMAL SUPPLY)
GAUZE SURG 4X4IN STD RFDETECT COTTON 16 PLY XRY ABS LF  STRL DISP (WOUND CARE SUPPLY) IMPLANT
GOWN SURG XL AAMI L3 NONREINFO RCE HKLP CLSR STRL LTX PNK SMS (DGOW)
GOWN SURG XL L3 NONREINFORCE HKLP CLSR STRL LTX PNK SMS 47IN (DGOW) IMPLANT
HANDLE RIGID PLASTIC STRL LF  DISP DVN EZ HNDL SURG LIGHT (INSTRUMENTS) ×2 IMPLANT
HANDPC SUCT MEDIVAC YANKAUER B LBS TIP CLR STRL LF DISP (Suction) ×1
HANDPC SUCT MEDIVAC YANKAUER BLBS TIP CLR STRL LF  DISP (Suction) ×2 IMPLANT
HEMOSTAT ABS 8X4IN FLXB SHR WV SRGCL STRL DISP (WOUND CARE/ENTEROSTOMAL SUPPLY) ×1
HEMOSTAT ABS 8X4IN FLXB SHR WV_SRGCL STRL DISP (WOUND CARE SUPPLY) ×2 IMPLANT
HOLDER TRACH TUBE_8197M 12EA/BX (AIR) ×1
HOLDER TUBE MED CUFFLATOR PSY FOAM TRACH TIE NK COL PAD HKLP CLSR 9-17IN 8.5X1IN TRACH LF (AIR) ×2 IMPLANT
JELLY LUB PDI BCTRST H2O SOL N ONSTAIN NGRS STRL GLYC MTHY (INSTRUMENTS) ×2
JELLY LUB PDI BCTRST H2O SOL N ONSTAIN NGRS STRL GLYC MTHY (SURGICAL INSTRUMENTS) ×4 IMPLANT
KIT ENDOS ENDOZIME BDSD SLR SPONGE ENZM DETERGENT 500ML (DIS) ×2 IMPLANT
KIT ENDOS ENZM BDSD SLR SPONGE ENZM DTRG 500ML (DIS) ×1
KIT MINOR SURG SET UP_CS/28 (KITS & TRAYS (DISPOSABLE)) ×1
KIT PEG 20FR SIL PUL RADOPQ ME_D PORT MIC SECURLOK 12ML STRL ×2 IMPLANT
KIT RM TURNOVER CSTM NONST LF  DISP (KITS & TRAYS (DISPOSABLE)) ×1
KIT ROOM TURNOVER ~~LOC~~ CUSTOM (KITS & TRAYS (DISPOSABLE)) ×1
KIT SURG MIN STUP STRL DISP LF (KITS & TRAYS (DISPOSABLE)) ×2
LABEL E-Z STICK_STLEZP1 100EA/CS (LABELS/CHART SUPPLIES) ×1
LABEL MED EZ PEEL MRKR LF (LABELS/CHART SUPPLIES) ×2 IMPLANT
MBO USE ITEM 317672 - HANDLE RIGID PLASTIC STRL LF  DISP DVN EZ HNDL SURG LIGHT (SURGICAL INSTRUMENTS) ×2 IMPLANT
NEEDLE HYPO 27GA 1.5IN STD MO NOJECT SS POLYPROP REG BVL LL (NEEDLES & SYRINGE SUPPLIES) ×1
PACK BASIN DBL CUSTOM (CUSTOM TRAYS & PACK) ×1
PACK SURG SIRUS MAYO BASIC V TBL STAND CVR 90X50IN 53X24IN LF (CUSTOM TRAYS & PACK) ×2 IMPLANT
PAD ARMBOARD FOAM BLU FP-ECARM (POSITIONING PRODUCTS) ×2
PAD ARMBRD BLU (POSITIONING PRODUCTS) ×4 IMPLANT
POSITION POSITION 9IN DONUT HEAD FOAM (SUPP) ×2 IMPLANT
SOLIDIFY FLUID 1500ML DSPNSR L_Q TX SOLIDIFY SFTP LTS+ DISP (STER) ×3 IMPLANT
SPONGE DISSECTOR KITTNER STRL_MDS73512 1/4X9/16IN 100PK5/CS (WOUND CARE/ENTEROSTOMAL SUPPLY) ×1
SPONGE GAUZE 4X4IN EXCLN AMD PHMB 6 PLY ANTIMIC NWVN HYPOALL LF  STRL DISP (WOUND CARE SUPPLY) ×2 IMPLANT
SPONGE LAP 9/16X.25IN DSCT RADOPQ FBR STRL (WOUND CARE SUPPLY) ×2 IMPLANT
SPONGE SPLIT DRAIN 7088 12BX/CS 25PK/BX (WOUND CARE/ENTEROSTOMAL SUPPLY) ×1
STRAP POSITION KNEE FOAM SFT ADJ CNTCT CLSR LF (POSITIONING PRODUCTS) ×2 IMPLANT
SUTURE 0 V-7 PROLENE 36IN BLU_2 ARM MONOF NONAB (SUTURE/WOUND CLOSURE) ×1
SUTURE 3-0 PS-1 ETHILON 18.0I_N BLK NYLON MONOF NYL N/ABSB (SUTURE/WOUND CLOSURE) ×3 IMPLANT
SUTURE SILK 2-0 KS PERMAHAND 3 0IN BLK BRD NONAB (SUTURE/WOUND CLOSURE)
SUTURE SILK 2-0 KS PERMAHAND 30IN BLK BRD NONAB (SUTURE/WOUND CLOSURE) IMPLANT
SUTURE SILK 2-0 SH PERMAHAND 30IN BLK BRD NONAB (SUTURE/WOUND CLOSURE) ×2 IMPLANT
SUTURE SILK 2-0 SH PERMAHAND 3_0IN BLK BRD NONAB (SUTURE/WOUND CLOSURE) ×1
SUTURE SILK 3-0 PERMAHAND 24IN_BLK BRD TIE NONAB (SUTURE/WOUND CLOSURE) ×1
SYRINGE BD LL 10ML LF STRL CO_NTROL CONCEN TIP PRGN FREE (NEEDLES & SYRINGE SUPPLIES) ×1
SYRINGE LL 10ML LF  STRL CONTROL CONCEN TIP PRGN FREE DEHP-FR MED DISP (NEEDLES & SYRINGE SUPPLIES) ×2 IMPLANT
SYRINGE LL 30ML LF  STRL CONCEN TIP GRAD N-PYRG DEHP-FR MED DISP (NEEDLES & SYRINGE SUPPLIES) ×2 IMPLANT
SYRINGE LL 30ML LF STRL CONCE_N TIP GRAD N-PYRG DEHP-FR MED (NEEDLES & SYRINGE SUPPLIES) ×1
TUBE TRACH SHLY 10.8MM 6.4MM 7_4MM 6 CUF DISP INR CANN STRL (AIR) ×1
TUBING SUCT CLR 20FT 9/32IN MEDIVAC NCDTV M/M CONN STRL LF (Suction) ×2 IMPLANT
TUBING SUCT CLR 6FT 3/16IN MEDIVAC MXGR MALE TO MALE CONN NCDTV STRL LF (Suction) ×2 IMPLANT
TUBING SUCT CONN 20FT LONG_STRL N720A (Suction) ×1

## 2018-07-15 NOTE — Care Plan (Signed)
Occupational Therapy Contact Note    OT attempted to provide care to [facility] today at 10:52. Care could not be delivered at that time due to off the floor for a procedure/surgery. Patient to OR for trach/PEG. To follow up post-op as appropriate and as schedule allows. Thank you for this consult.    Darcus Pester, MOT, OTR/L  Pager #: [phone]

## 2018-07-15 NOTE — Nurses Notes (Signed)
Spoke with NCCU, critical hgb 6.8.

## 2018-07-15 NOTE — Care Plan (Signed)
Patient s/p crani & trach/PEG.  GCS 1/5/1.  SBP maintained <160.  Patient shows no signs of pain.  Trach/PEG completed.  LUE restraint maintained to prevent patient from pulling at mechanical ventilation.  Skin precautions maintained with turn Q2 and preventive mepilex.  Safety precautions maintained with hourly rounding. Plan for ATC.    Patient Goals Based on Modifiable Risk Factors    The following modifiable risk factors were discussed with patient:     Hypertension    Goal:   Maintain blood pressure less than 140/90 (or less than 130/90 for patients with diabetes or chronic kidney disease).    Patient's selected lifestyle modification(s):  Routinely take antihypertensive medications as prescribed    Patient/Family/Caregiver response to plan:    Patient intubated/trached and shows no evidence of learning.  Will continue to monitor for educational opportunities.       Problem: Adult Inpatient Plan of Care  Goal: Plan of Care Review  Outcome: Ongoing (see interventions/notes)  Flowsheets (Taken 07/15/2018 1300)  Plan of Care Reviewed With: patient;family  Goal: Patient-Specific Goal (Individualization)  Outcome: Ongoing (see interventions/notes)  Flowsheets (Taken 07/15/2018 1459)  Individualized Care Needs: please call her "Kim"  Anxieties, Fears or Concerns: Per family, "what do you do with the trach/peg?"  Patient-Specific Goals (Include Timeframe): Trach & PEG today  Goal: Absence of Hospital-Acquired Illness or Injury  Outcome: Ongoing (see interventions/notes)  Goal: Optimal Comfort and Wellbeing  Outcome: Ongoing (see interventions/notes)  Goal: Rounds/Family Conference  Outcome: Ongoing (see interventions/notes)  Flowsheets (Taken 07/15/2018 1459)  Participants: family; advanced practice nurse; nursing; patient; pharmacy; physician; physical therapy; respiratory therapy     Problem: Communication Impairment (Mechanical Ventilation, Invasive)  Goal: Effective Communication  Outcome: Ongoing (see  interventions/notes)     Problem: Device-Related Complication Risk (Mechanical Ventilation, Invasive)  Goal: Optimal Device Function  Outcome: Ongoing (see interventions/notes)     Problem: Inability to Wean (Mechanical Ventilation, Invasive)  Goal: Mechanical Ventilation Liberation  Outcome: Ongoing (see interventions/notes)     Problem: Nutrition Impairment (Mechanical Ventilation, Invasive)  Goal: Optimal Nutrition Delivery  Outcome: Ongoing (see interventions/notes)     Problem: Skin and Tissue Injury (Mechanical Ventilation, Invasive)  Goal: Absence of Device-Related Skin and Tissue Injury  Outcome: Ongoing (see interventions/notes)     Problem: Ventilator-Induced Lung Injury (Mechanical Ventilation, Invasive)  Goal: Absence of Ventilator-Induced Lung Injury  Outcome: Ongoing (see interventions/notes)     Problem: Fall Injury Risk  Goal: Absence of Fall and Fall-Related Injury  Outcome: Ongoing (see interventions/notes)     Problem: Skin Injury Risk Increased  Goal: Skin Health and Integrity  Outcome: Ongoing (see interventions/notes)     Problem: Non-violent/Non-Self Destructive Restraints  Goal: Alternative methods tried prior to restraints  Outcome: Ongoing (see interventions/notes)  Goal: Patient free from injury and discomfort  Outcome: Ongoing (see interventions/notes)  Goal: Autonomy maintained at the highest possible level  Outcome: Ongoing (see interventions/notes)  Goal: Need for restraints reassessed per policy  Outcome: Ongoing (see interventions/notes)  Goal: Patient education provided  Outcome: Ongoing (see interventions/notes)  Goal: Problem Interventions  Outcome: Ongoing (see interventions/notes)     Problem: Hypertension Comorbidity  Goal: Blood Pressure in Desired Range  Outcome: Ongoing (see interventions/notes)     Problem: Depression  Goal: Improved Mood  Outcome: Ongoing (see interventions/notes)     Problem: Feeding Intolerance (Enteral Nutrition)  Goal: Feeding Tolerance  Outcome:  Ongoing (see interventions/notes)     Problem: Bleeding (Craniotomy/Craniectomy/Cranioplasty)  Goal: Absence of Bleeding  Outcome: Ongoing (see interventions/notes)  Problem: Cerebral Tissue Perfusion Risk (Craniotomy/Craniectomy/Cranioplasty)  Goal: Effective Cerebral Tissue Perfusion  Outcome: Ongoing (see interventions/notes)     Problem: Fluid Imbalance (Craniotomy/Craniectomy/Cranioplasty)  Goal: Fluid Balance  Outcome: Ongoing (see interventions/notes)     Problem: Infection (Craniotomy/Craniectomy/Cranioplasty)  Goal: Absence of Infection Signs/Symptoms  Outcome: Ongoing (see interventions/notes)     Problem: Pain (Craniotomy/Craniectomy/Cranioplasty)  Goal: Acceptable Pain Control  Outcome: Ongoing (see interventions/notes)     Problem: Bleeding (Surgery Nonspecified)  Goal: Absence of Bleeding  Outcome: Ongoing (see interventions/notes)     Problem: Bowel Elimination Impaired (Surgery Nonspecified)  Goal: Effective Bowel Elimination  Outcome: Ongoing (see interventions/notes)     Problem: Infection (Surgery Nonspecified)  Goal: Absence of Infection Signs/Symptoms  Outcome: Ongoing (see interventions/notes)     Problem: Pain (Surgery Nonspecified)  Goal: Acceptable Pain Control  Outcome: Ongoing (see interventions/notes)     Problem: Communication Impairment (Artificial Airway)  Goal: Effective Communication  Outcome: Ongoing (see interventions/notes)     Problem: Device-Related Complication Risk (Artificial Airway)  Goal: Optimal Device Function  Outcome: Ongoing (see interventions/notes)     Problem: Skin and Tissue Injury (Artificial Airway)  Goal: Absence of Device-Related Skin or Tissue Injury  Outcome: Ongoing (see interventions/notes)

## 2018-07-15 NOTE — Anesthesia OR-ICU Handoff (Signed)
Anesthesia ICU Transfer of Care  [name] is a 57 y.o. ,female, Weight: 88.7 kg (195 lb 8.8 oz)   had Procedure(s):  TRACHEOSTOMY  GASTROSCOPY WITH PLACEMENT PEG TUBE  performed  07/15/18   Primary Service: [name], MD    Past Medical History:   Diagnosis Date   . Anxiety    . Arthropathy    . Cancer (CMS HCC)     brain tumor   . Dyspnea on exertion    . Esophageal reflux     does not take meds   . Heart murmur     benign   . HTN (hypertension)    . Hyperlipemia    . Muscle weakness     right sided weakness   . Palpitations    . Shortness of breath    . Wears glasses     reading      Allergy History as of 07/15/18      No Known Allergies              I completed my ICU transfer of care/ Handoff to the ICU receiving personnel during which we discussed :  Access, Airway, All key and critical aspects of case discussed, Analgesia, Antibiotics, Expectation of post procedure, Fluids/Product, Gave opportunity for questions and acknowledgement of understanding, Labs and PMHx                                                                     Last OR Temp: Temperature: 37.2 C (99 F)  ABG:  PH (ARTERIAL)   Date Value Ref Range Status   07/11/2018 7.48 (H) 7.35 - 7.45 Final     PH (T)   Date Value Ref Range Status   07/05/2018 7.31 (L) 7.35 - 7.45 Final     PCO2 (ARTERIAL)   Date Value Ref Range Status   07/11/2018 36.0 35.0 - 45.0 mm/Hg Final     PCO2 (VENOUS)   Date Value Ref Range Status   07/15/2018 34.00 (L) 41.00 - 51.00 mm/Hg Final     PO2 (ARTERIAL)   Date Value Ref Range Status   07/11/2018 88.0 72.0 - 100.0 mm/Hg Final     PO2 (VENOUS)   Date Value Ref Range Status   07/15/2018 55.0 (H) 35.0 - 50.0 mm/Hg Final     SODIUM   Date Value Ref Range Status   07/05/2018 141 137 - 145 mmol/L Final     POTASSIUM   Date Value Ref Range Status   07/15/2018 3.7 3.5 - 5.1 mmol/L Final     KETONES   Date Value Ref Range Status   07/15/2018 Negative Negative mg/dL Final     WHOLE BLOOD POTASSIUM   Date Value Ref  Range Status   07/05/2018 3.6 3.5 - 4.6 mmol/L Final     CHLORIDE   Date Value Ref Range Status   07/05/2018 112 (H) 101 - 111 mmol/L Final     CALCIUM   Date Value Ref Range Status   07/15/2018 8.5 8.5 - 10.2 mg/dL Final     Calculated P Axis   Date Value Ref Range Status   07/12/2018 41 degrees Final     Calculated R Axis   Date Value Ref Range Status  07/11/2018 -30 degrees Final     Calculated T Axis   Date Value Ref Range Status   07/12/2018 50 degrees Final     IONIZED CALCIUM   Date Value Ref Range Status   07/07/2018 1.18 1.10 - 1.35 mmol/L Final     LACTATE   Date Value Ref Range Status   07/10/2018 1.1 0.0 - 1.3 mmol/L Final     HEMOGLOBIN   Date Value Ref Range Status   07/05/2018 10.3 (L) 12.0 - 18.0 g/dL Final     OXYHEMOGLOBIN   Date Value Ref Range Status   07/05/2018 96.8 85.0 - 98.0 % Final     CARBOXYHEMOGLOBIN   Date Value Ref Range Status   07/05/2018 0.0 0.0 - 2.5 % Final     MET-HEMOGLOBIN   Date Value Ref Range Status   07/05/2018 0.0 0.0 - 2.0 % Final     BASE EXCESS   Date Value Ref Range Status   07/15/2018 4.3 (H) -3.0 - 3.0 mmol/L Final     BASE EXCESS (ARTERIAL)   Date Value Ref Range Status   07/11/2018 3.4 (H) 0.0 - 1.0 mmol/L Final     BASE DEFICIT   Date Value Ref Range Status   07/05/2018 6.2 (H) 0.0 - 3.0 mmol/L Final     BICARBONATE (ARTERIAL)   Date Value Ref Range Status   07/11/2018 27.6 (H) 18.0 - 26.0 mmol/L Final     BICARBONATE (VENOUS)   Date Value Ref Range Status   07/15/2018 28.1 (H) 22.0 - 26.0 mmol/L Final     TEMPERATURE, COMP   Date Value Ref Range Status   07/05/2018 37.0 15.0 - 40.0 C Final     %FIO2 (VENOUS)   Date Value Ref Range Status   07/15/2018 30.0 % Final     Airway:  Tracheostomy 6 Cuffed (Active)   Trach Status Secured 07/15/2018  1:00 PM   Site Condition Leaking 07/15/2018  1:00 PM   [place] Site Cleaned;Dressing Changed 07/15/2018  1:00 PM   [place] @ Bedside? Yes 07/15/2018  1:00 PM   Bilateral General Breath Sounds Diminished 07/15/2018   1:00 PM

## 2018-07-15 NOTE — Ancillary Notes (Signed)
[facility]  Neurolab Tech Note    [name]  DATE OF SERVICE:  07/15/2018          EEG has been completed.      Kateri Plummer, [facility]

## 2018-07-15 NOTE — Respiratory Therapy (Signed)
Pt currently on vent settings of CPAP / PS 30 % PEEP 5 PS 8  Saturations of 97 %  Pt does have gag reflex when suctioning but eyes do not open   Will continue to monitor pt airway and oxygen requirements

## 2018-07-15 NOTE — Respiratory Therapy (Signed)
07/15/18 0535   Weaning Parameters   $ Weaning Parameters S   Min. Volume 11.6 Liters   Resp Rate 25 Breath Per Minute   Avg. VT 464 mL   FVC   (Unable to follow instructions)   NIF -60 cmH2O   RSBI 54   Cuff Leaked Assessment Leak Not Present

## 2018-07-15 NOTE — Care Management Notes (Signed)
[facility]  Care Management Note    Patient Name: [name]  Date of Birth: 1960/08/10  Sex: female  Date/Time of Admission: 07/05/2018  7:26 AM  Room/Bed: OR 5 N INTRA-OP/NONE  Payor: HEALTH PLAN / Plan: HEALTH PLAN (PPO-NON ST EMP) / Product Type: PPO /    LOS: 10 days   Primary Care Providers:  Isac Caddy, MD, MD (General)    Admitting Diagnosis:  Meningioma (CMS [facility]) [D32.9]    Assessment:      07/15/18 1047   Assessment Details   Assessment Type Continued Assessment   Date of Care Management Update 07/15/18   Date of Next DCP Update 07/18/18   Care Management Plan   Discharge Planning Status plan in progress   Projected Discharge Date 07/18/18   Discharge Needs Assessment   Discharge Facility/Level of Care Needs Undetermined at this time   Patient not medically ready for discharge at this time. Plan is to continue to monitor patient progress.  Patient is in OR for trach/PEG at present time.    Discharge Plan:  Undetermined at this time  Discharge plans undetermined at present time pending patient progress post trach/PEG.    The patient will continue to be evaluated for developing discharge needs.     Case Manager: Claudius Sis, [place] COORDINATOR  Phone: [phone]

## 2018-07-15 NOTE — Anesthesia Postprocedure Evaluation (Signed)
Anesthesia Post Op Evaluation    Patient: [name]  Procedure(s):  TRACHEOSTOMY  GASTROSCOPY WITH PLACEMENT PEG TUBE    Last Vitals:Temperature: 37.2 C (99 F) (07/15/18 1304)  Heart Rate: 85 (07/15/18 1445)  BP (Non-Invasive): 118/70 (07/15/18 1445)  Respiratory Rate: 19 (07/15/18 1445)  SpO2: 100 % (07/15/18 1445)    Patient is sufficiently recovered from the effects of anesthesia to participate in the evaluation and has returned to their pre-procedure level.  Patient location during evaluation: bedside   Post-procedure handoff checklist completed    Patient participation: complete - patient participated  Level of consciousness: awake and alert and responsive to verbal stimuli  Pain management: adequate  Airway patency: patent  Anesthetic complications: no  Cardiovascular status: acceptable  Respiratory status: acceptable  Hydration status: acceptable  Patient post-procedure temperature: Pt Normothermic   PONV Status: Absent

## 2018-07-15 NOTE — Nurses Notes (Signed)
Patient returned from OR. Primary nurse at bedside.

## 2018-07-15 NOTE — Nurses Notes (Signed)
Patient taken to OR for trach/PEG procedure.  Will continue to monitor upon return.

## 2018-07-15 NOTE — Progress Notes (Addendum)
[facility]  Neurocritical Care (NCCU) Progress Note    [name], [name]  Date of Admission:  07/05/2018  Date of Service: 07/15/2018  Date of Birth:  [birth year]    Primary Attending: [name], MD   Primary Service: NEUROSURGERY [address] [place]     Chief Complaint: Right sided weakness s/p left tentorial meningioma resection      Subjective: Febrile overnight. Received 1u pRBCs. Made NPO @ midnight for possible ass on trach and peg today.        Vital Signs:  Temp (24hrs) Max:38.6 C (767.2 F)      Systolic (09OBS), JGG:836 , Min:90 , OQH:476     Diastolic (54YTK), PTW:65, Min:56, Max:89    Temp  Avg: 37.8 C (100.1 F)  Min: 37 C (98.6 F)  Max: 38.6 C (101.5 F)  MAP (Non-Invasive)  Avg: 84.2 mmHG  Min: 67 mmHG  Max: 106 mmHG  Pulse  Avg: 106.3  Min: 88  Max: 121  Resp  Avg: 20.9  Min: 18  Max: 28  SpO2  Avg: 98.1 %  Min: 92 %  Max: 100 %  Pain Score (Numeric, Faces): 3    Current Medications:  acetaminophen (TYLENOL) 160 mg per 5 mL liquid - grape flavored, 650 mg, Gastric (NG, OG, PEG, GT), Q4H PRN  ascorbic acid (VITAMIN C) tablet, 500 mg, Gastric (NG, OG, PEG, GT), 2x/day  bacitracin 500 units/gram topical ointment tube, , Topical, 2x/day  carvedilol (COREG) tablet, 12.5 mg, Gastric (NG, OG, PEG, GT), 2x/day-Food  chlorhexidine gluconate (PERIDEX) 0.12% mouthwash, 15 mL, Swish & Spit, 2x/day  enoxaparin PF (LOVENOX) 40 mg/0.4 mL SubQ injection, 40 mg, Subcutaneous, Q24H  famotidine (PEPCID) tablet, 20 mg, Gastric (NG, OG, PEG, GT), 2x/day  fentaNYL (SUBLIMAZE) 50 mcg/mL injection, 25 mcg, Intravenous, Q1H PRN  hydrALAZINE (APRESOLINE) injection 10 mg, 10 mg, Intravenous, Q4H PRN  HYDROcodone-acetaminophen (NORCO) 5-325 mg per tablet, 1 Tab, Gastric (NG, OG, PEG, GT), Q4H PRN    Or  HYDROcodone-acetaminophen (NORCO) 5-325 mg per tablet, 2 Tab, Gastric (NG, OG, PEG, GT), Q4H PRN  labetalol (TRANDATE) 5 mg/mL injection, 10 mg, Intravenous, Q1H PRN  lanolin-oxyquin-pet, hydrophil (BAG BALM)  topical ointment, , Apply Topically, 2x/day PRN  levETIRAcetam (KEPPRA) tablet 750 mg, 750 mg, Gastric (NG, OG, PEG, GT), 2x/day  lisinopril (PRINIVIL) tablet, 40 mg, Gastric (NG, OG, PEG, GT), Daily  nicotine (NICODERM CQ) transdermal patch (mg/24 hr), 14 mg, Transdermal, Daily  NS bolus infusion 40 mL, 40 mL, Intravenous, Once PRN  NS flush syringe, 2 mL, Intracatheter, Q8HRS    And  NS flush syringe, 2-6 mL, Intracatheter, Q1 MIN PRN  NS flush syringe, 10-30 mL, Intracatheter, Q8HRS  NS flush syringe, 20-30 mL, Intracatheter, Q1 MIN PRN  nutrition protein supplement 15 g per 30 mL packet, 1 Packet, Gastric (NG, OG, PEG, GT), 2x/day  nystatin (MYCOSTATIN) 100,000 units per mL oral liquid, 5 mL, Swish & Spit, 4x/day  nystatin (NYSTOP) 100,000 units/g topical powder, , Apply Topically, 2x/day  ondansetron (ZOFRAN) 2 mg/mL injection, 4 mg, Intravenous, Q6H PRN  perflutren lipid microspheres (DEFINITY) 1.3 mL in NS 10 mL (tot vol) injection, 2 mL, Intravenous, Give in Cardiology  polyethylene glycol (MIRALAX) oral packet, 17 g, Gastric (NG, OG, PEG, GT), Daily PRN  potassium bicarbonate-citric acid (EFFER-K) effervescent tablet, 40 mEq, Gastric (NG, OG, PEG, GT), Daily with Breakfast  senna concentrate (SENNA) 521m per 146moral liquid, 10 mL, Gastric (NG, OG, PEG, GT), 2x/day  SSIP insulin lispro (HUMALOG) 100  units/mL injection, 0-12 Units, Subcutaneous, Q4H PRN      Today's Physical Exam:   General: Appears very ill   Neurologic: E1 M5 V1T: does not open eyes, intubated (not sedated).  PERRL, no facial weakness, + cough + corneals.  Motor: LUE localizes, RUE and BLE withdraw. UTA sensation  Cardiovascular:    Heart regular rate and rhythm  Lungs: diminished bilaterally  Abdomen: soft, non-tender and bowel sounds normal  Extremities: no cyanosis or edema  Skin: Skin warm and dry    Labs:  I have reviewed all lab results.    I/O:  I/O last 24 hours:      Intake/Output Summary (Last 24 hours) at 07/15/2018 0717  Last  data filed at 07/15/2018 0600  Gross per 24 hour   Intake 3452 ml   Output 1325 ml   Net 2127 ml     I/O current shift:  No intake/output data recorded.    Drips:   Current Facility-Administered Medications   Medication Dose Frequency Last Rate         Radiology Results:  MRI brain w/wo contrast 07/06/18: "acute postoperative changes related to gross total resection of a left medial tentorial meningioma with previously seen mass effect on the left thalamus, left middle cerebral peduncle and left midbrain. The current examination demonstrate hemorrhagic products are identified within the resection cavity extending into the parenchyma of the left thalamus and tracking down within the left midbrain and extending to the fourth Ventricle."        Impression and Plan:    Active Hospital Problems    Diagnosis   . 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 Tobacco use disorder   . Hypokalemia     57 year old female with progressive R sided weakness s/p left meningioma resection s/p Craniectomy for evacuation of tumor bed hematoma .       Neurologic:  L Tentorial meningioma s/p craniotomy for tumor resection, s/p L fronto-temporal craniectomy with evacuation for tumor bed hemorrhage  -- Q1H neuro checks as patient is at risk for acute neurological decline  -- MRI brain w/wo contrast 07/06/18: see above for read  - Continue Keppra 750 mg BID   -- STAT CT brain without contrast for any acute change in neurological status     Pain/sedation  -- Tylenol PRN, none overnight    Cardiovascular:    HTN, chronic  -- SBP goal < 160  -- Hydralazine/labetalol PRN- x0/x1 overnight  -- Current antihypertensives:  -- Coreg 12.5 BID (increased 12/22)  -- Lisinopril 69m daily\  -- ECG 12/20- ST    Respiratory:    Acute respiratory failure requiring mechanical ventilation  Ventilator Settings:   Set PEEP: 5 cmH2O  Pressure Support: 8 cmH2O  FiO2: 30 %  -- CXR on 07/13/18: No  acute process  -- currently requiring frequent suctioning  -- No Cuff leak  -- Consulted trauma for trach/PEG    Renal: No acute issues  - Daily BMP  -Foley in place d/t excoriation     Infectious Disease: leucocytosis, improving, febrile  -- Cultures:    -- Blood, urine, MRSA, and BAL negative    -- Completed on cefepime y-day    Hematology:Normocytic anemia   -1u pRBCs 12/23 for hbg 6.8  - recheck H&H this afternoon   -continue to monitor and transfuse if hb<7    DVT prophylaxis  - SCD's / lovenox     Gastrointestinal:  At risk for constipation  -  last BM 12/23  - Miralax/senna scheduled  - Pepcid 20 mg BID    Nutrition  - Osmolite 1.5 5 cans/day with FWF 200 Q6  - Vit C    Endocrine:  No acute issues  -- SSIP conservative  -- Glucose 106-120    Integumentary: Excoriation in inner thigh area  -Bag balm as needed  -Foley in palce    Psychiatric:  No acute issues  -- Monitor for ICU delirium     Prophylaxis:    DVT/PE Prophylaxis: SCDs/ Venodynes/Impulse boots  GI: Famotidine  Mouth: Peridex    Family:  None present at bedside  Disposition/ Baseline:  ICU  Advance Directives:  None-Discussed  Hospice involvement prior to admission?  no    Code Status Information     Code Status    Full Code            Theodora Blow, APRN,NP-C  07/15/2018, 07:33    See Resident or Midlevel's Note for further details.    Critical Care Attestation   I have reviewed the resident/ midlevel's note.  I agree with their findings and/or have made additions/ edits as well as my findings above.    I was present at the bedside of this critically ill patient for 40 minutes exclusive of procedures that are documented elsewhere.  My services were independent and non-duplicative of other practioners of other specialties (non-Neurocritical Care).    This patient suffers from failure or dysfunction of Neurological, respiratory, GI system(s).   Neurological-    L tentorial meningioma s/p resection with post-operative hemorrhage s/p L DHC, stable on  repeat neuroimaging. Continue nightly q2hr neurochecks. PT/OT.  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   L PLEDs. Continue keppra, repeat 1 hour EEG   Cardiovascular-HTN-well controlled on lisinopril, coreg.   Respiratory- Acute respiratory failure requiring mechanical ventilation. Somnolence precludes extubation. Copious oral secretions, minimal inline. Awaiting trach/PEG today.  Infectious disease- Mild leukocytosis, fever - completed cefepime 12/22 empirically for PNA. All cultures thus far negative. Rule out DVT. Repeat UA in the setting of fever, foley, check procalcitonin.  Gastrointestinal-Diarrhea-send C. Diff. Consider adding banana flakes. Holding TEN for trach/PEG-resume 6 hours post-surgery if ok with Trauma  Renal- Maintain foley given skin excoriations and persistent diarrhea.   Hematological-Subacute anemia, drifted below 7 and received PRBC transfusion with appropriate response. Continue lovenox, SCDs  The care of this patient was in regard to managing (a) conditions(s) that has a high probability of sudden, clinically significant, or life-threatening deterioration and required a high degree of Attending Physician attention and direct involvement to intervene urgently. Data review and care planning was performed in direct proximity of the patient, examination was obviously performed in direct contact with the patient. All of this time was exclusive of procedure which will be documented elsewhere in the chart.   My critical care time involved full attention to the patient's condition and included:   Review of nursing notes and/or old charts   Review of medications, allergies, and vital signs   Documentation time   Consultant collaboration on findings and treatment options   Care, transfer of care, and discharge plans   Ordering, interpreting, and reviewing diagnostic studies/ lab tests   Obtaining necessary history from family, EMS, nursing home staff and/or treating physicians     My critical  care time did not include time spent teaching resident physician(s) or other services of resident physicians, or performing other reported procedures.  Total Critical Care Time: 11 minutes  Jalene Mullet, MD    Addendum 07/18/18:  Unable to clinically determine etiology of small thalamic infarct occurring after admission.    Jalene Mullet, MD

## 2018-07-15 NOTE — Progress Notes (Signed)
[facility]  NEUROSURGERY   PROGRESS NOTE    [name], [name], 57 y.o. female  Date of Admission:  07/05/2018  Date of Service: 07/15/2018  Date of Birth:  May 21, 1961     Chief Complaint: right upper and lower extremity weakness    Subjective:   Trauma consulted for trach/PEG. Hgb 6.8.    Vital Signs:  Temp (24hrs) Max:38.6 C (387.5 F)      Systolic (64PPI), RJJ:884 , Min:90 , ZYS:063     Diastolic (01SWF), UXN:23, Min:56, Max:89    Temp  Avg: 37.8 C (100.1 F)  Min: 37 C (98.6 F)  Max: 38.6 C (101.5 F)  MAP (Non-Invasive)  Avg: 84.6 mmHG  Min: 67 mmHG  Max: 106 mmHG  Pulse  Avg: 105.6  Min: 88  Max: 121  Resp  Avg: 20.7  Min: 16  Max: 28  SpO2  Avg: 97.9 %  Min: 92 %  Max: 100 %  Pain Score (Numeric, Faces): 3    Min/Max/Avg ICP/CPP last 24hrs:   No data recorded    Today's Physical Exam:    Appears acutely ill, intubated  GCS 1 5 1  T  -- Eyes do not open to noxious stimuli  -- Localizes in LUE, Withdraws in RUE and BLE  PERRL  Face symmetric  +cough    Unable to assess hearing  Unable to assess speech  Unable to assess fund of knowledge  Unable to assess attention span & concentration  Unable to assess recent and remote memory  CN 3 4 6  EOMI Unable to assess  CN 11 shrug symmetric Unable to assess  Muscle Strength Unable to assess  Unable to assess drift    -- L cranial staples c/d/i  -- JP drain Rapide  -- LD Rapide    Assessment/Plan:  [name] is a 57 yo F PMH HTN, HLD w/ 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 POD10/10.  -- FU trauma for Trach/PEG date  -- Keppra 750 mg BID (no end date)   -- Will need outpatient neurology follow-up  -- Please bathe patient daily and clean wound daily  -- Okay for soap/shampoo  -- Apply bacitracin ointment BID, cleaning old  ointment off before application of new ointment  -- Final pathology: WHO grade I meningioma    -- Imaging:    -- TTE (07/12/18): EF 75%, no regional wall abnormalities   -- CT brain w/o (07/11/18): stable    -- CT brain w/o (07/09/18 @ 1328):stable   -- CT brain w/o (07/09/18 @ 0430):stable   -- CT brain w/o (07/08/18):postop changes from decompressive craniectomy with interval mild extracalvarial brain herniation through the craniectomy site; mild increase in cerebral edema at the tumor resection bed and adjacent parenchyma without significant increase in hemorrhage   -- MRI brain w/wo (07/06/18):gross total resection of a left medial tentorial meningioma; hemorrhagic productswithin the resection cavity extending into the parenchyma of the left thalamus and tracking down within the left midbrain and extending to the fourth ventricle   -- CT brain w/o (07/06/18): postop changes from left decompressive craniectomy for evacuation of hematoma of tumor bed with residual hemorrhage and improved edema and rightward midline shift  -- CT brain w/o (07/06/18): postop changes from decompressive craniectomy for evacuation of tumor bed hematoma with minimal residual blood at the surgical site   --  CT brain w/o (07/05/18): postop changes with development of hematoma at tumor bed and 8 mm of midline shift  -- Pain/spasm control: Tylenol PRN, Fentanyl PRN, Norco PRN  -- Diet: NPO + Osmolite TF + FWF 200 cc q6h  -- Bowel regimen, last BM 07/15/18  -- Abx: none  -- Activity: up in chair with assist   -- Craniectomy precautions, helmet to be worn at all times when OOB  -- PT/OT: AR vs SNF  -- DVT ppx: Lovenox, SCDs/Venodynes  -- Consults:    NCCU/Neurology   -- Keppra 750 mg BID   -- Will need outpatient neurology follow-up for AED management   Trauma   -- Will plan for trach and PEG this upcoming week  -- Lines/Drains: Midline, OG-tube, Foley catheter, ET-tube  -- Wound: L fronto-temporal staples, JP drain Rapide,  LD Rapide, helmet  -- Disposition: ongoing    Keane Police, M.D.  PGY-2 Neurosurgery  07/15/2018, 08:58        Late entry for 07/15/18. I saw and examined the patient.  I reviewed the resident's note.  I agree with the findings and plan of care as documented in the resident's note.  Any exceptions/additions are edited/noted.    Merilyn Baba, MD

## 2018-07-15 NOTE — Nurses Notes (Signed)
Restraint Continuation      Patient continues to have the following condition AMS.       Least restrictive alternatives attempted : Considered 1:1 sit, Provided 1:1 observation, Family called, Increase patient observation, bed-exit alarm/motion detector, call light accessible, concealed tubes/lines and decreased/removed stimulus    Patient continues to exhibit the following behaviors: uncooperative and withdrawn    The restraint continued to facilitate medical/surgical treatment to ensure safety.    The patient will continue to be evaluated and assessments documented on the flowsheet to ensure that the patient is released from the restraint at the earliest possible time

## 2018-07-15 NOTE — Anesthesia Transfer of Care (Signed)
ANESTHESIA TRANSFER OF CARE   [name] is a 57 y.o. ,female, Weight: 88.7 kg (195 lb 8.8 oz)   had Procedure(s):  TRACHEOSTOMY  GASTROSCOPY WITH PLACEMENT PEG TUBE  performed  07/15/18   Primary Service: [name], MD    Past Medical History:   Diagnosis Date   . Anxiety    . Arthropathy    . Cancer (CMS HCC)     brain tumor   . Dyspnea on exertion    . Esophageal reflux     does not take meds   . Heart murmur     benign   . HTN (hypertension)    . Hyperlipemia    . Muscle weakness     right sided weakness   . Palpitations    . Shortness of breath    . Wears glasses     reading      Allergy History as of 07/15/18      No Known Allergies              I completed my transfer of care / handoff to the receiving personnel during which we discussed:  Access, Airway, All key/critical aspects of case discussed, Analgesia, Antibiotics, Expectation of post procedure, Fluids/Product, Gave opportunity for questions and acknowledgement of understanding, Labs and PMHx                                              Additional Info:Patient transported to NCCU ventilated via ambu. Vitals stable and report given upon arrival.                       Last OR Temp: Temperature: 37.2 C (99 F)  ABG:  PH (ARTERIAL)   Date Value Ref Range Status   07/11/2018 7.48 (H) 7.35 - 7.45 Final     PH (T)   Date Value Ref Range Status   07/05/2018 7.31 (L) 7.35 - 7.45 Final     PCO2 (ARTERIAL)   Date Value Ref Range Status   07/11/2018 36.0 35.0 - 45.0 mm/Hg Final     PCO2 (VENOUS)   Date Value Ref Range Status   07/15/2018 34.00 (L) 41.00 - 51.00 mm/Hg Final     PO2 (ARTERIAL)   Date Value Ref Range Status   07/11/2018 88.0 72.0 - 100.0 mm/Hg Final     PO2 (VENOUS)   Date Value Ref Range Status   07/15/2018 55.0 (H) 35.0 - 50.0 mm/Hg Final     SODIUM   Date Value Ref Range Status   07/05/2018 141 137 - 145 mmol/L Final     POTASSIUM   Date Value Ref Range Status   07/15/2018 3.7 3.5 - 5.1 mmol/L Final     KETONES   Date Value Ref Range  Status   07/11/2018 Negative Negative mg/dL Final     WHOLE BLOOD POTASSIUM   Date Value Ref Range Status   07/05/2018 3.6 3.5 - 4.6 mmol/L Final     CHLORIDE   Date Value Ref Range Status   07/05/2018 112 (H) 101 - 111 mmol/L Final     CALCIUM   Date Value Ref Range Status   07/15/2018 8.5 8.5 - 10.2 mg/dL Final     Calculated P Axis   Date Value Ref Range Status   07/12/2018 41 degrees Final     Calculated  R Axis   Date Value Ref Range Status   07/11/2018 -30 degrees Final     Calculated T Axis   Date Value Ref Range Status   07/12/2018 50 degrees Final     IONIZED CALCIUM   Date Value Ref Range Status   07/07/2018 1.18 1.10 - 1.35 mmol/L Final     LACTATE   Date Value Ref Range Status   07/10/2018 1.1 0.0 - 1.3 mmol/L Final     HEMOGLOBIN   Date Value Ref Range Status   07/05/2018 10.3 (L) 12.0 - 18.0 g/dL Final     OXYHEMOGLOBIN   Date Value Ref Range Status   07/05/2018 96.8 85.0 - 98.0 % Final     CARBOXYHEMOGLOBIN   Date Value Ref Range Status   07/05/2018 0.0 0.0 - 2.5 % Final     MET-HEMOGLOBIN   Date Value Ref Range Status   07/05/2018 0.0 0.0 - 2.0 % Final     BASE EXCESS   Date Value Ref Range Status   07/15/2018 4.3 (H) -3.0 - 3.0 mmol/L Final     BASE EXCESS (ARTERIAL)   Date Value Ref Range Status   07/11/2018 3.4 (H) 0.0 - 1.0 mmol/L Final     BASE DEFICIT   Date Value Ref Range Status   07/05/2018 6.2 (H) 0.0 - 3.0 mmol/L Final     BICARBONATE (ARTERIAL)   Date Value Ref Range Status   07/11/2018 27.6 (H) 18.0 - 26.0 mmol/L Final     BICARBONATE (VENOUS)   Date Value Ref Range Status   07/15/2018 28.1 (H) 22.0 - 26.0 mmol/L Final     TEMPERATURE, COMP   Date Value Ref Range Status   07/05/2018 37.0 15.0 - 40.0 C Final     %FIO2 (VENOUS)   Date Value Ref Range Status   07/15/2018 30.0 % Final     Airway:  Tracheostomy 6 Cuffed (Active)   Trach Status Secured 07/15/2018 12:24 PM   Site Condition WDL 07/15/2018 12:24 PM     Blood pressure (!) 96/42, pulse 80, temperature 37.2 C (99 F), resp. rate (!)  22, height 1.676 m (5' 5.98"), weight 88.7 kg (195 lb 8.8 oz), SpO2 100 %.

## 2018-07-15 NOTE — Nurses Notes (Signed)
Restraint Continuation      Patient continues to have the following condition AMS      Least restrictive alternatives attempted : frequent orientation, reoriented to person, place, time, and treatment, encouraged relaxation techniques, redirected behavior and assessed meds/medical problems    Patient continues to exhibit the following behaviors: uncooperative    The restraint continued to facilitate medical/surgical treatment to ensure safety.    The patient will continue to be evaluated and assessments documented on the flowsheet to ensure that the patient is released from the restraint at the earliest possible time

## 2018-07-15 NOTE — Nurses Notes (Signed)
Called NCCU, critical lab. Pt PH 7.51. Will continue to monitor.

## 2018-07-15 NOTE — OR Surgeon (Signed)
Operative Report    [facility]  Division of Trauma and Emergency Surgery      Date of Surgery: 07/15/2018  Pre Op Dx: Inanition, respiratory failure, decreased mental status after L craniectomy for meningioma complicated by intracranial hemorrhage  Post Op Dx: Inanition, as above   Procedure:  Percutaneous Endoscopic Gastrostomy (PEG) tube placement, Esophagogastroduodenoscopy (EGD), tracheostomy    Surgeon: Dr. Darlyn Read  Assistants: Jerry Caras, PGY-4    Anesthesia: General endotracheal anesthesia    EBL:  10cc   Findings: Normal  Complications: were no complications  Specimen removed: None  Device/s Implanted: Yes - 20 F PEG 3 cm at the skin, 6 cuffed shiley tracheostomy tube    Procedure: After informed consent was obtained from her MPOA and daughter, the patient's identity was confirmed and a JCAHO timeout was performed.  The procedure was performed in supine position.  The abdomen was prepped and draped in the usual sterile fashion.  A plastic bite block was placed carefully between the patient's dentition.  A flexible video gastroscope was then advanced into the hypopharynx, esophagus, and stomach.  Lesions, Ulcers, and masses were not seen at these levels. The stomach was insufflated with air and the anterior abdominal wall was transilluminated.  The intended insertion site 2 finger breadths caudal to the left costal margin was located at a site of transillumination.  Digital indentation at the intended insertion site was visible with the scope.  The insertion site was infiltrated with a local anesthetic. The needle was turned perpendicular to the skin and passed through the skin and stomach wall.  Air was aspirated at the same time the tip of the needle was visualized to enter the lumen of the stomach.  A 14 gauge IV catheter was advanced through the same tract with visual confirmation of entry into the gastric lumen.  A guidewire was passed through the catheter and snared intraluminally.   The guidewire was pulled retrograde through the upper GI tract and attached to the PEG tube.  The PEG and guidewire were pulled antegrade through the foregut until the flange of the PEG was seen to rest against the inner wall of the stomach.  The tube easily rotated and an external bolster was placed.  The gastroscope was advanced through the pylorus, the bulb and proximal duodenum was Normal.  The tube was at 3 cm at skin level.  The air within the stomach was aspirated as the scope was withdrawn.  The patient tolerated the procedure well.  All sponge, needle, and instrument counts were correct.      Dr. Darlyn Read  was present for the entire viewing portion of the endoscopy from insertion to removal of scope. Next we positioned and prepared for the tracheostomy.     The neck was prepped and draped in the usual sterile fashion.  A JCAHO timeout was performed, all information was confirmed as correct.  A longitudinal incision was made overlying the trachea.  Using electrocautery, the incision was deepened throught the platysma and soft tissue of the neck.  They thyroid was encountered and the isthmus hemostatically divided with electrocautery.  The anterior surface of the trachea was exposed with good visualization.With the cuff down, stay sutures of Prolene were placed laterally on each side of the trachea. A #6  Cuffed Shiley tracheostomy tube was tested and prepared for insertion.  The balloon of the endotracheal tube was deflated.  A trapdoor incision was made on the anterior surface of the trachea between the 2nd  and 3rd tracheal rings. The endotracheal tube was withdrawn until the tip was just above our tracheotomy. The tracheostomy tube and obturator were advanced into the trachea.  The obturator was exchanged for the inner cannula.  The vent circuit was connected to the trach tube and end-tidal CO2 was detected confirming adequate ventilation.  #0 Prolene was used to secure the flange to the skin at four points.   Surgicel was placed into the wound.  Any remaining bleeding points were controlled with silver nitrate.  A soft velcro trach tie was placed.  All sponge, needle, and instrument counts were correct at the conclusion of the case.      Dorise Hiss, MD  07/15/2018, 15:15      I was present for all key and/or critical portions of the case and immediately available at all times.      Ann Held, MD 07/15/2018, 15:30

## 2018-07-15 NOTE — Care Plan (Signed)
Pt currently on vent settings of +5/8 PS/30%. SBT performed (see previous note). Will continue to monitor

## 2018-07-15 NOTE — Anesthesia Preprocedure Evaluation (Signed)
ANESTHESIA PRE-OP EVALUATION  Planned Procedure: TRACHEOSTOMY (N/A )  GASTROSCOPY WITH PLACEMENT PEG TUBE (N/A Abdomen)  Review of Systems     anesthesia history negative     patient summary reviewed          Pulmonary   Hx of smoking,   Cardiovascular    Hypertension        GI/Hepatic/Renal        Endo/Other          Neuro/Psych/MS    anxiety     Cancer                     Physical Assessment      Patient summary reviewed   Airway                 Endotracheal tube present      Dental                    Pulmonary    Comment: Hx of smoking  Breath sounds clear to auscultation  (-) no rhonchi     Cardiovascular    Rhythm: regular  Rate: Normal  (-) no murmur     Other findings            Plan  Planned anesthesia type: general    plan to administer opioids postoperatively    ASA 3     Intravenous and Inhalational induction     Anesthetic plan and risks discussed with healthcare power of attorney.     Anesthesia issues/risks discussed are: Dental Injuries, PONV, Post-op Cognitive Dysfunction, Central Line Placement, Stroke, Blood Loss, Nerve Injuries, Post-op Intubation/Ventilation, Post-op Agitation/Tantrum, Intraoperative Awareness/ Recall, Difficult Airway, Post-op Pain Management, Cardiac Events/MI, Aspiration and Sore Throat.    Use of blood products discussed with healthcare power of attorney who consented to blood products.     Patient's NPO status is appropriate for Anesthesia.           Plan discussed with CRNA.

## 2018-07-15 NOTE — Consults (Signed)
[facility]  Trauma Surgery Consult  Follow Up    [name], [name], 57 y.o. female  Date of Service: 07/15/2018  Date of Birth:  1960/11/16    Hospital Day:  LOS: 10 days     Chief Complaint:  Inanition, respiratory failure due to mental status    Subjective: intubated, minimal ventilatory settings, no distress, localizing, GCS 7T    Objective:  Temperature: 37.9 C (100.2 F)  Heart Rate: 95  BP (Non-Invasive): 117/74  Respiratory Rate: (!) 22  SpO2: 97 %  Pain Score (Numeric, Faces): 3  General: appears chronically ill and no distress, intubated  Eyes: Conjunctiva clear., Sclera non-icteric.   HENT:Head atraumatic and normocephalic  Neck: No JVD, trachea midline  Lungs: on vent  Cardiovascular: regular rate and rhythm  Abdomen: Soft, non-tender, non-distended, midline scar  Extremities: No cyanosis or edema  Skin: Skin warm and dry  Neurologic: GCS 7T, localizing, not alert or opening her eyes    Labs:    BMP:     Recent Labs     07/15/18  0027   SODIUM 139   POTASSIUM 3.7   CHLORIDE 105   CO2 28   BUN 19   CREATININE 0.57   GLUCOSENF 110   ANIONGAP 6   BUNCRRATIO 33*   GFR >60   CALCIUM 8.5     CBC Results Differential Results   Recent Labs     07/15/18  0745   WBC 11.3*   HGB 8.0*   HCT 24.7*   PLTCNT 298    No results found for this or any previous visit (from the past 30 hour(s)).       Imaging Studies:    CT CAP 06/08/2018 reviewed with adequate percutaneous window to the stomach, small hiatal hernia    Assessment/Recommendations:  Patient is a 57 yo woman with L meningioma s/p left craniotomy complicated by intracranial hemorrhage and no improvement in mental status. Respiratory failure and inanition. Trauma consult for tracheostomy and PEG.    - Hgb 6.8 s/p 1 unit PRBC with Hgb now 8  - plan for tracheostomy and PEG today  - NPO including tube feeds since midnight  - consent obtained from the MPOA(Daughter) and placed in the chart    Dorise Hiss, MD      I saw and examined the patient.  I  reviewed the resident's note.  I agree with the findings and plan of care as documented in the resident's note.  Any exceptions/additions are edited/noted.    Ann Held, MD

## 2018-07-15 NOTE — Progress Notes (Signed)
[facility]  NEUROSURGERY   PROGRESS NOTE    Ormond, [name], 57 y.o. female  Date of Admission:  07/05/2018  Date of Service: 07/16/2018  Date of Birth:  February 06, 1961     Chief Complaint: right upper and lower extremity weakness    Subjective:   NAEO    Vital Signs:  Temp (24hrs) Max:38 C (308.6 F)      Systolic (57QIO), NGE:952 , Min:96 , WUX:324     Diastolic (40NUU), VOZ:36, Min:38, Max:89    Temp  Avg: 37.6 C (99.7 F)  Min: 37 C (98.6 F)  Max: 38 C (100.4 F)  MAP (Non-Invasive)  Avg: 86.7 mmHG  Min: 50 mmHG  Max: 106 mmHG  Pulse  Avg: 93.9  Min: 80  Max: 109  Resp  Avg: 18.9  Min: 14  Max: 27  SpO2  Avg: 97.1 %  Min: 93 %  Max: 100 %  Pain Score (Numeric, Faces): 3    Min/Max/Avg ICP/CPP last 24hrs:   No data recorded    Today's Physical Exam:    Appears acutely ill, intubated  GCS 1 5 1  T  -- Eyes do not open to noxious stimuli  -- Localizes in LUE, Withdraws in RUE and BLE  PERRL  Face symmetric  +cough    Unable to assess hearing  Unable to assess speech  Unable to assess fund of knowledge  Unable to assess attention span & concentration  Unable to assess recent and remote memory  CN 3 4 6  EOMI Unable to assess  CN 11 shrug symmetric Unable to assess  Muscle Strength Unable to assess  Unable to assess drift    -- L cranial staples c/d/i  -- JP drain Rapide  -- LD Rapide    Assessment/Plan:  [name] is a 57 yo F PMH HTN, HLD w/ 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 POD11/11  -- FU video EEG   -- Keppra 1500 mg BID (no end date)   -- Vimpat 100 mg BID   -- Dilantin 100 mg TID   -- Will need outpatient neurology follow-up  -- Please bathe patient daily and clean wound daily  -- Okay for soap/shampoo  -- Apply bacitracin ointment BID, cleaning old ointment off  before application of new ointment  -- Final pathology: WHO grade I meningioma  -- Amantadine started for neuro-stimulation   -- Amantadine 100 mg 2x/day for 2 weeks. At week 3, consider increasing to 150 mg 2x/day for 1 weeks. At week 4, consider increasing to 200 mg 2x/day if no improvement in condition.   -- Based on dosing regimen in NEJM. 2012 Mar 1;366(9):819-26.     -- Imaging:   -- Duplex BUE/BLE: ORDERED    -- TTE (07/12/18): EF 75%, no regional wall abnormalities   -- CT brain w/o (07/11/18): stable    -- CT brain w/o (07/09/18 @ 1328):stable   -- CT brain w/o (07/09/18 @ 0430):stable   -- CT brain w/o (07/08/18):postop changes from decompressive craniectomy with interval mild extracalvarial brain herniation through the craniectomy site; mild increase in cerebral edema at the tumor resection bed and adjacent parenchyma without significant increase in hemorrhage   -- MRI brain w/wo (07/06/18):gross total resection of a left medial tentorial meningioma; hemorrhagic productswithin the resection cavity extending into the parenchyma of the left thalamus and  tracking down within the left midbrain and extending to the fourth ventricle   -- CT brain w/o (07/06/18): postop changes from left decompressive craniectomy for evacuation of hematoma of tumor bed with residual hemorrhage and improved edema and rightward midline shift  -- CT brain w/o (07/06/18): postop changes from decompressive craniectomy for evacuation of tumor bed hematoma with minimal residual blood at the surgical site   -- CT brain w/o (07/05/18): postop changes with development of hematoma at tumor bed and 8 mm of midline shift  -- Pain/spasm control: Tylenol PRN, Fentanyl PRN  -- Diet: NPO + Osmolite TF + FWF 200 cc q6h  -- Bowel regimen, last BM 07/15/18  -- Abx: none   -- Blood Cx (12/20): NGTD   -- CDiff (12/23): negative   -- Urine Cx (12/23): in process  -- Activity: up in chair with assist   -- Craniectomy precautions,  helmet to be worn at all times when OOB  -- PT/OT: AR vs SNF  -- DVT ppx: Lovenox, SCDs/Venodynes  -- Consults:    NCCU/Neurology   -- Keppra 750 mg BID   -- Will need outpatient neurology follow-up for AED management   Trauma   -- Trach/PEG completed 12/23  -- Lines/Drains: Midline, Trach/PEG, Foley catheter  -- Wound: L fronto-temporal staples, JP drain Rapide, LD Rapide, helmet  -- Disposition: ongoing    Leta Baptist, MD  PGY-3 Neurosurgery  07/16/2018     Late entry for 07/16/18. I saw and examined the patient.  I reviewed the resident's note.  I agree with the findings and plan of care as documented in the resident's note.  Any exceptions/additions are edited/noted.    Lennie Muckle, MD

## 2018-07-15 NOTE — Care Plan (Signed)
[name] VS/I&O assessments. Neuro assessments per MD order. GCS 1-5-1. Left upper extremity in restraint. Pt tolerated well. Turned q2h. NAD. VSS.    Patient Goals Based on Modifiable Risk Factors    The following modifiable risk factors were discussed with patient:     Hypertension    Goal:   Maintain blood pressure less than 140/90 (or less than 130/90 for patients with diabetes or chronic kidney disease).    Patient's selected lifestyle modification(s):  Routinely take antihypertensive medications as prescribed      Hypercholesteremia    Goal(s):  Normal total less than 200    Patient's selected lifestyle modification(s):  Follow a low fat/low cholesterol diet      Diabetes Mellitus    Goal:   HbA1C less than 7 percent    Patient's selected lifestyle modification(s):  Adhere to recommended diet modification      Cigarette Smoking    Goal(s):  Avoid Second Hand Smoke    Patient's selected lifestyle modification(s):  Stop smoking with support of education on skills to quit      Unhealthy Diet    Goal:   Maintain a healthy balanced diet.    Patient's selected lifestyle modification(s):  Eat a diet high in fresh fruit, vegetables, low-fat dairy products, fiber, whole grains and protein from plant sources and low in saturated fat, cholesterol and sodium    Patient/Family/Caregiver response to plan:    Patient unable to participate in POC/education.       Problem: Adult Inpatient Plan of Care  Goal: Plan of Care Review  Outcome: Ongoing (see interventions/notes)  Flowsheets (Taken 07/15/2018 0527)  Plan of Care Reviewed With: patient; caregiver; daughter  Goal: Patient-Specific Goal (Individualization)  Outcome: Ongoing (see interventions/notes)  Flowsheets (Taken 07/15/2018 0527)  Patient-Specific Goals (Include Timeframe): peg and trach today  Goal: Absence of Hospital-Acquired Illness or Injury  Outcome: Ongoing (see interventions/notes)  Goal: Optimal Comfort and Wellbeing  Outcome: Ongoing (see  interventions/notes)  Goal: Rounds/Family Conference  Outcome: Ongoing (see interventions/notes)

## 2018-07-16 ENCOUNTER — Inpatient Hospital Stay (HOSPITAL_COMMUNITY): Payer: PPO

## 2018-07-16 DIAGNOSIS — I82452 Acute embolism and thrombosis of left peroneal vein: Secondary | ICD-10-CM

## 2018-07-16 DIAGNOSIS — I82613 Acute embolism and thrombosis of superficial veins of upper extremity, bilateral: Secondary | ICD-10-CM

## 2018-07-16 LAB — BPAM PACKED CELL ORDER
UNIT DIVISION: 0
UNIT DIVISION: 0

## 2018-07-16 LAB — BASIC METABOLIC PANEL
ANION GAP: 7 mmol/L (ref 4–13)
BUN/CREA RATIO: 31 — ABNORMAL HIGH (ref 6–22)
BUN: 18 mg/dL (ref 8–25)
CALCIUM: 8.3 mg/dL — ABNORMAL LOW (ref 8.5–10.2)
CHLORIDE: 106 mmol/L (ref 96–111)
CO2 TOTAL: 25 mmol/L (ref 22–32)
CREATININE: 0.59 mg/dL (ref 0.49–1.10)
ESTIMATED GFR: >60 mL/min/{1.73_m2} (ref >60)
GLUCOSE: 121 mg/dL (ref 65–139)
POTASSIUM: 4.1 mmol/L (ref 3.5–5.1)
SODIUM: 138 mmol/L (ref 136–145)

## 2018-07-16 LAB — TYPE AND CROSS RED CELLS - UNITS
ABO/RH(D): O POS
ANTIBODY SCREEN: NEGATIVE
UNITS ORDERED: 2

## 2018-07-16 LAB — POC BLOOD GLUCOSE (RESULTS)
GLUCOSE, POC: 103 mg/dL (ref 70–105)
GLUCOSE, POC: 119 mg/dL — ABNORMAL HIGH (ref 70–105)
GLUCOSE, POC: 126 mg/dL — ABNORMAL HIGH (ref 70–105)
GLUCOSE, POC: 129 mg/dL — ABNORMAL HIGH (ref 70–105)
GLUCOSE, POC: 150 mg/dL — ABNORMAL HIGH (ref 70–105)
GLUCOSE, POC: 193 mg/dL — ABNORMAL HIGH (ref 70–105)

## 2018-07-16 LAB — CBC
HCT: 23.1 % — ABNORMAL LOW (ref 34.8–46.0)
HGB: 7.3 g/dL — ABNORMAL LOW (ref 11.5–16.0)
MCH: 28.6 pg (ref 26.0–32.0)
MCHC: 31.6 g/dL (ref 31.0–35.5)
MCV: 90.6 fL (ref 78.0–100.0)
MPV: 10 fL (ref 8.7–12.5)
PLATELETS: 409 x10ˆ3/uL — ABNORMAL HIGH (ref 150–400)
RBC: 2.55 x10ˆ6/uL — ABNORMAL LOW (ref 3.85–5.22)
RDW-CV: 15.1 % (ref 11.5–15.5)
WBC: 13.3 x10ˆ3/uL — ABNORMAL HIGH (ref 3.7–11.0)

## 2018-07-16 LAB — PHOSPHORUS: PHOSPHORUS: 3.8 mg/dL (ref 2.4–4.7)

## 2018-07-16 LAB — URINALYSIS, MICROSCOPIC
RBCS: 9 /HPF — ABNORMAL HIGH (ref <6.0)
WBCS: 58 /HPF — ABNORMAL HIGH (ref <11.0)
WBCS: 58 /hpf — ABNORMAL HIGH (ref <11.0)

## 2018-07-16 LAB — GAMMA GT: GGT: 38 U/L (ref 7–50)

## 2018-07-16 LAB — PHENYTOIN, FREE
FREE PHENYTOIN LEVEL: 1.1 ug/mL (ref 1.0–2.0)
FREE PHENYTOIN LEVEL: 1.7 ug/mL (ref 1.0–2.0)

## 2018-07-16 LAB — URINALYSIS, MACROSCOPIC
BILIRUBIN: NEGATIVE mg/dL
BLOOD: NEGATIVE mg/dL
BLOOD: NEGATIVE mg/dL
COLOR: NORMAL
GLUCOSE: NEGATIVE mg/dL
KETONES: NEGATIVE mg/dL
NITRITE: NEGATIVE
PH: 8 (ref 5.0–8.0)
PROTEIN: NEGATIVE mg/dL
SPECIFIC GRAVITY: 1.016 (ref 1.005–1.030)
UROBILINOGEN: 4 mg/dL — AB

## 2018-07-16 LAB — BUN: BUN: 17 mg/dL (ref 8–25)

## 2018-07-16 LAB — TOTAL PROTEIN: PROTEIN TOTAL: 6.3 g/dL — ABNORMAL LOW (ref 6.4–8.3)

## 2018-07-16 LAB — URINE CULTURE: URINE CULTURE: >100000 — AB

## 2018-07-16 LAB — MAGNESIUM: MAGNESIUM: 2.2 mg/dL (ref 1.6–2.6)

## 2018-07-16 LAB — AST (SGOT): AST (SGOT): 38 U/L (ref 8–41)

## 2018-07-16 LAB — ALK PHOS (ALKALINE PHOSPHATASE): ALKALINE PHOSPHATASE: 66 U/L (ref 50–130)

## 2018-07-16 LAB — BILIRUBIN TOTAL: BILIRUBIN TOTAL: 0.5 mg/dL (ref 0.3–1.3)

## 2018-07-16 LAB — ADULT ROUTINE BLOOD CULTURE, SET OF 2 BOTTLES (BACTERIA AND YEAST): BLOOD CULTURE, ROUTINE: NO GROWTH

## 2018-07-16 LAB — ALT (SGPT): ALT (SGPT): 48 U/L (ref <55)

## 2018-07-16 MED ORDER — LORAZEPAM 2 MG/ML INJECTION SOLUTION
2.0000 mg | INTRAMUSCULAR | Status: AC
Start: 2018-07-16 — End: 2018-07-16
  Administered 2018-07-16: 2 mg via INTRAVENOUS
  Filled 2018-07-16: qty 1

## 2018-07-16 MED ORDER — CLOBAZAM 2.5 MG/ML ORAL SUSPENSION
5.00 mg | Freq: Two times a day (BID) | ORAL | Status: DC
Start: 2018-07-16 — End: 2018-07-16

## 2018-07-16 MED ORDER — LORAZEPAM 2 MG/ML INJECTION SOLUTION
2.0000 mg | INTRAMUSCULAR | Status: AC
Start: 2018-07-16 — End: 2018-07-16
  Administered 2018-07-16 (×2): 2 mg via INTRAVENOUS
  Filled 2018-07-16: qty 1

## 2018-07-16 MED ORDER — LACOSAMIDE 200 MG/20 ML INTRAVENOUS SOLUTION
200.00 mg | Freq: Two times a day (BID) | INTRAVENOUS | Status: DC
Start: 2018-07-16 — End: 2018-07-18
  Administered 2018-07-16 – 2018-07-17 (×4): 200 mg via INTRAVENOUS
  Filled 2018-07-16 (×5): qty 20

## 2018-07-16 MED ORDER — FOSPHENYTOIN 500 MG PE/10 ML INJECTION SOLUTION
10.0000 mg/kg | Freq: Once | INTRAVENOUS | Status: AC
Start: 2018-07-16 — End: 2018-07-16
  Administered 2018-07-16: 0 mg via INTRAVENOUS
  Administered 2018-07-16: 905 mg via INTRAVENOUS
  Filled 2018-07-16: qty 18

## 2018-07-16 MED ORDER — ELECTROLYTE-A INTRAVENOUS SOLUTION BOLUS
1000.0000 mL | Freq: Once | INTRAVENOUS | Status: AC
Start: 2018-07-16 — End: 2018-07-16
  Administered 2018-07-16: 1000 mL via INTRAVENOUS
  Administered 2018-07-16: 0 mL via INTRAVENOUS

## 2018-07-16 MED ORDER — CLOBAZAM 2.5 MG/ML ORAL SUSPENSION
5.00 mg | Freq: Two times a day (BID) | ORAL | Status: DC
Start: 2018-07-16 — End: 2018-07-17
  Administered 2018-07-16: 5 mg via INTRADUODENAL
  Filled 2018-07-16: qty 2

## 2018-07-16 MED ADMIN — cloBAZam 2.5 mg/mL oral suspension: INTRADUODENAL | @ 17:00:00

## 2018-07-16 MED ADMIN — lactated Ringers intravenous solution: ORAL | @ 17:00:00 | NDC 00264775000

## 2018-07-16 MED ADMIN — banana flakes-transgalactooligosaccharide oral powder packet: GASTROSTOMY | @ 22:00:00

## 2018-07-16 MED ADMIN — electrolyte-A intravenous solution: TOPICAL | @ 21:00:00 | NDC 00338022104

## 2018-07-16 MED ADMIN — nystatin 100,000 unit/gram topical cream: GASTROSTOMY | @ 21:00:00 | NDC 00168005415

## 2018-07-16 MED ADMIN — tiotropium device: GASTROSTOMY | @ 21:00:00 | NDC 09991000741

## 2018-07-16 NOTE — Care Plan (Signed)
Pt on 28% ATC for most of the day.  Pt placed on ventilator to rest due to increased WOB.  PSV 8/5 30%.  Will continue to monitor respiratory status.

## 2018-07-16 NOTE — Progress Notes (Signed)
[facility]  NEUROSURGERY   PROGRESS NOTE    [name], [name], 57 y.o. female  Date of Admission:  07/05/2018  Date of Service: 07/17/2018  Date of Birth:  [birth year]     Chief Complaint: right upper and lower extremity weakness    Subjective:   Multiple seizure events yesterday and overnight, requiring addition of Onfi and Versed drip.    Vital Signs:  Temp (24hrs) Max:39.5 C (622.2 F)      Systolic (97LGX), QJJ:941 , Min:100 , DEY:814     Diastolic (48JEH), UDJ:49, Min:60, Max:90    Temp  Avg: 38.2 C (100.8 F)  Min: 37.4 C (99.3 F)  Max: 39.5 C (103.1 F)  MAP (Non-Invasive)  Avg: 88.9 mmHG  Min: 72 mmHG  Max: 106 mmHG  Pulse  Avg: 103.2  Min: 95  Max: 116  Resp  Avg: 22.7  Min: 10  Max: 37  SpO2  Avg: 96.3 %  Min: 90 %  Max: 100 %  Pain Score (Numeric, Faces): 3    Min/Max/Avg ICP/CPP last 24hrs:   No data recorded    Today's Physical Exam:    Appears acutely ill, intubated  GCS 1 5 1  T  -- Eyes do not open to noxious stimuli  -- Localizes in LUE, Withdraws in RUE and BLE  PERRL  Face symmetric  +cough    Unable to assess hearing  Unable to assess speech  Unable to assess fund of knowledge  Unable to assess attention span & concentration  Unable to assess recent and remote memory  CN 3 4 6  EOMI Unable to assess  CN 11 shrug symmetric Unable to assess  Muscle Strength Unable to assess  Unable to assess drift    -- L cranial staples c/d/i  -- JP drain Rapide  -- LD Rapide    Assessment/Plan:  [name] is a 56 yo F PMH HTN, HLD w/ 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 POD12/12.  -- FU video EEG, two events overnight on 07/16/18   -- Versed gtt   -- Keppra 1500 mg 2x/day   -- Vimpat 200 mg 2x/day   -- Dilantin 100 mg q8h   -- Clobazam 5 mg 2x/day   -- Will need outpatient neurology  follow-up  -- Please bathe patient daily and clean wound daily  -- Okay for soap/shampoo  -- Apply bacitracin ointment BID, cleaning old ointment off before application of new ointment  -- Final pathology: WHO grade I meningioma    -- Imaging:   -- Venous duplex LLE (07/23/18): ordered   -- Venous duplex BLE (07/16/18): left peroneal (calf vein) DVT   -- Venous duplex BUE (07/16/18): no evidence of DVT   -- TTE (07/12/18): EF 75%, no regional wall abnormalities   -- CT brain w/o (07/11/18): stable    -- CT brain w/o (07/09/18 @ 1328):stable   -- CT brain w/o (07/09/18 @ 0430):stable   -- CT brain w/o (07/08/18):postop changes from decompressive craniectomy with interval mild extracalvarial brain herniation through the craniectomy site; mild increase in cerebral edema at the tumor resection bed and adjacent parenchyma without significant increase in hemorrhage   -- MRI brain w/wo (07/06/18):gross total resection of a left medial tentorial meningioma; hemorrhagic productswithin the resection cavity extending into the parenchyma of the left thalamus and tracking down within the left  midbrain and extending to the fourth ventricle   -- CT brain w/o (07/06/18): postop changes from left decompressive craniectomy for evacuation of hematoma of tumor bed with residual hemorrhage and improved edema and rightward midline shift  -- CT brain w/o (07/06/18): postop changes from decompressive craniectomy for evacuation of tumor bed hematoma with minimal residual blood at the surgical site   -- CT brain w/o (07/05/18): postop changes with development of hematoma at tumor bed and 8 mm of midline shift  -- Pain/spasm control: Tylenol PRN, Fentanyl PRN  -- Diet: NPO + Osmolite TF + FWF 200 cc q6h  -- Bowel regimen, last BM 07/16/18  -- Abx: none   -- Blood Cx (07/12/18): negative   -- CDiff (07/15/18): negative   -- Urine Cx (07/16/18): in process  -- Activity: up in chair with assist   --  Craniectomy precautions, helmet to be worn at all times when OOB  -- PT/OT: SNF vs LTAC  -- DVT ppx: Lovenox, SCDs/Venodynes  -- Consults:    NCCU/Neurology   -- Will need outpatient neurology follow-up for AED management   Trauma   -- Trach/PEG completed 07/15/18  -- Lines/Drains: Midline, Trach/PEG, Foley catheter  -- Wound: L fronto-temporal staples, JP drain Rapide, LD Rapide, helmet  -- Disposition: ongoing    [name], M.D.  PGY-4 Neurosurgery  07/17/2018, 06:46     I saw and examined the patient.  I reviewed the resident's note.  I agree with the findings and plan of care as documented in the resident's note.  Any exceptions/additions are edited/noted.    [name] Muckle, MD

## 2018-07-16 NOTE — Care Plan (Signed)
[facility]  Physical Therapy Treatment    Patient Name: [name]  Date of Birth: 06-24-1961  Height: Height: 167.6 cm (5' 5.98")  Weight: Weight: 90.5 kg (199 lb 8.3 oz)  Room/Bed: 11/A  Payor: HEALTH PLAN / Plan: HEALTH PLAN (PPO-NON ST EMP) / Product Type: PPO /       Assessment:      (P) Ms Ouk continues to demonstrate no eye opening and minimal following of any commands, increasing tone all 4 limbs but did demonstrate some postural awareness in terms of appropriate neck and trunk responses to being brought up to sitting EOB. Continue to progress as medical status and cognition allows     Discharge Needs:    Equipment Recommendation: (P) TBD   Discharge Disposition: (P) TBD, inpatient rehabilitation facility, skilled nursing facility    JUSTIFICATION OF DISCHARGE RECOMMENDATION   Based on current diagnosis, functional performance prior to admission, and current functional performance, this patient requires continued PT services in (P) TBD, inpatient rehabilitation facility, skilled nursing facility in order to achieve significant functional improvements in these deficit areas: (P) aerobic capacity/endurance, arousal, attention, and cognition, gait, locomotion, and balance, neuromuscular, ventilation and respiration/gas exchange.    Plan:   Current Intervention:    To provide physical therapy services (P) minimum of 2x/week  for duration of (P) until discharge.    The risks/benefits of therapy have been discussed with the patient/caregiver and he/she is in agreement with the established plan of care.       Past Medical History:   Diagnosis Date   . Anxiety    . Arthropathy    . Cancer (CMS HCC)     brain tumor   . Dyspnea on exertion    . Esophageal reflux     does not take meds   . Heart murmur     benign   . HTN (hypertension)    . Hyperlipemia    . Muscle weakness     right sided weakness   . Palpitations    . Shortness of breath    . Wears glasses      reading         Past Surgical History:   Procedure Laterality Date   . HX APPENDECTOMY     . HX BREAST AUGMENTATION Bilateral    . HX LAP CHOLECYSTECTOMY     . HX LUMBAR DISKECTOMY  1999   . HX OTHER Right     benign tumor removed from upper right leg x3   . HX OTHER      exploratory lap   . HX TUBAL LIGATION            reports that she has been smoking cigarettes. She has a 30.00 pack-year smoking history. She has never used smokeless tobacco. She reports that she does not drink alcohol or use drugs.  Social History     Tobacco Use   Smoking Status Current Every Day Smoker   . Packs/day: 1.00   . Years: 30.00   . Pack years: 30.00   . Types: Cigarettes   Smokeless Tobacco Never Used   Tobacco Comment    down to 8 cigarettes, counseled on [email or web address]-[email or web address]_NOW         Subjective & Objective        07/16/18 1125   Therapist Pager   PT Assigned/ Pager # [phone]   Rehab Session   Document Type therapy progress note (daily note)   Total  PT Minutes: 85   Patient Effort good   Symptoms Noted During/After Treatment fatigue   General Information   Patient Profile Reviewed? yes   Onset of Illness/Injury or Date of Surgery 07/05/18   Medical Lines Foley Catheter;PEG Tube;PIV Line;Telemetry   Respiratory Status aerosol trach collar   Existing Precautions/Restrictions aspiration precautions;fall precautions;full code   Mutuality/Individual Preferences   Individualized Care Needs Maxi Move and lateral supports/ recline for up in chair   Patient-Specific Goals (Include Timeframe) Family wanting her to open eyes and be able to see, hopeful for continued gains re cognition and movement   Self-Care   Dominant Hand left   Usual Activity Tolerance good   Current Activity Tolerance fair   Equipment Currently Used at Home yes  (only recently started use of R ankle aircast, cane)   Pre Treatment Status   Pre Treatment Patient Status Patient supine in bed;Venodynes in place and activated;Nurse approved session;Sitter select activated   Support  Present Pre Treatment  Family present   Cognitive Assessment/Interventions   Behavior/Mood Observations flat affect;lethargic   Orientation Status unable/difficult to assess   Attention severe impairment   Follows Commands other (see comments)   Comment Did follow with LLE by pushing down against resistance to " try to stand on your left leg" , holding extension of knee until told to let go and to " sit down", repeated sequence second time, but not responding to any other requests including for limb and tongue movements   Vital Signs   Pre-Treatment Heart Rate (beats/min) 101   Post-treatment Heart Rate (beats/min) 109   Pre-Treatment Resp Rate (breaths/min) 19   Post-treatment Resp Rate (breaths/min) 27   Pre Treatment BP 120/73  (118/81 long sitting with HOB at 60)   Post Treatment BP 123/81   Pre SpO2 (%) 98   O2 Delivery Pre Treatment supplemental O2   Post SpO2 (%) 97   O2 Delivery Post Treatment supplemental O2   Pain Assessment   Pre/Post Treatment Pain Comment No behaviors suggested pain   Vision Assessment/Interventions   Visual Impairment/Limitations   (not open eyes; no tracking or visual fixation w/lids held)   RUE Assessment   RUE Other Significantly increased flexor tone as compared to last session; no active movement   LUE Assessment   LUE Assessment X-Exceptions   LUE Other Significantly increased flexor tone as compared to last session; RN notes patient continues to spontaneously move the limb but this was not noted during this session   RLE Assessment   RLE Other Mildly increased extensor tone , mostly at hip and ankle can still be DF to 0 with little resistance   LLE Assessment   LLE Assessment X-Exceptions   LLE Tone mildly increased tone  (extensor)   LLE Other RN notes patient continues to spontaneously move the limb but this was not noted during this session   Bed Mobility Assessment/Treatment   Bed Mobility, Assistive Device draw sheet;Head of Bed Elevated   Supine-Sit Independence dependent  (less than 25% patient effort)   Sit to Supine, Independence dependent (less than 25% patient effort)   Safety Issues cognitive deficits limit understanding;decreased use of arms for pushing/pulling;decreased use of legs for bridging/pushing;impaired trunk control for bed mobility   Impairments balance impaired;cognition impaired;endurance;postural control impaired;muscle tone abnormal;motor control impaired;vision impaired;strength decreased   Scoot/Bridge Independence 2 person assist required;dependent (less than 25% patient effort)   Balance Skill Training   Sitting Balance: Static poor + balance   Sitting, Dynamic (  Balance) unable to balance   Systems Impairment Contributing to Balance Disturbance cognitive;neuromuscular;musculoskeletal;visual   Therapeutic Exercise/Activity   Comment PROM BLEs and attempted to provoke active LE extension or hip rotations in hooklying, with patient breifly following request to push down into mass extension LLE and then release. Patient was maintained in upright sitting EOB with anterior/ posterior moderate manual assist to trunk extension once established, and intermittent max assist head control, verbal cuing with patient then keeping head close to level for brief intervals. Total seated time ~12 min . She demonstrated continued active attempt to extend neck and spine into upright trunk and eyes level position ( re anteior/posterior orientation) althouugh we were unable to reduce the L lateral flexion of the neck    Post Treatment Status   Post Treatment Patient Status Patient supine in bed;Call light within reach;Venodynes in place and activated   Support Present Post Treatment  Family present   Communication [place] With patient;daughter  (2 daughters at bedside)   Basic Mobility Am-PAC/6Clicks Score   Turning in bed without bedrails 1   Lying on back to sitting on edge of flat bed 1   Moving to and from a bed to a  chair 1   Standing up from chair 1   Walk in room 1   Climbing 3-5 steps with railing 1   6 Clicks Raw Score total 6   Standardized (t-scale) score 16.59   CMS 0-100% Score 100   CMS Modifier CN   Physical Therapy Clinical Impression   Assessment Ms Weger continues to demonstrate no eye opening and minimal following of any commands, increasing tone all 4 limbs but did demonstrate some postural awareness in terms of appropriate neck and trunk responses to being brought up to sitting EOB. Continue to progress as medical status and cognition allows    Criteria for Skilled Therapeutic meets criteria   Pathology/Pathophysiology Noted neuromuscular;other (see comments)  (vision, somatosensory, cognition)   Impairments Found (describe specific impairments) aerobic capacity/endurance;arousal, attention, and cognition;gait, locomotion, and balance;neuromuscular;ventilation and respiration/gas exchange   Functional Limitations in Following  self-care;home management;work;community/leisure   Disability: Inability to Perform work;community/leisure   Rehab Potential fair, will monitor progress closely   Therapy Frequency minimum of 2x/week   Predicted Duration of Therapy Intervention (days/wks) until discharge   Anticipated Equipment Needs at Discharge (PT) TBD   Anticipated Discharge Disposition TBD;inpatient rehabilitation facility;skilled nursing facility     Therapist:   Cherlyn Labella, PT   Pager #: [phone]

## 2018-07-16 NOTE — Care Plan (Addendum)
Medical Nutrition Therapy Assessment        SUBJECTIVE : Pt remains in NCCU with vent support, now s/p trach/PEG placement, tube feeds have resumed, per nursing staff pt is tolerating tube feeds without any current concerns.     OBJECTIVE:     Current Diet Order/Nutrition Support:    ADULT TUBE FEED - BOLUS Q4H WHILE AWAKE; OSMOLITE 1.5; NG; 1 can 5 times daily; Flush Tube: 200 ml;  Q6H + 1 packet of PROSource BID      Height Used for Calculations: 167.6 cm (5' 5.98")  Weight Used For Calculations: 78.2 kg (172 lb 6.4 oz)(standing weight upon admission)  BMI (kg/m2): 27.9  BMI Assessment: BMI 25-29.9: overweight  Ideal Body Weight (IBW) (kg): 59.54  % Ideal Body Weight: 131.34  Adjusted/Standard Body Weight  Adjusted BW: 64 kg            Estimated Needs:    Energy Calorie Requirements: 2542-7062 cal/kg(25-30 cal/78.2kg BW)   Protein Requirements (gms/day): 90-120 g/pro/day(1.5-2 g/pro/59 kg IBW)  Fluid Requirements: 3762-8315 ml/day(1 ml/cal)       Plan/Interventions :   1. Continue tube feeds with Osmolite 1.5, bolus 1 can 5 times daily + 1 packet of PROSource BID to provide:        1920cal = 25cals/kg  105g protein = 1.8g/kg  969ml free water + 200 mL Q6H = 1714 mL  2. Monitor I/O's for tube feed tolerance, provide free water flushes PRN  3. Monitor daily weight trends  4. Continue banana flakes until diarrhea improves  5. Continue 500 mg Vitamin C BID  6. Advance diet per SLP rec's as medically appropriate     Nutrition Diagnosis: Inability to swallowrelated to Patient on ventas evidenced by Need for TF    Tobie Lords, PhD, RDLD  07/16/2018, 09:17      Pager # [phone]

## 2018-07-16 NOTE — Care Plan (Signed)
[facility]  Occupational Therapy Initial Evaluation    Patient Name: [name]  Date of Birth: 1961/06/12  Height: Height: 167.6 cm (5' 5.98")  Weight: Weight: 90.5 kg (199 lb 8.3 oz)  Room/Bed: 11/A  Payor: HEALTH PLAN / Plan: HEALTH PLAN (PPO-NON ST EMP) / Product Type: PPO /     Assessment:     Ms. Herne tolerated OT evaluation fairly this date. Patient with no active eye opening, with movement of mouth and reflexive movement of LLE. Patient tolerated bed mobility with DEP A x 2, EOB sitting for ~10+ minutes with Max-DEP A for postural support. Patient with active attempt to hold head up and to support trunk in midline while seated supported on EOB. Patient currently requires DEP A for all bed mobility, transfers, and ADLs. At this time, recommend LTACH vs. SNF setting upon discharge to maximize functional gains. Pending functional progress and arousal, patient may upgrade to IRF placement.      Discharge Needs:     Equipment Recommendation: to be determined    Discharge Disposition: skilled nursing facility, long term acute care facility  The above recommendation is based upon the current examination and evaluation performed on this date. As subsequent sessions are completed, recommendations will be updated accordingly.    JUSTIFICATION OF DISCHARGE RECOMMENDATION   Based on current diagnosis, functional performance prior to admission, and current functional performance, this patient requires continued OT services in skilled nursing facility, long term acute care facility  in order to achieve significant functional improvements.    Plan:   Current Intervention: ADL retraining, IADL retraining, balance training, bed mobility training, cognitive retraining, endurance training, fine motor coordination training, joint mobilization, motor coordination training, neuromuscular re-education, ROM (range of motion), strengthening, stretching, transfer training, vision  retraining    To provide Occupational therapy services 1x/day, minimum of 2x/week, until discharge, until goals are met.       The risks/benefits of therapy have been discussed with the patient/caregiver and he/she is in agreement with the established plan of care.       Subjective & Objective        07/16/18 1124   Therapist Pager   OT Assigned/ Pager # Gearlean Alf [phone]   Rehab Session   Document Type evaluation   Total OT Minutes: 38   Patient Effort good   Symptoms Noted During/After Treatment fatigue   General Information   Patient Profile Reviewed? yes   Onset of Illness/Injury or Date of Surgery 07/05/18   General Observations of Patient Ms. Swarey was lethargic, supine in bed with HOB elevated upon arrival in patient room. Patient's youngest and oldest daughters present at bedside. RN approved session. Patient seen with professional assist of PT for clustered care.   Pertinent History of Current Functional Problem "57 year old female with progressive R sided weakness s/p left meningioma resection s/p Craniectomy for evacuation of tumor bed hematoma."   Medical Lines Foley Catheter;Telemetry;PIV Line   Respiratory Status aerosol trach collar   Existing Precautions/Restrictions aspiration precautions;fall precautions;full code   Pre Treatment Status   Pre Treatment Patient Status Patient supine in bed;Call light within reach;Nurse approved session;Venodynes in place and activated   Support Present Pre Treatment  Family present   Communication Pre Treatment  Nurse   Mutuality/Individual Preferences   Individualized Care Needs OOB via Maxi Move; encourage PROM/stretching of all extremities   Patient-Specific Goals (Include Timeframe) Per family: to improve eye opening/overall function   Living Environment  Lives With parent(s);child(ren), adult   Living Arrangements house   Home Assessment: Stairs in Home   Functional Level Prior   Ambulation 0 - independent   Transferring 0 - independent   Toileting 0 -  independent   Bathing 0 - independent   Dressing 0 - independent   Eating 0 - independent   Communication 0 - understands/communicates without difficulty   Swallowing 0-->swallows foods/liquids without difficulty   Prior Functional Level Comment Patient independent at baseline, working full-time as Therapist, 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   Self-Care   Dominant Hand left   Usual Activity Tolerance good   Current Activity Tolerance fair   Equipment Currently Used at Home no   Equipment Currently Used at Home none   Vital Signs   Pre-Treatment Heart Rate (beats/min) 103   Post-treatment Heart Rate (beats/min) 106   Pre-Treatment Resp Rate (breaths/min) 27   Pre Treatment BP 120/73   Post Treatment BP 123/81   Pre SpO2 (%) 98   O2 Delivery Pre Treatment supplemental O2   Post SpO2 (%) 97   O2 Delivery Post Treatment supplemental O2   Vitals Comment BP in long sitting: 118/81, BP sitting EOB after a few minutes: 123/ 81   Pain Assessment   Pre/Post Treatment Pain Comment Patient with no indication of pain during session   Coping/Psychosocial   Observed Emotional State calm;flat;withdrawn   Coping/Psychosocial Response Interventions   Plan Of Care Reviewed With patient;daughter  (x 2 daughters)   Supportive Measures counseling provided;goal setting facilitated;positive reinforcement provided;problem solving facilitated;self-care encouraged;verbalization of feelings encouraged   Trust Relationship/Rapport care explained;choices provided;emotional support provided;questions encouraged;reassurance provided   Cognitive Assessment/Interventions   Behavior/Mood Observations lethargic;sad/withdrawn;flat affect   Orientation Status unable/difficult to assess   Attention severe impairment;difficulty attending to task/directions   Follows Commands follows one step commands;does not follow one step commands  (one instance of command following with movement of LLE)   Vision Assessment/Interventions   Visual Impairment/Limitations unable/difficult  to assess  (B eye shut throughout session; trace response to R threat)   RUE Assessment   RUE Assessment X- Exceptions   RUE ROM PROM WFL   RUE Strength no active movement noted   RUE Tone moderately increased tone   RUE Coordination significantly impaired   LUE Assessment   LUE Assessment X-Exceptions   LUE ROM PROM WFL   LUE Strength no active movement noted   LUE Tone mildly increased tone   RLE Assessment   RLE Assessment X-Exceptions   RLE ROM PROM WFL   RLE Strength no active movement noted   RLE Tone moderately increased tone   LLE Assessment   LLE Assessment X-Exceptions   LLE ROM PROM WFL   LLE Strength active reflexive response to donning socks and ROM   Mobility Assessment/Training   Additional Documentation Bed Mobility Assessment/Treatment (Group)   Mobility Comment Formal mobility not appropriate to assess at this time.   Bed Mobility Assessment/Treatment   Bed Mobility, Assistive Device draw sheet;Head of Bed Elevated   Supine-Sit Independence dependent (less than 25% patient effort);2 person assist required;verbal cues required   Sit to Supine, Independence dependent (less than 25% patient effort);2 person assist required;verbal cues required   Safety Issues cognitive deficits limit understanding;decreased use of arms for pushing/pulling;decreased use of legs for bridging/pushing;impaired trunk control for bed mobility   Impairments balance impaired;cognition impaired;coordination impaired;endurance;motor control impaired;muscle tone abnormal;postural control impaired;ROM decreased;sensory feedback impaired;strength decreased;vision impaired   Comment Patient tolerated sitting EOB for ~10+ minutes  with Max-DEP A for postural support. Patient with no active eye opening with EOB sitting, with movement of mouth as in attempt to speak at times. Patient's daughters supportive at bedside, encouraged to talk and engage with patient by holding her hand. Patient required Max A for head support as while while  seated EOB.   Scoot/Bridge Independence dependent (less than 25% patient effort);2 person assist required;verbal cues required   Transfer Assessment/Treatment   Sit-Stand Independence not appropriate to assess;not tested   Stand-Sit Independence not appropriate to assess;not tested   Toilet Transfer Independence not tested   Transfer Comment Patient tolerated EOB sitting this date, further mobility deferred until increased arousal/safety.   Bathing Assessment/Training   Comment Anticipate DEP A based on clinical judgement.   Upper Body Dressing Assessment/Training   Comment Anticipate DEP A based on clinical judgement.   Lower Body Dressing Assessment/Training   Position supine   DRESSING ASSESSED Don Socks   ASSISTANCE REQUIRED DONNING Right sock;Left sock   Independence Level  dependent (less than 25% patient effort)   Impairments activity tolerance impaired;balance impaired;cognition impaired;coordination impaired;motor control impaired;muscle tone abnormal;postural control impaired;ROM decreased;sensory feedback impaired;strength decreased;vision impaired   Comment Patient required total assistance to don socks prior to OOB activity.   Toileting Assessment/Training   Comment Patient with no toileting needs during evaluation. Anticipate DEP A based on clinical judgement.   Grooming Assessment/Training   Position supine   Independence Level dependent (less than 25% patient effort)   Impairments activity tolerance impaired;balance impaired;cognition impaired;coordination impaired;motor control impaired;muscle tone abnormal;postural control impaired;ROM decreased;sensory feedback impaired;strength decreased;vision impaired   Comment Patient completed grooming task to wash face using dominant LUE with DEP HOH assist provided by OT.   Self-Feeding Assessment/Training   Comment Anticipate nutritional needs require DEP A at this time.   IADL Evaluation   IADL Comments Anticipate total assistance required for IADL tasks  at this time.   Balance Skill Training   Sitting Balance: Static poor + balance   Sitting, Dynamic (Balance) poor balance   Systems Impairment Contributing to Balance Disturbance cognitive;neuromuscular;musculoskeletal   Identified Impairments Contributing to Balance Disturbance impaired coordination;impaired motor control;abnormal muscle tone;impaired postural control;decreased ROM;decreased strength;impaired sensory feedback   Post Treatment Status   Post Treatment Patient Status Patient supine in bed;Call light within reach;Venodynes in place and activated  (Q turn to R with assistance from bedside RN)   Support Present Post Treatment  Family present;Nurse present   Financial trader Nurse   Care Plan Goals   OT Rehab Goals Occupational Therapy Goal;Bed Mobility Goal;Cognition Goal;Grooming Goal;LB Dressing Goal;Strength Goal;Toileting Goal;Transfer Training Goal;UB Dressing Goal;Range of Motion Goal   Occupational Therapy Goals   OT Goal, Date Established 07/16/18   OT Goal, Time to Achieve by discharge   OT Goal, Activity Type Patient to demonstrate fair- balance or greater during all functional transfers/mobility to increase safety when participating in daily activities   Bed Mobility Goal   Bed Mobility Goal, Date Established 07/16/18   Bed Mobility Goal, Time to Achieve by discharge   Bed Mobility Goal, Activity Type all bed mobility activities   Bed Mobility Goal, Independence Level minimum assist (75% patient effort)   Cognition Goal   Cognition Goal, Date Established 07/16/18   Cognition Goal, Time to Achieve by discharge   Cognition Goal, Activity Type Patient to follow 50% of commands   Grooming Goal   Grooming Goal, Date Established 07/16/18   Grooming Goal, Time to Achieve by discharge   Grooming  Goal, Activity Type all grooming tasks   Grooming Goal, Independence  minimum assist (75% patient effort)   LB Dressing Goal   LB Dressing Goal, Date Established 07/16/18   LB Dressing Goal, Time  to Achieve by discharge   LB Dressing Goal, Activity Type all lower body dressing tasks   LB Dressing Goal, Independence Level moderate assist (50% patient effort)   Range of Motion Goal   Range of Motion Goal, Date Established 07/16/18   Range of Motion Goal, Time to Achieve by discharge   Range of Motion Goal, AROM Measure Patient to tolerate PROM/AAROM to all extremities with 100% success   Strength Goal   Strength Goal, Date Established 07/16/18   Strength Goal, Time to Achieve by discharge   Strength Goal, Measure to Achieve Patient to demonstrate 1/5 strength or greater in BUEs   Toileting Goal   Toileting Goal, Date Established 07/16/18   Toileting Goal, Time to Achieve by discharge   Toileting Goal, Activity Type all toileting tasks   Toileting Goal, Independence Level moderate assist (50% patient effort)   Transfer Training Goal   Transfer Training Goal, Date Established 07/16/18   Transfer Training Goal, Time to Achieve by discharge   Transfer Training Goal, Activity Type all transfers   Transfer Training Goal, Independence Level minimum assist (75% patient effort);moderate assist (50% patient effort)   Transfer Training Goal, Assist Device least restrictrictive assistive device   UB Dressing Goal   UB Dressing  Goal, Date Established 07/16/18   UB Dressing Goal, Time to Achieve by discharge   UB Dressing Goal, Activity Type all upper body dressing tasks   UB Dressing Goal, Independence Level minimum assist (75% patient effort)   Planned Therapy Interventions, OT Eval   Planned Therapy Interventions ADL retraining;IADL retraining;balance training;bed mobility training;cognitive retraining;endurance training;fine motor coordination training;joint mobilization;motor coordination training;neuromuscular re-education;ROM (range of motion);strengthening;stretching;transfer training;vision retraining   Occupational Therapy Clinical Impression   Functional Level at Time of Session Ms. Hata tolerated OT  evaluation fairly this date. Patient with no active eye opening, with movement of mouth and reflexive movement of LLE. Patient tolerated bed mobility with DEP A x 2, EOB sitting for ~10+ minutes with Max-DEP A for postural support. Patient with active attempt to hold head up and to support trunk in midline while seated supported on EOB. Patient currently requires DEP A for all bed mobility, transfers, and ADLs. At this time, recommend LTACH vs. SNF setting upon discharge to maximize functional gains. Pending functional progress and arousal, patient may upgrade to IRF placement.   Criteria for Skilled Therapeutic Interventions Met (OT) yes;meets criteria;skilled treatment is necessary   Rehab Potential good, to achieve stated therapy goals   Therapy Frequency 1x/day;minimum of 2x/week   Predicted Duration of Therapy until discharge;until goals are met   Anticipated Equipment Needs at Discharge to be determined   Anticipated Discharge Disposition skilled nursing facility;long term acute care facility   Evaluation Complexity Justification   Occupational Profile Review Expanded review   Performance Deficits 5+ deficits;Attention;Problem solving;Sequencing;Memory;Safety awareness;Strength;Range of motion;Coordination;Endurance;Balance;Mobility;Vision   Clinical Decision Making High analytic complexity   Evaluation Complexity High       Therapist:   Darcus Pester, OT   Pager #: [phone]

## 2018-07-16 NOTE — Consults (Signed)
[facility]  Trauma Surgery Consult  Follow Up    [name], [name], 57 y.o. female  Date of Service: 07/16/2018  Date of Birth:  1960/09/08    Hospital Day:  LOS: 11 days     Chief Complaint:  Inanition, respiratory failure due to mental status    Subjective: no apparent distress, not following commands or alert    Objective:  Temperature: 37.4 C (99.3 F)  Heart Rate: (!) 101  BP (Non-Invasive): 117/84  Respiratory Rate: 17  SpO2: 90 %  Pain Score (Numeric, Faces): 3  General: appears chronically ill and no distress, intubated  Eyes: Conjunctiva clear., Sclera non-icteric.   HENT:Head atraumatic and normocephalic  Neck: No JVD, trachea midline, tracheostomy intact no bleeding  Lungs: on vent  Cardiovascular: regular rate and rhythm  Abdomen: Soft, non-distended, midline scar, PEG at 3 cm from the skin, no bleeding  Extremities: No cyanosis or edema  Skin: Skin warm and dry  Neurologic: GCS 7T, localizing, not alert or opening her eyes    Labs:    BMP:     Recent Labs     07/16/18  0023   SODIUM 138   POTASSIUM 4.1   CHLORIDE 106   CO2 25   BUN 18   CREATININE 0.59   GLUCOSENF 121   ANIONGAP 7   BUNCRRATIO 31*   GFR >60   CALCIUM 8.3*     CBC Results Differential Results   Recent Labs     07/16/18  0023   WBC 13.3*   HGB 7.3*   HCT 23.1*   PLTCNT 409*    No results found for this or any previous visit (from the past 30 hour(s)).       Imaging Studies:    CT CAP 06/08/2018 reviewed with adequate percutaneous window to the stomach, small hiatal hernia    Assessment/Recommendations:  Patient is a 57 yo woman with L meningioma s/p left craniotomy complicated by intracranial hemorrhage and no improvement in mental status. Respiratory failure and inanition. Trauma consult for tracheostomy and PEG.    - s/p tracheostomy with 6 cuffed shiley, PEG 12/23  - PEG remains at 3 cm at the skin, tracheostomy site has no bleeding, surgicel was removed   - tube feeds as tolerated, no abdominal distension or apparent  issue  - please call with questions or concerns  - tracheostomy skin sutures will be removed 12/31    Dorise Hiss, MD  07/16/2018, 07:01      I saw and examined the patient.  I reviewed the resident's note.  I agree with the findings and plan of care as documented in the resident's note.  Any exceptions/additions are edited/noted.    Ann Held, MD

## 2018-07-16 NOTE — Care Plan (Signed)
[name] VS/I&O assessments. Neuro assessments per MD order. GCS 1-5-1. Turned q2h. Pt tolerated well. NAD. VSS.    Patient Goals Based on Modifiable Risk Factors    The following modifiable risk factors were discussed with patient:     Hypertension    Goal:   Maintain blood pressure less than 140/90 (or less than 130/90 for patients with diabetes or chronic kidney disease).    Patient's selected lifestyle modification(s):  Support weight loss with a diet of increased fruits and vegetables and low-fat dairy products      Hypercholesteremia    Goal(s):  Normal total less than 200    Patient's selected lifestyle modification(s):  Follow a low fat/low cholesterol diet      Diabetes Mellitus    Goal:   HbA1C less than 7 percent    Patient's selected lifestyle modification(s):  Adhere to recommended diet modification      Cigarette Smoking    Goal(s):  Avoid Second Hand Smoke    Patient's selected lifestyle modification(s):  Stop smoking with support of education on skills to quit      Unhealthy Diet    Goal:   Maintain a healthy balanced diet.    Patient's selected lifestyle modification(s):  Meet with a dietician for healthy food sources      Physical Inactivity    Goal:   Increase daily physical activity.    Patient's selected lifestyle modification(s):  Increase daily physical activity to 30-60 minutes    Patient/Family/Caregiver response to plan:    Patient unable to participate in POC/education.     Problem: Adult Inpatient Plan of Care  Goal: Plan of Care Review  Outcome: Ongoing (see interventions/notes)  Flowsheets (Taken 07/16/2018 0452)  Plan of Care Reviewed With: patient; caregiver; family  Goal: Patient-Specific Goal (Individualization)  Outcome: Ongoing (see interventions/notes)  Flowsheets (Taken 07/16/2018 0452)  Anxieties, Fears or Concerns: Per family, "what are we doing about the seizures?"  Patient-Specific Goals (Include Timeframe): Per family, "No more seizure activity."  Goal: Absence of  Hospital-Acquired Illness or Injury  Outcome: Ongoing (see interventions/notes)  Goal: Optimal Comfort and Wellbeing  Outcome: Ongoing (see interventions/notes)  Goal: Rounds/Family Conference  Outcome: Ongoing (see interventions/notes)

## 2018-07-16 NOTE — Progress Notes (Addendum)
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 6 seizures through the night, Increased keppra dose, started vimat and phenytoin. Pt tolerating ATC.        Vital Signs:  Temp (24hrs) Max:37.9 C (846.9 F)      Systolic (62XBM), WUX:324 , Min:96 , MWN:027     Diastolic (25DGU), YQI:34, Min:38, Max:86    Temp  Avg: 37.4 C (99.4 F)  Min: 37 C (98.6 F)  Max: 37.9 C (100.2 F)  MAP (Non-Invasive)  Avg: 85.4 mmHG  Min: 50 mmHG  Max: 99 mmHG  Pulse  Avg: 92.5  Min: 80  Max: 101  Resp  Avg: 18.7  Min: 14  Max: 27  SpO2  Avg: 97 %  Min: 90 %  Max: 100 %  Pain Score (Numeric, Faces): 3    Current Medications:  acetaminophen (TYLENOL) 160 mg per 5 mL liquid - grape flavored, 650 mg, Gastric (NG, OG, PEG, GT), Q4H PRN  amantadine (SYMMETREL) 50 mg per 5 mL oral liquid, 100 mg, Gastric (NG, OG, PEG, GT), 2x/day  ascorbic acid (VITAMIN C) tablet, 500 mg, Gastric (NG, OG, PEG, GT), 2x/day  bacitracin 500 units/gram topical ointment tube, , Topical, 2x/day  banana flakes (BANATROL PLUS) packet, 1 Packet, Gastric (NG, OG, PEG, GT), Q8HRS  carvedilol (COREG) tablet, 12.5 mg, Gastric (NG, OG, PEG, GT), 2x/day-Food  chlorhexidine gluconate (PERIDEX) 0.12% mouthwash, 15 mL, Swish & Spit, 2x/day  enoxaparin PF (LOVENOX) 40 mg/0.4 mL SubQ injection, 40 mg, Subcutaneous, Q24H  famotidine (PEPCID) tablet, 20 mg, Gastric (NG, OG, PEG, GT), 2x/day  fentaNYL (SUBLIMAZE) 50 mcg/mL injection, 25 mcg, Intravenous, Q1H PRN  fosphenytoin (CEREBYX) 905 mg in D5W 100 mL IVPB, 10 mg/kg, Intravenous, Once  hydrALAZINE (APRESOLINE) injection 10 mg, 10 mg, Intravenous, Q4H  PRN  labetalol (TRANDATE) 5 mg/mL injection, 10 mg, Intravenous, Q1H PRN  lacosamide (VIMPAT) 200 mg/67m injection, 200 mg, Intravenous, 2x/day  lanolin-oxyquin-pet, hydrophil (BAG BALM) topical ointment, , Apply Topically, 2x/day PRN  levETIRAcetam (KEPPRA) tablet, 1,500 mg, Gastric (NG, OG, PEG, GT), 2x/day  lisinopril (PRINIVIL) tablet, 40 mg, Gastric (NG, OG, PEG, GT), Daily  nicotine (NICODERM CQ) transdermal patch (mg/24 hr), 14 mg, Transdermal, Daily  NS flush syringe, 2 mL, Intracatheter, Q8HRS    And  NS flush syringe, 2-6 mL, Intracatheter, Q1 MIN PRN  NS flush syringe, 10-30 mL, Intracatheter, Q8HRS  NS flush syringe, 20-30 mL, Intracatheter, Q1 MIN PRN  nutrition protein supplement 15 g per 30 mL packet, 1 Packet, Gastric (NG, OG, PEG, GT), 2x/day  nystatin (MYCOSTATIN) 100,000 units per mL oral liquid, 5 mL, Swish & Spit, 4x/day  nystatin (NYSTOP) 100,000 units/g topical powder, , Apply Topically, 2x/day  ondansetron (ZOFRAN) 2 mg/mL injection, 4 mg, Intravenous, Q6H PRN  perflutren lipid microspheres (DEFINITY) 1.3 mL in NS 10 mL (tot vol) injection, 2 mL, Intravenous, Give in Cardiology  phenytoin (DILANTIN) 50 mg/mL injection, 100 mg, Intravenous, Q8H  polyethylene glycol (MIRALAX) oral packet, 17 g, Gastric (NG, OG, PEG, GT), Daily PRN  potassium bicarbonate-citric acid (EFFER-K) effervescent tablet, 40 mEq, Gastric (NG, OG, PEG, GT),  Daily with Breakfast  senna concentrate (SENNA) 530m per 139moral liquid, 10 mL, Gastric (NG, OG, PEG, GT), 2x/day  SSIP insulin lispro (HUMALOG) 100 units/mL injection, 0-12 Units, Subcutaneous, Q4H PRN      Today's Physical Exam:   General: Appears very ill   Neurologic: E1 M5 V1T: does not open eyes, intubated (not sedated).  PERRL, no facial weakness, + cough + corneals.  Motor: More brisk compared to 12/23: LUE localizes, tries to localize RUE; BLE withdraw. UTA sensation  Cardiovascular:    Heart regular rate and rhythm  Lungs: diminished bilaterally  Abdomen:  soft, non-tender and bowel sounds normal  Extremities: no cyanosis or edema  Skin: Skin warm and dry    Labs:  I have reviewed all lab results.    I/O:  I/O last 24 hours:      Intake/Output Summary (Last 24 hours) at 07/16/2018 0717  Last data filed at 07/16/2018 0700  Gross per 24 hour   Intake 1737 ml   Output 2005 ml   Net -268 ml     I/O current shift:  12/24 0700 - 12/24 1859  In: -   Out: 30 [Urine:30]    Drips:   Current Facility-Administered Medications   Medication Dose Frequency Last Rate         Radiology Results:  MRI brain w/wo contrast 07/06/18: "acute postoperative changes related to gross total resection of a left medial tentorial meningioma with previously seen mass effect on the left thalamus, left middle cerebral peduncle and left midbrain. The current examination demonstrate hemorrhagic products are identified within the resection cavity extending into the parenchyma of the left thalamus and tracking down within the left midbrain and extending to the fourth Ventricle."        Impression and Plan:    Active Hospital Problems    Diagnosis   . 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 Tobacco use disorder   . Hypokalemia     [age]ear old female with progressive R sided weakness s/p left meningioma resection s/p Craniectomy for evacuation of tumor bed hematoma .       Neurologic:  L Tentorial meningioma s/p craniotomy for tumor resection, s/p L fronto-temporal craniectomy with evacuation for tumor bed hemorrhage  -- Q1H neuro checks   -- MRI brain w/wo contrast 07/06/18: see above for read  -- STAT CT brain without contrast for any acute change in neurological status   -Started amantadine 12/23, - D/c'd 12/24 d/t increased seizures    Multiple generalized seizures on vEEG  -vEEG  -Current AEDs   -Keppra 1500 BIG   -Vimpat 200 BID   -Phenytoin 100 TID  -Free phenytoin 1.1  -Re loaded with fosphenytoin 1039mg  - f/u free phenytoin @  1000    Pain/sedation  -- Tylenol PRN, none overnight    Cardiovascular:    HTN, chronic  -- SBP goal < 160  -- Hydralazine/labetalol PRN- x0/x0 overnight  -- Current antihypertensives:  -- Coreg 12.5 BID (increased 12/22)  -- Lisinopril 30m70mily  -- ECG 12/20- ST    Respiratory:    Acute respiratory failure requiring mechanical ventilation s/p tracheostomy   -- Tolerating ATC  -- CXR on 07/13/18: No acute process  -- Trach on 12/23  -- CXR 12/23 Well-circumscribed nodular opacity overlying the right lung base. Not seen on prior exam  -- Recommenced CT Chest as outpatient    Renal: No acute issues  -  Daily BMP  -Foley in place d/t excoriation     Infectious Disease: leucocytosis, improving, afebrile  -- Cultures:    -- Blood, urine, MRSA, and BAL negative    -- Completed on cefepime 12/23  -Procal 12/23 - <0.05  -UA 12/23 - Clean   -Upper and lower duplex ordered    Hematology:Normocytic anemia   -1u pRBCs 12/23 for hbg 6.8  -continue to monitor and transfuse if hb<7    DVT prophylaxis  - SCD's / lovenox     Gastrointestinal: Diarrhea   - C. Diff 12/23 - neg  - Banana flakes started 12/23  - last BM 12/24  - holding miralax/senna  - Pepcid 20 mg BID  - Checking LFTs    Nutrition  - Osmolite 1.5 5 cans/day with FWF 200 Q6  - Vit C  - Protein packet    Endocrine:  No acute issues  -- SSIP conservative  -- Glucose 125-150    Integumentary: Excoriation in inner thigh area  -Bag balm as needed  -Nystatin powder BID  -Foley in palce    Psychiatric:  No acute issues  -- Monitor for ICU delirium     Prophylaxis:    DVT/PE Prophylaxis: SCDs/ Venodynes/Impulse boots  GI: Famotidine  Mouth: Peridex    Family:  None present at bedside  Disposition/ Baseline:  ICU  Advance Directives:  None-Discussed  Hospice involvement prior to admission?  no    Code Status Information     Code Status    Full Code          Theodora Blow, APRN,NP-C  07/16/2018, 07:32      See Resident or Midlevel's Note for further details.    Critical Care  Attestation   I have reviewed the resident/ midlevel's note.  I agree with their findings and/or have made additions/ edits as well as my findings above.    I was present at the bedside of this critically ill patient for 40 minutes exclusive of procedures that are documented elsewhere.  My services were independent and non-duplicative of other practioners of other specialties (non-Neurocritical Care).    This patient suffers from failure or dysfunction of Neurological, respiratory system(s).   Neurological-    Persistent encephalopathy mildly improved compared to 12/23. Numerous seizures overnight, added VIM and PHT, continue Lev. If persistent seizures, add Onfi. Repeat free PHT level pending.   L tentorial meningioma s/p resection with post-operative hemorrhage s/p L DHC, stable on repeat neuroimaging. Continue nightly q2hr neurochecks. PT/OT.  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 Consider adding amantadine after acute issues resolved   Cardiovascular-HTN-well controlled on lisinopril, coreg.   Respiratory- Acute respiratory failure requiring mechanical ventilation s/p trach/PEG. Copious oral secretions, minimal inline. Doing well on ATC since 2pm 12/23 - continue indefinitely unless clinical change  R lung nodule - incidental finding; work up with CT chest as an outpatient  Infectious disease- Mild leukocytosis, fever - completed cefepime 12/22 empirically for PNA. All cultures thus far negative. Repeat UA, procalcitonin unremarkable. Fevers maybe from PNA that was treated/ DVT/superficial thrombosis but trend has improved.  Gastrointestinal-Diarrhea-improving. C. Diff negative. Continue banana flakes. Tolerating TEN. LFTs ok.  Renal- Maintain foley given skin excoriations and persistent diarrhea.   Hematological-Subacute anemia, drifted below 7 and received PRBC transfusion with appropriate response. Continue lovenox, SCDs  Superficial R cephalic thrombosis and L peroneal occlusive DVT. Repeat LE duplex  in 1 week to assess propagation, no indication for treatment at this time.  The care  of this patient was in regard to managing (a) conditions(s) that has a high probability of sudden, clinically significant, or life-threatening deterioration and required a high degree of Attending Physician attention and direct involvement to intervene urgently. Data review and care planning was performed in direct proximity of the patient, examination was obviously performed in direct contact with the patient. All of this time was exclusive of procedure which will be documented elsewhere in the chart.   My critical care time involved full attention to the patient's condition and included:   Review of nursing notes and/or old charts   Review of medications, allergies, and vital signs   Documentation time   Consultant collaboration on findings and treatment options   Care, transfer of care, and discharge plans   Ordering, interpreting, and reviewing diagnostic studies/ lab tests   Obtaining necessary history from family, EMS, nursing home staff and/or treating physicians     My critical care time did not include time spent teaching resident physician(s) or other services of resident physicians, or performing other reported procedures.     Total Critical Care Time: 40 minutes

## 2018-07-16 NOTE — Care Plan (Signed)
Patient remained on ATC 28% all night with no issues. Ventilator remains on stand by at this time. We will continue to monitor and wean as tolerated.

## 2018-07-16 NOTE — Procedures (Signed)
[name], [name]   HOSPITAL NUMBER:  [id number]  DATE OF SERVICE:  07/15/2018  DOB:  [birth year]  SEX:  F      Date of Study:  July 15, 2018, start time 16:27:14, to July 16, 2018, end time 16:28  EEG #: [phone].  Technician:  AP.    REQUESTING PROVIDER:  Theodora Blow, APRN, NP-C.    HISTORY:  The patient is a 57 year old woman with a history of gradually progressive extremity weakness, found to have left tentorial meningioma and filling defect in left transverse sinus concerning for venous thrombosis.  She is status post craniotomy for tumor resection.  EEG is requested to look for subclinical seizures.    MEDICATIONS:  1. Vimpat 200 mg twice a day.  2. Keppra 1500 mg twice a day.  3. Dilantin 100 mg q.8h.    REPORT:  This is a digitally acquired video EEG performed with the standard 10-20 system of electrode placement.  Background was characterized by generalized polymorphic delta admixed with theta and beta activity.    No posterior dominant rhythm or anteroposterior gradient was seen.   Background was continuous and symmetric.   Sleep was characterized by symmetric sleep spindles.   Hyperventilation and photic stimulation were not performed.   No clinical spells were marked.    ABNORMAL ACTIVITY:  Abundant long runs of left hemispheric periodic discharges (LPDs) at 2.5 to 3 Hz consistent with nonconvulsive status epilepticus.   Occasional brief periods of generalized periodic discharges at 2.5 to 3 Hz consistent with periods of nonconvulsive status as well.    INTERPRETATION:  Abundant long periods of left hemispheric periodic discharges at 2.5 to 3 Hz consistent with nonconvulsive status epilepticus.    Occasional brief periods of generalized periodic discharges at 2.5 to 3 Hz suggesting nonconvulsive seizures as well.       Olean Ree MD  Assistant Professor, Neurology  [facility]            CC:   Theodora Blow, APRN, NP-C   [address]   [place], [place]  [id number]       DD:  07/16/2018 13:27:32  DT:  07/16/2018 13:43:13 CB  D#:  [id number]

## 2018-07-17 ENCOUNTER — Inpatient Hospital Stay (HOSPITAL_COMMUNITY)

## 2018-07-17 DIAGNOSIS — Z452 Encounter for adjustment and management of vascular access device: Secondary | ICD-10-CM

## 2018-07-17 LAB — PHOSPHORUS: PHOSPHORUS: 2.8 mg/dL (ref 2.4–4.7)

## 2018-07-17 LAB — ARTERIAL BLOOD GAS/LACTATE
%FIO2 (ARTERIAL): 28 %
BASE EXCESS (ARTERIAL): 3.7 mmol/L — ABNORMAL HIGH (ref 0.0–1.0)
BICARBONATE (ARTERIAL): 27.8 mmol/L — ABNORMAL HIGH (ref 18.0–26.0)
LACTATE: 1 mmol/L (ref 0.0–1.3)
PAO2/FIO2 RATIO: 375 (ref <=200)
PCO2 (ARTERIAL): 32 mmHg — ABNORMAL LOW (ref 35.0–45.0)
PH (ARTERIAL): 7.52 — ABNORMAL HIGH (ref 7.35–7.45)
PO2 (ARTERIAL): 105 mmHg — ABNORMAL HIGH (ref 72.0–100.0)

## 2018-07-17 LAB — CBC
HCT: 25.6 % — ABNORMAL LOW (ref 34.8–46.0)
HGB: 8.1 g/dL — ABNORMAL LOW (ref 11.5–16.0)
MCH: 29 pg (ref 26.0–32.0)
MCHC: 31.6 g/dL (ref 31.0–35.5)
MCV: 91.8 fL (ref 78.0–100.0)
MPV: 9.9 fL (ref 8.7–12.5)
PLATELETS: 496 10*3/uL — ABNORMAL HIGH (ref 150–400)
RBC: 2.79 10*6/uL — ABNORMAL LOW (ref 3.85–5.22)
RDW-CV: 15.3 % (ref 11.5–15.5)
WBC: 13.9 10*3/uL — ABNORMAL HIGH (ref 3.7–11.0)

## 2018-07-17 LAB — BASIC METABOLIC PANEL
ANION GAP: 8 mmol/L (ref 4–13)
BUN/CREA RATIO: 27 — ABNORMAL HIGH (ref 6–22)
BUN: 17 mg/dL (ref 8–25)
CALCIUM: 8.4 mg/dL — ABNORMAL LOW (ref 8.5–10.2)
CHLORIDE: 104 mmol/L (ref 96–111)
CO2 TOTAL: 26 mmol/L (ref 22–32)
CREATININE: 0.62 mg/dL (ref 0.49–1.10)
ESTIMATED GFR: >60 mL/min/{1.73_m2} (ref >60)
GLUCOSE: 112 mg/dL (ref 65–139)
POTASSIUM: 3.7 mmol/L (ref 3.5–5.1)
SODIUM: 138 mmol/L (ref 136–145)

## 2018-07-17 LAB — PHENYTOIN, FREE: FREE PHENYTOIN LEVEL: 1.2 ug/mL (ref 1.0–2.0)

## 2018-07-17 LAB — VENOUS BLOOD GAS/LACTATE
%FIO2 (VENOUS): 30 %
BASE EXCESS: 4.6 mmol/L — ABNORMAL HIGH (ref ?–3.0)
BICARBONATE (VENOUS): 28.4 mmol/L — ABNORMAL HIGH (ref 22.0–26.0)
LACTATE: 1.2 mmol/L (ref 0.0–1.3)
PCO2 (VENOUS): 38 mmHg — ABNORMAL LOW (ref 41.00–51.00)
PH (VENOUS): 7.48 — ABNORMAL HIGH (ref 7.31–7.41)
PO2 (VENOUS): 58 mmHg — ABNORMAL HIGH (ref 35.0–50.0)

## 2018-07-17 LAB — POC BLOOD GLUCOSE (RESULTS)
GLUCOSE, POC: 117 mg/dL — ABNORMAL HIGH (ref 70–105)
GLUCOSE, POC: 124 mg/dL — ABNORMAL HIGH (ref 70–105)
GLUCOSE, POC: 145 mg/dL — ABNORMAL HIGH (ref 70–105)
GLUCOSE, POC: 88 mg/dL (ref 70–105)
GLUCOSE, POC: 91 mg/dL (ref 70–105)
GLUCOSE, POC: 98 mg/dL (ref 70–105)

## 2018-07-17 LAB — URINE CULTURE: URINE CULTURE: >100000 — AB

## 2018-07-17 LAB — ADULT ROUTINE BLOOD CULTURE, SET OF 2 BOTTLES (BACTERIA AND YEAST): BLOOD CULTURE, ROUTINE: NO GROWTH

## 2018-07-17 LAB — MAGNESIUM: MAGNESIUM: 2 mg/dL (ref 1.6–2.6)

## 2018-07-17 MED ORDER — SODIUM CHLORIDE 0.9 % INTRAVENOUS SOLUTION
10.0000 mg/kg | Freq: Once | INTRAVENOUS | Status: AC
Start: 2018-07-17 — End: 2018-07-17
  Administered 2018-07-17: 718 mg via INTRAVENOUS
  Filled 2018-07-17: qty 14.5

## 2018-07-17 MED ORDER — CLOBAZAM 2.5 MG/ML ORAL SUSPENSION
10.00 mg | Freq: Three times a day (TID) | ORAL | Status: DC
Start: 2018-07-17 — End: 2018-07-19
  Administered 2018-07-17 – 2018-07-19 (×6): 10 mg via GASTROSTOMY
  Filled 2018-07-17 (×2): qty 4
  Filled 2018-07-17: qty 3
  Filled 2018-07-17 (×2): qty 4
  Filled 2018-07-17: qty 1
  Filled 2018-07-17: qty 4

## 2018-07-17 MED ORDER — CLOBAZAM 2.5 MG/ML ORAL SUSPENSION
10.00 mg | Freq: Two times a day (BID) | ORAL | Status: DC
Start: 2018-07-17 — End: 2018-07-17
  Administered 2018-07-17: 10 mg via GASTROSTOMY
  Filled 2018-07-17: qty 4

## 2018-07-17 MED ORDER — MIDAZOLAM 1 MG/ML INJECTION SOLUTION
5.0000 mg | Freq: Once | INTRAMUSCULAR | Status: AC
Start: 2018-07-17 — End: 2018-07-17
  Administered 2018-07-17: 5 mg via INTRAVENOUS
  Filled 2018-07-17: qty 5

## 2018-07-17 MED ORDER — MIDAZOLAM 1 MG/ML IN 0.9 % SODIUM CHLORIDE INTRAVENOUS
5.0000 mg/h | INTRAVENOUS | Status: DC
Start: 2018-07-17 — End: 2018-07-18
  Administered 2018-07-17 (×3): 5 mg/h via INTRAVENOUS
  Administered 2018-07-18: 2 mg/h via INTRAVENOUS
  Administered 2018-07-18: 3 mg/h via INTRAVENOUS
  Administered 2018-07-18: 1 mg/h via INTRAVENOUS
  Administered 2018-07-18: 0 mg/h via INTRAVENOUS
  Administered 2018-07-18: 4 mg/h via INTRAVENOUS
  Administered 2018-07-18: 1 mg/h via INTRAVENOUS
  Filled 2018-07-17 (×2): qty 100

## 2018-07-17 MED ORDER — DEXTROSE 5 % IN WATER (D5W) INTRAVENOUS SOLUTION
1.0000 g | INTRAVENOUS | Status: DC
Start: 2018-07-17 — End: 2018-07-18
  Administered 2018-07-17: 0 g via INTRAVENOUS
  Administered 2018-07-17: 1 g via INTRAVENOUS
  Filled 2018-07-17 (×2): qty 10

## 2018-07-17 MED ADMIN — carvediloL 12.5 mg tablet: GASTROSTOMY | @ 08:00:00

## 2018-07-17 MED ADMIN — oxyCODONE 5 mg tablet: INTRAVENOUS | @ 01:00:00

## 2018-07-17 MED ADMIN — potassium chloride 40 mEq/L in 0.9 % sodium chloride intravenous: INTRAVENOUS | @ 21:00:00 | NDC 00338069504

## 2018-07-17 MED ADMIN — lactated Ringers intravenous solution: @ 21:00:00 | NDC 00338011704

## 2018-07-17 MED ADMIN — sodium chloride 0.9 % (flush) injection syringe: GASTROSTOMY | @ 15:00:00

## 2018-07-17 NOTE — Nurses Notes (Signed)
Patients PICC line appeared to be leaking around site. Both lines failed to draw. Dressing was changed and Stat nurse was called to assess. NCCU service was notified and stat chest xray order was placed to verify tip placement.

## 2018-07-17 NOTE — Progress Notes (Signed)
[facility]  NEUROSURGERY   PROGRESS NOTE    [name], [name], 57 y.o. female  Date of Admission:  07/05/2018  Date of Service: 07/18/2018  Date of Birth:  [birth year]     Chief Complaint: right upper and lower extremity weakness    Subjective:   NAEO    Vital Signs:  Temp (24hrs) Max:37.6 C (44.9 F)      Systolic (67RFF), MBW:466 , Min:95 , ZLD:357     Diastolic (01XBL), TJQ:30, Min:61, Max:87    Temp  Avg: 37.4 C (99.3 F)  Min: 37.1 C (98.8 F)  Max: 37.6 C (99.7 F)  MAP (Non-Invasive)  Avg: 88.2 mmHG  Min: 73 mmHG  Max: 100 mmHG  Pulse  Avg: 97.5  Min: 92  Max: 104  Resp  Avg: 23.5  Min: 16  Max: 30  SpO2  Avg: 96.3 %  Min: 93 %  Max: 100 %  Pain Score (Numeric, Faces): 3    Min/Max/Avg ICP/CPP last 24hrs:   No data recorded    Today's Physical Exam:    Appears acutely ill, intubated  GCS 1 5 1  T  -- Eyes do not open to noxious stimuli  -- Localizes in LUE, Withdraws in RUE and BLE  PERRL  Face symmetric  +cough    Unable to assess hearing  Unable to assess speech  Unable to assess fund of knowledge  Unable to assess attention span & concentration  Unable to assess recent and remote memory  CN 3 4 6  EOMI Unable to assess  CN 11 shrug symmetric Unable to assess  Muscle Strength Unable to assess  Unable to assess drift    -- L cranial staples c/d/i  -- JP drain Rapide  -- LD Rapide    Assessment/Plan:  [name] is a 57 yo F PMH HTN, HLD w/ 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 POD13/13  -- FU video EEG, two events overnight on 07/16/18   -- Versed gtt   -- Keppra 1500 mg 2x/day   -- Vimpat 200 mg 2x/day   -- Dilantin 100 mg q8h   -- Clobazam 5 mg 2x/day   -- Will need outpatient neurology follow-up  -- Please bathe patient daily and clean wound daily  -- Okay for  soap/shampoo  -- Apply bacitracin ointment BID, cleaning old ointment off before application of new ointment  -- Final pathology: WHO grade I meningioma    -- Imaging:   -- Venous duplex LLE (07/23/18): ordered   -- Venous duplex BLE (07/16/18): left peroneal (calf vein) DVT   -- Venous duplex BUE (07/16/18): no evidence of DVT   -- TTE (07/12/18): EF 75%, no regional wall abnormalities   -- CT brain w/o (07/11/18): stable    -- CT brain w/o (07/09/18 @ 1328):stable   -- CT brain w/o (07/09/18 @ 0430):stable   -- CT brain w/o (07/08/18):postop changes from decompressive craniectomy with interval mild extracalvarial brain herniation through the craniectomy site; mild increase in cerebral edema at the tumor resection bed and adjacent parenchyma without significant increase in hemorrhage   -- MRI brain w/wo (07/06/18):gross total resection of a left medial tentorial meningioma; hemorrhagic productswithin the resection cavity extending into the parenchyma of the left thalamus and tracking down within the left midbrain and extending to the fourth ventricle   -- CT brain  w/o (07/06/18): postop changes from left decompressive craniectomy for evacuation of hematoma of tumor bed with residual hemorrhage and improved edema and rightward midline shift  -- CT brain w/o (07/06/18): postop changes from decompressive craniectomy for evacuation of tumor bed hematoma with minimal residual blood at the surgical site   -- CT brain w/o (07/05/18): postop changes with development of hematoma at tumor bed and 8 mm of midline shift  -- Pain/spasm control: Tylenol PRN, Fentanyl PRN  -- Diet: NPO + Osmolite TF + FWF 200 cc q6h  -- Bowel regimen, last BM 07/17/18  -- Abx: Ceftriaxone empiric UTI, end 12/29   -- Blood Cx (07/12/18): negative   -- CDiff (07/15/18): negative   -- Urine Cx (07/16/18): C. albicans  -- Activity: up in chair with assist   -- Craniectomy precautions, helmet to be worn at all times when  OOB  -- PT/OT: SNF vs LTAC  -- DVT ppx: Lovenox, SCDs/Venodynes  -- Consults:    NCCU/Neurology   -- Will need outpatient neurology follow-up for AED management   Trauma   -- Trach/PEG completed 07/15/18  -- Lines/Drains: Midline, Trach/PEG, Foley catheter  -- Wound: L fronto-temporal staples, JP drain Rapide, LD Rapide, helmet  -- Disposition: ongoing    Leta Baptist, MD  PGY-3 Neurosurgery  07/18/2018       I saw and examined the patient.  I reviewed the resident's note.  I agree with the findings and plan of care as documented in the resident's note.  Any exceptions/additions are edited/noted.    [name], MD

## 2018-07-17 NOTE — Progress Notes (Addendum)
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 Was given 28m ativan, 526mVersed, then started on Versed gtt (89m63mr).      Vital Signs:  Temp (24hrs) Max:39.5 C (103315.4      Systolic (24h00QQPAvgYPP:509Min:100 , MaxTOI:712  Diastolic (24h45YKDAvgXIP:38in:60, Max:90    Temp  Avg: 38.2 C (100.8 F)  Min: 37.4 C (99.3 F)  Max: 39.5 C (103.1 F)  MAP (Non-Invasive)  Avg: 88.9 mmHG  Min: 72 mmHG  Max: 106 mmHG  Pulse  Avg: 103.2  Min: 95  Max: 116  Resp  Avg: 22.7  Min: 10  Max: 37  SpO2  Avg: 96.3 %  Min: 90 %  Max: 100 %  Pain Score (Numeric, Faces): 3    Current Medications:  acetaminophen (TYLENOL) 160 mg per 5 mL liquid - grape flavored, 650 mg, Gastric (NG, OG, PEG, GT), Q4H PRN  ascorbic acid (VITAMIN C) tablet, 500 mg, Gastric (NG, OG, PEG, GT), 2x/day  bacitracin 500 units/gram topical ointment tube, , Topical, 2x/day  banana flakes (BANATROL PLUS) packet, 1 Packet, Gastric (NG, OG, PEG, GT), Q8HRS  carvedilol (COREG) tablet, 12.5 mg, Gastric (NG, OG, PEG, GT), 2x/day-Food  chlorhexidine gluconate (PERIDEX) 0.12% mouthwash, 15 mL, Swish & Spit, 2x/day  cloBAZam (ONFI) 2.5 mg per mL oral liquid, 5 mg, Duodenal, 2x/day  enoxaparin PF (LOVENOX) 40 mg/0.4 mL SubQ injection, 40 mg, Subcutaneous, Q24H  famotidine (PEPCID) tablet, 20 mg, Gastric (NG, OG, PEG, GT), 2x/day  fentaNYL (SUBLIMAZE) 50 mcg/mL injection, 25 mcg, Intravenous, Q1H PRN  hydrALAZINE (APRESOLINE) injection 10 mg, 10 mg, Intravenous, Q4H PRN  labetalol (TRANDATE) 5 mg/mL injection, 10 mg, Intravenous, Q1H  PRN  lacosamide (VIMPAT) 200 mg/17m33mjection, 200 mg, Intravenous, 2x/day  lanolin-oxyquin-pet, hydrophil (BAG BALM) topical ointment, , Apply Topically, 2x/day PRN  levETIRAcetam (KEPPRA) tablet, 1,500 mg, Gastric (NG, OG, PEG, GT), 2x/day  lisinopril (PRINIVIL) tablet, 40 mg, Gastric (NG, OG, PEG, GT), Daily  midazolam (VERSED) 100 mg in NS 100 mL premix infusion, 5 mg/hr, Intravenous, Continuous  nicotine (NICODERM CQ) transdermal patch (mg/24 hr), 14 mg, Transdermal, Daily  NS flush syringe, 2 mL, Intracatheter, Q8HRS    And  NS flush syringe, 2-6 mL, Intracatheter, Q1 MIN PRN  NS flush syringe, 10-30 mL, Intracatheter, Q8HRS  NS flush syringe, 20-30 mL, Intracatheter, Q1 MIN PRN  nutrition protein supplement 15 g per 30 mL packet, 1 Packet, Gastric (NG, OG, PEG, GT), 2x/day  nystatin (MYCOSTATIN) 100,000 units per mL oral liquid, 5 mL, Swish & Spit, 4x/day  nystatin (NYSTOP) 100,000 units/g topical powder, , Apply Topically, 2x/day  ondansetron (ZOFRAN) 2 mg/mL injection, 4 mg, Intravenous, Q6H PRN  perflutren lipid microspheres (DEFINITY) 1.3 mL in NS 10 mL (tot vol) injection, 2 mL, Intravenous, Give in Cardiology  phenytoin (DILANTIN) 50 mg/mL injection, 100 mg, Intravenous, Q8H  potassium bicarbonate-citric acid (EFFER-K) effervescent tablet, 40 mEq, Gastric (NG, OG, PEG, GT), Daily with Breakfast  SSIP insulin lispro (HUMALOG) 100 units/mL injection, 0-12 Units, Subcutaneous, Q4H  PRN      Today's Physical Exam:   General: Acutely ill  Neurologic: E1 M5 V1T: does not open eyes, intubated.  PERRL, no facial weakness, + cough.  Motor: More brisk compared to 12/23: LUE localizes, tries to localize RUE; BLE withdraw. UTA sensation  Cardiovascular: Heart regular rate and rhythm  Lungs: diminished bilaterally  Abdomen: soft, non-tender and bowel sounds normal  Extremities: no cyanosis or edema  Skin: Skin warm and dry    Labs:  I have reviewed all lab results.    I/O:  I/O last 24 hours:      Intake/Output  Summary (Last 24 hours) at 07/17/2018 0647  Last data filed at 07/17/2018 0600  Gross per 24 hour   Intake 3705 ml   Output 1105 ml   Net 2600 ml     I/O current shift:  12/24 1900 - 12/25 0659  In: 1211 [P.O.:490; I.V.:10; SV:779]  Out: 525 [Urine:525]    Drips:   Current Facility-Administered Medications   Medication Dose Frequency Last Rate   . midazolam (VERSED) 100 mg in NS 100 mL premix infusion  5 mg/hr Continuous 5 mg/hr (07/17/18 0400)         Radiology Results:    CT brain 07/11/18: "1.  Stable postoperative changes from left decompressive craniectomy with persistent extracalvarial brain herniation.  2.  Persistent cerebral edema and hemorrhagic tumor resection site. No new  Hemorrhage."    MRI brain w/wo contrast 07/06/18: "acute postoperative changes related to gross total resection of a left medial tentorial meningioma with previously seen mass effect on the left thalamus, left middle cerebral peduncle and left midbrain. The current examination demonstrate hemorrhagic products are identified within the resection cavity extending into the parenchyma of the left thalamus and tracking down within the left midbrain and extending to the fourth Ventricle."        Impression and Plan:    Active Hospital Problems    Diagnosis   . 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 Tobacco use disorder   . Hypokalemia     57 year old female with progressive R sided weakness s/p left meningioma resection s/p Craniectomy for evacuation of tumor bed hematoma.       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  -vEEG  -Current AEDs   -Keppra 1500 BID   -Vimpat 200 BID   -Phenytoin 100 TID   -Onfi 34m TID (increased 12/25)  -started on Versed gtt (578mhr) 12/25 for 2 seizures overnight   -Free phenytoin 1.7 12/24, repeat 12/25    Pain/sedation  -- Tylenol PRN, none overnight    Cardiovascular:    HTN, chronic  -- SBP goal < 160  -- Hydralazine/labetalol PRN- x0/x0 overnight  -- Current antihypertensives:  -- Coreg 12.5 BID (increased 12/22)  -- Lisinopril 4061maily  -- ECG 12/20- ST    Respiratory:    Acute respiratory failure requiring mechanical ventilation s/p tracheostomy   -- Vented on CPAP/PS since 1500 12/24. Moved back to ATC 12/25, VBG q8hr while on ATC, if CO2 climbs ok  to place back on vent   -- CXR on 07/13/18: No acute process  -- Trach on 12/23  -- CXR 12/23 Well-circumscribed nodular opacity overlying the right lung base. Not seen on prior exam  -- Recommenced CT Chest as outpatient    Renal: No acute issues  -Daily BMP  -Foley in place d/t excoriation     Infectious Disease: leucocytosis, stable, afebrile  -- Cultures:    -- Blood, urine, MRSA, and BAL negative    -- Completed on cefepime 12/23  -Procal 12/23 - <0.05  -UA 12/23 - Candida albicans, replaced foley cath  -UA 12/24: Leukocytes, started on Rocephin x 5days for complicated UTI  -Repeat BC and BAL 12/25    Hematology:Normocytic anemia   -1u pRBCs 12/23 for hbg 6.8  -continue to monitor and transfuse if hb<7    DVT prophylaxis  - SCD's / lovenox     LLE DVT  Peripheral Venous Duplex 07/16/18: LLE DVT (calf vein)  --repeat in 1 week / 07/23/18 (ordered)    Gastrointestinal: Diarrhea   - C. Diff 12/23 - neg  - Banana flakes started 12/23  - last BM 12/24  - holding miralax/senna  - Pepcid 20 mg BID  - Checking LFTs    Nutrition  - Osmolite 1.5 5 cans/day with FWF 200 Q6  - Vit C  - Protein packet    Endocrine:  No acute issues  -- SSIP conservative  -- Glucose 91-126    Integumentary: Excoriation in inner thigh area  -Bag balm as  needed  -Nystatin powder BID  -Remove foley cath today    Psychiatric:  No acute issues  -- Monitor for ICU delirium     Prophylaxis:    DVT/PE Prophylaxis: SCDs/ Venodynes/Impulse boots  GI: Famotidine   Mouth: Peridex    Family:  None present at bedside  Disposition/ Baseline:  ICU  Advance Directives:  None-Discussed  Hospice involvement prior to admission?  no    Code Status Information     Code Status    Full Code          Morrell Riddle, APRN,NP-C  07/17/2018, 07:01    See Resident or Midlevel's Note for further details.    Critical Care Attestation  I have reviewed the resident/ midlevel's note. I agree with their findings and/or have made additions/ edits as well as my findings above.    I was present at the bedside of this critically ill patient for4mnutes exclusive of procedures that are documented elsewhere. My services were independent and non-duplicative of other practioners of other specialties (non-Neurocritical Care).    This patient suffers from failure or dysfunction of Neurological, respiratorysystem(s).   Neurological-  Subclinical generalized status epilepticus, requiring VIM, PHT, Onfi, Versed infusion titrated to sz suppression for 24 hours of seizure freedom. Uptitrate Onfi. Repeat free PHT level pending. Continue vEEG  L tentorial meningioma s/p resection with post-operative hemorrhage s/p L DHC, stable on repeat neuroimaging. Continue q2hrs neurochecks. PT/OT.  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 Consider adding amantadine after acute issues resolved   Cardiovascular-HTN-well controlled on lisinopril, coreg.  Respiratory-Acute respiratory failure requiring mechanical ventilation s/p trach/PEG. Copious oral secretions, minimal inline. Doing well on ATC since 730am despite being on versed - check ABG now, trend VBG closely - if any indication of decreased minute ventilation, place on ASV  R lung nodule - incidental finding; work up with CT chest as an  outpatient  Infectious disease-Mild leukocytosis, numerous high-grade fevers possibly from  new complicated UTI - start cefriaxone, awaiting urine culture. Completed cefepime 12/22 empirically for PNA. Repeat cultures pending; low threshold for antibiotics. Fevers maybe central or due to DVT/superficial thrombosis but trend has improved.  Gastrointestinal-Diarrhea-resolved. C. Diff negative. Continue banana flakes. Tolerating TEN. LFTs ok.  Renal-D/c foley given improvement in skin excoriations and improved diarrhea.  Hematological-Subacute anemia, drifted below 7 and received PRBC transfusion with appropriate response. Continue lovenox, SCDs  Superficial R cephalic thrombosis and L peroneal occlusive DVT. Repeat LE duplex in 1 week to assess propagation, no indication for treatment at this time.  The care of this patient was in regard to managing (a) conditions(s) that has a high probability of sudden, clinically significant, or life-threatening deterioration and required a high degree of Attending Physician attention and direct involvement to intervene urgently. Data review and care planning was performed in direct proximity of the patient, examination was obviously performed in direct contact with the patient. All of this time was exclusive of procedure which will be documented elsewhere in the chart.   My critical care time involved full attention to the patient's condition and included:   Review of nursing notes and/or old charts   Review of medications, allergies, and vital signs   Documentation time   Consultant collaboration on findings and treatment options   Care, transfer of care, and discharge plans   Ordering, interpreting, and reviewing diagnostic studies/ lab tests   Obtaining necessary history from family, EMS, nursing home staff and/or treating physicians     My critical care time did not include time spent teaching resident physician(s) or other services of resident physicians, or performing  other reported procedures.     Total Critical Care Time:65mnutes

## 2018-07-17 NOTE — Procedures (Signed)
[name], [name]   HOSPITAL NUMBER:  [id number]  DATE OF SERVICE:  07/16/2018  DOB:  November 12, 1960  SEX:  F      Date of Study:  July 16, 2018, start time 1629; end time July 17, 2018, 16:31.  EEG #: [id number].  Technician: AP.    REQUESTING PHYSICIAN:  Theodora Blow, APRN, NP-C.    HISTORY:  The patient is a 57 year old woman with history of gradually progressive extremity weakness, found to have left anterior meningioma, status post craniotomy for tumor resection.  EEG is requested to look for subclinical seizures.    MEDICATIONS:  1. Vimpat 200 twice a day.  2. Keppra 1500 twice a day.  3. Dilantin 100 twice a day.  4. Onfi 10 mg every 8 hours.  5. Versed 5 mg an hour.    REPORT:  This is a digitally acquired video EEG performed with the standard 10-20 system of electrode placement.   The background was characterized by generalized polymorphic delta admixed with theta frequencies.   No posterior dominant rhythm or anteroposterior gradient was seen.   Background was continuous and symmetric   State change was seen.  No normal sleep architecture was seen.   Hyperventilation and photic stimulation were not performed.   No clinical events were marked.   Abnormal activity:  Abundant long runs of generalized periodic discharges at 1.5-2 Hz were seen.    INTERPRETATION:  Abnormal inpatient EEG due to:  1. Abundant long runs of generalized periodic discharges at 1.5-2 Hz is on the interictal continuum.  This pattern may suggest possible nonconvulsive status  2. Moderate generalized slowing suggesting underlying diffuse or multifocal dysfunction.        Olean Ree MD  Assistant Professor, Neurology  [facility]            CC:   Theodora Blow, APRN, NP-C   [phone]   , [place] [id number]       DD:  07/17/2018 14:12:22  DT:  07/17/2018 14:38:07 KG  D#:  [id number]

## 2018-07-17 NOTE — Care Plan (Signed)
Problem: Adult Inpatient Plan of Care  Goal: Patient-Specific Goal (Individualization)  Outcome: Ongoing (see interventions/notes)  Flowsheets (Taken 07/17/2018 0547)  Individualized Care Needs: Tube feed Q4H with 250ml water flushes Q6H    Patient Goals Based on Modifiable Risk Factors    The following modifiable risk factors were discussed with patient:     Hypercholesteremia    Goal(s):  Normal total less than 200    Patient's selected lifestyle modification(s):  Take lipid lowering medications as prescribed    Patient/Family/Caregiver response to plan:    Patient unable to verbalize at this time.    GCS 1-5-1.Versed gtt initiated per order for seizure activities. FS < 118. Monitoring continues.

## 2018-07-17 NOTE — Care Plan (Signed)
Problem: Adult Inpatient Plan of Care  Goal: Plan of Care Review  Outcome: Ongoing (see interventions/notes)  Flowsheets (Taken 07/17/2018 1811)  Plan of Care Reviewed With: patient  Outcome Summary: Patient has remained seizure free for 14 hours.  Progress: improving  Goal: Patient-Specific Goal (Individualization)  Outcome: Ongoing (see interventions/notes)  Flowsheets  Taken 07/17/2018 0547 by Dorina Hoyer, RN  Individualized Care Needs: Tube feed Q4H with 218ml water flushes Q6H  Taken 07/17/2018 1811 by Arsenio Katz, RN  Anxieties, Fears or Concerns: Will she need to be on seizure medications forever?  Taken 07/16/2018 1125 by Cherlyn Labella, PT  Patient-Specific Goals (Include Timeframe): Family wanting her to open eyes and be able to see, hopeful for continued gains re cognition and movement  Goal: Absence of Hospital-Acquired Illness or Injury  Outcome: Ongoing (see interventions/notes)  Goal: Optimal Comfort and Wellbeing  Outcome: Ongoing (see interventions/notes)  Goal: Rounds/Family Conference  Outcome: Ongoing (see interventions/notes)     Problem: Communication Impairment (Mechanical Ventilation, Invasive)  Goal: Effective Communication  Outcome: Ongoing (see interventions/notes)     Problem: Device-Related Complication Risk (Mechanical Ventilation, Invasive)  Goal: Optimal Device Function  Outcome: Ongoing (see interventions/notes)     Problem: Inability to Wean (Mechanical Ventilation, Invasive)  Goal: Mechanical Ventilation Liberation  Outcome: Ongoing (see interventions/notes)     Problem: Nutrition Impairment (Mechanical Ventilation, Invasive)  Goal: Optimal Nutrition Delivery  Outcome: Ongoing (see interventions/notes)     Problem: Skin and Tissue Injury (Mechanical Ventilation, Invasive)  Goal: Absence of Device-Related Skin and Tissue Injury  Outcome: Ongoing (see interventions/notes)     Problem: Ventilator-Induced Lung Injury (Mechanical Ventilation, Invasive)  Goal: Absence of  Ventilator-Induced Lung Injury  Outcome: Ongoing (see interventions/notes)     Problem: Fall Injury Risk  Goal: Absence of Fall and Fall-Related Injury  Outcome: Ongoing (see interventions/notes)     Problem: Skin Injury Risk Increased  Goal: Skin Health and Integrity  Outcome: Ongoing (see interventions/notes)     Problem: Hypertension Comorbidity  Goal: Blood Pressure in Desired Range  Outcome: Ongoing (see interventions/notes)     Problem: Depression  Goal: Improved Mood  Outcome: Ongoing (see interventions/notes)     Problem: Feeding Intolerance (Enteral Nutrition)  Goal: Feeding Tolerance  Outcome: Ongoing (see interventions/notes)     Problem: Bleeding (Craniotomy/Craniectomy/Cranioplasty)  Goal: Absence of Bleeding  Outcome: Ongoing (see interventions/notes)     Problem: Cerebral Tissue Perfusion Risk (Craniotomy/Craniectomy/Cranioplasty)  Goal: Effective Cerebral Tissue Perfusion  Outcome: Ongoing (see interventions/notes)     Problem: Fluid Imbalance (Craniotomy/Craniectomy/Cranioplasty)  Goal: Fluid Balance  Outcome: Ongoing (see interventions/notes)     Problem: Infection (Craniotomy/Craniectomy/Cranioplasty)  Goal: Absence of Infection Signs/Symptoms  Outcome: Ongoing (see interventions/notes)     Problem: Pain (Craniotomy/Craniectomy/Cranioplasty)  Goal: Acceptable Pain Control  Outcome: Ongoing (see interventions/notes)     Problem: Bleeding (Surgery Nonspecified)  Goal: Absence of Bleeding  Outcome: Ongoing (see interventions/notes)     Problem: Bowel Elimination Impaired (Surgery Nonspecified)  Goal: Effective Bowel Elimination  Outcome: Ongoing (see interventions/notes)     Problem: Infection (Surgery Nonspecified)  Goal: Absence of Infection Signs/Symptoms  Outcome: Ongoing (see interventions/notes)     Problem: Pain (Surgery Nonspecified)  Goal: Acceptable Pain Control  Outcome: Ongoing (see interventions/notes)     Problem: Communication Impairment (Artificial Airway)  Goal: Effective  Communication  Outcome: Ongoing (see interventions/notes)     Problem: Device-Related Complication Risk (Artificial Airway)  Goal: Optimal Device Function  Outcome: Ongoing (see interventions/notes)     Problem: Skin and Tissue Injury (  Artificial Airway)  Goal: Absence of Device-Related Skin or Tissue Injury  Outcome: Ongoing (see interventions/notes)

## 2018-07-17 NOTE — Care Plan (Signed)
Pt remains on the ventilator in the PSV mode with the following settings, PS8, PEEP 5 and 30%. PT to come off vent to an ATC during the day. respiratory to follow and wean pt as tolerated

## 2018-07-18 LAB — POC BLOOD GLUCOSE (RESULTS)
GLUCOSE, POC: 104 mg/dL (ref 70–105)
GLUCOSE, POC: 106 mg/dL — ABNORMAL HIGH (ref 70–105)
GLUCOSE, POC: 107 mg/dL — ABNORMAL HIGH (ref 70–105)
GLUCOSE, POC: 168 mg/dL — ABNORMAL HIGH (ref 70–105)
GLUCOSE, POC: 91 mg/dL (ref 70–105)

## 2018-07-18 LAB — VENOUS BLOOD GAS/LACTATE
%FIO2 (VENOUS): 28 %
%FIO2 (VENOUS): 28 %
BASE EXCESS: 2.6 mmol/L (ref ?–3.0)
BASE EXCESS: 2.3 mmol/L (ref ?–3.0)
BICARBONATE (VENOUS): 26.3 mmol/L — ABNORMAL HIGH (ref 22.0–26.0)
BICARBONATE (VENOUS): 26 mmol/L (ref 22.0–26.0)
LACTATE: 1 mmol/L (ref 0.0–1.3)
LACTATE: 1.2 mmol/L (ref 0.0–1.3)
PCO2 (VENOUS): 41 mmHg (ref 41.00–51.00)
PCO2 (VENOUS): 39 mmHg — ABNORMAL LOW (ref 41.00–51.00)
PH (VENOUS): 7.43 — ABNORMAL HIGH (ref 7.31–7.41)
PH (VENOUS): 7.44 — ABNORMAL HIGH (ref 7.31–7.41)
PO2 (VENOUS): 37 mmHg (ref 35.0–50.0)
PO2 (VENOUS): 35 mmHg (ref 35.0–50.0)

## 2018-07-18 LAB — BASIC METABOLIC PANEL
ANION GAP: 7 mmol/L (ref 4–13)
ANION GAP: 7 mmol/L (ref 4–13)
BUN/CREA RATIO: 21 (ref 6–22)
BUN: 12 mg/dL (ref 8–25)
CALCIUM: 8.7 mg/dL (ref 8.5–10.2)
CHLORIDE: 103 mmol/L (ref 96–111)
CO2 TOTAL: 26 mmol/L (ref 22–32)
CREATININE: 0.56 mg/dL (ref 0.49–1.10)
ESTIMATED GFR: >60 mL/min/1.73mˆ2 (ref >60)
GLUCOSE: 107 mg/dL (ref 65–139)
POTASSIUM: 3.9 mmol/L (ref 3.5–5.1)
SODIUM: 136 mmol/L (ref 136–145)

## 2018-07-18 LAB — CBC
HCT: 24.1 % — ABNORMAL LOW (ref 34.8–46.0)
HGB: 7.7 g/dL — ABNORMAL LOW (ref 11.5–16.0)
MCH: 29.1 pg (ref 26.0–32.0)
MCHC: 32 g/dL (ref 31.0–35.5)
MCV: 90.9 fL (ref 78.0–100.0)
MPV: 9.8 fL (ref 8.7–12.5)
PLATELETS: 439 x10ˆ3/uL — ABNORMAL HIGH (ref 150–400)
RBC: 2.65 x10ˆ6/uL — ABNORMAL LOW (ref 3.85–5.22)
RDW-CV: 15.2 % (ref 11.5–15.5)
WBC: 11.1 x10ˆ3/uL — ABNORMAL HIGH (ref 3.7–11.0)

## 2018-07-18 LAB — MAGNESIUM: MAGNESIUM: 2 mg/dL (ref 1.6–2.6)

## 2018-07-18 LAB — PHOSPHORUS: PHOSPHORUS: 3.6 mg/dL (ref 2.4–4.7)

## 2018-07-18 MED ORDER — LACOSAMIDE 100 MG TABLET
200.00 mg | ORAL_TABLET | Freq: Two times a day (BID) | ORAL | Status: DC
Start: 2018-07-18 — End: 2018-08-08
  Administered 2018-07-18 – 2018-08-07 (×42): 200 mg via GASTROSTOMY
  Filled 2018-07-18 (×43): qty 2

## 2018-07-18 MED ADMIN — phenytoin sodium 50 mg/mL intravenous solution: INTRAVENOUS | @ 16:00:00

## 2018-07-18 MED ADMIN — cloBAZam 2.5 mg/mL oral suspension: GASTROSTOMY | @ 05:00:00

## 2018-07-18 MED ADMIN — nystatin 100,000 unit/mL oral suspension: ORAL | @ 20:00:00

## 2018-07-18 MED ADMIN — sodium chloride 0.9 % (flush) injection syringe: @ 06:00:00

## 2018-07-18 MED ADMIN — lactated Ringers intravenous solution: INTRAVENOUS | @ 05:00:00 | NDC 00338011704

## 2018-07-18 MED ADMIN — oxyCODONE 5 mg tablet: INTRAVENOUS | @ 04:00:00

## 2018-07-18 MED ADMIN — PREMIXED HEMODIALYSATE W/ ADDITIVES: GASTROSTOMY | @ 20:00:00

## 2018-07-18 MED ADMIN — lactated Ringers intravenous solution: INTRAVENOUS | @ 07:00:00 | NDC 00338011704

## 2018-07-18 NOTE — Care Plan (Signed)
[place]  Occupational Therapy Progress Note    Patient Name: [name]  Date of Birth: Sep 29, 1960  Height:  167.6 cm (5' 5.98")  Weight:  94.1 kg (207 lb 7.3 oz)  Room/Bed: 11/A  Payor: HEALTH PLAN / Plan: HEALTH PLAN (PPO-NON ST EMP) / Product Type: PPO /     Assessment:      [name] tolerated OT treatment session poorly this date. Patient with no eye opening, sputum secretions frequent during session. Patient also with improved muscle tone in RUE as compared to previous session, although resting UE in internal rotation and forearm pronation. Patient required DEP A for bed mobility, EOB sitting, and ADLs during session. Patient continues to benefit from skilled OT in SNF setting vs. LTACH placement upon discharge.      Discharge Needs:     Equipment Recommendation: to be determined    Discharge Disposition:  skilled nursing facility, long term acute care facility   The above recommendation is based upon the current examination and evaluation performed on this date. As subsequent sessions are completed, recommendations will be updated accordingly.    JUSTIFICATION OF DISCHARGE RECOMMENDATION   Based on current diagnosis, functional performance prior to admission, and current functional performance, this patient requires continued OT services in skilled nursing facility, long term acute care facility in order to achieve significant functional improvements.    Plan:   Continue to follow patient according to established plan of care.  The risks/benefits of therapy have been discussed with the patient/caregiver and he/she is in agreement with the established plan of care.     Subjective & Objective:        07/18/18 1106   Therapist Pager   OT Assigned/ Pager # Gearlean Alf [phone]   Rehab Session   Document Type therapy progress note (daily note)   Total OT Minutes: 26   Patient Effort poor   Symptoms Noted During/After Treatment fatigue   General Information   Patient  Profile Reviewed? yes   General Observations of Patient [name] was lethargic, supine in bed with HOB elevated upon arrival in patient room. RN approved session. Patient seen with professional assist of PT for clustered care. Patient required DEP A to don helmet prior to OOB activity.   Medical Lines PEG Tube;PIV Line;Telemetry   Respiratory Status aerosol trach collar   Existing Precautions/Restrictions aspiration precautions;fall precautions;full code;other (see comments)  (helmet on when OOB)   Pre Treatment Status   Pre Treatment Patient Status Patient supine in bed;Call light within reach;Nurse approved session;Venodynes in place and activated   Support Present Pre Treatment  None   Communication Pre Treatment  Nurse   Mutuality/Individual Preferences   Individualized Care Needs OOB via Maxi Move; encourage ROM of all extremities   Self-Care   Current Activity Tolerance poor   Vital Signs   Pre-Treatment Heart Rate (beats/min) 91   Post-treatment Heart Rate (beats/min) 97   Pre-Treatment Resp Rate (breaths/min) 27   Post-treatment Resp Rate (breaths/min) 29   Pre Treatment BP 112/74   Post Treatment BP 120/81   Pre SpO2 (%) 98   O2 Delivery Pre Treatment supplemental O2   Post SpO2 (%) 96   O2 Delivery Post Treatment supplemental O2   Pain Assessment   Pre/Post Treatment Pain Comment Patient with no indication of pain during session.   Coping/Psychosocial   Observed Emotional State calm;withdrawn;flat   Coping/Psychosocial Response Interventions   Plan Of Care Reviewed With patient  Supportive Measures goal setting facilitated;problem solving facilitated;positive reinforcement provided   Trust Relationship/Rapport care explained;choices provided;reassurance provided   Cognitive Assessment/Interventions   Behavior/Mood Observations lethargic;sad/withdrawn   Orientation Status unable/difficult to assess   Attention severe impairment   Follows Commands does not follow one step commands   Comment Difficult  to tell if patient was actively attempting to ab/adduct her L hip in bent position on bed.   Vision Assessment/Interventions   Visual Impairment/Limitations unable/difficult to assess  (no eye opening; no response/tracking with DEP A for eye open)   Mobility Assessment/Training   Additional Documentation Bed Mobility Assessment/Treatment (Group)   Mobility Comment Formal mobility not appropriate to assess at this time.   Bed Mobility Assessment/Treatment   Bed Mobility, Assistive Device draw sheet;Head of Bed Elevated   Supine-Sit Independence dependent (less than 25% patient effort);2 person assist required;verbal cues required   Sit to Supine, Independence dependent (less than 25% patient effort);2 person assist required;verbal cues required   Safety Issues cognitive deficits limit understanding;decreased use of arms for pushing/pulling;decreased use of legs for bridging/pushing;impaired trunk control for bed mobility   Impairments balance impaired;cognition impaired;coordination impaired;endurance;motor control impaired;muscle tone abnormal;postural control impaired;ROM decreased;sensory feedback impaired;strength decreased;vision impaired   Comment Patient tolerated sitting EOB FOR ~5 minutes with DEP A for postural and head support. Patient with no active attempt to elevate head agaist gravity. Patient required DEP A for positioning of BUEs at side to support self on EOB.   Roll Right Independence dependent (less than 25% patient effort);2 person assist required   Scoot/Bridge Independence dependent (less than 25% patient effort);2 person assist required;verbal cues required   Transfer Assessment/Treatment   Sit-Stand Independence not appropriate to assess;not tested   Stand-Sit Independence not appropriate to assess;not tested   Transfer Comment Patient tolerated EOB sitting this date, further mobility deferred until increased arousal/safety.   Lower Body Dressing Assessment/Training   Position supine      Independence Level  dependent (less than 25% patient effort)   Impairments activity tolerance impaired;balance impaired;cognition impaired;coordination impaired;motor control impaired;muscle tone abnormal;postural control impaired;ROM decreased;sensory feedback impaired;strength decreased;vision impaired   Comment Patient required total assistance to adjust socks prior to OOB activity.   Grooming Assessment/Training   Position supine   Independence Level dependent (less than 25% patient effort)   Impairments activity tolerance impaired;balance impaired;cognition impaired;coordination impaired;motor control impaired;muscle tone abnormal;postural control impaired;ROM decreased;sensory feedback impaired;strength decreased;vision impaired   Comment Patient required DEP HOH assist using LUE to complete grooming task to wash face while supine in bed with HOB elevated.   IADL Evaluation   IADL Comments Anticipate total assistance required for IADL tasks at this time.   Balance Skill Training   Sitting Balance: Static poor balance   Sitting, Dynamic (Balance) unable to balance   Post Treatment Status   Post Treatment Patient Status Patient supine in bed;Call light within reach;Venodynes in place and activated   Support Present Post Treatment  None   Occupational Therapy Clinical Impression   Functional Level at Time of Session Ms. Kashani tolerated OT treatment session poorly this date. Patient with no eye opening, sputum secretions frequent during session. Patient also with improved muscle tone in RUE as compared to previous session, although resting UE in internal rotation and forearm pronation. Patient required DEP A for bed mobility, EOB sitting, and ADLs during session. Patient continues to benefit from skilled OT in SNF setting vs. LTACH placement upon discharge.   Anticipated Equipment Needs at Discharge to be determined  Anticipated Discharge Disposition skilled nursing facility;long term acute care facility        Therapist:   Darcus Pester, OT  Pager #: [phone]

## 2018-07-18 NOTE — Care Plan (Signed)
[place]  Physical Therapy Treatment    Patient Name: [name]  Date of Birth: 1960/12/14  Height: Height: 167.6 cm (5' 5.98")  Weight: Weight: 94.1 kg (207 lb 7.3 oz)  Room/Bed: 11/A  Payor: HEALTH PLAN / Plan: HEALTH PLAN (PPO-NON ST EMP) / Product Type: PPO /       Assessment:      (P) [name] was less responsive today to being brought up to edge of bed sitting, with no active response to hold her head up or assist with extension of trunk. Although she did demonstrate capacity to independently hold her LLE in a flexed position up against gravity, she did not demonstrate any active movement LUE or LE spontaneously or in response to stimuli.     Discharge Needs:    Equipment Recommendation: (P) TBD   Discharge Disposition: (P) TBD    JUSTIFICATION OF DISCHARGE RECOMMENDATION   Based on current diagnosis, functional performance prior to admission, and current functional performance, this patient requires continued PT services in (P) TBD in order to achieve significant functional improvements in these deficit areas: (P) aerobic capacity/endurance, arousal, attention, and cognition, gait, locomotion, and balance, neuromuscular, ventilation and respiration/gas exchange.    Plan:   Current Intervention:    To provide physical therapy services (P) minimum of 2x/week  for duration of (P) until discharge.    The risks/benefits of therapy have been discussed with the patient/caregiver and he/she is in agreement with the established plan of care.       Past Medical History:   Diagnosis Date   . Anxiety    . Arthropathy    . Cancer (CMS HCC)     brain tumor   . Dyspnea on exertion    . Esophageal reflux     does not take meds   . Heart murmur     benign   . HTN (hypertension)    . Hyperlipemia    . Muscle weakness     right sided weakness   . Palpitations    . Shortness of breath    . Wears glasses     reading         Past Surgical History:   Procedure Laterality Date   .  HX APPENDECTOMY     . HX BREAST AUGMENTATION Bilateral    . HX LAP CHOLECYSTECTOMY     . HX LUMBAR DISKECTOMY  1999   . HX OTHER Right     benign tumor removed from upper right leg x3   . HX OTHER      exploratory lap   . HX TUBAL LIGATION            reports that she has been smoking cigarettes. She has a 30.00 pack-year smoking history. She has never used smokeless tobacco. She reports that she does not drink alcohol or use drugs.  Social History     Tobacco Use   Smoking Status Current Every Day Smoker   . Packs/day: 1.00   . Years: 30.00   . Pack years: 30.00   . Types: Cigarettes   Smokeless Tobacco Never Used   Tobacco Comment    down to 8 cigarettes, counseled on 1-800-QUIT_NOW         Subjective & Objective        07/18/18 1107   Therapist Pager   PT Assigned/ Pager # [phone]   Rehab Session   Document Type therapy progress note (daily note)  Total PT Minutes: 26   Patient Effort poor   Symptoms Noted During/After Treatment fatigue   General Information   Patient Profile Reviewed? yes   Medical Lines PEG Tube;PIV Line;Telemetry   Respiratory Status aerosol trach collar   Existing Precautions/Restrictions aspiration precautions;fall precautions;full code;other (see comments)  (Lcraniectomy, no bone flap: helmet on when up)   Mutuality/Individual Preferences   Individualized Care Needs Maxi move and lateral supports with up to chair   Self-Care   Current Activity Tolerance poor   Pre Treatment Status   Pre Treatment Patient Status Patient supine in bed;Nurse approved session;Venodynes in place and activated;Sitter select activated   Support Present Pre Treatment  None   Cognitive Assessment/Interventions   Behavior/Mood Observations unable to arouse;lethargic   Orientation Status unable/difficult to assess   Attention severe impairment   Follows Commands does not follow one step commands   Vital Signs   Pre-Treatment Heart Rate (beats/min) 94   Post-treatment Heart Rate (beats/min) 97   Pre-Treatment Resp  Rate (breaths/min) 26   Post-treatment Resp Rate (breaths/min) 28   Pre Treatment BP 112/74   Post Treatment BP 120/81   Pre SpO2 (%) 98   O2 Delivery Pre Treatment supplemental O2   Post SpO2 (%) 96   O2 Delivery Post Treatment supplemental O2   Pain Assessment   Pre/Post Treatment Pain Comment No behaviors suggested pain   Vision Assessment/Interventions   Visual Impairment/Limitations   (No convergence, visual fixation or tracking w/eyes held open)   Additional Documentation   (No eye opening spontaneously or to stimulus)   General Extremity Assessment   Comment Decreased ( i.e, more normal ) tone both UEs and more normal tone LLE and holds stable in hooklying for several minutes with no assistance once placed. May have move LLE briefly into IR/ER in hooklying to command    Bed Mobility Assessment/Treatment   Bed Mobility, Assistive Device draw sheet;other (see comments)  (Boost mode on bed)   Supine-Sit Independence 2 person assist required;dependent (less than 25% patient effort)   Sit to Supine, Independence 2 person assist required;dependent (less than 25% patient effort)   Safety Issues cognitive deficits limit understanding;decreased use of arms for pushing/pulling;decreased use of legs for bridging/pushing;impaired trunk control for bed mobility   Impairments balance impaired;cognition impaired;motor control impaired;muscle tone abnormal;sensory feedback impaired;strength decreased;other (see comments);postural control impaired;sensation decreased   Comment Patient was brought dependently up to sitting with BLEs dangling off EOB, helmet on t/i, continuous max support to trunk and neck extension, no spontanueus activity to prop from either UE , and today she did not exhibit any active response to attempt to hold head up or trunk up against gravity , and she was returned to supine after a 5 min seated interval   Scoot/Bridge Independence 2 person assist required;dependent (less than 25% patient effort)      Balance Skill Training   Sitting Balance: Static unable to balance   Sitting, Dynamic (Balance) unable to balance   Systems Impairment Contributing to Balance Disturbance cognitive;neuromuscular;visual;somatosensory   Identified Impairments Contributing to Balance Disturbance impaired coordination;impaired motor control;abnormal muscle tone;impaired postural control;decreased ROM;decreased strength;impaired sensory feedback   Therapeutic Exercise/Activity   Comment PROM x 4 limbs   Post Treatment Status   Post Treatment Patient Status Patient supine in bed;Sitter select activated   Support Present Post Treatment  None   Plan of Care Review   Plan Of Care Reviewed With patient   Basic Mobility Am-PAC/6Clicks Score   Turning in bed  without bedrails 1   Lying on back to sitting on edge of flat bed 1   Moving to and from a bed to a chair 1   Standing up from chair 1   Walk in room 1   Climbing 3-5 steps with railing 1   6 Clicks Raw Score total 6   Standardized (t-scale) score 16.59   CMS 0-100% Score 100   CMS Modifier CN   Patient Mobility Goal [facility]) 4- Move to chair 3X/day   Exercise/Activity Level Performed 3- Sat at edge of bed   Physical Therapy Clinical Impression   Assessment [name] was less responsive today to being brought up to edge of bed sitting, with no active response to hold her head up or assist with extension of trunk. Although she did demonstrate capacity to independently hold her LLE in a flexed position up against gravity, she did not demonstrate any active movement LUE or LE spontaneously or in response to stimuli.    Criteria for Skilled Therapeutic meets criteria   Pathology/Pathophysiology Noted neuromuscular;other (see comments)  (vision, somatosensory, cognition)   Impairments Found (describe specific impairments) aerobic capacity/endurance;arousal, attention, and cognition;gait, locomotion, and balance;neuromuscular;ventilation and respiration/gas exchange   Functional Limitations in  Following  self-care;home management;work;community/leisure   Disability: Inability to Perform work;community/leisure   Rehab Potential fair, will monitor progress closely   Therapy Frequency minimum of 2x/week   Predicted Duration of Therapy Intervention (days/wks) until discharge   Anticipated Equipment Needs at Discharge (PT) TBD   Anticipated Discharge Disposition TBD         Therapist:   Cherlyn Labella, PT   Pager #: [phone]

## 2018-07-18 NOTE — Care Plan (Signed)
[name] was done well on 28% ATC all day.  Saturations were within normal limits with no increased WOB.  Patient has clear/diminished BBS with strong, productive cough.  Ventilator for PSV +11/29/26% is on stand-by.  Patient will stay on ATC as long as tolerated.  RT will continue to follow and monitor.

## 2018-07-18 NOTE — Nurses Notes (Signed)
[facility] notified of patient withdrawing with RUE and unequal pupils with one being sluggish per pupillometer.  GCS 1/4/1.  No new orders at this time.  Will continue to monitor.

## 2018-07-18 NOTE — Care Plan (Signed)
Problem: Adult Inpatient Plan of Care  Goal: Patient-Specific Goal (Individualization)  Outcome: Ongoing (see interventions/notes)  Flowsheets (Taken 07/18/2018 0439)  Patient-Specific Goals (Include Timeframe): Wean off Versed today    GCS 1-5-1. No acute events. Monitoring continues.

## 2018-07-18 NOTE — Care Plan (Signed)
Pt remains on the ventilator in the PSV mode with the following settings, PS 8, PEEP 5 and 30%. PT to come off vent to an ATC during the day. respiratory to follow and wean pt as tolerated

## 2018-07-18 NOTE — Nurses Notes (Signed)
[facility] notified of patient's LUE twitching.  Service to discuss if EEG should be restarted.  Will continue to monitor.

## 2018-07-18 NOTE — Progress Notes (Signed)
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  Afebrile overnight, hgb stable.       Vital Signs:  Temp (24hrs) Max:37.6 C (996.2F)      Systolic (222LNL, AGXQ:119, Min:95 , MERD:408    Diastolic (214GYJ, AEHU:31 Min:61, Max:87    Temp  Avg: 37.4 C (99.3 F)  Min: 37.1 C (98.8 F)  Max: 37.6 C (99.7 F)  MAP (Non-Invasive)  Avg: 86.7 mmHG  Min: 73 mmHG  Max: 100 mmHG  Pulse  Avg: 95.6  Min: 89  Max: 104  Resp  Avg: 23.2  Min: 16  Max: 30  SpO2  Avg: 96.4 %  Min: 93 %  Max: 99 %  Pain Score (Numeric, Faces): 3    Current Medications:  acetaminophen (TYLENOL) 160 mg per 5 mL liquid - grape flavored, 650 mg, Gastric (NG, OG, PEG, GT), Q4H PRN  ascorbic acid (VITAMIN C) tablet, 500 mg, Gastric (NG, OG, PEG, GT), 2x/day  bacitracin 500 units/gram topical ointment tube, , Topical, 2x/day  banana flakes (BANATROL PLUS) packet, 1 Packet, Gastric (NG, OG, PEG, GT), Q8HRS  carvedilol (COREG) tablet, 12.5 mg, Gastric (NG, OG, PEG, GT), 2x/day-Food  cefTRIAXone (ROCEPHIN) 1 g in D5W 50 mL IVPB, 1 g, Intravenous, Q24H  chlorhexidine gluconate (PERIDEX) 0.12% mouthwash, 15 mL, Swish & Spit, 2x/day  cloBAZam (ONFI) 2.5 mg per mL oral liquid, 10 mg, Gastric (NG, OG, PEG, GT), Q8HRS  enoxaparin PF (LOVENOX) 40 mg/0.4 mL SubQ injection, 40 mg, Subcutaneous, Q24H  famotidine (PEPCID) tablet, 20 mg, Gastric (NG, OG, PEG, GT), 2x/day  fentaNYL (SUBLIMAZE) 50 mcg/mL injection, 25 mcg, Intravenous, Q1H PRN  hydrALAZINE (APRESOLINE) injection 10 mg, 10 mg, Intravenous, Q4H PRN  labetalol (TRANDATE) 5 mg/mL injection, 10 mg, Intravenous, Q1H PRN  lacosamide (VIMPAT) 200  mg/218minjection, 200 mg, Intravenous, 2x/day  lanolin-oxyquin-pet, hydrophil (BAG BALM) topical ointment, , Apply Topically, 2x/day PRN  levETIRAcetam (KEPPRA) tablet, 1,500 mg, Gastric (NG, OG, PEG, GT), 2x/day  lisinopril (PRINIVIL) tablet, 40 mg, Gastric (NG, OG, PEG, GT), Daily  midazolam (VERSED) 100 mg in NS 100 mL premix infusion, 5 mg/hr, Intravenous, Continuous  nicotine (NICODERM CQ) transdermal patch (mg/24 hr), 14 mg, Transdermal, Daily  NS flush syringe, 2 mL, Intracatheter, Q8HRS    And  NS flush syringe, 2-6 mL, Intracatheter, Q1 MIN PRN  NS flush syringe, 10-30 mL, Intracatheter, Q8HRS  NS flush syringe, 20-30 mL, Intracatheter, Q1 MIN PRN  nutrition protein supplement 15 g per 30 mL packet, 1 Packet, Gastric (NG, OG, PEG, GT), 2x/day  nystatin (MYCOSTATIN) 100,000 units per mL oral liquid, 5 mL, Swish & Spit, 4x/day  nystatin (NYSTOP) 100,000 units/g topical powder, , Apply Topically, 2x/day  ondansetron (ZOFRAN) 2 mg/mL injection, 4 mg, Intravenous, Q6H PRN  perflutren lipid microspheres (DEFINITY) 1.3 mL in NS 10 mL (tot vol) injection, 2 mL, Intravenous, Give in Cardiology  phenytoin (DILANTIN) 50 mg/mL injection, 100 mg, Intravenous, Q8H  potassium bicarbonate-citric acid (EFFER-K) effervescent tablet, 40 mEq, Gastric (NG, OG, PEG, GT), Daily with Breakfast  SSIP insulin lispro (HUMALOG) 100 units/mL injection, 0-12 Units, Subcutaneous,  Q4H PRN      Today's Physical Exam:   General: Acutely ill  Neurologic: E1 M5 V1T: does not open eyes, intubated (on low dose versed gtt).  PERRL, no facial weakness, + cough.  Motor: LUE localizes, tries to localize RUE but does not cross midline; BLE withdraw. UTA sensation  Cardiovascular: Heart regular rate and rhythm  Lungs: CTAB  Abdomen: soft, non-tender and bowel sounds normal  Extremities: no cyanosis or edema  Skin: Skin warm and dry    Labs:  I have reviewed all lab results.    I/O:  I/O last 24 hours:      Intake/Output Summary (Last 24 hours) at  07/18/2018 0610  Last data filed at 07/18/2018 0600  Gross per 24 hour   Intake 2178 ml   Output 490 ml   Net 1688 ml     I/O current shift:  12/25 1900 - 12/26 0659  In: 503 [P.O.:400; I.V.:57; TW:656]  Out: -     Drips:   Current Facility-Administered Medications   Medication Dose Frequency Last Rate   . midazolam (VERSED) 100 mg in NS 100 mL premix infusion  5 mg/hr Continuous 1 mg/hr (07/18/18 0700)         Radiology Results:    CT brain 07/11/18: "1.  Stable postoperative changes from left decompressive craniectomy with persistent extracalvarial brain herniation.  2.  Persistent cerebral edema and hemorrhagic tumor resection site. No new  Hemorrhage."    MRI brain w/wo contrast 07/06/18: "acute postoperative changes related to gross total resection of a left medial tentorial meningioma with previously seen mass effect on the left thalamus, left middle cerebral peduncle and left midbrain. The current examination demonstrate hemorrhagic products are identified within the resection cavity extending into the parenchyma of the left thalamus and tracking down within the left midbrain and extending to the fourth Ventricle."        Impression and Plan:    Active Hospital Problems    Diagnosis   . 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 Tobacco use disorder   . Hypokalemia     57 year old female with progressive R sided weakness s/p left meningioma resection s/p Craniectomy for evacuation of tumor bed hematoma.       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  -vEEG  -Current  AEDs   -Keppra 1500 BID   -Vimpat 200 BID   -Phenytoin 100 TID- reloaded overnight   -Onfi 92m TID   -Versed gtt currently at 283m hr- continue to wean down by 3m67mer hour    Pain/sedation  -- Tylenol PRN, none overnight    Cardiovascular:    HTN, chronic  -- SBP goal < 160  -- Hydralazine/labetalol PRN- none overnight  -- Current antihypertensives:  -- Coreg 12.5 BID   -- Lisinopril 22m39mily    Respiratory:    Acute respiratory failure requiring mechanical ventilation s/p tracheostomy   Ventilator Settings:  Set PEEP: 5 cmH2O  Pressure Support: 8 cmH2O  FiO2: 28 %  -- CXR 12/23 Well-circumscribed nodular opacity overlying the  right lung base  -- Recommenced CT Chest as outpatient    Renal: No acute issues  -Daily BMP    Infectious Disease: leucocytosis, stable, afebrile  -- Cultures:    -- Blood, urine, MRSA, and BAL negative    -- Completed on cefepime 12/23  -Procal 12/23 - <0.05  -Urine Cx 12/23 - Candida albicans, replaced foley cath  -Urine Cx 12/24: Candida Albicans, currently on Rocephin x 5days for complicated UTI  -Repeat BC and BAL 12/25- BC pending, BAL ngtd    Hematology:Normocytic anemia   -1u pRBCs 12/23 for hbg 6.8  -continue to monitor and transfuse if hb<7    DVT prophylaxis  - SCD's / lovenox     LLE DVT  Peripheral Venous Duplex 07/16/18: LLE DVT (calf vein)  --repeat in 1 week / 07/23/18 (ordered)    Gastrointestinal: Diarrhea   - C. Diff 12/23 - neg  - Banana flakes started 12/23  - last BM 12/25  - holding miralax/senna  - DC pepcid  - LFTs WDL    Nutrition  - Osmolite 1.5 5 cans/day with FWF 200 Q6  - Vit C  - Protein packet    Endocrine:  No acute issues  -- SSIP conservative  -- Glucose 91-126    Integumentary: Excoriation in inner thigh area  -Bag balm as needed  -Nystatin powder BID    Psychiatric:  No acute issues  -- Monitor for ICU delirium     Prophylaxis:    DVT/PE Prophylaxis: SCDs/ Venodynes/Impulse boots  GI: DC Famotidine   Mouth: Peridex    Family:  None present at  bedside  Disposition/ Baseline:  ICU  Advance Directives:  None-Discussed  Hospice involvement prior to admission?  no    Code Status Information     Code Status    Full Code              Roel Cluck, PA-C 07/18/2018   NeuroCritical Care Unit    See Resident or Midlevel's Note for further details.    Critical Care Attestation  I have reviewed the resident/ midlevel's note. I agree with their findings and/or have made additions/ edits as well as my findings above.    I was present at the bedside of this critically ill patient for59mnutes exclusive of procedures that are documented elsewhere. My services were independent and non-duplicative of other practioners of other specialties (non-Neurocritical Care).    This patient suffers from failure or dysfunction of Neurological, respiratorysystem(s).   Neurological-  Subclinical generalized status epilepticus-resolved, versed infusion (titrated to sz suppression). Continue VIM, PHT, Onfi. Continue vEEG  L tentorial meningioma s/p resection with post-operative hemorrhage s/p L DHC, stable on repeat neuroimaging. Continue q2hrs neurochecks. PT/OT.  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  Respiratory-Acute respiratory failure requiring mechanical ventilations/p trach/PEG.Copious oral secretions, minimal inline. Doing well on ATC since 730am.    R lung nodule - incidental finding; work up with CT chest as an outpatient  Infectious disease-Mild leukocytosis, numerous high-grade fevers have resolved. UA was positive but culture only grew Candida again and in absence of any systemic signs of sepsis, d/c ceftriaxone; do not plan on starting anti-fungal treatment in absence of clinical signs. Completed cefepime 12/22 empirically for PNA. Fevers maybe central or due toDVT/superficial thrombosis and trend has  improved.  Gastrointestinal-Diarrhea-resolved.C. Diffnegative. D/cbanana flakes. Tolerating TEN. LFTs ok.  Renal-No acute issues  Hematological-Subacute anemia, drifted below 7 and  received PRBC transfusion with appropriate response. Continue lovenox, SCDs  Superficial R cephalic thrombosis and L peroneal occlusive DVT. Repeat LE duplex in 1 week to assess propagation, no indication for treatment at this time. No indication for PICC at this time.  The care of this patient was in regard to managing (a) conditions(s) that has a high probability of sudden, clinically significant, or life-threatening deterioration and required a high degree of Attending Physician attention and direct involvement to intervene urgently. Data review and care planning was performed in direct proximity of the patient, examination was obviously performed in direct contact with the patient. All of this time was exclusive of procedure which will be documented elsewhere in the chart.   My critical care time involved full attention to the patient's condition and included:   Review of nursing notes and/or old charts   Review of medications, allergies, and vital signs   Documentation time   Consultant collaboration on findings and treatment options   Care, transfer of care, and discharge plans   Ordering, interpreting, and reviewing diagnostic studies/ lab tests   Obtaining necessary history from family, EMS, nursing home staff and/or treating physicians     My critical care time did not include time spent teaching resident physician(s) or other services of resident physicians, or performing other reported procedures.     Total Critical Care Time:79mnutes

## 2018-07-18 NOTE — Care Plan (Signed)
VENTILATOR - CPAP(PS) / SPONTANEOUS CONTINUOUS    Discontinue   Duration: Until Specified    Priority: Routine       Question Answer Comment   FIO2 (%) 28     Peep(cm/H2O) 5     Pressure Support(cm/H2O) 10     Indications IMPROVE DISTRIBUTION OF VENTILATION     Invasive/Non-Invasive INVASIVE           Ms. Merkle is currently on above ventilator settings.  FiO2 was weaned to 28%.  Patient tolerated ATC 28% well for most of the day.  Was placed back on ventilator for BAL procedure and central line replacement.  ABG today was alkalotic.  Will repeat and monitor VBG trends.  Patient will be placed on ATC 28% as tolerated.  RT will continue to follow and wean ventilator settings.

## 2018-07-18 NOTE — Care Plan (Signed)
Patient s/p crani for tumor resection.  Trach care provided; ATC continued.  GCS 1/5/1.  SBP maintained <160.  Patient does not to appear to be in pain.  Enteral nutrition continued as ordered.  Versed gtt off; continuous EEG reordered.  Skin precautions maintained with turn Q2 and mepilexes.  Safety precautions maintained with hourly rounding.  Plan for continuous EEG, MRI, and ATC.     Patient Goals Based on Modifiable Risk Factors    The following modifiable risk factors were discussed with patient:     Hypertension    Goal:   Maintain blood pressure less than 140/90 (or less than 130/90 for patients with diabetes or chronic kidney disease).    Patient's selected lifestyle modification(s):  Routinely take antihypertensive medications as prescribed    Patient/Family/Caregiver response to plan:    Patient has a trach and shows no evidence of learning.  Will continue to monitor for educational opportunities.       Problem: Adult Inpatient Plan of Care  Goal: Plan of Care Review  Outcome: Ongoing (see interventions/notes)  Flowsheets (Taken 07/18/2018 1512)  Plan of Care Reviewed With: patient; family  Goal: Patient-Specific Goal (Individualization)  Outcome: Ongoing (see interventions/notes)  Flowsheets (Taken 07/18/2018 1512)  Individualized Care Needs: frequent oral care  Anxieties, Fears or Concerns: Per family, "Why did you guys turn off the machine when she just got off the medication?"  Patient-Specific Goals (Include Timeframe): Per family, "To stay seizure free today"  Goal: Absence of Hospital-Acquired Illness or Injury  Outcome: Ongoing (see interventions/notes)  Goal: Optimal Comfort and Wellbeing  Outcome: Ongoing (see interventions/notes)  Goal: Rounds/Family Conference  Outcome: Ongoing (see interventions/notes)  Flowsheets (Taken 07/18/2018 1512)  Participants: family; advanced practice nurse; nursing; patient; pharmacy; physical therapy; physician; respiratory therapy     Problem: Communication  Impairment (Mechanical Ventilation, Invasive)  Goal: Effective Communication  Outcome: Ongoing (see interventions/notes)     Problem: Device-Related Complication Risk (Mechanical Ventilation, Invasive)  Goal: Optimal Device Function  Outcome: Ongoing (see interventions/notes)     Problem: Inability to Wean (Mechanical Ventilation, Invasive)  Goal: Mechanical Ventilation Liberation  Outcome: Ongoing (see interventions/notes)     Problem: Nutrition Impairment (Mechanical Ventilation, Invasive)  Goal: Optimal Nutrition Delivery  Outcome: Ongoing (see interventions/notes)     Problem: Skin and Tissue Injury (Mechanical Ventilation, Invasive)  Goal: Absence of Device-Related Skin and Tissue Injury  Outcome: Ongoing (see interventions/notes)     Problem: Ventilator-Induced Lung Injury (Mechanical Ventilation, Invasive)  Goal: Absence of Ventilator-Induced Lung Injury  Outcome: Ongoing (see interventions/notes)     Problem: Fall Injury Risk  Goal: Absence of Fall and Fall-Related Injury  Outcome: Ongoing (see interventions/notes)     Problem: Skin Injury Risk Increased  Goal: Skin Health and Integrity  Outcome: Ongoing (see interventions/notes)     Problem: Hypertension Comorbidity  Goal: Blood Pressure in Desired Range  Outcome: Ongoing (see interventions/notes)     Problem: Depression  Goal: Improved Mood  Outcome: Ongoing (see interventions/notes)     Problem: Feeding Intolerance (Enteral Nutrition)  Goal: Feeding Tolerance  Outcome: Ongoing (see interventions/notes)     Problem: Bleeding (Craniotomy/Craniectomy/Cranioplasty)  Goal: Absence of Bleeding  Outcome: Ongoing (see interventions/notes)     Problem: Cerebral Tissue Perfusion Risk (Craniotomy/Craniectomy/Cranioplasty)  Goal: Effective Cerebral Tissue Perfusion  Outcome: Ongoing (see interventions/notes)     Problem: Fluid Imbalance (Craniotomy/Craniectomy/Cranioplasty)  Goal: Fluid Balance  Outcome: Ongoing (see interventions/notes)     Problem: Infection  (Craniotomy/Craniectomy/Cranioplasty)  Goal: Absence of Infection Signs/Symptoms  Outcome: Ongoing (  see interventions/notes)     Problem: Pain (Craniotomy/Craniectomy/Cranioplasty)  Goal: Acceptable Pain Control  Outcome: Ongoing (see interventions/notes)     Problem: Bleeding (Surgery Nonspecified)  Goal: Absence of Bleeding  Outcome: Ongoing (see interventions/notes)     Problem: Bowel Elimination Impaired (Surgery Nonspecified)  Goal: Effective Bowel Elimination  Outcome: Ongoing (see interventions/notes)     Problem: Infection (Surgery Nonspecified)  Goal: Absence of Infection Signs/Symptoms  Outcome: Ongoing (see interventions/notes)     Problem: Pain (Surgery Nonspecified)  Goal: Acceptable Pain Control  Outcome: Ongoing (see interventions/notes)     Problem: Communication Impairment (Artificial Airway)  Goal: Effective Communication  Outcome: Ongoing (see interventions/notes)     Problem: Device-Related Complication Risk (Artificial Airway)  Goal: Optimal Device Function  Outcome: Ongoing (see interventions/notes)     Problem: Skin and Tissue Injury (Artificial Airway)  Goal: Absence of Device-Related Skin or Tissue Injury  Outcome: Ongoing (see interventions/notes)     Problem: Seizure, Active Management  Goal: Absence of Seizure/Seizure-Related Injury  Outcome: Ongoing (see interventions/notes)

## 2018-07-18 NOTE — Nurses Notes (Signed)
NCCU notified of patient not localizing with LUE like previous assessments.  Patient's GCS 1/4/1.  Orders to recheck neuro assessment in one hour.  Will continue to monitor.

## 2018-07-18 NOTE — Care Management Notes (Signed)
[facility]  Care Management Note    Patient Name: [name]  Date of Birth: 1960-12-02  Sex: female  Date/Time of Admission: 07/05/2018  7:26 AM  Room/Bed: 11/A  Payor: HEALTH PLAN / Plan: HEALTH PLAN (PPO-NON ST EMP) / Product Type: PPO /    LOS: 13 days   Primary Care Providers:  [name] Caddy, MD, MD (General)    Admitting Diagnosis:  Meningioma (CMS [facility]) [D32.9]    Assessment:      07/18/18 1557   Assessment Details   Assessment Type Continued Assessment   Date of Care Management Update 07/18/18   Date of Next DCP Update 07/19/18   Care Management Plan   Discharge Planning Status plan in progress   Projected Discharge Date 07/22/18   Discharge Needs Assessment   Discharge Facility/Level of Care Needs SNF vs Rehab   Patient not medically ready for discharge at this time.  Patient had seizure last evening and has continuous EEG monitor on.  She was placed back on ventilator last evening.  Monitoring respiratory status(at present patient on ATC 28%).    Discharge Plan:  LTAC (code 39), SNF vs Rehab   [place] sent task to check LTACH benefits as PT/OT recommending either LTACH or SNF.  CCC will reach out to patient's daughters tomorrow to discuss SNF/LTACH.    The patient will continue to be evaluated for developing discharge needs.     Case Manager: Claudius Sis, [place] COORDINATOR  Phone: [phone]

## 2018-07-18 NOTE — Nurses Notes (Signed)
Patient ordered MRI, cleared with nurse and daughter. She also had an MRI on 07/06/18 and had no issues with it.

## 2018-07-18 NOTE — Nurses Notes (Signed)
NCCU notified of both pupils reacting sluggish according to the pupillometer.  No new orders at this time.  Will continue to monitor.

## 2018-07-19 ENCOUNTER — Inpatient Hospital Stay (HOSPITAL_COMMUNITY): Payer: PPO

## 2018-07-19 DIAGNOSIS — D329 Benign neoplasm of meninges, unspecified: Secondary | ICD-10-CM

## 2018-07-19 LAB — PHOSPHORUS: PHOSPHORUS: 3.6 mg/dL (ref 2.4–4.7)

## 2018-07-19 LAB — BRONCHOALVEOLAR LAVAGE (BAL) QUANTITATIVE CULTURE/GRAM STAIN
BRONCHOALVEOLAR LAVAGE (BAL) QUANTITATIVE CULTURE: 1000
BRONCHOALVEOLAR LAVAGE (BAL) QUANTITATIVE CULTURE: 15000 — AB
GRAM STAIN: NONE SEEN

## 2018-07-19 LAB — BASIC METABOLIC PANEL
ANION GAP: 11 mmol/L (ref 4–13)
BUN/CREA RATIO: 23 — ABNORMAL HIGH (ref 6–22)
BUN: 13 mg/dL (ref 8–25)
CALCIUM: 8.8 mg/dL (ref 8.5–10.2)
CHLORIDE: 101 mmol/L (ref 96–111)
CO2 TOTAL: 23 mmol/L (ref 22–32)
CREATININE: 0.57 mg/dL (ref 0.49–1.10)
ESTIMATED GFR: >60 mL/min/1.73mˆ2 (ref >60)
GLUCOSE: 120 mg/dL (ref 65–139)
GLUCOSE: 120 mg/dL (ref 65–139)
POTASSIUM: 3.8 mmol/L (ref 3.5–5.1)
SODIUM: 135 mmol/L — ABNORMAL LOW (ref 136–145)

## 2018-07-19 LAB — CBC
HCT: 27.2 % — ABNORMAL LOW (ref 34.8–46.0)
HGB: 8.8 g/dL — ABNORMAL LOW (ref 11.5–16.0)
MCH: 29.2 pg (ref 26.0–32.0)
MCHC: 32.4 g/dL (ref 31.0–35.5)
MCHC: 32.4 g/dL (ref 31.0–35.5)
MCV: 90.4 fL (ref 78.0–100.0)
MPV: 10.5 fL (ref 8.7–12.5)
PLATELETS: 425 x10ˆ3/uL — ABNORMAL HIGH (ref 150–400)
RBC: 3.01 x10ˆ6/uL — ABNORMAL LOW (ref 3.85–5.22)
RDW-CV: 15.1 % (ref 11.5–15.5)
WBC: 12.6 x10?3/uL — ABNORMAL HIGH (ref 3.7–11.0)
WBC: 12.6 x10ˆ3/uL — ABNORMAL HIGH (ref 3.7–11.0)

## 2018-07-19 LAB — MAGNESIUM: MAGNESIUM: 1.9 mg/dL (ref 1.6–2.6)

## 2018-07-19 LAB — POC BLOOD GLUCOSE (RESULTS)
GLUCOSE, POC: 110 mg/dL — ABNORMAL HIGH (ref 70–105)
GLUCOSE, POC: 164 mg/dL — ABNORMAL HIGH (ref 70–105)
GLUCOSE, POC: 175 mg/dL — ABNORMAL HIGH (ref 70–105)

## 2018-07-19 LAB — PHENYTOIN, FREE: FREE PHENYTOIN LEVEL: 1.9 ug/mL (ref 1.0–2.0)

## 2018-07-19 MED ORDER — LORAZEPAM 2 MG/ML INJECTION SOLUTION
2.0000 mg | INTRAMUSCULAR | Status: AC
Start: 2018-07-20 — End: 2018-07-19
  Administered 2018-07-19: 2 mg via INTRAVENOUS

## 2018-07-19 MED ORDER — LORAZEPAM 2 MG/ML INJECTION SOLUTION
2.0000 mg | INTRAMUSCULAR | Status: AC
Start: 2018-07-19 — End: 2018-07-20
  Administered 2018-07-19: 2 mg via INTRAVENOUS

## 2018-07-19 MED ORDER — INSULIN LISPRO 100 UNIT/ML SUB-Q SSIP
0.00 [IU] | INJECTION | SUBCUTANEOUS | Status: DC | PRN
Start: 2018-07-19 — End: 2018-08-08
  Administered 2018-07-19 – 2018-08-01 (×6): 2 [IU] via SUBCUTANEOUS
  Administered 2018-08-06: 0 [IU] via SUBCUTANEOUS
  Administered 2018-08-06: 2 [IU] via SUBCUTANEOUS
  Administered 2018-08-07 (×2): 0 [IU] via SUBCUTANEOUS
  Filled 2018-07-19: qty 3

## 2018-07-19 MED ORDER — SULFAMETHOXAZOLE 800 MG-TRIMETHOPRIM 160 MG TABLET
1.0000 | ORAL_TABLET | Freq: Two times a day (BID) | ORAL | Status: DC
Start: 2018-07-19 — End: 2018-07-21
  Administered 2018-07-19 – 2018-07-21 (×4): 160 mg via GASTROSTOMY
  Filled 2018-07-19 (×6): qty 1

## 2018-07-19 MED ORDER — LORAZEPAM 2 MG/ML INJECTION SOLUTION
INTRAMUSCULAR | Status: AC
Start: 2018-07-19 — End: 2018-07-20
  Filled 2018-07-19: qty 1

## 2018-07-19 MED ORDER — POTASSIUM BICARBONATE-CITRIC ACID 20 MEQ EFFERVESCENT TABLET
20.0000 meq | EFFERVESCENT_TABLET | ORAL | Status: DC
Start: 2018-07-19 — End: 2018-07-19

## 2018-07-19 MED ORDER — LORAZEPAM 2 MG/ML INJECTION SOLUTION
INTRAMUSCULAR | Status: AC
Start: 2018-07-19 — End: 2018-07-20
  Administered 2018-07-20: 2 mg via INTRAVENOUS
  Filled 2018-07-19: qty 1

## 2018-07-19 MED ORDER — LORAZEPAM 2 MG/ML INJECTION SOLUTION
2.0000 mg | INTRAMUSCULAR | Status: AC
Start: 2018-07-19 — End: 2018-07-19
  Administered 2018-07-19: 2 mg via INTRAVENOUS
  Filled 2018-07-19: qty 1

## 2018-07-19 MED ORDER — CLOBAZAM 2.5 MG/ML ORAL SUSPENSION
20.00 mg | Freq: Three times a day (TID) | ORAL | Status: DC
Start: 2018-07-19 — End: 2018-08-08
  Administered 2018-07-19 – 2018-07-21 (×8): 20 mg via GASTROSTOMY
  Administered 2018-07-22 (×2): 10 mg via GASTROSTOMY
  Administered 2018-07-22 – 2018-08-07 (×50): 20 mg via GASTROSTOMY
  Filled 2018-07-19: qty 1
  Filled 2018-07-19: qty 7
  Filled 2018-07-19 (×4): qty 8
  Filled 2018-07-19: qty 2
  Filled 2018-07-19: qty 8
  Filled 2018-07-19: qty 7
  Filled 2018-07-19 (×3): qty 8
  Filled 2018-07-19: qty 3
  Filled 2018-07-19: qty 7
  Filled 2018-07-19: qty 1
  Filled 2018-07-19: qty 8
  Filled 2018-07-19: qty 4
  Filled 2018-07-19 (×2): qty 8
  Filled 2018-07-19: qty 7
  Filled 2018-07-19: qty 1
  Filled 2018-07-19: qty 4
  Filled 2018-07-19: qty 8
  Filled 2018-07-19: qty 1
  Filled 2018-07-19: qty 2
  Filled 2018-07-19: qty 8
  Filled 2018-07-19: qty 1
  Filled 2018-07-19: qty 8
  Filled 2018-07-19: qty 1
  Filled 2018-07-19: qty 8
  Filled 2018-07-19: qty 1
  Filled 2018-07-19 (×2): qty 8
  Filled 2018-07-19: qty 1
  Filled 2018-07-19: qty 8
  Filled 2018-07-19 (×2): qty 1
  Filled 2018-07-19 (×2): qty 8
  Filled 2018-07-19: qty 4
  Filled 2018-07-19: qty 7
  Filled 2018-07-19: qty 8
  Filled 2018-07-19: qty 1
  Filled 2018-07-19 (×3): qty 8
  Filled 2018-07-19: qty 6
  Filled 2018-07-19 (×2): qty 8
  Filled 2018-07-19: qty 7
  Filled 2018-07-19 (×4): qty 8
  Filled 2018-07-19: qty 1
  Filled 2018-07-19: qty 4
  Filled 2018-07-19 (×2): qty 8
  Filled 2018-07-19: qty 5
  Filled 2018-07-19: qty 7
  Filled 2018-07-19: qty 4
  Filled 2018-07-19: qty 1
  Filled 2018-07-19: qty 8
  Filled 2018-07-19: qty 7
  Filled 2018-07-19 (×2): qty 8
  Filled 2018-07-19: qty 7
  Filled 2018-07-19 (×3): qty 8
  Filled 2018-07-19 (×2): qty 1
  Filled 2018-07-19 (×2): qty 8
  Filled 2018-07-19: qty 3
  Filled 2018-07-19 (×2): qty 8

## 2018-07-19 MED ADMIN — sodium chloride 0.9 % (flush) injection syringe: TRANSDERMAL | @ 08:00:00

## 2018-07-19 MED ADMIN — acetaminophen 325 mg/10.15 mL oral suspension: GASTROSTOMY

## 2018-07-19 MED ADMIN — sodium chloride 0.9 % (flush) injection syringe: GASTROSTOMY | @ 23:00:00

## 2018-07-19 MED ADMIN — sodium chloride 0.9 % (flush) injection syringe: @ 20:00:00

## 2018-07-19 MED ADMIN — bacitracin 500 unit/gram topical ointment: @ 06:00:00 | NDC 00168001135

## 2018-07-19 MED ADMIN — lactated Ringers intravenous solution: ORAL | @ 20:00:00

## 2018-07-19 NOTE — Progress Notes (Signed)
[facility]  NEUROSURGERY   PROGRESS NOTE    [name], [name], 57 y.o. female  Date of Admission:  07/05/2018  Date of Service: 07/20/2018  Date of Birth:  03-Nov-1960     Chief Complaint: right upper and lower extremity weakness    Subjective:   Clobazam increased due to continued subclinical discharges, which responded to PRN Ativan. Bactrim started for right axilla wound.    Vital Signs:  Temp (24hrs) Max:38.8 C (762.8 F)      Systolic (31DVV), OHY:073 , Min:104 , XTG:626     Diastolic (94WNI), OEV:03, Min:68, Max:102    Temp  Avg: 37.9 C (100.3 F)  Min: 37 C (98.6 F)  Max: 38.8 C (101.8 F)  MAP (Non-Invasive)  Avg: 98.5 mmHG  Min: 82 mmHG  Max: 116 mmHG  Pulse  Avg: 104.3  Min: 96  Max: 119  Resp  Avg: 27.9  Min: 20  Max: 35  SpO2  Avg: 95.5 %  Min: 91 %  Max: 99 %  Pain Score (Numeric, Faces): 3    Min/Max/Avg ICP/CPP last 24hrs:   No data recorded    Today's Physical Exam:    Appears acutely ill, intubated  GCS _0 T  -- Eyes do not open to noxious stimuli  -- Localizes in LUE, Withdraws in RUE and BLE  PERRL  Face symmetric  +cough    Unable to assess hearing  Unable to assess speech  Unable to assess fund of knowledge  Unable to assess attention span & concentration  Unable to assess recent and remote memory  CN _1 EOMI Unable to assess  CN 11 shrug symmetric Unable to assess  Muscle Strength Unable to assess  Unable to assess drift    -- L fronto-temporal incision c/d/i, staples removed  -- JP drain Rapide  -- LD Rapide    Assessment/Plan:  [name] is a 57 yo F PMH HTN, HLD w/ 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 POD15/15.  -- FU video EEG   -- Keppra 1500 mg 2x/day   -- Vimpat 200 mg 2x/day   -- Dilantin 100 mg q8h   -- Clobazam 20 mg q8h   -- Will need outpatient  neurology follow-up  -- Please bathe patient daily and clean wound daily  -- Okay for soap/shampoo  -- Apply bacitracin ointment BID, cleaning old ointment off before application of new ointment  -- Final pathology: WHO grade I meningioma    -- Imaging:   -- Venous duplex LLE (07/23/18): ORDERED   -- MRI-brain w/wo (07/20/18): DONE   -- CT-brain wo (07/19/18): interval decrease of hematoma   -- Venous duplex BLE (07/16/18): left peroneal (calf vein) DVT   -- Venous duplex BUE (07/16/18): no evidence of DVT   -- TTE (07/12/18): EF 75%, no regional wall abnormalities   -- CT brain w/o (07/11/18): stable    -- CT brain w/o (07/09/18 @ 1328):stable   -- CT brain w/o (07/09/18 @ 0430):stable   -- CT brain w/o (07/08/18):postop changes from decompressive craniectomy with interval mild extracalvarial brain herniation through the craniectomy site; mild increase in cerebral edema at the tumor resection bed and adjacent parenchyma without significant increase in hemorrhage   -- MRI brain w/wo (07/06/18):gross total resection of a left medial tentorial meningioma; hemorrhagic productswithin the resection cavity  extending into the parenchyma of the left thalamus and tracking down within the left midbrain and extending to the fourth ventricle   -- CT brain w/o (07/06/18): postop changes from left decompressive craniectomy for evacuation of hematoma of tumor bed with residual hemorrhage and improved edema and rightward midline shift  -- CT brain w/o (07/06/18): postop changes from decompressive craniectomy for evacuation of tumor bed hematoma with minimal residual blood at the surgical site   -- CT brain w/o (07/05/18): postop changes with development of hematoma at tumor bed and 8 mm of midline shift  -- Pain/spasm control: Tylenol PRN, Fentanyl PRN  -- Diet: NPO + Osmolite TF  -- Bowel regimen, last BM 07/19/18  -- Abx: Bactrim x 10-days for R axilla cellulitis/blister, end-date  07/29/18   -- Blood Cx (07/18/18): NGTD   -- BAL (07/17/18): 15000 Anginosus group Viridans Streptococcus   -- CDiff (07/15/18): negative   -- Urine Cx (07/16/18): C. albicans  -- Activity: up in chair with assist   -- Craniectomy precautions, helmet to be worn at all times when OOB  -- PT/OT: SNF vs LTAC  -- DVT ppx: Lovenox, SCDs/Venodynes  -- Consults:    NCCU/Neurology   -- Will need outpatient neurology follow-up for AED management   Trauma   -- Trach/PEG completed 07/15/18  -- Lines/Drains: Trach/PEG  -- Wound: L fronto-temporal incision (staples removed), JP drain Rapide, LD Rapide, helmet  -- Disposition: ongoing    [name], M.D.  PGY-4 Neurosurgery  07/20/2018, 06:34       I saw and examined the patient.  I reviewed the resident's note.  I agree with the findings and plan of care as documented in the resident's note.  Any exceptions/additions are edited/noted.    Vincente Poli, MD

## 2018-07-19 NOTE — Respiratory Therapy (Signed)
Patient remains in 28% ATC.  Ventilator kept at bedside on standby if patient would need it.  No issues throughout the night on ATC.  Breath sounds are clear/diminished.  No respiratory distress noted.  RT will continue to monitor.

## 2018-07-19 NOTE — Progress Notes (Signed)
[facility]  NEUROSURGERY   PROGRESS NOTE    [name], [name], 57 y.o. female  Date of Admission:  07/05/2018  Date of Service: 07/19/2018  Date of Birth:  08-04-1960     Chief Complaint: right upper and lower extremity weakness    Subjective:   NAEO    Vital Signs:  Temp (24hrs) Max:38.7 C (262.0 F)      Systolic (35DHR), CBU:384 , Min:105 , TXM:468     Diastolic (03OZY), YQM:25, Min:68, Max:97    Temp  Avg: 38.1 C (100.5 F)  Min: 37.2 C (99 F)  Max: 38.7 C (101.7 F)  MAP (Non-Invasive)  Avg: 92 mmHG  Min: 80 mmHG  Max: 111 mmHG  Pulse  Avg: 96.7  Min: 90  Max: 111  Resp  Avg: 26.4  Min: 19  Max: 32  SpO2  Avg: 97.2 %  Min: 94 %  Max: 99 %  Pain Score (Numeric, Faces): 3    Min/Max/Avg ICP/CPP last 24hrs:   No data recorded    Today's Physical Exam:    Appears acutely ill, intubated  GCS 1 5 1  T  -- Eyes do not open to noxious stimuli  -- Localizes in LUE, Withdraws in RUE and BLE  PERRL  Face symmetric  +cough    Unable to assess hearing  Unable to assess speech  Unable to assess fund of knowledge  Unable to assess attention span & concentration  Unable to assess recent and remote memory  CN 3 4 6  EOMI Unable to assess  CN 11 shrug symmetric Unable to assess  Muscle Strength Unable to assess  Unable to assess drift    -- L cranial staples c/d/i  -- JP drain Rapide  -- LD Rapide    Assessment/Plan:  [name] is a 58 yo F PMH HTN, HLD w/ 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 POD14/14  -- FU video EEG, two events overnight on 07/16/18   -- Keppra 1500 mg 2x/day   -- Vimpat 200 mg 2x/day   -- Dilantin 100 mg q8h   -- Clobazam 5 mg 2x/day   -- Will need outpatient neurology follow-up  -- Please bathe patient daily and clean wound daily  -- Okay for soap/shampoo  --  Apply bacitracin ointment BID, cleaning old ointment off before application of new ointment  -- Final pathology: WHO grade I meningioma    -- Imaging:   -- Venous duplex LLE (07/23/18): ORDERED   -- Venous duplex BLE (07/16/18): left peroneal (calf vein) DVT   -- Venous duplex BUE (07/16/18): no evidence of DVT   -- TTE (07/12/18): EF 75%, no regional wall abnormalities   -- CT brain w/o (07/11/18): stable    -- CT brain w/o (07/09/18 @ 1328):stable   -- CT brain w/o (07/09/18 @ 0430):stable   -- CT brain w/o (07/08/18):postop changes from decompressive craniectomy with interval mild extracalvarial brain herniation through the craniectomy site; mild increase in cerebral edema at the tumor resection bed and adjacent parenchyma without significant increase in hemorrhage   -- MRI brain w/wo (07/06/18):gross total resection of a left medial tentorial meningioma; hemorrhagic productswithin the resection cavity extending into the parenchyma of the left thalamus and tracking down within the left midbrain and extending to the fourth ventricle   -- CT brain w/o (07/06/18): postop changes from  left decompressive craniectomy for evacuation of hematoma of tumor bed with residual hemorrhage and improved edema and rightward midline shift  -- CT brain w/o (07/06/18): postop changes from decompressive craniectomy for evacuation of tumor bed hematoma with minimal residual blood at the surgical site   -- CT brain w/o (07/05/18): postop changes with development of hematoma at tumor bed and 8 mm of midline shift  -- Pain/spasm control: Tylenol PRN, Fentanyl PRN  -- Diet: NPO + Osmolite TF + FWF 200 cc q6h  -- Bowel regimen, last BM 07/18/18  -- Abx: Ceftriaxone empiric UTI, end 12/29   -- Blood Cx (07/12/18): negative   -- CDiff (07/15/18): negative   -- Urine Cx (07/16/18): C. albicans  -- Activity: up in chair with assist   -- Craniectomy precautions, helmet to be worn at all times when OOB  -- PT/OT: SNF vs  LTAC  -- DVT ppx: Lovenox, SCDs/Venodynes  -- Consults:    NCCU/Neurology   -- Will need outpatient neurology follow-up for AED management   Trauma   -- Trach/PEG completed 07/15/18  -- Lines/Drains: Midline, Trach/PEG, Foley catheter  -- Wound: L fronto-temporal staples, JP drain Rapide, LD Rapide, helmet  -- Disposition: ongoing    Keane Police, M.D.  PGY-2 Neurosurgery  07/19/2018, 04:06     Late entry for 07/19/18. I saw and examined the patient.  I reviewed the resident's note.  I agree with the findings and plan of care as documented in the resident's note.  Any exceptions/additions are edited/noted.    Lennie Muckle, MD

## 2018-07-19 NOTE — Care Management Notes (Signed)
[facility]  Care Management Note    Patient Name: [name]  Date of Birth: [birth year]  Sex: female  Date/Time of Admission: 07/05/2018  7:26 AM  Room/Bed: 11/A  Payor: HEALTH PLAN / Plan: HEALTH PLAN (PPO-NON ST EMP) / Product Type: PPO /    LOS: 14 days   Primary Care Providers:  Isac Caddy, MD, MD (General)    Admitting Diagnosis:  Meningioma (CMS [facility]) [D32.9]    Assessment:      07/19/18 1043   Assessment Details   Assessment Type Continued Assessment   Date of Care Management Update 07/19/18   Date of Next DCP Update 07/22/18   Care Management Plan   Discharge Planning Status plan in progress   Projected Discharge Date 07/22/18   Discharge Needs Assessment   Discharge Facility/Level of Care Needs Undetermined at this time   Patient not medically ready for discharge at this time.  Patient continues with elevated temp and seizure activity overnight.  Patient is tolerating ATC at present time.  Plan is to continue to monitor patient, labs, medication management, PT/OT.    Discharge Plan:  Undetermined at this time  Discharge plans undetermined pending patient progress and PT/OT recommendations closer to discharge.    The patient will continue to be evaluated for developing discharge needs.     Case Manager: Claudius Sis, [place] COORDINATOR  Phone: [phone]

## 2018-07-19 NOTE — Procedures (Signed)
NAMELOUIE, [name]   HOSPITAL NUMBER:  [id number]  DATE OF SERVICE:  07/17/2018  DOB:  1960-11-09  SEX:  F      Date of Study: July 17, 2018, start time 16:31:00, end time July 18, 2018, at 11:35:53.   EEG #: [id number].   Technician: AP.    REQUESTING PROVIDER:  Theodora Blow, APRN, NP-C.    HISTORY:  The patient is a 57 year old woman with history of gradually progressive extremity weakness, found to have left hemispheric meningioma status post craniotomy for resection.  EEG is requested for followup on previous abnormal EEG.    MEDICATIONS:  1. Vimpat 200 twice a day.  2. Keppra 1500 twice a day.  3. Dilantin 100 twice a day.  4. Onfi 10 mg every 8 hours.    REPORT:  This is a digitally acquired video EEG performed with the standard 10-20 system of electrode placement.   The background was characterized by generalized polymorphic delta admixed with theta frequency.   No posterior dominant rhythm or anteroposterior gradient was seen.   Background was continuous and symmetric.   State change was seen.  No normal sleep architecture was seen.   Hyperventilation and photic stimulation were not performed.   No clinical events were marked.    ABNORMAL ACTIVITY:  Occasional brief runs of generalized periodic discharges typically at 1 Hz were seen.    INTERPRETATION:  Abnormal inpatient EEG due to:  1. Occasional brief runs of generalized periodic discharges at 1 Hz suggesting diffuse cortical hyperexcitability.  2. Moderate generalized slowing suggesting underlying diffuse or multifocal dysfunction.      Olean Ree MD  Assistant Professor, Neurology  [facility]            CC:   Theodora Blow, APRN, NP-C   [address]   [place], [place] [address]       DD:  07/18/2018 15:05:47  DT:  07/18/2018 15:31:52 [place]  D#:  [id number]

## 2018-07-19 NOTE — Procedures (Signed)
Neurosurgery  07/19/2018  14:24    [name]  [id number]  10/04/1960, 57 y.o., female    L frontal incision staples were removed today at bedside. No immediate complications. No signs of superficial skin infection. Patient tolerated procedure without complications.     Rushie Goltz, MD  PGY-1 Neurosurgery  Pager [phone]  07/19/2018,14:25

## 2018-07-19 NOTE — Procedures (Signed)
[name], [name]   HOSPITAL NUMBER:  [id number]  DATE OF SERVICE:  07/18/2018  DOB:  1961/07/06  SEX:  F      Date of study:  July 18, 2018, start time 14:55:24, end time July 19, 2018, at 15:00    EEG#: [id number].  Technician:  AP.    REQUESTING PHYSICIAN:  Theodora Blow, APRN, NP-C.    HISTORY:  The patient is a 57 year old woman with a history of gradually progressive extremity weakness found to have left hemispheric meningioma, status post craniotomy for resection.  EEG is requested for followup of previous abnormal EEG.    MEDICATIONS:  1. Vimpat 200 twice a day.  2. Keppra 1500 twice a day.  3. Dilantin 100 twice a day.  4. Onfi 10 mg every 8 hours.    REPORT:  This is a digitally acquired video EEG performed with the standard 10-20 system of electrode placement.     The background was characterized by generalized polymorphic theta admixed with delta activity.     No posterior dominant rhythm was seen.     Background was continuous and symmetric.     State change was seen.  No normal sleep architecture was seen.     Hyperventilation and photic stimulation were not performed.     No clinical events were marked.    ABNORMAL ACTIVITY:  Abundant generalized periodic discharges at 1.5-2 Hz were on ictal interictal continuum.  These improved over the course of the record and were seen at a at 1-1.5 Hz towards the end of this record.   Frequent intermittent polymorphic delta activity over the left temporal region was seen.    INTERPRETATION:  Abnormal inpatient EEG due to:  1. 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  These improved through the course of the record and towards the end of the record were seen to be at 1-1.5 Hz in frequency.  2. Moderate generalized slowing suggesting underlying diffuse or multifocal dysfunction worse over the left temporal region.      Olean Ree MD  Assistant  Professor, Neurology  [facility]            CC:   Theodora Blow, APRN, NP-C   [address]   [place], [place] [address]       DD:  07/19/2018 14:28:19  DT:  07/19/2018 15:24:30 TC  D#:  [id number]

## 2018-07-19 NOTE — Progress Notes (Addendum)
[facility]  Neurocritical Care (NCCU) Progress Note    [name], [name]  Date of Admission:  07/05/2018  Date of Service: 07/19/2018  Date of Birth:  [birth year]    Primary Attending: [name], MD   Primary Service: NEUROSURGERY [address] [place]     Chief Complaint: Right sided weakness s/p left tentorial meningioma resection      Subjective: Febrile overnight with Tmax 38.7.  Tolerating ATC overnight.      Vital Signs:  Temp (24hrs) Max:38.7 C (509.3 F)      Systolic (26ZTI), WPY:099 , Min:105 , IPJ:825     Diastolic (05LZJ), QBH:41, Min:68, Max:97    Temp  Avg: 38 C (100.4 F)  Min: 37.2 C (99 F)  Max: 38.7 C (101.7 F)  MAP (Non-Invasive)  Avg: 95.2 mmHG  Min: 80 mmHG  Max: 113 mmHG  Pulse  Avg: 98  Min: 91  Max: 111  Resp  Avg: 27.6  Min: 23  Max: 32  SpO2  Avg: 96.6 %  Min: 93 %  Max: 99 %  Pain Score (Numeric, Faces): 3    Current Medications:  acetaminophen (TYLENOL) 160 mg per 5 mL liquid - grape flavored, 650 mg, Gastric (NG, OG, PEG, GT), Q4H PRN  ascorbic acid (VITAMIN C) tablet, 500 mg, Gastric (NG, OG, PEG, GT), 2x/day  bacitracin 500 units/gram topical ointment tube, , Topical, 2x/day  carvedilol (COREG) tablet, 12.5 mg, Gastric (NG, OG, PEG, GT), 2x/day-Food  chlorhexidine gluconate (PERIDEX) 0.12% mouthwash, 15 mL, Swish & Spit, 2x/day  cloBAZam (ONFI) 2.5 mg per mL oral liquid, 10 mg, Gastric (NG, OG, PEG, GT), Q8HRS  enoxaparin PF (LOVENOX) 40 mg/0.4 mL SubQ injection, 40 mg, Subcutaneous, Q24H  fentaNYL (SUBLIMAZE) 50 mcg/mL injection, 25 mcg, Intravenous, Q1H PRN  hydrALAZINE (APRESOLINE) injection 10 mg, 10 mg, Intravenous, Q4H PRN  labetalol (TRANDATE) 5 mg/mL injection, 10 mg, Intravenous, Q1H PRN  lacosamide (VIMPAT) tablet, 200 mg, Gastric (NG, OG, PEG, GT), 2x/day  lanolin-oxyquin-pet, hydrophil (BAG BALM) topical ointment, , Apply Topically, 2x/day PRN  levETIRAcetam (KEPPRA) tablet, 1,500 mg, Gastric (NG, OG, PEG, GT), 2x/day  lisinopril (PRINIVIL) tablet, 40 mg,  Gastric (NG, OG, PEG, GT), Daily  LORazepam (ATIVAN) 2 mg/mL injection ---Cabinet Override, , ,   nicotine (NICODERM CQ) transdermal patch (mg/24 hr), 14 mg, Transdermal, Daily  NS flush syringe, 2 mL, Intracatheter, Q8HRS    And  NS flush syringe, 2-6 mL, Intracatheter, Q1 MIN PRN  nutrition protein supplement 15 g per 30 mL packet, 1 Packet, Gastric (NG, OG, PEG, GT), 2x/day  nystatin (MYCOSTATIN) 100,000 units per mL oral liquid, 5 mL, Swish & Spit, 4x/day  nystatin (NYSTOP) 100,000 units/g topical powder, , Apply Topically, 2x/day  ondansetron (ZOFRAN) 2 mg/mL injection, 4 mg, Intravenous, Q6H PRN  perflutren lipid microspheres (DEFINITY) 1.3 mL in NS 10 mL (tot vol) injection, 2 mL, Intravenous, Give in Cardiology  phenytoin (DILANTIN) 50 mg/mL injection, 100 mg, Intravenous, Q8H  potassium bicarbonate-citric acid (EFFER-K) effervescent tablet, 40 mEq, Gastric (NG, OG, PEG, GT), Daily with Breakfast  SSIP insulin lispro (HUMALOG) 100 units/mL injection, 0-12 Units, Subcutaneous, Q4H PRN      Today's Physical Exam:   General: Acutely ill  Neurologic: E1 M5 V1T: does not open eyes, intubated.  PERRL, no facial weakness, + cough.  Motor: LUE localizes, tries to localize RUE but does not cross midline; BLE withdraw. UTA sensation  Cardiovascular: Heart regular rate and rhythm  Lungs: CTAB  Abdomen: soft, non-tender and bowel sounds  normal  Extremities: no cyanosis or edema  Skin: Skin warm and dry    Labs:  I have reviewed all lab results.    I/O:  I/O last 24 hours:      Intake/Output Summary (Last 24 hours) at 07/19/2018 0635  Last data filed at 07/18/2018 1800  Gross per 24 hour   Intake 1417 ml   Output --   Net 1417 ml     I/O current shift:  No intake/output data recorded.    Drips:   Current Facility-Administered Medications   Medication Dose Frequency Last Rate         Radiology Results:    CT brain 07/11/18: "1.  Stable postoperative changes from left decompressive craniectomy with persistent  extracalvarial brain herniation.  2.  Persistent cerebral edema and hemorrhagic tumor resection site. No new  Hemorrhage."    MRI brain w/wo contrast 07/06/18: "acute postoperative changes related to gross total resection of a left medial tentorial meningioma with previously seen mass effect on the left thalamus, left middle cerebral peduncle and left midbrain. The current examination demonstrate hemorrhagic products are identified within the resection cavity extending into the parenchyma of the left thalamus and tracking down within the left midbrain and extending to the fourth Ventricle."        Impression and Plan:    Active Hospital Problems    Diagnosis   . 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 Tobacco use disorder   . Hypokalemia     57 year old female with progressive R sided weakness s/p left meningioma resection s/p Craniectomy for evacuation of tumor bed hematoma.       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  -vEEG  -Current AEDs   -Keppra 1500 BID   -Vimpat 200 BID   -Phenytoin 100 TID- level 1.9   -Onfi 61m TID --> increased to 269mTID for worsening GPEDs    Pain/sedation  -- Tylenol PRN, x2 overnight    Cardiovascular:    HTN, chronic  -- SBP goal < 160  -- Hydralazine/labetalol PRN- none overnight  -- Current antihypertensives:  -- Coreg 12.5 BID   -- Lisinopril 4096maily    Respiratory:    Acute respiratory failure requiring mechanical ventilation s/p tracheostomy   -- Has been on ATC for >24 hrs  -- CXR 12/23 Well-circumscribed nodular opacity overlying the  right lung base  -- Recommenced CT Chest as outpatient    Renal: No acute issues  -Daily BMP    Infectious Disease: leucocytosis, afebrile  - blood cultures 12/26: ngtd  - BAL 12/25: ngtd  -Urine Cx 12/23 - Candida albicans, replaced foley cath  -Urine Cx 12/24: Candida Albicans, foley removed     Hematology:Normocytic anemia   -1u pRBCs 12/23 for hbg 6.8  -continue to monitor and transfuse if hb<7    DVT prophylaxis  - SCD's / lovenox     LLE DVT  Peripheral Venous Duplex 07/16/18: LLE DVT (calf vein)  --repeat in 1 week / 07/23/18 (ordered)    Gastrointestinal: Diarrhea   - C.  Diff 12/23 - neg  - last BM 12/26    Nutrition  - Osmolite 1.5 5 cans/day with FWF 200 Q6  - Vit C  - Protein packet    Endocrine:  No acute issues  -- SSIP conservative  -- Glucose 91-126    Integumentary: Excoriation in inner thigh area  -Bag balm   -Nystatin powder BID    Psychiatric:  No acute issues  -- Monitor for ICU delirium     Prophylaxis:    DVT/PE Prophylaxis: SCDs/ Venodynes/Impulse boots  GI: NA  Mouth: Peridex    Family:  None present at bedside  Disposition/ Baseline:  ICU  Advance Directives:  None-Discussed  Hospice involvement prior to admission?  no    Code Status Information     Code Status    Full Code            Roel Cluck, PA-C 07/19/2018   NeuroCritical Care Unit    I personally saw and evaluated the patient. See mid-level's note for additional details. My findings/participation are   Patient not following  Opens eyes to stimuli  GPEDS on EEG improved with Ativan and increase in [place] at bedside and updated.    Valora Corporal, MD    CODING CLARIFICATION: UTI ruled out with urine culture        Roel Cluck, PA-C 08/03/2018   NeuroCritical Care Unit

## 2018-07-20 ENCOUNTER — Inpatient Hospital Stay (HOSPITAL_COMMUNITY): Payer: PPO

## 2018-07-20 ENCOUNTER — Inpatient Hospital Stay (HOSPITAL_COMMUNITY)

## 2018-07-20 DIAGNOSIS — I676 Nonpyogenic thrombosis of intracranial venous system: Secondary | ICD-10-CM

## 2018-07-20 DIAGNOSIS — R609 Edema, unspecified: Secondary | ICD-10-CM

## 2018-07-20 DIAGNOSIS — D320 Benign neoplasm of cerebral meninges: Secondary | ICD-10-CM

## 2018-07-20 LAB — POC BLOOD GLUCOSE (RESULTS)
GLUCOSE, POC: 155 mg/dL — ABNORMAL HIGH (ref 70–105)
GLUCOSE, POC: 119 mg/dL — ABNORMAL HIGH (ref 70–105)
GLUCOSE, POC: 103 mg/dL (ref 70–105)

## 2018-07-20 LAB — CBC
HCT: 26.4 % — ABNORMAL LOW (ref 34.8–46.0)
HGB: 8.4 g/dL — ABNORMAL LOW (ref 11.5–16.0)
MCH: 29 pg (ref 26.0–32.0)
MCHC: 31.8 g/dL (ref 31.0–35.5)
MCV: 91 fL (ref 78.0–100.0)
MPV: 9.2 fL (ref 8.7–12.5)
PLATELETS: 519 x10ˆ3/uL — ABNORMAL HIGH (ref 150–400)
RBC: 2.9 x10ˆ6/uL — ABNORMAL LOW (ref 3.85–5.22)
RDW-CV: 15.1 % (ref 11.5–15.5)
WBC: 10.1 x10ˆ3/uL (ref 3.7–11.0)

## 2018-07-20 LAB — BASIC METABOLIC PANEL
ANION GAP: 9 mmol/L (ref 4–13)
BUN/CREA RATIO: 22 (ref 6–22)
BUN: 14 mg/dL (ref 8–25)
CALCIUM: 8.8 mg/dL (ref 8.5–10.2)
CHLORIDE: 102 mmol/L (ref 96–111)
CO2 TOTAL: 26 mmol/L (ref 22–32)
CREATININE: 0.65 mg/dL (ref 0.49–1.10)
ESTIMATED GFR: >60 mL/min/1.73mˆ2 (ref >60)
GLUCOSE: 174 mg/dL — ABNORMAL HIGH (ref 65–139)
POTASSIUM: 3.7 mmol/L (ref 3.5–5.1)
SODIUM: 137 mmol/L (ref 136–145)

## 2018-07-20 LAB — PHOSPHORUS: PHOSPHORUS: 3.4 mg/dL (ref 2.4–4.7)

## 2018-07-20 LAB — MAGNESIUM: MAGNESIUM: 2 mg/dL (ref 1.6–2.6)

## 2018-07-20 MED ORDER — IOVERSOL 350 MG IODINE/ML INTRAVENOUS SOLUTION
50.00 mL | INTRAVENOUS | Status: AC
Start: 2018-07-20 — End: 2018-07-20
  Administered 2018-07-20 (×2): 50 mL via INTRAVENOUS

## 2018-07-20 MED ORDER — LORAZEPAM 2 MG/ML INJECTION SOLUTION
INTRAMUSCULAR | Status: AC
Start: 2018-07-20 — End: 2018-07-20
  Administered 2018-07-20: 2 mg via INTRAVENOUS
  Filled 2018-07-20: qty 1

## 2018-07-20 MED ORDER — IOPAMIDOL 370 MG IODINE/ML (76 %) INTRAVENOUS SOLUTION
INTRAVENOUS | Status: AC
Start: 2018-07-20 — End: 2018-07-20
  Filled 2018-07-20: qty 50

## 2018-07-20 MED ORDER — PHENYTOIN 100 MG/4 ML ORAL SUSPENSION
100.00 mg | Freq: Three times a day (TID) | ORAL | Status: DC
Start: 2018-07-20 — End: 2018-08-08
  Administered 2018-07-20 – 2018-08-07 (×56): 100 mg via GASTROSTOMY
  Filled 2018-07-20 (×74): qty 4

## 2018-07-20 MED ORDER — LORAZEPAM 2 MG/ML INJECTION SOLUTION
2.0000 mg | INTRAMUSCULAR | Status: AC
Start: 2018-07-20 — End: 2018-07-20

## 2018-07-20 MED ORDER — LORAZEPAM 2 MG/ML INJECTION SOLUTION
2.0000 mg | Freq: Once | INTRAMUSCULAR | Status: AC
Start: 2018-07-20 — End: 2018-07-20
  Administered 2018-07-20: 2 mg via INTRAVENOUS
  Filled 2018-07-20: qty 1

## 2018-07-20 MED ORDER — GADOBUTROL 10 MMOL/10 ML (1 MMOL/ML) INTRAVENOUS SOLUTION
9.00 mL | INTRAVENOUS | Status: AC
Start: 2018-07-20 — End: 2018-07-20
  Administered 2018-07-20: 11:00:00 9 mL via INTRAVENOUS

## 2018-07-20 MED ORDER — SENNA LEAF EXTRACT 176 MG/5 ML ORAL SYRUP
10.0000 mL | ORAL_SOLUTION | Freq: Two times a day (BID) | ORAL | Status: DC
Start: 2018-07-20 — End: 2018-07-21
  Administered 2018-07-20 – 2018-07-21 (×3): 352 mg via GASTROSTOMY
  Filled 2018-07-20 (×3): qty 15

## 2018-07-20 MED ORDER — SODIUM CHLORIDE 0.9 % IV BOLUS
500.0000 mL | INJECTION | Freq: Once | Status: AC
Start: 2018-07-20 — End: 2018-07-20
  Administered 2018-07-20: 0 mL via INTRAVENOUS
  Administered 2018-07-20: 500 mL via INTRAVENOUS

## 2018-07-20 MED ORDER — POLYETHYLENE GLYCOL 3350 17 GRAM ORAL POWDER PACKET
17.0000 g | Freq: Every day | ORAL | Status: DC
Start: 2018-07-20 — End: 2018-07-21
  Administered 2018-07-20 – 2018-07-21 (×2): 17 g via GASTROSTOMY
  Filled 2018-07-20 (×2): qty 1

## 2018-07-20 MED ADMIN — levETIRAcetam 500 mg tablet: GASTROSTOMY | @ 20:00:00

## 2018-07-20 MED ADMIN — nystatin 100,000 unit/gram topical powder: TOPICAL | @ 08:00:00

## 2018-07-20 MED ADMIN — sodium chloride 0.9 % (flush) injection syringe: @ 14:00:00

## 2018-07-20 MED ADMIN — carvediloL 12.5 mg tablet: GASTROSTOMY | @ 16:00:00

## 2018-07-20 MED ADMIN — nystatin 100,000 unit/gram topical powder: GASTROSTOMY | @ 08:00:00 | NDC 00574200815

## 2018-07-20 MED ADMIN — sodium chloride 0.9 % intravenous solution: GASTROSTOMY | @ 08:00:00 | NDC 00338004904

## 2018-07-20 NOTE — Progress Notes (Signed)
[facility]  NEUROSURGERY   PROGRESS NOTE    [name], [name], 57 y.o. female  Date of Admission:  07/05/2018  Date of Service: 07/21/2018  Date of Birth:  08-16-1960     Chief Complaint: right upper and lower extremity weakness    Subjective:   NAEO    Vital Signs:  Temp (24hrs) Max:39.2 C (196.2 F)      Systolic (22LNL), GXQ:119 , Min:79 , ERD:408     Diastolic (14GYJ), EHU:31, Min:54, Max:102    Temp  Avg: 38.1 C (100.6 F)  Min: 36.9 C (98.4 F)  Max: 39.2 C (102.6 F)  MAP (Non-Invasive)  Avg: 90.3 mmHG  Min: 63 mmHG  Max: 115 mmHG  Pulse  Avg: 104.6  Min: 93  Max: 120  Resp  Avg: 28.6  Min: 22  Max: 34  SpO2  Avg: 93.9 %  Min: 88 %  Max: 97 %  Pain Score (Numeric, Faces): 3    Min/Max/Avg ICP/CPP last 24hrs:   No data recorded    Today's Physical Exam:    Appears acutely ill, intubated  GCS _0 T  -- Eyes do not open to noxious stimuli  -- Localizes in LUE, Withdraws in RUE and BLE  PERRL  Face symmetric  +cough    Unable to assess hearing  Unable to assess speech  Unable to assess fund of knowledge  Unable to assess attention span & concentration  Unable to assess recent and remote memory  CN _1 EOMI Unable to assess  CN 11 shrug symmetric Unable to assess  Muscle Strength Unable to assess  Unable to assess drift    -- L fronto-temporal incision c/d/i, staples removed  -- JP drain Rapide  -- LD Rapide    Assessment/Plan:  [name] is a 57 yo F PMH HTN, HLD w/ 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 POD16/16  -- FU video EEG   -- Keppra 1500 mg 2x/day   -- Vimpat 200 mg 2x/day   -- Dilantin 100 mg q8h   -- Clobazam 20 mg q8h   -- Will need outpatient neurology follow-up  -- Please bathe patient daily and clean wound daily  -- Okay for soap/shampoo  -- Apply  bacitracin ointment BID, cleaning old ointment off before application of new ointment  -- Final pathology: WHO grade I meningioma    -- Imaging:   -- Venous duplex LLE (07/23/18): ORDERED   -- CTV intra w/wo (07/20/18): DONE   -- US soft tissue axilla (07/20/18): DONE   -- MRI-brain w/wo (07/20/18): possible non-occlusive thrombus in transverse sinus   -- CT-brain wo (07/19/18): interval decrease of hematoma   -- Venous duplex BLE (07/16/18): left peroneal (calf vein) DVT   -- Venous duplex BUE (07/16/18): no evidence of DVT   -- TTE (07/12/18): EF 75%, no regional wall abnormalities   -- CT brain w/o (07/11/18): stable    -- CT brain w/o (07/09/18 @ 1328):stable   -- CT brain w/o (07/09/18 @ 0430):stable   -- CT brain w/o (07/08/18):postop changes from decompressive craniectomy with interval mild extracalvarial brain herniation through the craniectomy site; mild increase in cerebral edema at the tumor resection bed and adjacent parenchyma without significant increase in hemorrhage   -- MRI brain w/wo (07/06/18):gross total resection of a left medial tentorial meningioma;  hemorrhagic productswithin the resection cavity extending into the parenchyma of the left thalamus and tracking down within the left midbrain and extending to the fourth ventricle   -- CT brain w/o (07/06/18): postop changes from left decompressive craniectomy for evacuation of hematoma of tumor bed with residual hemorrhage and improved edema and rightward midline shift  -- CT brain w/o (07/06/18): postop changes from decompressive craniectomy for evacuation of tumor bed hematoma with minimal residual blood at the surgical site   -- CT brain w/o (07/05/18): postop changes with development of hematoma at tumor bed and 8 mm of midline shift  -- Pain/spasm control: Tylenol PRN, Fentanyl PRN  -- Diet: NPO + Osmolite TF  -- Bowel regimen, last BM 07/19/18  -- Abx: Bactrim x 10-days for R axilla cellulitis/blister, end-date 07/29/18   --  Blood Cx (07/18/18): NGTD   -- BAL (07/17/18): 15000 Anginosus group Viridans Streptococcus   -- CDiff (07/15/18): negative   -- Urine Cx (07/16/18): C. albicans  -- Activity: up in chair with assist   -- Craniectomy precautions, helmet to be worn at all times when OOB  -- PT/OT: SNF vs LTAC  -- DVT ppx: Lovenox, SCDs/Venodynes  -- Consults:    NCCU/Neurology   -- Will need outpatient neurology follow-up for AED management   Trauma   -- Trach/PEG completed 07/15/18  -- Lines/Drains: Trach/PEG  -- Wound: L fronto-temporal incision (staples removed), JP drain Rapide, LD Rapide, helmet  -- Disposition: ongoing    Leta Baptist, MD  PGY-3 Neurosurgery  07/21/2018       I saw and examined the patient.  I reviewed the resident's note.  I agree with the findings and plan of care as documented in the resident's note.  Any exceptions/additions are edited/noted.    Vincente Poli, MD

## 2018-07-20 NOTE — Ancillary Notes (Signed)
[facility]  MRI Technologist Note        MRI has been completed.        Haig Prophet, RTR 07/20/2018, 11:27

## 2018-07-20 NOTE — Care Plan (Addendum)
Pt s/p L tent meningioma resection and subsequent L crani due to herniation & swelling. GCS 1/5/1. Pt remained febrile throughout most of the night (Tmax = 38.4). Ice bags were placed on neck and beneath both arms. No morning labs were drawn (NCCU Monica Martinez ok'd this) due to neither IV drawing. Monica Martinez said a PICC line was ordered; however, it was not placed overnight. EMU called twice throughout the night and said pt was showing seizure activity. Gorby ordered 2mg  lorazepam each time. Aggressive pulmonary toileting was performed (P&V bed, oral care w/ chlorhexidine, etc.) Pt has developed a large palpable mass (approx 5 cm) beneath what appears to be a blister that burst. Monica Martinez came out and examined said mass and believes it to be an infection. Subsequently, pt was started on Bactrim. As a side note, pt's daughters report that pt had breast implants placed about 15 years ago.    NB: EMU was contacted before pt was lifted to chair. They said that pt could not wear helmet over EEG leads, as this would pull the leads off. However, since pt had crani, she cannot be out of bed without her helmet. Therefore, pt was left in bed and turned q2h.      Patient Goals Based on Modifiable Risk Factors    The following modifiable risk factors were discussed with patient:     Hypertension    Goal:   Maintain blood pressure less than 140/90 (or less than 130/90 for patients with diabetes or chronic kidney disease).    Patient's selected lifestyle modification(s):  Routinely take antihypertensive medications as prescribed  Maintain consistent aerobic physical activity  Support weight loss with a diet of increased fruits and vegetables and low-fat dairy products    Patient/Family/Caregiver response to plan:    Patient unable to respond (trach).        Problem: Adult Inpatient Plan of Care  Goal: Patient-Specific Goal (Individualization)  Outcome: Ongoing (see interventions/notes)  Flowsheets (Taken 07/20/2018 0507)  Individualized Care  Needs: Pt required VERY frequent suctioning; a lot of thick, white, creamy secretions  Anxieties, Fears or Concerns: Pt's family was concerned that she was being "left in bed" for too long.  Patient-Specific Goals (Include Timeframe): Pt will be up in chair on 07/20/18     Problem: Skin and Tissue Injury (Mechanical Ventilation, Invasive)  Goal: Absence of Device-Related Skin and Tissue Injury  Outcome: Ongoing (see interventions/notes)     Problem: Device-Related Complication Risk (Artificial Airway)  Goal: Optimal Device Function  Outcome: Ongoing (see interventions/notes)     Problem: Skin and Tissue Injury (Artificial Airway)  Goal: Absence of Device-Related Skin or Tissue Injury  Outcome: Ongoing (see interventions/notes)

## 2018-07-20 NOTE — Respiratory Therapy (Signed)
RN called because she was worried [name] was again biting down on her tongue.  Assessed patient and placed pink oral bite block between upper and lower jaw.  Patient is not in any apparent respiratory distress.  RT will continue to follow and monitor.

## 2018-07-20 NOTE — Progress Notes (Deleted)
[facility]  Neurocritical Care (NCCU) Progress Note    Cardarelli, Katerin  Date of Admission:  07/05/2018  Date of Service: 07/20/2018  Date of Birth:  05-Nov-1960    Primary Attending: [name], MD   Primary Service: NEUROSURGERY 1 [place]     Chief Complaint: Right sided weakness s/p left tentorial meningioma resection      Subjective: Started on bactrim for right axilla abscess. Ativan given x2 overnight for increased GPEDs on EEG. Remains on ATC, tolerating well.     Vital Signs:  Temp (24hrs) Max:38.8 C (176.1 F)      Systolic (60VPX), TGG:269 , Min:104 , SWN:462     Diastolic (70JJK), KXF:81, Min:68, Max:102    Temp  Avg: 37.9 C (100.3 F)  Min: 37 C (98.6 F)  Max: 38.8 C (101.8 F)  MAP (Non-Invasive)  Avg: 99.3 mmHG  Min: 82 mmHG  Max: 116 mmHG  Pulse  Avg: 105.3  Min: 96  Max: 119  Resp  Avg: 27.9  Min: 22  Max: 35  SpO2  Avg: 95.3 %  Min: 91 %  Max: 99 %  Pain Score (Numeric, Faces): 3    Current Medications:  acetaminophen (TYLENOL) 160 mg per 5 mL liquid - grape flavored, 650 mg, Gastric (NG, OG, PEG, GT), Q4H PRN  ascorbic acid (VITAMIN C) tablet, 500 mg, Gastric (NG, OG, PEG, GT), 2x/day  bacitracin 500 units/gram topical ointment tube, , Topical, 2x/day  carvedilol (COREG) tablet, 12.5 mg, Gastric (NG, OG, PEG, GT), 2x/day-Food  chlorhexidine gluconate (PERIDEX) 0.12% mouthwash, 15 mL, Swish & Spit, 2x/day  cloBAZam (ONFI) 2.5 mg per mL oral liquid, 20 mg, Gastric (NG, OG, PEG, GT), Q8HRS  enoxaparin PF (LOVENOX) 40 mg/0.4 mL SubQ injection, 40 mg, Subcutaneous, Q24H  fentaNYL (SUBLIMAZE) 50 mcg/mL injection, 25 mcg, Intravenous, Q1H PRN  hydrALAZINE (APRESOLINE) injection 10 mg, 10 mg, Intravenous, Q4H PRN  labetalol (TRANDATE) 5 mg/mL injection, 10 mg, Intravenous, Q1H PRN  lacosamide (VIMPAT) tablet, 200 mg, Gastric (NG, OG, PEG, GT), 2x/day  lanolin-oxyquin-pet, hydrophil (BAG BALM) topical ointment, , Apply Topically, 2x/day PRN  levETIRAcetam (KEPPRA) tablet, 1,500  mg, Gastric (NG, OG, PEG, GT), 2x/day  lisinopril (PRINIVIL) tablet, 40 mg, Gastric (NG, OG, PEG, GT), Daily  LORazepam (ATIVAN) 2 mg/mL injection ---Cabinet Override, , ,   nicotine (NICODERM CQ) transdermal patch (mg/24 hr), 14 mg, Transdermal, Daily  NS flush syringe, 2 mL, Intracatheter, Q8HRS    And  NS flush syringe, 2-6 mL, Intracatheter, Q1 MIN PRN  nutrition protein supplement 15 g per 30 mL packet, 1 Packet, Gastric (NG, OG, PEG, GT), 2x/day  nystatin (MYCOSTATIN) 100,000 units per mL oral liquid, 5 mL, Swish & Spit, 4x/day  nystatin (NYSTOP) 100,000 units/g topical powder, , Apply Topically, 2x/day  ondansetron (ZOFRAN) 2 mg/mL injection, 4 mg, Intravenous, Q6H PRN  perflutren lipid microspheres (DEFINITY) 1.3 mL in NS 10 mL (tot vol) injection, 2 mL, Intravenous, Give in Cardiology  phenytoin (DILANTIN) 50 mg/mL injection, 100 mg, Intravenous, Q8H  potassium bicarbonate-citric acid (EFFER-K) effervescent tablet, 40 mEq, Gastric (NG, OG, PEG, GT), Daily with Breakfast  SSIP insulin lispro (HUMALOG) 100 units/mL injection, 0-12 Units, Subcutaneous, Q4H PRN  trimethoprim-sulfamethoxazole (BACTRIM DS) 160-881m per tablet, 1 Tab, Gastric (NG, OG, PEG, GT), 2x/day      Today's Physical Exam:   General: Acutely ill  Neurologic: E1 M5 V1T: does not open eyes, intubated.  PERRL, no facial weakness, + cough.  Motor: LUE localizes, tries to localize RUE  but does not cross midline; BLE withdraw. UTA sensation  Cardiovascular: Heart regular rate and rhythm  Lungs: CTAB  Abdomen: soft, non-tender and bowel sounds normal  Extremities: no cyanosis or edema  Skin: Skin warm and dry    Labs:  I have reviewed all lab results.    I/O:  I/O last 24 hours:      Intake/Output Summary (Last 24 hours) at 07/20/2018 0759  Last data filed at 07/20/2018 0000  Gross per 24 hour   Intake 1275 ml   Output --   Net 1275 ml     I/O current shift:  No intake/output data recorded.    Drips:   Current Facility-Administered Medications    Medication Dose Frequency Last Rate         Radiology Results:    CT brain 07/11/18: "1.  Stable postoperative changes from left decompressive craniectomy with persistent extracalvarial brain herniation.  2.  Persistent cerebral edema and hemorrhagic tumor resection site. No new  Hemorrhage."    MRI brain w/wo contrast 07/06/18: "acute postoperative changes related to gross total resection of a left medial tentorial meningioma with previously seen mass effect on the left thalamus, left middle cerebral peduncle and left midbrain. The current examination demonstrate hemorrhagic products are identified within the resection cavity extending into the parenchyma of the left thalamus and tracking down within the left midbrain and extending to the fourth Ventricle."        Impression and Plan:    Active Hospital Problems    Diagnosis   . 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 Tobacco use disorder   . Hypokalemia     56 year old female with progressive R sided weakness s/p left meningioma resection s/p Craniectomy for evacuation of tumor bed hematoma.       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  -- vEEG  -- Current AEDs   -- Keppra 1500 BID   -- Vimpat 200 BID   -- Phenytoin 100 TID- level 1.9   -- Onfi 20 mg TID  -- Ativan 2 mg PRN increased GPEDs  -- Will continue current AED regimen at reassess 12/29    Pain/sedation  -- Tylenol PRN      Cardiovascular:    HTN, chronic  -- SBP goal < 160  -- Hydralazine/labetalol (x1  overnight) PRN  -- Current antihypertensives:  -- Coreg 12.5 BID   -- Lisinopril 52m daily      Respiratory:    Acute respiratory failure requiring mechanical ventilation s/p tracheostomy   -- ATC  -- CXR 12/23 Well-circumscribed nodular opacity overlying the right lung base  -- Recommenced CT Chest as outpatient      Renal:   Hyponatremia, mild  -- Repeat BMP at 1200    K goal > 4, Mg goal > 2  -- Continue daily K replacement      Infectious Disease:   Leukocytosis, may be 2/2 right axilla abscess  -- Cultures:    -- Blood cultures 12/26: ngtd   --  BAL 12/25: ngtd   -- Urine Cx 12/23 - Candida albicans, replaced foley cath   -- Urine Cx 12/24: Candida Albicans, foley removed   -- Bactrim started 07/20/18, x10 days, end 07/29/2018  -- Soft tissue u/s ordered      Hematology:  Normocytic anemia   -- 1u pRBCs 12/23 for hbg 6.8  -- continue to monitor and transfuse if hb<7    DVT prophylaxis  -- SCD's / lovenox     LLE DVT  -- Peripheral Venous Duplex 07/16/18: LLE DVT (calf vein)  -- repeat in 1 week / 07/23/18 (ordered)      Gastrointestinal:   Diarrhea, resolved  -- C. Diff 12/23 - neg  -- last BM 12/26  -- Miralax and senna restarted 07/20/18    Nutrition  -- Osmolite 1.5, 1 can Q4H while awake  -- Vit C  -- Protein packet      Endocrine:  No acute issues  -- SSIP conservative  -- Glucose 110-175      Integumentary:   Right axilla abscess  -- See ID    Excoriation in inner thigh area  -- Bag balm   -- Nystatin powder BID    Psychiatric:  No acute issues  -- Monitor for ICU delirium     Prophylaxis:    DVT/PE Prophylaxis: SCDs/ Venodynes/Impulse boots  GI: NA  Mouth: Peridex    Family:  None present at bedside  Disposition/ Baseline:  ICU  Advance Directives:  None-Discussed  Hospice involvement prior to admission?  no    Code Status Information     Code Status    Full Code        Orvis Brill, PA-C, 07/20/2018, 13:03  Neurocritical Care   Department of Neurology  Pager [phone]

## 2018-07-20 NOTE — Nurses Notes (Signed)
BP (!) 79/54   Pulse (!) 104   Temp (!) 39.2 C (102.6 F)   Resp (!) 31   Ht 1.676 m (5' 5.98")   Wt 94.1 kg (207 lb 7.3 oz)   SpO2 95%   BMI 33.50 kg/m       NCCU notified.

## 2018-07-20 NOTE — Progress Notes (Signed)
[facility]  Neurocritical Care (NCCU) Progress Note    Freer, Gratia  Date of Admission:  07/05/2018  Date of Service: 07/20/2018  Date of Birth:  25-Apr-1961    Primary Attending: [name], MD   Primary Service: NEUROSURGERY 1 [place]     Chief Complaint: Right sided weakness s/p left tentorial meningioma resection      Subjective: Started on bactrim for right axilla abscess. Ativan given x2 overnight for increased GPEDs on EEG. Remains on ATC, tolerating well.     Vital Signs:  Temp (24hrs) Max:38.8 C (154.0 F)      Systolic (08QPY), PPJ:093 , Min:104 , OIZ:124     Diastolic (58KDX), IPJ:82, Min:68, Max:102    Temp  Avg: 37.9 C (100.3 F)  Min: 37 C (98.6 F)  Max: 38.8 C (101.8 F)  MAP (Non-Invasive)  Avg: 99.3 mmHG  Min: 82 mmHG  Max: 116 mmHG  Pulse  Avg: 105.3  Min: 96  Max: 119  Resp  Avg: 27.9  Min: 22  Max: 35  SpO2  Avg: 95.3 %  Min: 91 %  Max: 99 %  Pain Score (Numeric, Faces): 3    Current Medications:  acetaminophen (TYLENOL) 160 mg per 5 mL liquid - grape flavored, 650 mg, Gastric (NG, OG, PEG, GT), Q4H PRN  ascorbic acid (VITAMIN C) tablet, 500 mg, Gastric (NG, OG, PEG, GT), 2x/day  bacitracin 500 units/gram topical ointment tube, , Topical, 2x/day  carvedilol (COREG) tablet, 12.5 mg, Gastric (NG, OG, PEG, GT), 2x/day-Food  chlorhexidine gluconate (PERIDEX) 0.12% mouthwash, 15 mL, Swish & Spit, 2x/day  cloBAZam (ONFI) 2.5 mg per mL oral liquid, 20 mg, Gastric (NG, OG, PEG, GT), Q8HRS  enoxaparin PF (LOVENOX) 40 mg/0.4 mL SubQ injection, 40 mg, Subcutaneous, Q24H  fentaNYL (SUBLIMAZE) 50 mcg/mL injection, 25 mcg, Intravenous, Q1H PRN  hydrALAZINE (APRESOLINE) injection 10 mg, 10 mg, Intravenous, Q4H PRN  labetalol (TRANDATE) 5 mg/mL injection, 10 mg, Intravenous, Q1H PRN  lacosamide (VIMPAT) tablet, 200 mg, Gastric (NG, OG, PEG, GT), 2x/day  lanolin-oxyquin-pet, hydrophil (BAG BALM) topical ointment, , Apply Topically, 2x/day PRN  levETIRAcetam (KEPPRA) tablet, 1,500  mg, Gastric (NG, OG, PEG, GT), 2x/day  lisinopril (PRINIVIL) tablet, 40 mg, Gastric (NG, OG, PEG, GT), Daily  LORazepam (ATIVAN) 2 mg/mL injection ---Cabinet Override, , ,   nicotine (NICODERM CQ) transdermal patch (mg/24 hr), 14 mg, Transdermal, Daily  NS flush syringe, 2 mL, Intracatheter, Q8HRS    And  NS flush syringe, 2-6 mL, Intracatheter, Q1 MIN PRN  nutrition protein supplement 15 g per 30 mL packet, 1 Packet, Gastric (NG, OG, PEG, GT), 2x/day  nystatin (MYCOSTATIN) 100,000 units per mL oral liquid, 5 mL, Swish & Spit, 4x/day  nystatin (NYSTOP) 100,000 units/g topical powder, , Apply Topically, 2x/day  ondansetron (ZOFRAN) 2 mg/mL injection, 4 mg, Intravenous, Q6H PRN  perflutren lipid microspheres (DEFINITY) 1.3 mL in NS 10 mL (tot vol) injection, 2 mL, Intravenous, Give in Cardiology  phenytoin (DILANTIN) 50 mg/mL injection, 100 mg, Intravenous, Q8H  potassium bicarbonate-citric acid (EFFER-K) effervescent tablet, 40 mEq, Gastric (NG, OG, PEG, GT), Daily with Breakfast  SSIP insulin lispro (HUMALOG) 100 units/mL injection, 0-12 Units, Subcutaneous, Q4H PRN  trimethoprim-sulfamethoxazole (BACTRIM DS) 160-865m per tablet, 1 Tab, Gastric (NG, OG, PEG, GT), 2x/day      Today's Physical Exam:   General: Acutely ill  Neurologic: E1 M5 V1T: does not open eyes, intubated.  PERRL, no facial weakness, + cough.  Motor: LUE localizes, tries to localize RUE  but does not cross midline; BLE withdraw. UTA sensation  Cardiovascular: Heart regular rate and rhythm  Lungs: CTAB  Abdomen: soft, non-tender and bowel sounds normal  Extremities: no cyanosis or edema  Skin: Skin warm and dry    Labs:  I have reviewed all lab results.    I/O:  I/O last 24 hours:      Intake/Output Summary (Last 24 hours) at 07/20/2018 0759  Last data filed at 07/20/2018 0000  Gross per 24 hour   Intake 1275 ml   Output --   Net 1275 ml     I/O current shift:  No intake/output data recorded.    Drips:   Current Facility-Administered Medications    Medication Dose Frequency Last Rate         Radiology Results:    CT brain 07/11/18: "1.  Stable postoperative changes from left decompressive craniectomy with persistent extracalvarial brain herniation.  2.  Persistent cerebral edema and hemorrhagic tumor resection site. No new  Hemorrhage."    MRI brain w/wo contrast 07/06/18: "acute postoperative changes related to gross total resection of a left medial tentorial meningioma with previously seen mass effect on the left thalamus, left middle cerebral peduncle and left midbrain. The current examination demonstrate hemorrhagic products are identified within the resection cavity extending into the parenchyma of the left thalamus and tracking down within the left midbrain and extending to the fourth Ventricle."        Impression and Plan:    Active Hospital Problems    Diagnosis   . 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 Tobacco use disorder   . Hypokalemia     57 year old female with progressive R sided weakness s/p left meningioma resection s/p Craniectomy for evacuation of tumor bed hematoma.       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  -vEEG  -Current AEDs   -Keppra 1500 BID   -Vimpat 200 BID   -Phenytoin 100 TID- level 1.9   -Onfi 14m TID    Pain/sedation  -- Tylenol PRN    Cardiovascular:    HTN, chronic  -- SBP goal < 160  -- Hydralazine/labetalol (x1 overnight) PRN  -- Current antihypertensives:  -- Coreg 12.5 BID   -- Lisinopril 59mdaily      Respiratory:    Acute respiratory failure requiring mechanical ventilation  s/p tracheostomy   -- ATC  -- CXR 12/23 Well-circumscribed nodular opacity overlying the right lung base  -- Recommenced CT Chest as outpatient      Renal:   Hyponatremia, mild  -- Repeat BMP at 1200    K goal > 4, Mg goal > 2  -- Continue daily K replacement      Infectious Disease:   Leukocytosis, may be 2/2 right axilla abscess  -- Cultures:    -- Blood cultures 12/26: ngtd   -- BAL 12/25: ngtd   -- Urine Cx 12/23 - Candida albicans, replaced foley cath   -- Urine Cx 12/24: Candida Albicans, foley removed   -- Bactrim started 07/20/18, x10 days, end  07/29/2018  -- Will f/u for appropriate imaging regarding abscess      Hematology:  Normocytic anemia   -- 1u pRBCs 12/23 for hbg 6.8  -- continue to monitor and transfuse if hb<7    DVT prophylaxis  -- SCD's / lovenox     LLE DVT  Peripheral Venous Duplex 07/16/18: LLE DVT (calf vein)  --repeat in 1 week / 07/23/18 (ordered)      Gastrointestinal:   Diarrhea, resolved  - C. Diff 12/23 - neg  - last BM 12/26  -- Miralax and senna restarted 07/20/18    Nutrition  - Osmolite 1.5, 1 can Q4H while awake  - Vit C  - Protein packet      Endocrine:  No acute issues  -- SSIP conservative  -- Glucose 110-175      Integumentary:   Right axilla abscess  -- See ID    Excoriation in inner thigh area  -Bag balm   -Nystatin powder BID    Psychiatric:  No acute issues  -- Monitor for ICU delirium     Prophylaxis:    DVT/PE Prophylaxis: SCDs/ Venodynes/Impulse boots  GI: NA  Mouth: Peridex    Family:  None present at bedside  Disposition/ Baseline:  ICU  Advance Directives:  None-Discussed  Hospice involvement prior to admission?  no    Code Status Information     Code Status    Full Code            Orvis Brill, PA-C, 07/20/2018, 08:14  Neurocritical Care   Department of Neurology  Pager [phone]  I personally saw and evaluated the patient. See mid-level's note for additional details. My findings/participation are   Patient reacts to stimuli  Not following  ATC currently  EEG with  multiple abnormal waves of unclear significance  Will continue EEG and AEDs for at least one day before changing.  Otherwise Plan as Above in Resident or Midlevel's Note.     Valora Corporal, MD

## 2018-07-20 NOTE — Nurses Notes (Signed)
Patient to MRI with acuity RN.

## 2018-07-21 ENCOUNTER — Inpatient Hospital Stay (HOSPITAL_COMMUNITY): Payer: PPO

## 2018-07-21 DIAGNOSIS — Z930 Tracheostomy status: Secondary | ICD-10-CM

## 2018-07-21 DIAGNOSIS — Z452 Encounter for adjustment and management of vascular access device: Secondary | ICD-10-CM

## 2018-07-21 DIAGNOSIS — J9811 Atelectasis: Secondary | ICD-10-CM

## 2018-07-21 DIAGNOSIS — R918 Other nonspecific abnormal finding of lung field: Secondary | ICD-10-CM

## 2018-07-21 DIAGNOSIS — I517 Cardiomegaly: Secondary | ICD-10-CM

## 2018-07-21 LAB — POC BLOOD GLUCOSE (RESULTS)
GLUCOSE, POC: 107 mg/dL — ABNORMAL HIGH (ref 70–105)
GLUCOSE, POC: 123 mg/dL — ABNORMAL HIGH (ref 70–105)
GLUCOSE, POC: 197 mg/dL — ABNORMAL HIGH (ref 70–105)
GLUCOSE, POC: 90 mg/dL (ref 70–105)

## 2018-07-21 LAB — BASIC METABOLIC PANEL
ANION GAP: 10 mmol/L (ref 4–13)
BUN/CREA RATIO: 23 — ABNORMAL HIGH (ref 6–22)
BUN: 14 mg/dL (ref 8–25)
CALCIUM: 8.3 mg/dL — ABNORMAL LOW (ref 8.5–10.2)
CHLORIDE: 101 mmol/L (ref 96–111)
CO2 TOTAL: 23 mmol/L (ref 22–32)
CREATININE: 0.62 mg/dL (ref 0.49–1.10)
ESTIMATED GFR: >60 mL/min/1.73mˆ2 (ref >60)
GLUCOSE: 152 mg/dL — ABNORMAL HIGH (ref 65–139)
POTASSIUM: 4 mmol/L (ref 3.5–5.1)
SODIUM: 134 mmol/L — ABNORMAL LOW (ref 136–145)

## 2018-07-21 LAB — URINALYSIS, MACROSCOPIC
BILIRUBIN: NEGATIVE mg/dL
BLOOD: NEGATIVE mg/dL
COLOR: NORMAL
GLUCOSE: NEGATIVE mg/dL
KETONES: NEGATIVE mg/dL
LEUKOCYTES: NEGATIVE WBCs/uL
NITRITE: NEGATIVE
PH: 8 (ref 5.0–8.0)
PROTEIN: NEGATIVE mg/dL
SPECIFIC GRAVITY: 1.016 (ref 1.005–1.030)
UROBILINOGEN: NEGATIVE mg/dL

## 2018-07-21 LAB — URINALYSIS, MICROSCOPIC
RBCS: 2 /HPF (ref <6.0)
WBCS: 1 /HPF (ref <11.0)

## 2018-07-21 LAB — CBC
HCT: 27.8 % — ABNORMAL LOW (ref 34.8–46.0)
HGB: 8.9 g/dL — ABNORMAL LOW (ref 11.5–16.0)
MCH: 28.9 pg (ref 26.0–32.0)
MCHC: 32 g/dL (ref 31.0–35.5)
MCV: 90.3 fL (ref 78.0–100.0)
MPV: 9.3 fL (ref 8.7–12.5)
PLATELETS: 547 x10ˆ3/uL — ABNORMAL HIGH (ref 150–400)
RBC: 3.08 x10ˆ6/uL — ABNORMAL LOW (ref 3.85–5.22)
RDW-CV: 15.1 % (ref 11.5–15.5)
WBC: 9.7 x10ˆ3/uL (ref 3.7–11.0)

## 2018-07-21 LAB — PHOSPHORUS: PHOSPHORUS: 3.5 mg/dL (ref 2.4–4.7)

## 2018-07-21 LAB — MAGNESIUM: MAGNESIUM: 1.9 mg/dL (ref 1.6–2.6)

## 2018-07-21 MED ORDER — FENTANYL (PF) 50 MCG/ML INJECTION SOLUTION
INTRAMUSCULAR | Status: AC
Start: 2018-07-21 — End: 2018-07-22
  Filled 2018-07-21: qty 2

## 2018-07-21 MED ORDER — SODIUM CHLORIDE 0.9 % IV BOLUS
1000.0000 mL | INJECTION | Freq: Once | Status: AC
Start: 2018-07-21 — End: 2018-07-21
  Administered 2018-07-21: 22:00:00 0 mL via INTRAVENOUS
  Administered 2018-07-21: 1000 mL via INTRAVENOUS

## 2018-07-21 MED ORDER — CEFEPIME 2 GRAM SOLUTION FOR INJECTION
2.0000 g | Freq: Three times a day (TID) | INTRAMUSCULAR | Status: DC
Start: 2018-07-22 — End: 2018-07-25
  Administered 2018-07-22 (×2): 0 g via INTRAVENOUS
  Administered 2018-07-22: 2 g via INTRAVENOUS
  Administered 2018-07-22: 0 g via INTRAVENOUS
  Administered 2018-07-22 (×2): 2 g via INTRAVENOUS
  Administered 2018-07-23: 0 g via INTRAVENOUS
  Administered 2018-07-23: 2 g via INTRAVENOUS
  Administered 2018-07-23: 0 g via INTRAVENOUS
  Administered 2018-07-23: 2 g via INTRAVENOUS
  Administered 2018-07-23: 0 g via INTRAVENOUS
  Administered 2018-07-23: 2 g via INTRAVENOUS
  Administered 2018-07-24: 0 g via INTRAVENOUS
  Administered 2018-07-24: 2 g via INTRAVENOUS
  Administered 2018-07-24: 0 g via INTRAVENOUS
  Administered 2018-07-24 (×2): 2 g via INTRAVENOUS
  Administered 2018-07-24 – 2018-07-25 (×2): 0 g via INTRAVENOUS
  Administered 2018-07-25: 2 g via INTRAVENOUS
  Administered 2018-07-25: 0 g via INTRAVENOUS
  Administered 2018-07-25: 2 g via INTRAVENOUS
  Filled 2018-07-21 (×12): qty 12.5

## 2018-07-21 MED ORDER — MAGNESIUM CITRATE ORAL SOLUTION
296.00 mL | ORAL | Status: AC
Start: 2018-07-21 — End: 2018-07-21
  Administered 2018-07-21: 296 mL via GASTROSTOMY
  Filled 2018-07-21: qty 296

## 2018-07-21 MED ORDER — SODIUM CHLORIDE 0.9 % IV BOLUS
500.0000 mL | INJECTION | Freq: Once | Status: AC
Start: 2018-07-21 — End: 2018-07-21
  Administered 2018-07-21: 500 mL via INTRAVENOUS
  Administered 2018-07-21: 20:00:00 0 mL via INTRAVENOUS

## 2018-07-21 MED ORDER — VANCOMYCIN 10 GRAM INTRAVENOUS SOLUTION
18.0000 mg/kg | INTRAVENOUS | Status: DC
Start: 2018-07-21 — End: 2018-07-22
  Administered 2018-07-21: 0 mg via INTRAVENOUS
  Administered 2018-07-21: 1250 mg via INTRAVENOUS
  Filled 2018-07-21 (×2): qty 12.5

## 2018-07-21 MED ORDER — SODIUM CHLORIDE 0.9 % (FLUSH) INJECTION SYRINGE
10.00 mL | INJECTION | Freq: Three times a day (TID) | INTRAMUSCULAR | Status: DC
Start: 2018-07-21 — End: 2018-07-30
  Administered 2018-07-21 – 2018-07-23 (×5): 10 mL via INTRAVENOUS
  Administered 2018-07-23: 0 mL via INTRAVENOUS
  Administered 2018-07-23 – 2018-07-26 (×8): 10 mL via INTRAVENOUS
  Administered 2018-07-26: 30 mL via INTRAVENOUS
  Administered 2018-07-26: 0 mL via INTRAVENOUS
  Administered 2018-07-27 (×2): 10 mL via INTRAVENOUS
  Administered 2018-07-28 (×3): 0 mL via INTRAVENOUS
  Administered 2018-07-29: 10 mL via INTRAVENOUS
  Administered 2018-07-29: 0 mL via INTRAVENOUS
  Administered 2018-07-29: 10 mL via INTRAVENOUS
  Administered 2018-07-30 (×2): 0 mL via INTRAVENOUS

## 2018-07-21 MED ORDER — IBUPROFEN 400 MG TABLET
400.0000 mg | ORAL_TABLET | Freq: Four times a day (QID) | ORAL | Status: DC | PRN
Start: 2018-07-21 — End: 2018-08-08
  Administered 2018-07-21 – 2018-07-31 (×3): 400 mg via GASTROSTOMY
  Filled 2018-07-21 (×5): qty 1

## 2018-07-21 MED ORDER — SODIUM CHLORIDE 0.9 % (FLUSH) INJECTION SYRINGE
20.00 mL | INJECTION | INTRAMUSCULAR | Status: DC | PRN
Start: 2018-07-21 — End: 2018-07-30

## 2018-07-21 MED ORDER — VANCOMYCIN IV - PHARMACIST TO DOSE PER PROTOCOL
Freq: Every day | Status: DC | PRN
Start: 2018-07-21 — End: 2018-07-24

## 2018-07-21 MED ORDER — ELECTROLYTE-A INTRAVENOUS SOLUTION BOLUS
1000.00 mL | INTRAVENOUS | Status: AC
Start: 2018-07-21 — End: 2018-07-21
  Administered 2018-07-21: 1000 mL via INTRAVENOUS
  Administered 2018-07-21: 0 mL via INTRAVENOUS

## 2018-07-21 MED ORDER — CEFEPIME 2 GRAM SOLUTION FOR INJECTION
2.0000 g | INTRAMUSCULAR | Status: AC
Start: 2018-07-21 — End: 2018-07-21
  Administered 2018-07-21: 2 g via INTRAVENOUS
  Administered 2018-07-21: 0 g via INTRAVENOUS
  Filled 2018-07-21: qty 12.5

## 2018-07-21 MED ORDER — FENTANYL (PF) 50 MCG/ML INJECTION SOLUTION
50.0000 ug | INTRAMUSCULAR | Status: AC
Start: 2018-07-22 — End: 2018-07-21
  Administered 2018-07-21: 50 ug via INTRAVENOUS

## 2018-07-21 MED ADMIN — glycerin-witch hazeL 12.5 %-50 % topical pads: GASTROSTOMY | @ 15:00:00 | NDC 50289325001

## 2018-07-21 MED ADMIN — nystatin 100,000 unit/mL oral suspension: ORAL | @ 18:00:00

## 2018-07-21 MED ADMIN — bacitracin 500 unit/gram topical ointment: TOPICAL | @ 20:00:00

## 2018-07-21 MED ADMIN — phenytoin 100 mg/4 mL oral suspension: GASTROSTOMY | @ 15:00:00

## 2018-07-21 MED ADMIN — sodium chloride 0.9 % (flush) injection syringe: @ 06:00:00

## 2018-07-21 MED ADMIN — electrolyte-A intravenous solution: GASTROSTOMY | @ 08:00:00 | NDC 00338022104

## 2018-07-21 MED ADMIN — metoprolol tartrate 25 mg tablet: SUBCUTANEOUS | @ 11:00:00

## 2018-07-21 MED ADMIN — sodium chloride 0.45 % intravenous solution: GASTROSTOMY | @ 08:00:00 | NDC 00338004304

## 2018-07-21 NOTE — Nurses Notes (Signed)
BP (!) 117/58   Pulse (!) 120   Temp (!) 39.4 C (102.9 F)   Resp (!) 35   Ht 1.676 m (5' 5.98")   Wt 88.9 kg (195 lb 15.8 oz)   SpO2 97%   BMI 32.61 kg/m       Patient remains febrile. Webb Silversmith of NCCU notified.

## 2018-07-21 NOTE — Progress Notes (Signed)
[facility]  Neurocritical Care (NCCU) Progress Note    Jarriel, Sheketa  Date of Admission:  07/05/2018  Date of Service: 07/21/2018  Date of Birth:  05/02/1961    Primary Attending: [name], MD   Primary Service: NEUROSURGERY 1 [place]     Chief Complaint: Right sided weakness s/p left tentorial meningioma resection      Subjective: Febrile overnight. Intermittent tachycardia with one episode of hypotension; 500 cc bolus of NS given. Subocclusive thrombus noted in left transverse sinus on CT venogram.    Vital Signs:  Temp (24hrs) Max:39.2 C (161.0 F)      Systolic (96EAV), WUJ:811 , Min:79 , BJY:782     Diastolic (95AOZ), HYQ:65, Min:54, Max:97    Temp  Avg: 38.2 C (100.7 F)  Min: 36.9 C (98.4 F)  Max: 39.2 C (102.6 F)  MAP (Non-Invasive)  Avg: 85.1 mmHG  Min: 63 mmHG  Max: 114 mmHG  Pulse  Avg: 104.3  Min: 93  Max: 120  Resp  Avg: 29.1  Min: 22  Max: 34  SpO2  Avg: 94 %  Min: 88 %  Max: 98 %  Pain Score (Numeric, Faces): 3    Current Medications:  acetaminophen (TYLENOL) 160 mg per 5 mL liquid - grape flavored, 650 mg, Gastric (NG, OG, PEG, GT), Q4H PRN  ascorbic acid (VITAMIN C) tablet, 500 mg, Gastric (NG, OG, PEG, GT), 2x/day  bacitracin 500 units/gram topical ointment tube, , Topical, 2x/day  carvedilol (COREG) tablet, 12.5 mg, Gastric (NG, OG, PEG, GT), 2x/day-Food  chlorhexidine gluconate (PERIDEX) 0.12% mouthwash, 15 mL, Swish & Spit, 2x/day  cloBAZam (ONFI) 2.5 mg per mL oral liquid, 20 mg, Gastric (NG, OG, PEG, GT), Q8HRS  enoxaparin PF (LOVENOX) 40 mg/0.4 mL SubQ injection, 40 mg, Subcutaneous, Q24H  fentaNYL (SUBLIMAZE) 50 mcg/mL injection, 25 mcg, Intravenous, Q1H PRN  hydrALAZINE (APRESOLINE) injection 10 mg, 10 mg, Intravenous, Q4H PRN  labetalol (TRANDATE) 5 mg/mL injection, 10 mg, Intravenous, Q1H PRN  lacosamide (VIMPAT) tablet, 200 mg, Gastric (NG, OG, PEG, GT), 2x/day  lanolin-oxyquin-pet, hydrophil (BAG BALM) topical ointment, , Apply Topically, 2x/day  PRN  levETIRAcetam (KEPPRA) tablet, 1,500 mg, Gastric (NG, OG, PEG, GT), 2x/day  nicotine (NICODERM CQ) transdermal patch (mg/24 hr), 14 mg, Transdermal, Daily  NS flush syringe, 2 mL, Intracatheter, Q8HRS    And  NS flush syringe, 2-6 mL, Intracatheter, Q1 MIN PRN  nutrition protein supplement 15 g per 30 mL packet, 1 Packet, Gastric (NG, OG, PEG, GT), 2x/day  nystatin (MYCOSTATIN) 100,000 units per mL oral liquid, 5 mL, Swish & Spit, 4x/day  nystatin (NYSTOP) 100,000 units/g topical powder, , Apply Topically, 2x/day  ondansetron (ZOFRAN) 2 mg/mL injection, 4 mg, Intravenous, Q6H PRN  perflutren lipid microspheres (DEFINITY) 1.3 mL in NS 10 mL (tot vol) injection, 2 mL, Intravenous, Give in Cardiology  phenytoin (DILANTIN) 100 mg per 4 mL oral liquid, 100 mg, Gastric (NG, OG, PEG, GT), Q8HRS  polyethylene glycol (MIRALAX) oral packet, 17 g, Gastric (NG, OG, PEG, GT), Daily  potassium bicarbonate-citric acid (EFFER-K) effervescent tablet, 40 mEq, Gastric (NG, OG, PEG, GT), Daily with Breakfast  senna concentrate (SENNA) 553m per 186moral liquid, 10 mL, Gastric (NG, OG, PEG, GT), 2x/day  SSIP insulin lispro (HUMALOG) 100 units/mL injection, 0-12 Units, Subcutaneous, Q4H PRN  trimethoprim-sulfamethoxazole (BACTRIM DS) 160-80061mer tablet, 1 Tab, Gastric (NG, OG, PEG, GT), 2x/day      Today's Physical Exam:   General: Acutely ill  Neurologic: E1 M5 V1T: does  not open eyes, intubated.  PERRL, no facial weakness, + cough.  Motor: LUE localizes, tries to localize RUE but does not cross midline; BLE withdraw. UTA sensation  Cardiovascular: Heart regular rate and rhythm  Lungs: CTAB  Abdomen: soft, non-tender and bowel sounds normal  Extremities: no cyanosis or edema  Skin: Skin warm and dry    Labs:  I have reviewed all lab results.    I/O:  I/O last 24 hours:      Intake/Output Summary (Last 24 hours) at 07/21/2018 0708  Last data filed at 07/21/2018 0000  Gross per 24 hour   Intake 2534 ml   Output --   Net 2534 ml      I/O current shift:  No intake/output data recorded.    Drips:   Current Facility-Administered Medications   Medication Dose Frequency Last Rate         Radiology Results:    CT brain 07/11/18: "1.  Stable postoperative changes from left decompressive craniectomy with persistent extracalvarial brain herniation.  2.  Persistent cerebral edema and hemorrhagic tumor resection site. No new  Hemorrhage."    MRI brain w/wo contrast 07/06/18: "acute postoperative changes related to gross total resection of a left medial tentorial meningioma with previously seen mass effect on the left thalamus, left middle cerebral peduncle and left midbrain. The current examination demonstrate hemorrhagic products are identified within the resection cavity extending into the parenchyma of the left thalamus and tracking down within the left midbrain and extending to the fourth Ventricle."        Impression and Plan:    Active Hospital Problems    Diagnosis   . 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 Tobacco use disorder   . Hypokalemia     57 year old female with progressive R sided weakness s/p left meningioma resection s/p Craniectomy for evacuation of tumor bed hematoma.       Neurologic:  L Tentorial meningioma s/p craniotomy for tumor resection, s/p L fronto-temporal craniectomy with evacuation for tumor bed hemorrhage  -- Q2H neuro checks   -- MRI brain w/wo contrast 07/20/18: Post-op changes from meningioma resection, possible subocclusive thrombus in left transverse sinus  -- CT venogram 07/21/18: Confirmation of subocclusive left transverse sinus thrombus  -- STAT CT brain without contrast for any acute change in neurological status   -- Started amantadine 12/23, - D/c'd 12/24 d/t increased seizures    Multiple generalized seizures on vEEG  -- vEEG; no seizures overnight  -- Current AEDs   -- Keppra 1500 BID   -- Vimpat 200 BID   -- Phenytoin 100 TID   -- Onfi  5m TID    Pain/sedation  -- Tylenol PRN      Cardiovascular:    HTN, chronic  -- SBP goal < 160  -- Hydralazine/labetalol PRN  -- Current antihypertensives:   -- Coreg 12.5 mg BID       Respiratory:    Acute respiratory failure requiring mechanical ventilation s/p tracheostomy   -- ATC  -- CXR 12/23 Well-circumscribed nodular opacity overlying the right lung base  -- Recommenced CT Chest as outpatient      Renal:   Hyponatremia, mild  -- Daily BMP    K goal > 4, Mg goal > 2  -- Continue daily K replacement      Infectious Disease:   Leukocytosis, may be 2/2 right axilla abscess  -- Cultures:    --  Blood cultures 12/26: ngtd   -- BAL 12/25: ngtd   -- Urine Cx 12/23 - Candida albicans, replaced foley cath   -- Urine Cx 12/24: Candida Albicans, foley removed   -- Bactrim started 07/20/18, x10 days, end 07/29/2018  -- Ultrasound 07/20/18 of R axilla completed, will f/u read      Hematology:  Normocytic anemia   -- 1u pRBCs 12/23 for hbg 6.8  -- continue to monitor and transfuse if hb<7    DVT prophylaxis  -- SCD's / lovenox     LLE DVT  -- Peripheral Venous Duplex 07/16/18: LLE DVT (calf vein)  -- Duplexes upper and lower extremities ordered for 07/23/18      Gastrointestinal:   Diarrhea, resolved  -- C. Diff 12/23 - neg  -- last BM 12/26  -- Miralax and senna restarted 07/20/18  -- Mag citrate ordered 07/21/18    Nutrition  -- Osmolite 1.5, 1 can Q4H while awake  -- Vit C  -- Protein packet      Endocrine:  No acute issues  -- SSIP conservative  -- Glucose 90-155      Integumentary:   Right axilla abscess  -- See ID    Excoriation in inner thigh area  -- Bag balm   -- Nystatin powder BID      Psychiatric:  No acute issues  -- Monitor for ICU delirium       Prophylaxis:    DVT/PE Prophylaxis: SCDs, lovenox  GI: NA  Mouth: Peridex      Family:  None present at bedside  Disposition/ Baseline:  ICU  Advance Directives:  None-Discussed  Hospice involvement prior to admission?  no    Code Status Information     Code Status     Full Code            Orvis Brill, PA-C, 07/21/2018, 07:20  Neurocritical Care   Department of Neurology  Pager [phone]    I personally saw and evaluated the patient. See mid-level's note for additional details. My findings/participation are     Remains poorly responsive  EEG with continued diffuse discharges of unclear significance  Will continue EEG and AEDs for now  Will avoid escalation of medications unless she develops clear seizures  Otherwise Plan as Above in Resident or Midlevel's Note.       Valora Corporal, MD

## 2018-07-21 NOTE — Care Plan (Signed)
Plan of care updated with patient and family (daughters). Patient continues to be febrile. Sepsis workup ordered and completed. Temp-probe foley placed and cooling blanket used to assist in cooling patient. Flexiseal intact and draining liquid, brown stool. PIV infusing intermittent boluses and abx. Patient suctioned as needed. Tracheostomy site red and broken down. Extra care taken with cleaning and monitoring site. Breath sounds coarse and diminished. Neuro checks Q2 remain 1-5-1. P&V Q2-4 as patient tolerates. Central line to be placed tonight. Education and discharge planning to continue.

## 2018-07-21 NOTE — Care Plan (Addendum)
Pt s/p L tentorial meningioma resection and subsequent crani related to post-op hematoma & swelling. GCS 1/5/1. CT repeated 07/20/18 @ 2230. Results again show subocclusive L transverse sinus thrombus. Otherwise, appearance is unchanged. Pt remained normotensive throughout night. Spiked 38.9 fever (ice bags, Tylenol). Ultrasound-guided PIV placed in R forearm that flushes (and draws w/ a tourniquet). Pt continues to suction for thick, creamy secretions that have become malodorous. Localizing only in LUE, withdrawing in all other extremities. Aggressive pulmonary toileting, including P&V bed and oral care w/ chlorhexidine, performed q4h. Turned and repositioned q2h to prevent  skin breakdown. Nystatin powder and moisture barrier creams applied to perineal area to help with moisture-related skin breakdown.      Patient Goals Based on Modifiable Risk Factors    The following modifiable risk factors were discussed with patient:     Hypertension    Goal:   Maintain blood pressure less than 140/90 (or less than 130/90 for patients with diabetes or chronic kidney disease).    Patient's selected lifestyle modification(s):  Routinely take antihypertensive medications as prescribed  Maintain consistent aerobic physical activity  Support weight loss with a diet of increased fruits and vegetables and low-fat dairy products    Patient/Family/Caregiver response to plan:    Patient unable to respond (trach).      Problem: Adult Inpatient Plan of Care  Goal: Patient-Specific Goal (Individualization)  Outcome: Ongoing (see interventions/notes)  Flowsheets (Taken 07/21/2018 0205)  Anxieties, Fears or Concerns: Daughters are concerned that secondary issues (e.g. axillary abscess, possible DVTs) are going to cause "bigger" problems (they don't feel other issues are being addressed adequately).  Patient-Specific Goals (Include Timeframe): Pt will remain free of subclinical seizure activity through 07/21/18     Problem:  Ventilator-Induced Lung Injury (Mechanical Ventilation, Invasive)  Goal: Absence of Ventilator-Induced Lung Injury  Outcome: Ongoing (see interventions/notes)     Problem: Seizure, Active Management  Goal: Absence of Seizure/Seizure-Related Injury  Outcome: Ongoing (see interventions/notes)

## 2018-07-21 NOTE — Procedures (Signed)
[facility]  Department of Neurosurgery  Procedure Note    Corlene Sabia  [id number]  1960/10/08    Date: 07/21/2018  Time: 2249    Preoperative Diagnosis: Sepsis with hypotension  Postoperative Diagnosis: Sepsis with hypotension    Procedure:  Central line insertion in the right internal jugular vein.    Resident: Hipolito Bayley, MD PhD     Indication: venous access    Description of Procedure: Consent not obtained as procedure was done emergently due to hypotension. A surgical timeout was performed to correctly identify the correct patient, procedure, and site. The patient was prepped with chloraprep and draped in normal sterile fashion. The skin was anesthetized with lidocaine. The introducer needle was then used to access the right internal jugular vein. Dark, nonpulsatile blood was returned. A wire was then passed through the introducer needle. Using the Seldinger technique, the dilator was passed followed by the triple  lumen catheter. All lumens had excellent return of blood and were easily flushed. The catheter was secured to the skin with simple interrupted silk sutures. A sterile dressing was applied. Ultrasound guidance was needed. A chest x-ray was ordered. The patient tolerated the procedure without any immediate complications.    Hipolito Bayley, MD, PhD  07/21/2018, 22:53

## 2018-07-21 NOTE — Respiratory Therapy (Signed)
Pt remains on 28% ATC today without difficulties.  Bronch brush sent, per orders.  Pt's trach cuff inflated due to pt aspirating her saliva.

## 2018-07-21 NOTE — Progress Notes (Signed)
[facility]  NEUROSURGERY   PROGRESS NOTE    [name], 57 y.o. female  Date of Admission:  07/05/2018  Date of Service: 07/22/2018  Date of Birth:  01-26-1961     Chief Complaint: right upper and lower extremity weakness    Subjective:   Concern for septic shock for which antibiotics broadened for empiric coverage. Infectious work-up ordered, central line placed, Foley catheter placement and FMS placed. Levophed started for mild hypotension. Ultrasound of the right axilla obtained demonstrating concern for cellulitis.    Vital Signs:  Temp (24hrs) Max:39.5 C (277.4 F)      Systolic (12INO), MVE:720 , Min:82 , NOB:096     Diastolic (28ZMO), QHU:76, Min:48, Max:99    Temp  Avg: 38.6 C (101.5 F)  Min: 36.7 C (98.1 F)  Max: 39.5 C (103.1 F)  MAP (Non-Invasive)  Avg: 75.1 mmHG  Min: 59 mmHG  Max: 107 mmHG  Pulse  Avg: 105.8  Min: 84  Max: 125  Resp  Avg: 27.5  Min: 16  Max: 35  SpO2  Avg: 95.4 %  Min: 87 %  Max: 100 %  Pain Score (Numeric, Faces): 3    Min/Max/Avg ICP/CPP last 24hrs:   No data recorded    Today's Physical Exam:    Appears acutely ill, intubated  GCS _0 T  -- Eyes do not open to noxious stimuli  -- Localizes in LUE, Withdraws in RUE and BLE  PERRL  Face symmetric  +cough    Unable to assess hearing  Unable to assess speech  Unable to assess fund of knowledge  Unable to assess attention span & concentration  Unable to assess recent and remote memory  CN _1 EOMI Unable to assess  CN 11 shrug symmetric Unable to assess  Muscle Strength Unable to assess  Unable to assess drift    -- L fronto-temporal incision c/d/i, staples removed  -- JP drain Rapide  -- LD Rapide    Assessment/Plan:  [name] is a 57 yo F PMH HTN, HLD w/ 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  POD17/17.  -- FU video EEG   -- Keppra 1500 mg 2x/day   -- Vimpat 200 mg 2x/day   -- Dilantin 100 mg q8h   -- Clobazam 20 mg q8h   -- Will need outpatient neurology follow-up  -- Please bathe patient daily and clean wound daily  -- Okay for soap/shampoo  -- Apply bacitracin ointment BID, cleaning old ointment off before application of new ointment  -- Final pathology: WHO grade I meningioma  -- FU blood pressure, Levophed in place   -- Previously started Carvedilol 12.5 mg 2x/day and Lisinopril 40 mg daily held    -- Imaging:   -- Venous duplex LLE (07/23/18): ORDERED   -- CTV intra w/wo (07/20/18): sub-occlusive left transverse sinus thrombus   -- US soft tissue axilla (07/20/18): nonspecific area of increased echogenicity seen at the site of concern in the right axilla measuring approximately 6.5 x 2.0 x 8.9 cm; appears nonmass-like with hazy borders and is likely related to cellulitis   -- MRI-brain w/wo (07/20/18): possible non-occlusive thrombus in transverse sinus   -- CT-brain wo (07/19/18): interval decrease of hematoma   -- Venous duplex BLE (07/16/18): left peroneal (calf vein) DVT   -- Venous duplex BUE (  07/16/18): no evidence of DVT   -- TTE (07/12/18): EF 75%, no regional wall abnormalities   -- CT brain w/o (07/11/18): stable    -- CT brain w/o (07/09/18 @ 1328):stable   -- CT brain w/o (07/09/18 @ 0430):stable   -- CT brain w/o (07/08/18):postop changes from decompressive craniectomy with interval mild extracalvarial brain herniation through the craniectomy site; mild increase in cerebral edema at the tumor resection bed and adjacent parenchyma without significant increase in hemorrhage   -- MRI brain w/wo (07/06/18):gross total resection of a left medial tentorial meningioma; hemorrhagic productswithin the resection cavity extending into the parenchyma of the left thalamus and tracking down within the left midbrain and extending to the fourth ventricle   -- CT brain w/o  (07/06/18): postop changes from left decompressive craniectomy for evacuation of hematoma of tumor bed with residual hemorrhage and improved edema and rightward midline shift  -- CT brain w/o (07/06/18): postop changes from decompressive craniectomy for evacuation of tumor bed hematoma with minimal residual blood at the surgical site   -- CT brain w/o (07/05/18): postop changes with development of hematoma at tumor bed and 8 mm of midline shift  -- Pain/spasm control: Tylenol PRN, Fentanyl PRN, Motrin PRN  -- Diet: NPO + Osmolite TF  -- Bowel regimen held, FMS in place  -- Abx: C/V/F empirically for septic shock, started 07/21/18   -- Bronch Brush (07/21/18): 1+ PMN, NGTD   -- Blood Cx (07/21/18): in process   -- UA (07/21/18): WNL   -- BAL (07/17/18): 15000 Anginosus group Viridans Streptococcus   -- CDiff (07/15/18): negative   -- Urine Cx (07/16/18): C. albicans  -- Activity: up in chair with assist   -- Craniectomy precautions, helmet to be worn at all times when OOB  -- PT/OT: SNF vs LTAC  -- DVT ppx: Lovenox, SCDs/Venodynes  -- Consults:    NCCU/Neurology   -- Will need outpatient neurology follow-up for AED management   Trauma   -- Trach/PEG completed 07/15/18  -- Lines/Drains: Trach/PEG, FMS, R IJ, Foley catheter, cooling blanket  -- Wound: L fronto-temporal incision (staples removed), JP drain Rapide, LD Rapide, helmet  -- Disposition: ongoing    Azeem A. Laural Golden, M.D.  PGY-4 Neurosurgery  07/22/2018, 00:54       I saw and examined the patient.  I reviewed the resident's note.  I agree with the findings and plan of care as documented in the resident's note.  Any exceptions/additions are edited/noted.    Vincente Poli, MD

## 2018-07-21 NOTE — Nurses Notes (Signed)
Frequent liquid stools. NCCU notified. Order for [facility] placed.

## 2018-07-21 NOTE — Nurses Notes (Signed)
Informed NCCU Ulice Bold that pt's temp dipped to 35.3, even though cooling blanket was turned off. Hypo/hyperthermia blanket was turned back on but turned to warm the pt. Two heated blankets were applied over the warming blanket. Will continue to monitor.

## 2018-07-21 NOTE — Pharmacy Vancomycin Dosing (Signed)
[facility] / Department of Pharmaceutical Services  Therapeutic Drug Monitoring: Vancomycin  07/21/2018      Patient name: [name], [name]  Date of Birth:  [birth year]    Actual Weight:  Weight: 88.9 kg (195 lb 15.8 oz) (07/21/18 0400)     BMI:  BMI (Calculated): 31.71 (07/21/18 0400)    Date RPh Current regimen (including mg/kg) Indication Target Levels (mcg/mL) SCr (mg/dL) CrCl* (mL/min) Measured level (mcg/mL) Plan (including when levels are due) Comments   12/29 JAG -- Empiric Therapy/  SSTI  13-17 0.62 109.7 -- Start Vancomycin 18 mg/kg (1250 mg) Q12hr    Level prior to 4th dose or as clinically indicated                                                                               *Creatinine clearance is estimated by using the Cockcroft-Gault equation for adult patients and the Carol Ada for pediatric patients.    The decision to discontinue vancomycin therapy will be determined by the primary service.  Please contact the pharmacist with any questions regarding this patient's medication regimen.

## 2018-07-21 NOTE — Nurses Notes (Signed)
BP (!) 82/48   Pulse (!) 103   Temp (!) 39.4 C (102.9 F)   Resp (!) 28   Ht 1.676 m (5' 5.98")   Wt 88.9 kg (195 lb 15.8 oz)   SpO2 95%   BMI 32.61 kg/m       NCCU notified. Will place orders.

## 2018-07-21 NOTE — Nurses Notes (Signed)
Patient remains febrile @ 39.4. Cooling blanket ordered. Goal temp of 36.8 per Ryan of NCCU.

## 2018-07-22 LAB — POC BLOOD GLUCOSE (RESULTS)
GLUCOSE, POC: 125 mg/dL — ABNORMAL HIGH (ref 70–105)
GLUCOSE, POC: 126 mg/dL — ABNORMAL HIGH (ref 70–105)
GLUCOSE, POC: 90 mg/dL (ref 70–105)
GLUCOSE, POC: 92 mg/dL (ref 70–105)
GLUCOSE, POC: 95 mg/dL (ref 70–105)

## 2018-07-22 LAB — BASIC METABOLIC PANEL
ANION GAP: 6 mmol/L (ref 4–13)
BUN/CREA RATIO: 30 — ABNORMAL HIGH (ref 6–22)
BUN: 16 mg/dL (ref 8–25)
CALCIUM: 8 mg/dL — ABNORMAL LOW (ref 8.5–10.2)
CHLORIDE: 107 mmol/L (ref 96–111)
CO2 TOTAL: 24 mmol/L (ref 22–32)
CREATININE: 0.53 mg/dL (ref 0.49–1.10)
ESTIMATED GFR: >60 mL/min/1.73mˆ2 (ref >60)
GLUCOSE: 132 mg/dL (ref 65–139)
GLUCOSE: 132 mg/dL (ref 65–139)
POTASSIUM: 3.7 mmol/L (ref 3.5–5.1)
SODIUM: 137 mmol/L (ref 136–145)

## 2018-07-22 LAB — IRON TRANSFERRIN AND TIBC
IRON (TRANSFERRIN) SATURATION: 8 % — ABNORMAL LOW (ref 16–50)
IRON: 15 ug/dL — ABNORMAL LOW (ref 45–170)
TOTAL IRON BINDING CAPACITY: 188 ug/dL — ABNORMAL LOW (ref 260–400)
TRANSFERRIN: 134 mg/dL — ABNORMAL LOW (ref 180–360)

## 2018-07-22 LAB — CBC
HCT: 23 % — ABNORMAL LOW (ref 34.8–46.0)
HGB: 7.2 g/dL — ABNORMAL LOW (ref 11.5–16.0)
MCH: 28.7 pg (ref 26.0–32.0)
MCHC: 31.3 g/dL (ref 31.0–35.5)
MCV: 91.6 fL (ref 78.0–100.0)
MPV: 9.3 fL (ref 8.7–12.5)
PLATELETS: 417 10*3/uL — ABNORMAL HIGH (ref 150–400)
RBC: 2.51 x10ˆ6/uL — ABNORMAL LOW (ref 3.85–5.22)
RDW-CV: 15.3 % (ref 11.5–15.5)
WBC: 6 x10ˆ3/uL (ref 3.7–11.0)

## 2018-07-22 LAB — BRONCH BRUSH CULTURE & GRAM STAIN
GRAM STAIN: NONE SEEN
STERILE BRUSH CULTURE, QUANTITATIVE: <100 — AB

## 2018-07-22 LAB — IONIZED CALCIUM WITH PH
IONIZED CALCIUM: 1.16 mmol/L (ref 1.10–1.35)
PH (VENOUS): 7.52 (ref 7.31–7.41)

## 2018-07-22 LAB — PHOSPHORUS: PHOSPHORUS: 3.1 mg/dL (ref 2.4–4.7)

## 2018-07-22 LAB — LACTIC ACID LEVEL: LACTIC ACID: 1.1 mmol/L (ref 0.5–2.2)

## 2018-07-22 LAB — FERRITIN: FERRITIN: 495 ng/mL — ABNORMAL HIGH (ref 10–200)

## 2018-07-22 LAB — H & H
HCT: 24.2 % — ABNORMAL LOW (ref 34.8–46.0)
HGB: 7.6 g/dL — ABNORMAL LOW (ref 11.5–16.0)

## 2018-07-22 LAB — MAGNESIUM: MAGNESIUM: 2.1 mg/dL (ref 1.6–2.6)

## 2018-07-22 MED ORDER — GLYCOPYRROLATE 0.2 MG/ML INJECTION SOLUTION
200.00 ug | Freq: Three times a day (TID) | INTRAMUSCULAR | Status: DC | PRN
Start: 2018-07-22 — End: 2018-08-08
  Administered 2018-07-22 – 2018-07-28 (×9): 200 ug via INTRAVENOUS
  Filled 2018-07-22 (×14): qty 1

## 2018-07-22 MED ORDER — BANANA FLAKES-TRANSGALACTOOLIGOSACCHARIDE ORAL POWDER PACKET
1.00 | Freq: Three times a day (TID) | ORAL | Status: DC
Start: 2018-07-22 — End: 2018-07-25
  Administered 2018-07-22 – 2018-07-25 (×9): 8 g via GASTROSTOMY

## 2018-07-22 MED ORDER — NOREPINEPHRINE BITARTRATE 16 MG/250 ML (64 MCG/ML) IN 0.9 % NACL IV
0.0300 ug/kg/min | INTRAVENOUS | Status: DC
Start: 2018-07-22 — End: 2018-07-23
  Administered 2018-07-22: 0 ug/kg/min via INTRAVENOUS
  Administered 2018-07-22: 0.03 ug/kg/min via INTRAVENOUS

## 2018-07-22 MED ORDER — NOREPINEPHRINE BITARTRATE 1 MG/ML INTRAVENOUS SOLUTION
INTRAVENOUS | Status: AC
Start: 2018-07-22 — End: 2018-07-23
  Filled 2018-07-22: qty 16

## 2018-07-22 MED ORDER — ENOXAPARIN 40 MG/0.4 ML SUBCUTANEOUS SYRINGE
40.00 mg | INJECTION | SUBCUTANEOUS | Status: DC
Start: 2018-07-22 — End: 2018-08-08
  Administered 2018-07-22 – 2018-08-07 (×17): 40 mg via SUBCUTANEOUS
  Filled 2018-07-22 (×17): qty 0.4

## 2018-07-22 MED ORDER — SODIUM CHLORIDE 0.9 % INTRAVENOUS SOLUTION
18.00 mg/kg | Freq: Two times a day (BID) | INTRAVENOUS | Status: DC
Start: 2018-07-22 — End: 2018-07-24
  Administered 2018-07-22: 0 mg via INTRAVENOUS
  Administered 2018-07-22 – 2018-07-23 (×2): 1250 mg via INTRAVENOUS
  Administered 2018-07-23 (×2): 0 mg via INTRAVENOUS
  Administered 2018-07-23 – 2018-07-24 (×2): 1250 mg via INTRAVENOUS
  Administered 2018-07-24: 0 mg via INTRAVENOUS
  Filled 2018-07-22 (×5): qty 12.5

## 2018-07-22 MED ADMIN — bacitracin 500 unit/gram topical ointment: TOPICAL | @ 08:00:00

## 2018-07-22 MED ADMIN — norepinephrine bitartrate 16 mg/250 mL (64 mcg/mL) in 0.9 % NaCl IV: INTRAVENOUS | @ 02:00:00

## 2018-07-22 MED ADMIN — sodium chloride 0.9 % (flush) injection syringe: INTRAVENOUS | @ 20:00:00

## 2018-07-22 MED ADMIN — lactated Ringers intravenous solution: GASTROSTOMY | @ 20:00:00

## 2018-07-22 MED ADMIN — nystatin 100,000 unit/gram topical cream: INTRAVENOUS | @ 17:00:00 | NDC 00168005415

## 2018-07-22 MED ADMIN — theophylline ER 400 mg tablet,extended release 24 hr: INTRAVENOUS | @ 19:00:00

## 2018-07-22 MED ADMIN — potassium chloride 20 mEq/L in 0.45 % sodium chloride intravenous soln: GASTROSTOMY | @ 15:00:00 | NDC 00338070434

## 2018-07-22 NOTE — Procedures (Signed)
[facility]  Department of Neurosurgery  Procedure Note    Procedure Date: 07/22/18 Time: 1400  Procedure: Lumbar Puncture  Indication: Diagnostic Tap    Description: After informed consent a surgical time out was performed to confirm correct patient and procedure. Patient was placed in left decubitus position; the L4-L5 lumbar cistern was palpated and identified The site was identified and prepped in the usual sterile fashion. The skin was prepped with chlorhexidine and 1% lidocaine plain analgesia was used. A 20 gauge LP needle was inserted into the lumbar region at the L3-L4 interspace. There was no return of CSF fluid after 3 attempts. 0cc of CSF was collected. Patient tolerated procedure without complications.     Earley Favor, MD  PGY-1 Anesthesiology  [phone]

## 2018-07-22 NOTE — Care Plan (Signed)
[facility]  Physical Therapy Treatment    Patient Name: [name]  Date of Birth: 05/30/1961  Height: Height: 167.6 cm (5' 5.98")  Weight: Weight: 90.3 kg (199 lb 1.2 oz)  Room/Bed: 11/A  Payor: HEALTH PLAN / Plan: HEALTH PLAN (PPO-NON ST EMP) / Product Type: PPO /       Assessment:      (P) [name] continues to be minimally responsive but is maintaining good flexibility all 4 limbs , no soft tissue injury all 4 limbs and no indication for ankle splinting at this time ( ankles actually a little better PROM than last session .    Discharge Needs:    Equipment Recommendation: (P) TBD    Discharge Disposition: (P) TBD      Plan:   Current Intervention:    To provide physical therapy services (P) minimum of 2x/week  for duration of (P) until discharge.    The risks/benefits of therapy have been discussed with the patient/caregiver and he/she is in agreement with the established plan of care.       Past Medical History:   Diagnosis Date   . Anxiety    . Arthropathy    . Cancer (CMS HCC)     brain tumor   . Dyspnea on exertion    . Esophageal reflux     does not take meds   . Heart murmur     benign   . HTN (hypertension)    . Hyperlipemia    . Muscle weakness     right sided weakness   . Palpitations    . Shortness of breath    . Wears glasses     reading         Past Surgical History:   Procedure Laterality Date   . HX APPENDECTOMY     . HX BREAST AUGMENTATION Bilateral    . HX LAP CHOLECYSTECTOMY     . HX LUMBAR DISKECTOMY  1999   . HX OTHER Right     benign tumor removed from upper right leg x3   . HX OTHER      exploratory lap   . HX TUBAL LIGATION            reports that she has been smoking cigarettes. She has a 30.00 pack-year smoking history. She has never used smokeless tobacco. She reports that she does not drink alcohol or use drugs.  Social History     Tobacco Use   Smoking Status Current Every Day Smoker   . Packs/day: 1.00   . Years: 30.00   . Pack years:  30.00   . Types: Cigarettes   Smokeless Tobacco Never Used   Tobacco Comment    down to 8 cigarettes, counseled on 1-800-QUIT_NOW         Subjective & Objective        07/22/18 1320   Therapist Pager   PT Assigned/ Pager # [phone]   Rehab Session   Document Type therapy progress note (daily note)   Total PT Minutes: 18   Patient Effort poor  (essentially unresponsive)   Symptoms Noted During/After Treatment fatigue   General Information   Patient Profile Reviewed? yes   Medical Lines PEG Tube;PIV Line;Telemetry   Respiratory Status aerosol trach collar   Existing Precautions/Restrictions aspiration precautions;fall precautions;full code;other (see comments)  (L craniectomy, no bone flap: helmet for EOB and OOB)   Mutuality/Individual Preferences   Individualized Care Needs PROM in  course of position changes and ADLS   Pre Treatment Status   Pre Treatment Patient Status Patient supine in bed;Call light within reach;Telephone within reach;Sitter select activated;Nurse approved session  (off cooling blanket)   Support Present Pre Treatment  Family present  (a daughter and sister in [employer])   Cognitive Assessment/Interventions   Behavior/Mood Observations unable to arouse   Orientation Status unable/difficult to assess   Attention severe impairment   Follows Commands does not follow one step commands   Comment Decorticate posturing on R, slight withdrawl on L .No eye opening spontaneously or to voice, PROM etc, no visual fixation on a target, no orienting eyes toward voice, no tracking if eyes briefly held passively open   Vital Signs   Vitals Comment VSS and on monitor continually   Pain Assessment   Pre/Post Treatment Pain Comment No behaviors suggest pan   RUE Assessment   RUE ROM PROM WFL   RUE Strength no active movement noted   RUE Tone mildly increased tone   RLE Assessment   RLE ROM PROM WFL   RLE Strength no active movement noted   RLE Tone moderately increased tone   LLE Assessment   LLE ROM PROM WFL   LLE  Strength   (0/5 , no active movement)   Therapeutic Exercise/Activity   Comment PROM x 4 limbs tone reduction techniques to facilitate freer movement, gentle end range stretching,with SLRs bilaterally to close to 60 degrees and DF B ankles to at least 0 deg. Noted increased dorsal edema R hand and to a lesser ext R foot   Post Treatment Status   Post Treatment Patient Status Patient supine in bed;Call light within reach;Sitter select activated   Support Present Post Treatment  Family present   Plan of Care Review   Plan Of Care Reviewed With patient;daughter;family   Basic Mobility Am-PAC/6Clicks Score   Turning in bed without bedrails 1   Lying on back to sitting on edge of flat bed 1   Moving to and from a bed to a chair 1   Standing up from chair 1   Walk in room 1   Climbing 3-5 steps with railing 1   6 Clicks Raw Score total 6   Standardized (t-scale) score 16.59   CMS 0-100% Score 100   CMS Modifier CN   Patient Mobility Goal (JHHLM) 2- Perform PROM 2X/day   Exercise/Activity Level Performed 2- Turned self in bed/ROM (active or passive)   Physical Therapy Clinical Impression   Assessment [name] continues to be minimally responsive but is maintaining good flexibility all 4 limbs , no soft tissue injury all 4 limbs and no indication for ankle splinting at this time ( ankles actually a little better PROM than last session .   Criteria for Skilled Therapeutic meets criteria   Pathology/Pathophysiology Noted neuromuscular;other (see comments)  (vision , somatosensory , cognition)   Impairments Found (describe specific impairments) aerobic capacity/endurance;arousal, attention, and cognition;gait, locomotion, and balance;neuromuscular;ventilation and respiration/gas exchange   Functional Limitations in Following  self-care;home management;work;community/leisure   Disability: Inability to Perform work;community/leisure   Rehab Potential fair, will monitor progress closely   Therapy Frequency minimum of 2x/week      Predicted Duration of Therapy Intervention (days/wks) until discharge   Anticipated Equipment Needs at Discharge (PT) TBD   Anticipated Discharge Disposition TBD       Therapist:   Cherlyn Labella, PT   Pager #: [phone]

## 2018-07-22 NOTE — Care Plan (Signed)
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 GCS 1/5/1. Pt went from requiring cooling blanket to requiring warming blanket. Additionally, levo drip began to tx hypotension. Triple lumen central line was placed. All lumens flush and draw easily. Antibiotic treatment continues and R axillary mass appears slightly smaller. Aggressive pulmonary toileting continued. Patient's secretions have decreased and breath sounds, though still coarse and diminished, aren't as coarse as the previous night. Foley output has been sufficient. Fecal management system shows approx. 100 ml output total since insertion.      Patient Goals Based on Modifiable Risk Factors    The following modifiable risk factors were discussed with patient:     Cigarette Smoking    Goal(s):  Complete Cessation    Patient's selected lifestyle modification(s):  Stop smoking with support of counseling  Stop smoking with support of education on skills to quit  Stop smoking with support of medications and social support  Minimize opportunities when around individuals smoking    Patient/Family/Caregiver response to plan:    Patient unable to respond (1/5/1 and trach).      Problem: Adult Inpatient Plan of Care  Goal: Patient-Specific Goal (Individualization)  Outcome: Ongoing (see interventions/notes)  Flowsheets (Taken 07/22/2018 0357)  Individualized Care Needs: Pt went from requiring cooling blanket to requiring warming blanket (hypo/hyperthermia blanket)  Anxieties, Fears or Concerns: Daughters remain concerned about why pt's temperature fluctuates so much     Problem: Seizure, Active Management  Goal: Absence of Seizure/Seizure-Related Injury  Outcome: Ongoing (see interventions/notes)

## 2018-07-22 NOTE — Care Management Notes (Signed)
[facility]  Care Management Note    Patient Name: [name]  Date of Birth: July 24, 1961  Sex: female  Date/Time of Admission: 07/05/2018  7:26 AM  Room/Bed: 11/A  Payor: HEALTH PLAN / Plan: HEALTH PLAN (PPO-NON ST EMP) / Product Type: PPO /    LOS: 17 days   Primary Care Providers:  Isac Caddy, MD, MD (General)    Admitting Diagnosis:  Meningioma (CMS [facility]) [D32.9]    Assessment:      07/22/18 1348   Assessment Details   Assessment Type Continued Assessment   Date of Care Management Update 07/22/18   Date of Next DCP Update 07/25/18   Care Management Plan   Discharge Planning Status plan in progress   Projected Discharge Date 07/26/18   Discharge Needs Assessment   Discharge Facility/Level of Care Needs SNF Placement (Medicaid only certifiied)(code 64)   Transportation Available ambulance   Patient not medically ready for discharge at this time.  Patient remains in NCCU.  She has increased temp overnight.  Patient having LP done today.    Discharge Plan:  SNF Placement ( Medicaid only certified) (code 77)  Patient will most likely need SNF placement post discharge for therapy needs pending progress and PT/OT recommendations closer to discharge.    The patient will continue to be evaluated for developing discharge needs.     Case Manager: Claudius Sis, [place] COORDINATOR  Phone: [phone]

## 2018-07-22 NOTE — Procedures (Signed)
NAMEGRACEYN, [name]   HOSPITAL NUMBER:  [id number]  DATE OF SERVICE:  07/20/2018  DOB:  01/27/1961  SEX:  F      Date of Study:  July 20, 2018, at 0800, to July 21, 2018, at 0800.     Technician:  AP.  EEG #:  [id number].    REQUESTING PROVIDER:  Theodora Blow, APRN, NP-C.    HISTORY:  A 57 year old female with concern for seizure.    INTERPRETATION:  Abnormal long-term EEG due to:  1. Left hemispheric sharp waves and lateralized periodic discharges, maximally 1.5 Hz, and state dependent, indicating an increase in epileptogenic potential of the underlying cortex.  2. Right hemispheric spikes indicating locally increased epileptogenic potential.  3. Left hemispheric slowing indicating left hemispheric dysfunction.  4. Diffuse slowing indicating nonspecific encephalopathy.     The findings were initially worsened, but overall improved.  Clinical correlation required.    EEG DESCRIPTION:  This is a digitally-acquired long-term video EEG with electrodes placed in accordance with the international 10-20 system.     The posterior dominant rhythm was maximally 7 Hz and seen on the right.  The waking background was continuous but asymmetric.  On the left it consisted of disorganized theta and delta.  On the right it consisted of fairly-organized alpha and theta with some delta.  State change was evident but there were no normal sleep transients.     There were left hemispheric sharp waves and lateralized periodic discharges, which were abundant in wakefulness in brief intermittent periods, at 1 to 1.5 Hz.  There were also right hemispheric spikes which initially occurred singly and occasionally.  As the recording progressed, epileptiform abnormalities initially became nearly continuous on the left and abundant on the right.  By the end of the study, this had improved to abundant intermediate duration periods of 1 to 1.5 Hz left hemispheric sharp waves and lateralized periodic discharges, and overall abundant  right hemispheric spikes.      There were no definite electrographic seizures or clinical spells.        Ilda Mori, MD  Assistant Professor   [facility] Neu[facility]        CC:   Theodora Blow, APRN, NP-C   [address]   [address]   [place], [place] [address]       DD:  07/21/2018 20:22:32  DT:  07/22/2018 06:58:30 NW  D#:  [id number]

## 2018-07-22 NOTE — Progress Notes (Signed)
[facility]  Neurocritical Care (NCCU) Progress Note    [name], [name]  Date of Admission:  07/05/2018  Date of Service: 07/22/2018  Date of Birth:  Nov 19, 1960    Primary Attending: [name], MD   Primary Service: NEUROSURGERY 1 [place]     Chief Complaint: Right sided weakness s/p left tentorial meningioma resection      Subjective: Levo gtt required to maintain MAPs > 65, now off. Febrile overnight. Cultures pending, on broad spectrum antibiotics.     Vital Signs:  Temp (24hrs) Max:39.5 C (846.9 F)      Systolic (62XBM), WUX:324 , Min:82 , MWN:027     Diastolic (25DGU), YQI:34, Min:48, Max:99    Temp  Avg: 38.3 C (101 F)  Min: 36.5 C (97.7 F)  Max: 39.5 C (103.1 F)  MAP (Non-Invasive)  Avg: 73.3 mmHG  Min: 59 mmHG  Max: 107 mmHG  Pulse  Avg: 101.7  Min: 84  Max: 125  Resp  Avg: 27.2  Min: 16  Max: 35  SpO2  Avg: 95.6 %  Min: 87 %  Max: 100 %  Pain Score (Numeric, Faces): 3    Current Medications:  acetaminophen (TYLENOL) 160 mg per 5 mL liquid - grape flavored, 650 mg, Gastric (NG, OG, PEG, GT), Q4H PRN  ascorbic acid (VITAMIN C) tablet, 500 mg, Gastric (NG, OG, PEG, GT), 2x/day  bacitracin 500 units/gram topical ointment tube, , Topical, 2x/day  cefepime (MAXIPIME) 2 g in NS 100 mL IVPB, 2 g, Intravenous, Q8H  chlorhexidine gluconate (PERIDEX) 0.12% mouthwash, 15 mL, Swish & Spit, 2x/day  cloBAZam (ONFI) 2.5 mg per mL oral liquid, 20 mg, Gastric (NG, OG, PEG, GT), Q8HRS  enoxaparin PF (LOVENOX) 40 mg/0.4 mL SubQ injection, 40 mg, Subcutaneous, Q24H  fentaNYL (PF) (SUBLIMAZE) 50 mcg/mL injection ---Cabinet Override, , ,   fentaNYL (SUBLIMAZE) 50 mcg/mL injection, 25 mcg, Intravenous, Q1H PRN  hydrALAZINE (APRESOLINE) injection 10 mg, 10 mg, Intravenous, Q4H PRN  ibuprofen (MOTRIN) tablet, 400 mg, Gastric (NG, OG, PEG, GT), Q6H PRN  labetalol (TRANDATE) 5 mg/mL injection, 10 mg, Intravenous, Q1H PRN  lacosamide (VIMPAT) tablet, 200 mg, Gastric (NG, OG, PEG, GT),  2x/day  lanolin-oxyquin-pet, hydrophil (BAG BALM) topical ointment, , Apply Topically, 2x/day PRN  levETIRAcetam (KEPPRA) tablet, 1,500 mg, Gastric (NG, OG, PEG, GT), 2x/day  nicotine (NICODERM CQ) transdermal patch (mg/24 hr), 14 mg, Transdermal, Daily  norepinephrine (LEVOPHED) 1 mg/mL injection ---Cabinet Override, , ,   norepinephrine (LEVOPHED) 16 mg in NS 283m premix infusion, 0.03 mcg/kg/min, Intravenous, Continuous  NS flush syringe, 2 mL, Intracatheter, Q8HRS    And  NS flush syringe, 2-6 mL, Intracatheter, Q1 MIN PRN  NS flush syringe, 10-40 mL, Intravenous, Q8HRS  NS flush syringe, 20-30 mL, Intravenous, Q1 MIN PRN  nutrition protein supplement 15 g per 30 mL packet, 1 Packet, Gastric (NG, OG, PEG, GT), 2x/day  nystatin (MYCOSTATIN) 100,000 units per mL oral liquid, 5 mL, Swish & Spit, 4x/day  nystatin (NYSTOP) 100,000 units/g topical powder, , Apply Topically, 2x/day  ondansetron (ZOFRAN) 2 mg/mL injection, 4 mg, Intravenous, Q6H PRN  perflutren lipid microspheres (DEFINITY) 1.3 mL in NS 10 mL (tot vol) injection, 2 mL, Intravenous, Give in Cardiology  phenytoin (DILANTIN) 100 mg per 4 mL oral liquid, 100 mg, Gastric (NG, OG, PEG, GT), Q8HRS  potassium bicarbonate-citric acid (EFFER-K) effervescent tablet, 40 mEq, Gastric (NG, OG, PEG, GT), Daily with Breakfast  SSIP insulin lispro (HUMALOG) 100 units/mL injection, 0-12 Units, Subcutaneous, Q4H PRN  vancomycin (VANCOCIN) 1,250 mg in NS 250 mL IVPB, 18 mg/kg (Adjusted), Intravenous, Q24H  Vancomycin IV - Pharmacist to Dose per Protocol, , Does not apply, Daily PRN      Today's Physical Exam:   General: Acutely ill  Neurologic: E1 M5 V1T: does not open eyes, intubated.  PERRL, no facial weakness, + cough.  Motor: LUE localizes, tries to localize RUE but does not cross midline; BLE withdraw. UTA sensation  Cardiovascular: Heart regular rate and rhythm  Lungs: CTAB  Abdomen: soft, non-tender and bowel sounds normal  Extremities: no cyanosis or edema  Skin:  Skin warm and dry    Labs:  I have reviewed all lab results.    I/O:  I/O last 24 hours:      Intake/Output Summary (Last 24 hours) at 07/22/2018 0650  Last data filed at 07/22/2018 0600  Gross per 24 hour   Intake 3823.08 ml   Output 1450 ml   Net 2373.08 ml     I/O current shift:  12/29 1900 - 12/30 0659  In: 1332.08 [I.V.:738.08; IO:035]  Out: 1450 [Urine:1350; Stool:100]    Drips:   Current Facility-Administered Medications   Medication Dose Frequency Last Rate   . norepinephrine (LEVOPHED) 16 mg in NS 281m premix infusion  0.03 mcg/kg/min Continuous Stopped (07/22/18 0500)         Radiology Results:    CT brain 07/11/18: "1.  Stable postoperative changes from left decompressive craniectomy with persistent extracalvarial brain herniation.  2.  Persistent cerebral edema and hemorrhagic tumor resection site. No new  Hemorrhage."    MRI brain w/wo contrast 07/06/18: "acute postoperative changes related to gross total resection of a left medial tentorial meningioma with previously seen mass effect on the left thalamus, left middle cerebral peduncle and left midbrain. The current examination demonstrate hemorrhagic products are identified within the resection cavity extending into the parenchyma of the left thalamus and tracking down within the left midbrain and extending to the fourth Ventricle."        Impression and Plan:    Active Hospital Problems    Diagnosis   . 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 Tobacco use disorder   . Hypokalemia       Neurologic:  L Tentorial meningioma s/p craniotomy for tumor resection, s/p L fronto-temporal craniectomy with evacuation for tumor bed hemorrhage  -- Q2H neuro checks   -- MRI brain w/wo contrast 07/20/18: Post-op changes from meningioma resection, possible subocclusive thrombus in left transverse sinus  -- STAT CT brain without contrast for any acute change in neurological status   -- Started  amantadine 12/23, - D/c'd 12/24 d/t increased seizures    Multiple generalized seizures on vEEG  -- vEEG; no seizures overnight  -- Current AEDs   -- Keppra 1500 BID   -- Vimpat 200 BID   -- Phenytoin 100 TID   -- Onfi 243mTID  -- May discontinue vEEG today, will f/u with EMU    Pain/sedation  -- Tylenol PRN    Left transverse sinus thrombus  -- CT venogram 07/21/18: Confirmation of subocclusive left transverse sinus thrombus  -- Will f/u with NSGY regarding anticoagulation      Cardiovascular:    Hypotension, may be 2/2 shock  -- Levophed gtt now OFF  -- Received 1.5 L fluid overnight, 1 L fluid 12/29 evening  -- MAP > 65    HTN, chronic  -- SBP goal < 160  --  Hydralazine/labetalol PRN  -- Current antihypertensives: none      Respiratory:    Acute respiratory failure requiring mechanical ventilation s/p tracheostomy   -- ATC  -- CXR 07/22/18: Appears stable, IJ in place  -- Recommenced CT Chest as outpatient      Renal:   Hyponatremia, improved  -- Daily BMP    K goal > 4, Mg goal > 2  -- Continue daily K replacement    Hypocalcemia  -- Ionized WNL      Infectious Disease:   Leukocytosis, may be 2/2 right axilla abscess vs PNA vs meningitis  -- Cultures:    -- Bronch brush 07/21/18: NGTD   -- UA 07/21/18: Clear   -- BC 07/21/18: Pending   -- Blood cultures 12/26: ngtd   -- BAL 12/25: ngtd   -- Urine Cx 12/23 - Candida albicans, replaced foley cath   -- Urine Cx 12/24: Candida Albicans, foley removed   -- Bactrim started 07/20/18, x10 days, end 07/29/2018  -- Ultrasound 07/20/18 of R axilla completed, will f/u read  -- LP to be completed per NSGY  -- Continue cooling blankets  -- Ibuprofen and tylenol for fever  -- Lactic acid pending      Hematology:  Normocytic anemia   -- 1u pRBCs 12/23 for hbg 6.8  -- Continue to monitor and transfuse if Hgb < 7     DVT prophylaxis  -- SCD's / lovenox     LLE DVT  -- Peripheral Venous Duplex 07/16/18: LLE DVT (calf vein)  -- Duplexes upper and lower extremities ordered for  07/23/18      Gastrointestinal:   Diarrhea  -- C. Diff 12/23 - neg  -- FMS in place, will continue  -- Banana flakes ordered    Nutrition  -- Osmolite 1.5, 1 can Q4H while awake  -- Vit C  -- Protein packet      Endocrine:  No acute issues  -- SSIP conservative      Integumentary:   Right axilla abscess  -- See ID    Excoriation in inner thigh area  -- Bag balm   -- Nystatin powder BID      Psychiatric:  No acute issues  -- Monitor for ICU delirium       Prophylaxis:    DVT/PE Prophylaxis: SCDs, lovenox  GI: NA  Mouth: Peridex      Family:  None present at bedside  Disposition/ Baseline:  ICU  Advance Directives:  None-Discussed  Hospice involvement prior to admission?  no    Code Status Information     Code Status    Full Code          Porfirio Mylar, 07/22/2018, 09:58  Neurocritical Care   Department of Neurology  Pager [phone]    I personally saw and evaluated the patient. See mid-level's note for additional details. My findings/participation are   Patient remains poorly responsive  Not following  Trach in place  Right axilla full and warm, but improved  Unclear cause of fever/ hypotension  EEG stable to improved  Otherwise Plan as Above in Resident or Midlevel's Note.     Valora Corporal, MD

## 2018-07-22 NOTE — Nurses Notes (Addendum)
Patient was positioned on left side for quite some time in order for LP to be completed.  After LP, patient's temperature increased to 38.7 C.  BP was also elevated (see flowsheets). Cooling blanket and ice packs applied.  Prn tylenol and labetolol administered per orders.  Service notified because there was no change in temperature.  Per service okay to give ibuprofen at 1600.

## 2018-07-22 NOTE — Nurses Notes (Signed)
Patient's temperature has started to decrease.  Will wait to administer ibuprofen and monitor temperature.

## 2018-07-22 NOTE — Progress Notes (Signed)
[facility]  NEUROSURGERY   PROGRESS NOTE    [name], [name], 57 y.o. female  Date of Admission:  07/05/2018  Date of Service: 07/22/2018  Date of Birth:  09/13/1960     Chief Complaint: right upper and lower extremity weakness    Subjective: Failed bedside LP yesterday.  Plan for IR LP today hopefully.  Remains febrile intermittently.    Vital Signs:  Temp (24hrs) Max:39.5 C (144.3 F)      Systolic (15QMG), QQP:619 , Min:82 , JKD:326     Diastolic (71IWP), YKD:98, Min:48, Max:99    Temp  Avg: 38.3 C (101 F)  Min: 36.5 C (97.7 F)  Max: 39.5 C (103.1 F)  MAP (Non-Invasive)  Avg: 73.2 mmHG  Min: 59 mmHG  Max: 107 mmHG  Pulse  Avg: 101.6  Min: 84  Max: 125  Resp  Avg: 27.2  Min: 16  Max: 35  SpO2  Avg: 95.5 %  Min: 87 %  Max: 100 %  Pain Score (Numeric, Faces): 3    Min/Max/Avg ICP/CPP last 24hrs:   No data recorded    Today's Physical Exam:    Appears acutely ill, intubated  GCS _0 T  -- Eyes do not open to noxious stimuli  -- Localizes in LUE, Withdraws in RUE and BLE  PERRL  Face symmetric  +cough    Unable to assess hearing  Unable to assess speech  Unable to assess fund of knowledge  Unable to assess attention span & concentration  Unable to assess recent and remote memory  CN _1 EOMI Unable to assess  CN 11 shrug symmetric Unable to assess  Muscle Strength Unable to assess  Unable to assess drift    -- L fronto-temporal incision c/d/i, staples removed  -- JP drain Rapide  -- LD Rapide    Assessment/Plan:  [name] is a 57 yo F PMH HTN, HLD w/ 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 POD18/18.  -- FU video EEG   -- Keppra 1500 mg 2x/day   -- Vimpat 200 mg 2x/day   -- Dilantin 100 mg q8h   -- Clobazam 20 mg q8h   -- Will need outpatient neurology follow-up  -- Please bathe patient  daily and clean wound daily  -- Okay for soap/shampoo  -- Apply bacitracin ointment BID, cleaning old ointment off before application of new ointment  -- Final pathology: WHO grade I meningioma  -- FU blood pressure   -- Previously started Carvedilol 12.5 mg 2x/day and Lisinopril 40 mg daily held  -- FU IR LP    -- Imaging:   -- Venous duplex LLE (07/23/18): ORDERED   -- CTV intra w/wo (07/20/18): sub-occlusive left transverse sinus thrombus   -- US soft tissue axilla (07/20/18): nonspecific area of increased echogenicity seen at the site of concern in the right axilla measuring approximately 6.5 x 2.0 x 8.9 cm; appears nonmass-like with hazy borders and is likely related to cellulitis   -- MRI-brain w/wo (07/20/18): possible non-occlusive thrombus in transverse sinus   -- CT-brain wo (07/19/18): interval decrease of hematoma   -- Venous duplex BLE (07/16/18): left peroneal (calf vein) DVT   -- Venous duplex BUE (07/16/18): no evidence of DVT   -- TTE (07/12/18): EF 75%, no regional wall abnormalities   -- CT brain w/o (07/11/18):  stable    -- CT brain w/o (07/09/18 @ 1328):stable   -- CT brain w/o (07/09/18 @ 0430):stable   -- CT brain w/o (07/08/18):postop changes from decompressive craniectomy with interval mild extracalvarial brain herniation through the craniectomy site; mild increase in cerebral edema at the tumor resection bed and adjacent parenchyma without significant increase in hemorrhage   -- MRI brain w/wo (07/06/18):gross total resection of a left medial tentorial meningioma; hemorrhagic productswithin the resection cavity extending into the parenchyma of the left thalamus and tracking down within the left midbrain and extending to the fourth ventricle   -- CT brain w/o (07/06/18): postop changes from left decompressive craniectomy for evacuation of hematoma of tumor bed with residual hemorrhage and improved edema and rightward midline shift  -- CT brain w/o  (07/06/18): postop changes from decompressive craniectomy for evacuation of tumor bed hematoma with minimal residual blood at the surgical site   -- CT brain w/o (07/05/18): postop changes with development of hematoma at tumor bed and 8 mm of midline shift  -- Pain/spasm control: Tylenol PRN, Fentanyl PRN, Motrin PRN  -- Diet: NPO + Osmolite TF  -- Bowel regimen held, FMS in place  -- Abx: C/V empirically for septic shock, started 07/21/18   -- Bronch Brush (07/21/18): 1+ PMN, NGTD   -- Blood Cx (07/21/18): in process   -- UA (07/21/18): WNL   -- BAL (07/17/18): 15000 Anginosus group Viridans Streptococcus   -- CDiff (07/15/18): negative   -- Urine Cx (07/16/18): C. albicans  -- Activity: Up in chair with assist TID   -- Craniectomy precautions, helmet to be worn at all times when OOB  -- PT/OT: SNF vs LTAC  -- DVT ppx: Lovenox, SCDs/Venodynes  -- Consults:    NCCU/Neurology   -- Will need outpatient neurology follow-up for AED management   Trauma   -- Trach/PEG completed 07/15/18  -- Lines/Drains: Trach/PEG, FMS, R IJ, Foley catheter, cooling blanket  -- Wound: L fronto-temporal incision (staples removed), JP drain Rapide, LD Rapide, helmet  -- Disposition: ongoing    Reola Calkins, MD, PhD  Department of Neurosurgery  PGY5      Late entry for 07/23/2018. I saw and examined the patient.  I reviewed the resident's note.  I agree with the findings and plan of care as documented in the resident's note.  Any exceptions/additions are edited/noted.    Vincente Poli, MD

## 2018-07-22 NOTE — Procedures (Signed)
[name], SCALZO   HOSPITAL NUMBER:  [id number]  DATE OF SERVICE:  07/19/2018  DOB:  [birth year]  SEX:  F      Date of study:  July 19, 2018.   Technician:  AP.   EEG #:  [id number].    REQUESTING PROVIDER:  Theodora Blow, APRN, NP-C.    DATE AND TIME:  July 19, 2018, at 1500 hours until July 20, 2018, at 0800 hours.    HISTORY:  The patient is a 57 year old female with seizures.    INTERPRETATION:  Abnormal long-term EEG due to:   1. GPDs and left hemispheric LPDs, state dependent, indicating highly increased epileptogenic potential in the left hemisphere.  These improved with medications but remained very prevalent.  2. Left hemispheric slowing indicating left hemispheric dysfunction.  3. Diffuse slowing indicating nonspecific encephalopathy.     Findings are relatively improved.  Clinical correlation required.    DESCRIPTION:  This is a digitally acquired long-term video EEG with electrodes placed in accordance with the international 10-20 system.     The posterior dominant rhythm was maximally 7 Hz and seen on the right.  The waking background was continuous but asymmetric.  On the left it consisted of disorganized theta and delta.  On the right it consisted of fairly organized alpha and theta with some delta.  There was evidence of state change with no normal sleep transients.     There were left hemispheric lateralized periodic discharges at times with a generalized distribution.  Initially, there were abundant brief intervals of 1-1.5 Hz discharges in wakefulness.  These increased to continuous 1.5-2 Hz discharges.  At 2352 hours, lorazepam was administered and these decreased in prevalence.  By the end of the recording, there were there were abundant brief to intermediate duration periods of 1-1.5 Hz discharges in wakefulness.  Throughout the study, these were much less prevalent in sleep.  There were no definite electrographic seizures or clinical spells.        Ilda Mori, MD  Assistant  Professor    Department of Pediatrics and Neurology        CC:   Theodora Blow, APRN, NP-C   [address]   [address]   [place], [place] [address]       DD:  07/20/2018 20:29:01  DT:  07/20/2018 21:09:12 FP  D#:  [id number]

## 2018-07-23 ENCOUNTER — Inpatient Hospital Stay (HOSPITAL_COMMUNITY): Payer: PPO

## 2018-07-23 DIAGNOSIS — D320 Benign neoplasm of cerebral meninges: Secondary | ICD-10-CM

## 2018-07-23 DIAGNOSIS — I82552 Chronic embolism and thrombosis of left peroneal vein: Secondary | ICD-10-CM

## 2018-07-23 DIAGNOSIS — I82613 Acute embolism and thrombosis of superficial veins of upper extremity, bilateral: Secondary | ICD-10-CM

## 2018-07-23 LAB — CBC
HCT: 25.4 % — ABNORMAL LOW (ref 34.8–46.0)
HGB: 8 g/dL — ABNORMAL LOW (ref 11.5–16.0)
MCH: 28.5 pg (ref 26.0–32.0)
MCHC: 31.5 g/dL (ref 31.0–35.5)
MCV: 90.4 fL (ref 78.0–100.0)
MPV: 9.2 fL (ref 8.7–12.5)
PLATELETS: 503 10*3/uL — ABNORMAL HIGH (ref 150–400)
RBC: 2.81 10*6/uL — ABNORMAL LOW (ref 3.85–5.22)
RDW-CV: 15.1 % (ref 11.5–15.5)
WBC: 8.1 10*3/uL (ref 3.7–11.0)

## 2018-07-23 LAB — BASIC METABOLIC PANEL
ANION GAP: 8 mmol/L (ref 4–13)
BUN/CREA RATIO: 31 — ABNORMAL HIGH (ref 6–22)
BUN: 15 mg/dL (ref 8–25)
CALCIUM: 8.7 mg/dL (ref 8.5–10.2)
CHLORIDE: 105 mmol/L (ref 96–111)
CO2 TOTAL: 24 mmol/L (ref 22–32)
CREATININE: 0.49 mg/dL (ref 0.49–1.10)
ESTIMATED GFR: >60 mL/min/{1.73_m2} (ref >60)
GLUCOSE: 91 mg/dL (ref 65–139)
POTASSIUM: 4.5 mmol/L (ref 3.5–5.1)
SODIUM: 137 mmol/L (ref 136–145)

## 2018-07-23 LAB — BODY FLUID CSF MAN DIFF
LYMPHOCYTE %: 74 %
MONOCYTE/MACROPHAGE %: 20 %
NEUTROPHIL %: 6 %

## 2018-07-23 LAB — GLUCOSE CSF: GLUCOSE CSF: 65 mg/dL (ref 50–80)

## 2018-07-23 LAB — PHOSPHORUS: PHOSPHORUS: 3.4 mg/dL (ref 2.4–4.7)

## 2018-07-23 LAB — POC BLOOD GLUCOSE (RESULTS)
GLUCOSE, POC: 108 mg/dL — ABNORMAL HIGH (ref 70–105)
GLUCOSE, POC: 98 mg/dL (ref 70–105)

## 2018-07-23 LAB — BODY FLUID CELL COUNT WITH DIFFERENTIAL
NUCLEATED CELLS, FLUID: 5 /uL (ref 0–5)
RBC COUNT: 130 /uL

## 2018-07-23 LAB — ADULT ROUTINE BLOOD CULTURE, SET OF 2 BOTTLES (BACTERIA AND YEAST)
BLOOD CULTURE, ROUTINE: NO GROWTH
BLOOD CULTURE, ROUTINE: NO GROWTH
BLOOD CULTURE, ROUTINE: NO GROWTH

## 2018-07-23 LAB — PROTEIN CSF: PROTEIN CSF: 31 mg/dL (ref 15–45)

## 2018-07-23 LAB — MAGNESIUM: MAGNESIUM: 2 mg/dL (ref 1.6–2.6)

## 2018-07-23 MED ORDER — NUTRITION PROTEIN SUPPLEMENT - TUBE FEED
1.0000 | Freq: Every day | Status: DC
Start: 2018-07-24 — End: 2018-08-07
  Administered 2018-07-24 – 2018-07-28 (×5): 15 g via GASTROSTOMY
  Administered 2018-07-29: 0 g via GASTROSTOMY
  Administered 2018-07-30 – 2018-08-07 (×9): 15 g via GASTROSTOMY

## 2018-07-23 MED ORDER — LIDOCAINE (PF) 10 MG/ML (1 %) INJECTION SOLUTION
INTRAMUSCULAR | Status: AC
Start: 2018-07-23 — End: 2018-07-23
  Filled 2018-07-23: qty 5

## 2018-07-23 MED ADMIN — PEDS CUSTOM PARENTERAL NUTRITION - CHI: GASTROSTOMY | @ 13:00:00

## 2018-07-23 MED ADMIN — banana flakes-transgalactooligosaccharide oral powder packet: GASTROSTOMY | @ 20:00:00

## 2018-07-23 MED ADMIN — Medication: INTRAVENOUS | @ 15:00:00

## 2018-07-23 MED ADMIN — sodium chloride 0.9 % (flush) injection syringe: GASTROSTOMY | @ 08:00:00

## 2018-07-23 MED ADMIN — sodium chloride 0.9 % (flush) injection syringe: INTRAVENOUS | @ 20:00:00

## 2018-07-23 MED ADMIN — nystatin 100,000 unit/gram topical powder: TOPICAL | @ 20:00:00

## 2018-07-23 MED ADMIN — bacitracin 500 unit/gram topical ointment: TOPICAL | @ 08:00:00

## 2018-07-23 MED ADMIN — lactated Ringers intravenous solution: TRANSDERMAL | @ 08:00:00 | NDC 00338011704

## 2018-07-23 MED ADMIN — ADULT CLINMIX CUSTOM PARENTERAL NUTRITION - ~~LOC~~: INTRAVENOUS | @ 09:00:00

## 2018-07-23 NOTE — Nurses Notes (Addendum)
Patient taken to IR at this time via transport and bedside RN.  Tube feeding stopped at this time.

## 2018-07-23 NOTE — Care Plan (Addendum)
[place]  Occupational Therapy Progress Note    Patient Name: [name]  Date of Birth: 1961-05-16  Height:  167.6 cm (5' 5.98")  Weight:  95.8 kg (211 lb 3.2 oz)  Room/Bed: 11/A  Payor: HEALTH PLAN / Plan: HEALTH PLAN (PPO-NON ST EMP) / Product Type: PPO /     Assessment:    Pt tolerated PROM to all extremities. No response to stimuli during session. Recommend LTACH vs SNF.     Discharge Needs:   Equipment Recommendation: to be determined    Discharge Disposition:  skilled nursing facility, long term acute care facility     JUSTIFICATION OF DISCHARGE RECOMMENDATION   Based on current diagnosis, functional performance prior to admission, and current functional performance, this patient requires continued OT services in skilled nursing facility, long term acute care facility in order to achieve significant functional improvements.    Plan:   Continue to follow patient according to established plan of care.  The risks/benefits of therapy have been discussed with the patient/caregiver and he/she is in agreement with the established plan of care.     Subjective & Objective:        07/22/18 1321   Therapist Pager   OT Assigned/ Pager # allison [phone]   Rehab Session   Document Type therapy progress note (daily note)   Total OT Minutes: 18   Patient Effort poor   Symptoms Noted During/After Treatment fatigue   General Information   Medical Lines PEG Tube;PIV Line;Telemetry   Respiratory Status aerosol trach collar   Existing Precautions/Restrictions aspiration precautions;fall precautions;full code  (helmet on for OOB)   Pre Treatment Status   Pre Treatment Patient Status Patient supine in bed;Nurse approved session   Support Present Pre Treatment  Family present  (daughter and sister-in-law)   Communication Pre Treatment  Nurse   Vital Signs   Vitals Comment VSS   Pain Assessment   Pre/Post Treatment Pain Comment no indications of pain    Coping/Psychosocial Response  Interventions   Plan Of Care Reviewed With patient;daughter;family   Cognitive Assessment/Interventions   Behavior/Mood Observations unable to arouse   Orientation Status unable/difficult to assess   Attention severe impairment   Follows Commands does not follow one step commands   Comment not responding to voice or PROM   Therapeutic Exercise/Activity   Comment PROM performed to all extremities; resisted some in LUE at times. Hand over hand to dependently to wash face with RUE   Post Treatment Status   Post Treatment Patient Status Patient supine in bed   Support Present Post Treatment  Family present   Communication Post Treatement Nurse   Occupational Therapy Clinical Impression   Functional Level at Time of Session Pt tolerated PROM to all extremities. No response to stimuli during session. Recommend LTACH vs SNF.    Anticipated Equipment Needs at Discharge to be determined   Anticipated Discharge Disposition skilled nursing facility;long term acute care facility       Therapist:   Meyer Russel, OT  Pager #: [phone]

## 2018-07-23 NOTE — Consults (Signed)
Interval Consult Note:    Trach plate sutures removed today.  Pt's trach site skin non-erythematous.  Tracheal stay sutures remain and will be removed in 1 week.    Call or page Trauma Service if questions    Juleen China, MD  07/23/2018, 18:11

## 2018-07-23 NOTE — Nurses Notes (Signed)
Patient passed a very small clot in her urine.  Service notified.  No orders obtained at this time.  Will continue to monitor.

## 2018-07-23 NOTE — Procedures (Signed)
NAMEBRYTON, WAIGHT   HOSPITAL NUMBER:  [id number]  DATE OF SERVICE:  07/21/2018  DOB:  Jan 15, 1961  SEX:  F      Technician:  AP.  EEG #:  [phone].  Date and time:  July 21, 2018, 0800 to July 22, 2018, at 0800.    REQUESTING PROVIDER:  Theodora Blow, APRN, NP-C.    HISTORY:  A 57 year old female with seizures and epileptiform discharges.    INTERPRETATION:  Abnormal long-term EEG due to:  1. Abundant independent left hemispheric sharp waves, frequently lateralized periodic discharges at 1.5 Hz, indicating highly increased epileptogenic potential of the underlying cortex.  2. State dependent right hemispheric sharp waves indicating locally increased epileptogenic potential.  3. Left hemispheric slowing indicating left hemispheric dysfunction.  4. Diffuse slowing indicating nonspecific encephalopathy.     Findings are slightly improved.  Clinical correlation required.    ELECTROENCEPHALOGRAM DESCRIPTION:  This is a digitally acquired long-term video EEG with electrodes placed in accordance with the international 10-20 system.     The posterior dominant rhythm was maximally 7 Hz and seen only on the right.  The waking background was continuous but asymmetric.  On the left, it consisted of disorganized theta and delta.  On the right, it consisted of fairly organized alpha and theta with some delta.  State change was evident but there were no normal sleep transients.     There were left hemispheric sharp waves.  In wakefulness these were abundant and frequently formed intermediate duration runs of 1 to 1.5 Hz lateralized periodic discharges without distinct periods of evolution.  In sleep, they were frequent and single.  There were right hemispheric spikes abundantly occurring singly.  There were no electrographic seizures or clinical spells.        Ilda Mori, MD  Assistant Professor   [facility] Department of Pediatrics and Neurology        CC:   Theodora Blow, APRN, NP-C   [address]   [address]     [place], [place] [address]       DD:  07/22/2018 22:12:48  DT:  07/23/2018 07:35:45 TP  D#:  [id number]

## 2018-07-23 NOTE — Care Plan (Signed)
[facility]  Medical Nutrition Therapy Follow Up    SUBJECTIVE: Pt now on ATC. Pt did not wake to calling of her name at two attempted visits. Spoke with pt's nurse who stated TF is going well. Pt missed one can of TF today due to vomiting in IR, but has since received a can of TF and tolerated it well. Weight up 39# in past 2 weeks    OBJECTIVE:     Current Diet Order/Nutrition Support:  MNT PROTOCOL FOR DIETITIAN  ADULT TUBE FEED - BOLUS Q4H WHILE AWAKE NO MEALS, TF ONLY; OSMOLITE 1.5; NG; BOLUS; Bolus Amount: 1 can 5 times daily  DIET NPO - NOW EXCEPT TUBE FEEDS     Height Used for Calculations: 167.6 cm (5' 5.98")  Weight Used For Calculations: 78.2 kg (172 lb 6.4 oz)(standing weight upon admission)  BMI (kg/m2): 27.9  BMI Assessment: BMI 25-29.9: overweight  Ideal Body Weight (IBW) (kg): 59.54  % Ideal Body Weight: 131.34  Adjusted/Standard Body Weight  Adjusted BW: 64 kg          Re-assessed needs if applicable:    Energy Calorie Requirements: 1900-2100(30-33 kcal/64 kg Adj BW) per day   Protein Requirements (gms/day): 78-90(1.3-1.5 gm/60 kg IBW) per day   Fluid Requirements: 1900-2100(1 ml/kcal) per day       Plan/Interventions :   1. Continue tube feeds with Osmolite 1.5, bolus 1 can 5 times daily and decrease prosource to 1 packet per day to provide:  1835cal = 29cals/kg  90g protein = 1.5g/kg  981ml free water + 200 mL Q6H = 1705 mL    2. Monitor I/O's for tube feed tolerance, provide free water flushes PRN  3. Monitor daily weight trends - weight up 39# in past 2 weeks - I/O +16 L  4. Continue banana flakes until diarrhea improves  5. Continue 500 mg Vitamin C BID as medically appropriate  6. Advance diet per SLP rec's as medically appropriate    Nutrition Diagnosis: Inability to swallowrelated to Patient on ventas evidenced by Need for TF (in progress)      Alba Destine, RDLD  07/23/2018, 13:54  Pager # [phone]      Problem: Adult Inpatient Plan of  Care  Goal: Plan of Care Review  Outcome: Ongoing (see interventions/notes)     Problem: Feeding Intolerance (Enteral Nutrition)  Goal: Feeding Tolerance  Outcome: Ongoing (see interventions/notes)  Intervention: Prevent and Manage Feeding Intolerance  Flowsheets (Taken 07/23/2018 1353)  Nutrition Support Management: enteral feeding continued

## 2018-07-23 NOTE — Nurses Notes (Signed)
Patient returned to NCCU 11 at this time.    While in IR, patient was positioned prone.  At some point, the patient had an episode of emesis which was found when the patient was being transferred from the exam table to the bed.  There was a moderate amount and tan in color.  Patient was suctioned orally and nasally.  Oxygen saturations were 100% the entire time.  No evidence of emesis in the patient's trach.  Service was notified upon return to unit. No new orders obtained. Will continue to monitor.

## 2018-07-23 NOTE — Care Plan (Signed)
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 GCS 1/5/1. Pt went from requiring cooling blanket to requiring warming blanket. Additionally, levo drip began to tx hypotension. Triple lumen central line was placed. All lumens flush and draw easily. Antibiotic treatment continues and R axillary mass appears slightly smaller. Aggressive pulmonary toileting continued. Patient's secretions have decreased and breath sounds, though still coarse and diminished, aren't as coarse as the previous night. Foley output has been sufficient. Fecal management system shows approx. 100 ml output total since insertion.        Patient Goals Based on Modifiable Risk Factors     The following modifiable risk factors were discussed with patient:      Cigarette Smoking    Goal(s):  Complete Cessation     Patient's selected lifestyle modification(s):  Stop smoking with support of counseling  Stop smoking with support of education on skills to quit  Stop smoking with support of medications and social support  Minimize opportunities when around individuals smoking     Patient/Family/Caregiver response to plan:     Patient unable to respond (1/5/1 and trach).        Problem: Adult Inpatient Plan of Care  Goal: Patient-Specific Goal (Individualization)  Outcome: Ongoing (see interventions/notes)  Flowsheets (Taken 07/22/2018 0357)  Individualized Care Needs: Pt went from requiring cooling blanket to requiring warming blanket (hypo/hyperthermia blanket)  Anxieties, Fears or Concerns: Daughters remain concerned about why pt's temperature fluctuates so much     Problem: Seizure, Active Management  Goal: Absence of Seizure/Seizure-Related Injury  Outcome: Ongoing (see interventions/notes)

## 2018-07-23 NOTE — Progress Notes (Signed)
[facility]  Neurocritical Care (NCCU) Progress Note    [name], [name]  Date of Admission:  07/05/2018  Date of Service: 07/23/2018  Date of Birth:  1960-12-10    Primary Attending: [name], MD   Primary Service: NEUROSURGERY 1 [place]     Chief Complaint: Right sided weakness s/p left tentorial meningioma resection      Subjective: Continues to have fevers off and on over past 24 hrs. Cultures pending, on broad spectrum antibiotics.     Vital Signs:  Temp (24hrs) Max:39.3 C (242.3 F)      Systolic (53IRW), ERX:540 , Min:103 , GQQ:761     Diastolic (95KDT), OIZ:12, Min:56, Max:115    Temp  Avg: 37.7 C (99.9 F)  Min: 37.1 C (98.8 F)  Max: 39.3 C (102.7 F)  MAP (Non-Invasive)  Avg: 89 mmHG  Min: 70 mmHG  Max: 126 mmHG  Pulse  Avg: 104.6  Min: 97  Max: 113  Resp  Avg: 26.3  Min: 14  Max: 32  SpO2  Avg: 96.9 %  Min: 92 %  Max: 100 %  Pain Score (Numeric, Faces): 3    Current Medications:  acetaminophen (TYLENOL) 160 mg per 5 mL liquid - grape flavored, 650 mg, Gastric (NG, OG, PEG, GT), Q4H PRN  ascorbic acid (VITAMIN C) tablet, 500 mg, Gastric (NG, OG, PEG, GT), 2x/day  bacitracin 500 units/gram topical ointment tube, , Topical, 2x/day  banana flakes (BANATROL PLUS) packet, 1 Packet, Gastric (NG, OG, PEG, GT), Q8HRS  cefepime (MAXIPIME) 2 g in NS 100 mL IVPB, 2 g, Intravenous, Q8H  chlorhexidine gluconate (PERIDEX) 0.12% mouthwash, 15 mL, Swish & Spit, 2x/day  cloBAZam (ONFI) 2.5 mg per mL oral liquid, 20 mg, Gastric (NG, OG, PEG, GT), Q8HRS  enoxaparin PF (LOVENOX) 40 mg/0.4 mL SubQ injection, 40 mg, Subcutaneous, Q24H  fentaNYL (SUBLIMAZE) 50 mcg/mL injection, 25 mcg, Intravenous, Q1H PRN  glycopyrrolate (ROBINUL) 0.2 mg/mL (200 mcg/mL) injection, 200 mcg, Intravenous, Q8H PRN  hydrALAZINE (APRESOLINE) injection 10 mg, 10 mg, Intravenous, Q4H PRN  ibuprofen (MOTRIN) tablet, 400 mg, Gastric (NG, OG, PEG, GT), Q6H PRN  labetalol (TRANDATE) 5 mg/mL injection, 10 mg, Intravenous, Q1H  PRN  lacosamide (VIMPAT) tablet, 200 mg, Gastric (NG, OG, PEG, GT), 2x/day  lanolin-oxyquin-pet, hydrophil (BAG BALM) topical ointment, , Apply Topically, 2x/day PRN  levETIRAcetam (KEPPRA) tablet, 1,500 mg, Gastric (NG, OG, PEG, GT), 2x/day  nicotine (NICODERM CQ) transdermal patch (mg/24 hr), 14 mg, Transdermal, Daily  norepinephrine (LEVOPHED) 16 mg in NS 260m premix infusion, 0.03 mcg/kg/min, Intravenous, Continuous  NS flush syringe, 2 mL, Intracatheter, Q8HRS    And  NS flush syringe, 2-6 mL, Intracatheter, Q1 MIN PRN  NS flush syringe, 10-40 mL, Intravenous, Q8HRS  NS flush syringe, 20-30 mL, Intravenous, Q1 MIN PRN  nutrition protein supplement 15 g per 30 mL packet, 1 Packet, Gastric (NG, OG, PEG, GT), 2x/day  nystatin (MYCOSTATIN) 100,000 units per mL oral liquid, 5 mL, Swish & Spit, 4x/day  nystatin (NYSTOP) 100,000 units/g topical powder, , Apply Topically, 2x/day  ondansetron (ZOFRAN) 2 mg/mL injection, 4 mg, Intravenous, Q6H PRN  perflutren lipid microspheres (DEFINITY) 1.3 mL in NS 10 mL (tot vol) injection, 2 mL, Intravenous, Give in Cardiology  phenytoin (DILANTIN) 100 mg per 4 mL oral liquid, 100 mg, Gastric (NG, OG, PEG, GT), Q8HRS  potassium bicarbonate-citric acid (EFFER-K) effervescent tablet, 40 mEq, Gastric (NG, OG, PEG, GT), Daily with Breakfast  SSIP insulin lispro (HUMALOG) 100 units/mL injection, 0-12 Units, Subcutaneous,  Q4H PRN  vancomycin (VANCOCIN) 1,250 mg in NS 250 mL IVPB, 18 mg/kg (Adjusted), Intravenous, Q12H  Vancomycin IV - Pharmacist to Dose per Protocol, , Does not apply, Daily PRN      Today's Physical Exam:   General: Acutely ill  Neurologic: E1 M5 V1T: does not open eyes, intubated.  R pupil 47m/L 232m no facial weakness, + cough.  Motor: LUE localizes, tries to localize RUE but does not cross midline; BLE withdraw. UTA sensation  Cardiovascular: Heart regular rate and rhythm  Lungs: CTAB  Abdomen: soft, non-tender and bowel sounds normal  Extremities: no cyanosis or  edema  Skin: Skin warm and dry    Labs:  I have reviewed all lab results.    I/O:  I/O last 24 hours:      Intake/Output Summary (Last 24 hours) at 07/23/2018 0649  Last data filed at 07/23/2018 0500  Gross per 24 hour   Intake 2101 ml   Output 2725 ml   Net -624 ml     I/O current shift:  12/30 1900 - 12/31 0659  In: 970 [I.V.:100; GT:870]  Out: 1430 [Urine:1430]    Drips:   Current Facility-Administered Medications   Medication Dose Frequency Last Rate   . norepinephrine (LEVOPHED) 16 mg in NS 25093mremix infusion  0.03 mcg/kg/min Continuous Stopped (07/22/18 0500)         Radiology Results:    CT brain 07/11/18: "1.  Stable postoperative changes from left decompressive craniectomy with persistent extracalvarial brain herniation.  2.  Persistent cerebral edema and hemorrhagic tumor resection site. No new  Hemorrhage."    MRI brain w/wo contrast 07/06/18: "acute postoperative changes related to gross total resection of a left medial tentorial meningioma with previously seen mass effect on the left thalamus, left middle cerebral peduncle and left midbrain. The current examination demonstrate hemorrhagic products are identified within the resection cavity extending into the parenchyma of the left thalamus and tracking down within the left midbrain and extending to the fourth Ventricle."        Impression and Plan:    Active Hospital Problems    Diagnosis   . 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 Tobacco use disorder   . 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  -- vEEG; no seizures overnight  -- Current AEDs   -- Keppra 1500 BID   -- Vimpat 200 BID   -- Phenytoin 100 TID   -- Onfi 28m52mD  -- vEEG d/c'd 12/31    Pain/sedation  -- Tylenol PRN    Left transverse sinus thrombus  -- CT venogram 07/21/18: Confirmation of subocclusive left transverse sinus thrombus  -- Will f/u with NSGY regarding anticoagulation      Cardiovascular:    Hypotension, may be 2/2 shock  -- Levophed gtt now OFF  -- Received 1.5 L fluid overnight, 1 L fluid 12/29 evening  -- MAP > 65    HTN, chronic  -- SBP goal < 160  --  Hydralazine/labetalol PRN  -- Current antihypertensives: none      Respiratory:    Acute respiratory failure requiring mechanical ventilation s/p tracheostomy   -- ATC  -- CXR 07/22/18: Appears stable, IJ in place  -- Recommenced CT Chest as outpatient      Renal:   Hyponatremia, improved  -- Daily BMP    K goal > 4, Mg goal > 2  -- Continue daily K replacement    Hypocalcemia  -- Ionized WNL      Infectious Disease:   Leukocytosis, may be 2/2 right axilla abscess vs PNA vs meningitis  -- Cultures:    -- Bronch brush 07/21/18: Gram + cocci   -- UA 07/21/18: Clear   -- BC 07/21/18: Pending   -- Blood cultures 12/26: ngtd   -- BAL 12/25: Viridians Streptococcus   -- Urine Cx 12/23 - Candida albicans, replaced foley cath   -- Urine Cx 12/24: Candida Albicans, foley removed   -- Cefepime and Vancomycin (no end date)  -- Ultrasound 07/20/18 of R axilla completed: non-masslike, likely related to cellulitis   -- LP to be completed per NSGY  -- Continue cooling blankets  -- Ibuprofen and tylenol for fever  -- Lactic acid pending      Hematology:  Normocytic anemia   -- 1u pRBCs 12/23 for hbg 6.8  -- Continue to monitor and transfuse if Hgb < 7     DVT prophylaxis  -- SCD's / lovenox     LLE DVT  -- Peripheral Venous Duplex 07/16/18: LLE DVT (calf vein)  -- Duplexes upper and lower extremities ordered for  07/23/18      Gastrointestinal:   Diarrhea  -- C. Diff 12/23 - neg  -- FMS in place, will continue  -- Banana flakes ordered    Nutrition  -- Osmolite 1.5, 1 can Q4H while awake  -- Vit C  -- Protein packet      Endocrine:  No acute issues  -- SSIP conservative      Integumentary:   Right axilla abscess  -- See ID    Excoriation in inner thigh area  -- Bag balm   -- Nystatin powder BID      Psychiatric:  No acute issues  -- Monitor for ICU delirium       Prophylaxis:    DVT/PE Prophylaxis: SCDs, lovenox  GI: NA  Mouth: Peridex      Family:  None present at bedside  Disposition/ Baseline:  ICU  Advance Directives:  None-Discussed  Hospice involvement prior to admission?  no    Code Status Information     Code Status    Full Code          Morrell Riddle, APRN,NP-C  07/23/2018, 06:57        I personally saw and evaluated the patient. See mid-level's note for additional details. My findings/participation are   Stable exam  Localizes  Not following  EEG stable to possible improved, so will remove today  Cellulitis remains present in right axilla  To get LP today  Otherwise Plan as Above in Resident or Midlevel's Note.     Valora Corporal, MD

## 2018-07-23 NOTE — Respiratory Therapy (Signed)
Patient is currently on ATC 28% and toleration well with good saturations. Cuff is inflated on pts. trach due to copious amounts of oral saliva. RT will continue to monitor and wean as tolerable.

## 2018-07-24 LAB — BASIC METABOLIC PANEL
ANION GAP: 7 mmol/L (ref 4–13)
ANION GAP: 7 mmol/L (ref 4–13)
BUN/CREA RATIO: 28 — ABNORMAL HIGH (ref 6–22)
BUN/CREA RATIO: 27 — ABNORMAL HIGH (ref 6–22)
BUN: 15 mg/dL (ref 8–25)
BUN: 14 mg/dL (ref 8–25)
CALCIUM: 8.8 mg/dL (ref 8.5–10.2)
CALCIUM: 8.6 mg/dL (ref 8.5–10.2)
CHLORIDE: 102 mmol/L (ref 96–111)
CHLORIDE: 103 mmol/L (ref 96–111)
CO2 TOTAL: 25 mmol/L (ref 22–32)
CO2 TOTAL: 25 mmol/L (ref 22–32)
CO2 TOTAL: 25 mmol/L (ref 22–32)
CREATININE: 0.54 mg/dL (ref 0.49–1.10)
CREATININE: 0.51 mg/dL (ref 0.49–1.10)
ESTIMATED GFR: >60 mL/min/{1.73_m2} (ref >60)
ESTIMATED GFR: >60 mL/min/1.73mˆ2 (ref >60)
GLUCOSE: 118 mg/dL (ref 65–139)
GLUCOSE: 97 mg/dL (ref 65–139)
POTASSIUM: 4.1 mmol/L (ref 3.5–5.1)
POTASSIUM: 4.1 mmol/L (ref 3.5–5.1)
SODIUM: 134 mmol/L — ABNORMAL LOW (ref 136–145)
SODIUM: 134 mmol/L — ABNORMAL LOW (ref 136–145)
SODIUM: 135 mmol/L — ABNORMAL LOW (ref 136–145)

## 2018-07-24 LAB — PHOSPHORUS: PHOSPHORUS: 3.8 mg/dL (ref 2.4–4.7)

## 2018-07-24 LAB — CBC
HCT: 24.6 % — ABNORMAL LOW (ref 34.8–46.0)
HGB: 8 g/dL — ABNORMAL LOW (ref 11.5–16.0)
MCH: 29.1 pg (ref 26.0–32.0)
MCHC: 32.5 g/dL (ref 31.0–35.5)
MCV: 89.5 fL (ref 78.0–100.0)
MPV: 9.1 fL (ref 8.7–12.5)
PLATELETS: 456 10*3/uL — ABNORMAL HIGH (ref 150–400)
RBC: 2.75 10*6/uL — ABNORMAL LOW (ref 3.85–5.22)
RDW-CV: 15.3 % (ref 11.5–15.5)
WBC: 8.9 10*3/uL (ref 3.7–11.0)

## 2018-07-24 LAB — POC BLOOD GLUCOSE (RESULTS)
GLUCOSE, POC: 124 mg/dL — ABNORMAL HIGH (ref 70–105)
GLUCOSE, POC: 111 mg/dL — ABNORMAL HIGH (ref 70–105)
GLUCOSE, POC: 88 mg/dL (ref 70–105)
GLUCOSE, POC: 97 mg/dL (ref 70–105)
GLUCOSE, POC: 146 mg/dL — ABNORMAL HIGH (ref 70–105)

## 2018-07-24 LAB — MAGNESIUM: MAGNESIUM: 1.9 mg/dL (ref 1.6–2.6)

## 2018-07-24 MED ORDER — POLYVINYL ALCOHOL 1.4 % EYE DROPS
1.0000 [drp] | OPHTHALMIC | Status: DC | PRN
Start: 2018-07-24 — End: 2018-08-08
  Administered 2018-07-28 – 2018-07-29 (×4): 1 [drp] via OPHTHALMIC
  Filled 2018-07-24: qty 15

## 2018-07-24 MED ORDER — ASPIRIN 325 MG TABLET,DELAYED RELEASE
325.0000 mg | DELAYED_RELEASE_TABLET | Freq: Every day | ORAL | Status: DC
Start: 2018-07-24 — End: 2018-07-24
  Filled 2018-07-24: qty 1

## 2018-07-24 MED ORDER — ASPIRIN 325 MG TABLET
325.0000 mg | ORAL_TABLET | Freq: Every day | ORAL | Status: DC
Start: 2018-07-24 — End: 2018-08-08
  Administered 2018-07-24 – 2018-07-27 (×4): 325 mg via GASTROSTOMY
  Administered 2018-07-28: 0 mg via GASTROSTOMY
  Administered 2018-07-29 – 2018-08-07 (×9): 325 mg via GASTROSTOMY
  Filled 2018-07-24 (×17): qty 1

## 2018-07-24 MED ADMIN — sodium chloride 0.9 % intravenous solution: @ 05:00:00 | NDC 00338004904

## 2018-07-24 MED ADMIN — amino ac-protein hydro-whey protein 10 gram-100 kcal/30 mL oral liquid: GASTROSTOMY | @ 08:00:00

## 2018-07-24 MED ADMIN — lidocaine HCL 20 mg/mL (2 %) injection solution: INTRAVENOUS | @ 12:00:00

## 2018-07-24 MED ADMIN — metoclopramide 5 mg/mL injection solution: TRANSDERMAL | @ 08:00:00

## 2018-07-24 NOTE — Progress Notes (Signed)
[facility]  Neurocritical Care (NCCU) Progress Note    [name], [name]  Date of Admission:  07/05/2018  Date of Service: 07/24/2018  Date of Birth:  May 11, 1961    Primary Attending: [name], MD   Primary Service: NEUROSURGERY 1 [place]     Chief Complaint: Right sided weakness s/p left tentorial meningioma resection      Subjective: Patient seen and examined at bedside, patient remains on ATC, no fevers overnight. Patient remains minimally responsive, not following simple commands.     Vital Signs:  Temp (24hrs) Max:37.7 C (12.7 F)      Systolic (51ZGY), FVC:944 , Min:97 , HQP:591     Diastolic (63WGY), KZL:93, Min:57, Max:88    Temp  Avg: 37.2 C (98.9 F)  Min: 36.6 C (97.9 F)  Max: 37.7 C (99.9 F)  MAP (Non-Invasive)  Avg: 83 mmHG  Min: 69 mmHG  Max: 102 mmHG  Pulse  Avg: 101.7  Min: 89  Max: 111  Resp  Avg: 25.5  Min: 19  Max: 31  SpO2  Avg: 97.6 %  Min: 92 %  Max: 100 %  Pain Score (Numeric, Faces): Other    Current Medications:  acetaminophen (TYLENOL) 160 mg per 5 mL liquid - grape flavored, 650 mg, Gastric (NG, OG, PEG, GT), Q4H PRN  ascorbic acid (VITAMIN C) tablet, 500 mg, Gastric (NG, OG, PEG, GT), 2x/day  bacitracin 500 units/gram topical ointment tube, , Topical, 2x/day  banana flakes (BANATROL PLUS) packet, 1 Packet, Gastric (NG, OG, PEG, GT), Q8HRS  cefepime (MAXIPIME) 2 g in NS 100 mL IVPB, 2 g, Intravenous, Q8H  chlorhexidine gluconate (PERIDEX) 0.12% mouthwash, 15 mL, Swish & Spit, 2x/day  cloBAZam (ONFI) 2.5 mg per mL oral liquid, 20 mg, Gastric (NG, OG, PEG, GT), Q8HRS  enoxaparin PF (LOVENOX) 40 mg/0.4 mL SubQ injection, 40 mg, Subcutaneous, Q24H  fentaNYL (SUBLIMAZE) 50 mcg/mL injection, 25 mcg, Intravenous, Q1H PRN  glycopyrrolate (ROBINUL) 0.2 mg/mL (200 mcg/mL) injection, 200 mcg, Intravenous, Q8H PRN  hydrALAZINE (APRESOLINE) injection 10 mg, 10 mg, Intravenous, Q4H PRN  ibuprofen (MOTRIN) tablet, 400 mg, Gastric (NG, OG, PEG, GT), Q6H PRN  labetalol  (TRANDATE) 5 mg/mL injection, 10 mg, Intravenous, Q1H PRN  lacosamide (VIMPAT) tablet, 200 mg, Gastric (NG, OG, PEG, GT), 2x/day  lanolin-oxyquin-pet, hydrophil (BAG BALM) topical ointment, , Apply Topically, 2x/day PRN  levETIRAcetam (KEPPRA) tablet, 1,500 mg, Gastric (NG, OG, PEG, GT), 2x/day  nicotine (NICODERM CQ) transdermal patch (mg/24 hr), 14 mg, Transdermal, Daily  NS flush syringe, 2 mL, Intracatheter, Q8HRS    And  NS flush syringe, 2-6 mL, Intracatheter, Q1 MIN PRN  NS flush syringe, 10-40 mL, Intravenous, Q8HRS  NS flush syringe, 20-30 mL, Intravenous, Q1 MIN PRN  nutrition protein supplement 15 g per 30 mL packet, 1 Packet, Gastric (NG, OG, PEG, GT), Daily  nystatin (MYCOSTATIN) 100,000 units per mL oral liquid, 5 mL, Swish & Spit, 4x/day  nystatin (NYSTOP) 100,000 units/g topical powder, , Apply Topically, 2x/day  ondansetron (ZOFRAN) 2 mg/mL injection, 4 mg, Intravenous, Q6H PRN  perflutren lipid microspheres (DEFINITY) 1.3 mL in NS 10 mL (tot vol) injection, 2 mL, Intravenous, Give in Cardiology  phenytoin (DILANTIN) 100 mg per 4 mL oral liquid, 100 mg, Gastric (NG, OG, PEG, GT), Q8HRS  potassium bicarbonate-citric acid (EFFER-K) effervescent tablet, 40 mEq, Gastric (NG, OG, PEG, GT), Daily with Breakfast  SSIP insulin lispro (HUMALOG) 100 units/mL injection, 0-12 Units, Subcutaneous, Q4H PRN  vancomycin (VANCOCIN) 1,250 mg in NS 250  mL IVPB, 18 mg/kg (Adjusted), Intravenous, Q12H  Vancomycin IV - Pharmacist to Dose per Protocol, , Does not apply, Daily PRN      Today's Physical Exam:   General: Acutely ill  Neurologic: E1 M5 V1T: does not open eyes, intubated.  R pupil 13m/L 29m no facial weakness, + cough.  Motor: Localizing BUE; BLE withdraw. UTA sensation  Cardiovascular: Heart regular rate and rhythm  Lungs: Clear to ascultation bilaterally   Abdomen: soft, non-tender and bowel sounds present   Extremities: no cyanosis or edema  Skin: Skin warm and dry    Labs:  I have reviewed all lab  results.    I/O:  I/O last 24 hours:      Intake/Output Summary (Last 24 hours) at 07/24/2018 0726  Last data filed at 07/24/2018 0700  Gross per 24 hour   Intake 1472 ml   Output 2450 ml   Net -978 ml     I/O current shift:  01/01 0700 - 01/01 1859  In: -   Out: 150 [Urine:150]    Drips:   Current Facility-Administered Medications   Medication Dose Frequency Last Rate         Radiology Results:    CT brain 07/11/18: "1. Stable postoperative changes from left decompressive craniectomy with persistent extracalvarial brain herniation. 2.  Persistent cerebral edema and hemorrhagic tumor resection site. No new hemorrhage."    MRI brain w/wo contrast 07/06/18: "acute postoperative changes related to gross total resection of a left medial tentorial meningioma with previously seen mass effect on the left thalamus, left middle cerebral peduncle and left midbrain. The current examination demonstrate hemorrhagic products are identified within the resection cavity extending into the parenchyma of the left thalamus and tracking down within the left midbrain and extending to the fourth Ventricle."    CT venogram 12/28: "IMPRESSION: 1.  Redemonstration of a subocclusive left transverse sinus thrombus. 2.  Otherwise unchanged appearance of postoperative changes from left tentorial meningioma resection and left frontoparietotemporal craniectomy."      Impression and Plan:    Active Hospital Problems    Diagnosis   . 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 Tobacco use disorder   . Hypokalemia       Neurologic:  L Tentorial meningioma s/p craniotomy for tumor resection, s/p L fronto-temporal craniectomy with evacuation for tumor bed hemorrhage  -- Q2H neuro checks   -- MRI brain w/wo contrast 07/20/18: Post-op changes from meningioma resection, possible subocclusive thrombus in left transverse sinus  -- STAT CT brain without contrast for any acute change in neurological  status   -- Continue rehab services: PT/OT/SLP     Multiple generalized seizures on vEEG, now controlled   -- vEEG removed, 12/31   -- Current AEDs   -- Keppra 1500 BID   -- Vimpat 200 BID   -- Phenytoin 100 TID   -- Onfi 2083mID    Pain/sedation  -- Tylenol and Fentanyl PRN (none required overnight)     Left transverse sinus thrombus  -- CT venogram 07/20/18: Confirmation of subocclusive left transverse sinus thrombus  -- Continue aspirin       Cardiovascular:  No acute issues, hypotension resolved   -- SBP goal < 160, MAP > 65, off all pressors   -- PRN hydralazine/labeatolol (none required overnight)     Respiratory:    Acute respiratory failure requiring mechanical ventilation now s/p tracheostomy   -- ATC continuously   --  Maintain saturations > 94 %   -- CXR 12/29 - patchy opacity right base and left lung atelectasis  -- Peridex BID   -- OOBTC as tolerated, must wear helmet when OOB     Renal:   Hyponatremia, mild   -- Daily BMP    K goal > 4, Mg goal > 2  -- Continue daily K replacement  -- Continue foley for skin excoriation, start bladder training       Infectious Disease: Concern for left axilla cellulitis   -- Afebrile overnight, no leukocytosis   -- Cultures:    -- Bronch brush 07/21/18: < 100 gram + cocci   -- UA 07/21/18: Clear   -- BC 07/21/18: no growth    -- BAL 12/25: Viridians Streptococcus  -- Ultrasound 07/20/18 of R axilla completed: non-masslike, likely related to cellulitis   -- LP 12/31 appears non-infectious   -- Cefepime and Vancomycin - d/c vancomycin and continue cefepime for 7 days total (end date 07/27/18)     Hematology:  Normocytic anemia   -- 1u pRBCs 12/23 for hbg 6.8  -- Continue to monitor and transfuse if Hgb < 7     DVT prophylaxis  -- SCD's / lovenox     LLE DVT  -- Peripheral Venous Duplex 07/16/18: LLE DVT (calf vein)  -- Duplexes repeated 12/31 - chronic left calf DVT, no DVT BUE      Gastrointestinal:   Diarrhea  -- C. Diff 12/23 - neg  -- FMS in place, remove   --  Continue Banana flakes     Nutrition  -- Osmolite 1.5, 1 can Q4H while awake  -- Vit C  -- Protein packet      Endocrine:  No acute issues  -- SSIP conservative      Integumentary:   Right axilla abscess  -- See ID    Excoriation in inner thigh area  -- Bag balm   -- Nystatin powder BID      Psychiatric:  No acute issues  -- Monitor for ICU delirium       Prophylaxis:    DVT/PE Prophylaxis: SCDs, lovenox  GI: NA  Mouth: Peridex      Family:  None present at bedside  Disposition/ Baseline:  ICU  Advance Directives:  None-Discussed  Hospice involvement prior to admission?  no    Code Status Information     Code Status    Full Code          Grant Ruts, APRN,NP-C  07/24/2018, 07:26      I personally saw and evaluated the patient. See mid-level's note for additional details. My findings/participation are   Patient continues to not follow  Blink to threat pleasant today  Axillary cellulitis appears improved  Otherwise Plan as Above in Resident or Midlevel's Note.     Valora Corporal, MD

## 2018-07-24 NOTE — Nurses Notes (Signed)
Patient has been low sinus tach since 1500 (see flowsheet).  All other vital signs stable.  Patient does not appear to be in distress.  Service notified.  No orders obtained at this time.  Will continue to monitor.

## 2018-07-24 NOTE — Progress Notes (Signed)
[facility]  NEUROSURGERY   PROGRESS NOTE    [name], [name], 58 y.o. female  Date of Admission:  07/05/2018  Date of Service: 07/25/2018  Date of Birth:  23-Sep-1960     Chief Complaint: right upper and lower extremity weakness    Subjective: Possible transfer out of ICU this morning.  No further issues with blood pressure and CSF cultures NGTD.    Vital Signs:  Temp (24hrs) Max:37.7 C (40.0 F)      Systolic (86PYP), PJK:932 , Min:103 , IZT:245     Diastolic (80DXI), PJA:25, Min:46, Max:114    Temp  Avg: 37.2 C (98.9 F)  Min: 36.8 C (98.2 F)  Max: 37.7 C (99.9 F)  MAP (Non-Invasive)  Avg: 92.1 mmHG  Min: 68 mmHG  Max: 131 mmHG  Pulse  Avg: 103  Min: 86  Max: 116  Resp  Avg: 25.8  Min: 19  Max: 34  SpO2  Avg: 97.7 %  Min: 92 %  Max: 100 %  Pain Score (Numeric, Faces): Other    Min/Max/Avg ICP/CPP last 24hrs:   No data recorded    Today's Physical Exam:    Appears acutely ill, intubated  GCS _0 T  -- Eyes do not open to noxious stimuli  -- Localizes in LUE, Withdraws in RUE and BLE  PERRL  Face symmetric  +cough    Unable to assess hearing  Unable to assess speech  Unable to assess fund of knowledge  Unable to assess attention span & concentration  Unable to assess recent and remote memory  CN _1 EOMI Unable to assess  CN 11 shrug symmetric Unable to assess  Muscle Strength Unable to assess  Unable to assess drift    -- L fronto-temporal incision c/d/i, staples removed  -- JP drain Rapide  -- LD Rapide    Assessment/Plan:  [name] is a 58 yo F PMH HTN, HLD w/ 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 POD20/20.  -- FU video EEG   -- Keppra 1500 mg 2x/day   -- Vimpat 200 mg 2x/day   -- Dilantin 100 mg q8h   -- Clobazam 20 mg q8h   -- Will need outpatient neurology follow-up  -- Please  bathe patient daily and clean wound daily  -- Okay for soap/shampoo  -- Apply bacitracin ointment BID, cleaning old ointment off before application of new ointment  -- Final pathology: WHO grade I meningioma  -- FU blood pressure   -- Previously started Carvedilol 12.5 mg 2x/day and Lisinopril 40 mg daily held  -- IR LP completed previously   -- CSF protein: 31   -- CSF gluclose: 65   -- CSF culture (07/23/18): NGTD     -- Imaging:   -- Venous duplex LLE (07/23/18): 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   -- CTV intra w/wo (07/20/18): sub-occlusive left transverse sinus thrombus   -- US soft tissue axilla (07/20/18): nonspecific area of increased echogenicity seen at the site of concern in the right axilla measuring approximately 6.5 x 2.0 x 8.9 cm; appears nonmass-like with hazy borders and is likely related to cellulitis   -- MRI-brain w/wo (07/20/18): possible non-occlusive thrombus in transverse sinus   -- CT-brain wo (07/19/18): interval decrease of hematoma   -- Venous duplex BLE (07/16/18): left  peroneal (calf vein) DVT   -- Venous duplex BUE (07/16/18): no evidence of DVT   -- TTE (07/12/18): EF 75%, no regional wall abnormalities   -- CT brain w/o (07/11/18): stable    -- CT brain w/o (07/09/18 @ 1328):stable   -- CT brain w/o (07/09/18 @ 0430):stable   -- CT brain w/o (07/08/18):postop changes from decompressive craniectomy with interval mild extracalvarial brain herniation through the craniectomy site; mild increase in cerebral edema at the tumor resection bed and adjacent parenchyma without significant increase in hemorrhage   -- MRI brain w/wo (07/06/18):gross total resection of a left medial tentorial meningioma; hemorrhagic productswithin the resection cavity extending into the parenchyma of the left thalamus and tracking down within the left midbrain and extending to the fourth ventricle   -- CT brain w/o (07/06/18): postop changes from left  decompressive craniectomy for evacuation of hematoma of tumor bed with residual hemorrhage and improved edema and rightward midline shift  -- CT brain w/o (07/06/18): postop changes from decompressive craniectomy for evacuation of tumor bed hematoma with minimal residual blood at the surgical site   -- CT brain w/o (07/05/18): postop changes with development of hematoma at tumor bed and 8 mm of midline shift  -- Pain/spasm control: Tylenol PRN, Fentanyl PRN, Motrin PRN  -- Diet: NPO + Osmolite TF  -- Bowel regimen held, last BM 07/24/18  -- Abx: Cefepime for cellulitis, started 07/21/18 w/ end-date of 07/28/18   -- Bronch Brush (07/21/18): 1+ PMN, NGTD   -- Blood Cx (07/21/18): in process   -- UA (07/21/18): WNL   -- BAL (07/17/18): 15000 Anginosus group Viridans Streptococcus   -- CDiff (07/15/18): negative   -- Urine Cx (07/16/18): C. albicans  -- Activity: Up in chair with assist TID   -- Craniectomy precautions, helmet to be worn at all times when OOB  -- PT/OT: SNF vs LTAC  -- DVT ppx: Lovenox, SCDs/Venodynes  -- Consults:    NCCU/Neurology   -- Will need outpatient neurology follow-up for AED management   Trauma   -- Trach/PEG completed 07/15/18  -- Lines/Drains: Trach/PEG, R IJ, Foley catheter, cooling blanket  -- Wound: L fronto-temporal incision (staples removed), JP drain Rapide, LD Rapide, helmet  -- Disposition: ongoing    Reola Calkins, MD, PhD  Department of Neurosurgery  PGY5        I saw and examined the patient.  I reviewed the resident's note.  I agree with the findings and plan of care as documented in the resident's note.  Any exceptions/additions are edited/noted.    [name], MD

## 2018-07-24 NOTE — Care Plan (Signed)
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 GCS 1/5/1. Pt remained normothermic w/ Tylenol x1 (no cooling blanket req'd). Antibiotic treatment continues and R axillary mass appears slightly smaller. Aggressive pulmonary toileting continued. Patient's secretions have decreased and breath sounds, though still coarse and diminished, aren't as coarse as the previous night. Foley output has been sufficient. Flexi-Seal approx. 25 ml additional output.        Patient Goals Based on Modifiable Risk Factors     The following modifiable risk factors were discussed with patient:      Cigarette Smoking    Goal(s):  Complete Cessation     Patient's selected lifestyle modification(s):  Stop smoking with support of counseling  Stop smoking with support of education on skills to quit  Stop smoking with support of medications and social support  Minimize opportunities when around individuals smoking     Patient/Family/Caregiver response to plan:     Patient unable to respond (1/5/1 and trach).        Problem: Adult Inpatient Plan of Care  Goal: Patient-Specific Goal (Individualization)  Outcome: Ongoing (see interventions/notes)  Flowsheets (Taken 07/22/2018 0357)  Individualized Care Needs: Pt went from requiring cooling blanket to requiring warming blanket (hypo/hyperthermia blanket)  Anxieties, Fears or Concerns: Daughters remain concerned about why pt's temperature fluctuates so much     Problem: Seizure, Active Management  Goal: Absence of Seizure/Seizure-Related Injury  Outcome: Ongoing (see interventions/notes)

## 2018-07-24 NOTE — Pharmacy Vancomycin Dosing (Signed)
[facility] / Department of Pharmaceutical Services  Therapeutic Drug Monitoring: Vancomycin  07/24/2018      Patient name: [name], [name]  Date of Birth:  [birth year]    Actual Weight:  Weight: 91.4 kg (201 lb 8 oz) (07/24/18 0000)     BMI:  BMI (Calculated): 32.61 (07/24/18 0000)    Date RPh Current regimen (including mg/kg) Indication Target Levels (mcg/mL) SCr (mg/dL) CrCl* (mL/min) Measured level (mcg/mL) Plan (including when levels are due) Comments   12/29 JAG -- Empiric Therapy/  SSTI  13-17 0.62 109.7 -- Start Vancomycin 18 mg/kg (1250 mg) Q12hr    Level prior to 4th dose or as clinically indicated     01/01 1015 kop Vancomycin dc'd         Cx remain neg  CSF no organisms                                                                  *Creatinine clearance is estimated by using the Cockcroft-Gault equation for adult patients and the Carol Ada for pediatric patients.    The decision to discontinue vancomycin therapy will be determined by the primary service.  Please contact the pharmacist with any questions regarding this patient's medication regimen.

## 2018-07-24 NOTE — Care Plan (Signed)
Patient GCS 1/5/1.  VSS.  Patient does not appear to be in pain.  Frequent oral and trach suctioning completed throughout the day.  Trach care provided to patient and tolerated well.  She continues to tolerate her tube feeding.  Flexiseal removed today.  Foley clamped every 4 hours for bladder training, tolerating well with adequate urine output.  Patient continues to thermoregulate.  No events throughout the day.

## 2018-07-24 NOTE — Progress Notes (Signed)
[facility]  NEUROSURGERY   PROGRESS NOTE    [name], 58 y.o. female  Date of Admission:  07/05/2018  Date of Service: 07/24/2018  Date of Birth:  11-02-1960     Chief Complaint: right upper and lower extremity weakness    Subjective: LP completed by IR. NAEO.    Vital Signs:  Temp (24hrs) Max:37.7 C (16.1 F)      Systolic (09UEA), VWU:981 , Min:103 , XBJ:478     Diastolic (29FAO), ZHY:86, Min:46, Max:114    Temp  Avg: 37.4 C (99.3 F)  Min: 37.1 C (98.8 F)  Max: 37.7 C (99.9 F)  MAP (Non-Invasive)  Avg: 90.9 mmHG  Min: 68 mmHG  Max: 131 mmHG  Pulse  Avg: 106.6  Min: 99  Max: 116  Resp  Avg: 26.2  Min: 20  Max: 34  SpO2  Avg: 97.5 %  Min: 92 %  Max: 100 %  Pain Score (Numeric, Faces): Other    Min/Max/Avg ICP/CPP last 24hrs:   No data recorded    Today's Physical Exam:    Appears acutely ill, intubated  GCS _0 T  -- Eyes do not open to noxious stimuli  -- Localizes in LUE, Withdraws in RUE and BLE  PERRL  Face symmetric  +cough    Unable to assess hearing  Unable to assess speech  Unable to assess fund of knowledge  Unable to assess attention span & concentration  Unable to assess recent and remote memory  CN _1 EOMI Unable to assess  CN 11 shrug symmetric Unable to assess  Muscle Strength Unable to assess  Unable to assess drift    -- L fronto-temporal incision c/d/i, staples removed  -- JP drain Rapide  -- LD Rapide    Assessment/Plan:  [name] is a 58 yo F PMH HTN, HLD w/ 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 POD19/19.  -- FU video EEG   -- Keppra 1500 mg 2x/day   -- Vimpat 200 mg 2x/day   -- Dilantin 100 mg q8h   -- Clobazam 20 mg q8h   -- Will need outpatient neurology follow-up  -- Please bathe patient daily and clean wound daily  -- Okay for  soap/shampoo  -- Apply bacitracin ointment BID, cleaning old ointment off before application of new ointment  -- Final pathology: WHO grade I meningioma  -- FU blood pressure   -- Previously started Carvedilol 12.5 mg 2x/day and Lisinopril 40 mg daily held    -- Imaging:   -- Venous duplex LLE (07/23/18): ORDERED   -- CTV intra w/wo (07/20/18): sub-occlusive left transverse sinus thrombus   -- US soft tissue axilla (07/20/18): nonspecific area of increased echogenicity seen at the site of concern in the right axilla measuring approximately 6.5 x 2.0 x 8.9 cm; appears nonmass-like with hazy borders and is likely related to cellulitis   -- MRI-brain w/wo (07/20/18): possible non-occlusive thrombus in transverse sinus   -- CT-brain wo (07/19/18): interval decrease of hematoma   -- Venous duplex BLE (07/16/18): left peroneal (calf vein) DVT   -- Venous duplex BUE (07/16/18): no evidence of DVT   -- TTE (07/12/18): EF 75%, no regional wall abnormalities   -- CT brain w/o (07/11/18): stable    -- CT brain w/o (07/09/18 @ 1328):stable   -- CT  brain w/o (07/09/18 @ 0430):stable   -- CT brain w/o (07/08/18):postop changes from decompressive craniectomy with interval mild extracalvarial brain herniation through the craniectomy site; mild increase in cerebral edema at the tumor resection bed and adjacent parenchyma without significant increase in hemorrhage   -- MRI brain w/wo (07/06/18):gross total resection of a left medial tentorial meningioma; hemorrhagic productswithin the resection cavity extending into the parenchyma of the left thalamus and tracking down within the left midbrain and extending to the fourth ventricle   -- CT brain w/o (07/06/18): postop changes from left decompressive craniectomy for evacuation of hematoma of tumor bed with residual hemorrhage and improved edema and rightward midline shift  -- CT brain w/o (07/06/18): postop changes from decompressive craniectomy for  evacuation of tumor bed hematoma with minimal residual blood at the surgical site   -- CT brain w/o (07/05/18): postop changes with development of hematoma at tumor bed and 8 mm of midline shift  -- Pain/spasm control: Tylenol PRN, Fentanyl PRN, Motrin PRN  -- Diet: NPO + Osmolite TF  -- Bowel regimen held, FMS in place  -- Abx: C/V empirically for septic shock, started 07/21/18   -- Bronch Brush (07/21/18): 1+ PMN, NGTD   -- Blood Cx (07/21/18): in process   -- UA (07/21/18): WNL   -- BAL (07/17/18): 15000 Anginosus group Viridans Streptococcus   -- CDiff (07/15/18): negative   -- Urine Cx (07/16/18): C. albicans  -- Activity: Up in chair with assist TID   -- Craniectomy precautions, helmet to be worn at all times when OOB  -- PT/OT: SNF vs LTAC  -- DVT ppx: Lovenox, SCDs/Venodynes  -- Consults:    NCCU/Neurology   -- Will need outpatient neurology follow-up for AED management   Trauma   -- Tracheal stay sutures to be removed 1/6   -- Trach/PEG completed 07/15/18  -- Lines/Drains: Trach/PEG, FMS, R IJ, Foley catheter, cooling blanket  -- Wound: L fronto-temporal incision (staples removed), JP drain Rapide, LD Rapide, helmet  -- Disposition: ongoing    Keane Police, M.D.  PGY-2 Neurosurgery  07/24/2018, 20:30    I saw and examined the patient.  I reviewed the resident's note.  I agree with the findings and plan of care as documented in the resident's note.  Any exceptions/additions are edited/noted.    Leeroy Cha, MD

## 2018-07-25 LAB — BASIC METABOLIC PANEL
ANION GAP: 6 mmol/L (ref 4–13)
ANION GAP: 7 mmol/L (ref 4–13)
BUN/CREA RATIO: 27 — ABNORMAL HIGH (ref 6–22)
BUN/CREA RATIO: 23 — ABNORMAL HIGH (ref 6–22)
BUN: 13 mg/dL (ref 8–25)
BUN: 13 mg/dL (ref 8–25)
CALCIUM: 8.6 mg/dL (ref 8.5–10.2)
CALCIUM: 8.6 mg/dL (ref 8.5–10.2)
CALCIUM: 9.2 mg/dL (ref 8.5–10.2)
CHLORIDE: 104 mmol/L (ref 96–111)
CHLORIDE: 102 mmol/L (ref 96–111)
CO2 TOTAL: 27 mmol/L (ref 22–32)
CO2 TOTAL: 27 mmol/L (ref 22–32)
CREATININE: 0.49 mg/dL (ref 0.49–1.10)
CREATININE: 0.57 mg/dL (ref 0.49–1.10)
ESTIMATED GFR: >60 mL/min/1.73mˆ2 (ref >60)
ESTIMATED GFR: >60 mL/min/1.73mˆ2 (ref >60)
GLUCOSE: 95 mg/dL (ref 65–139)
GLUCOSE: 153 mg/dL — ABNORMAL HIGH (ref 65–139)
POTASSIUM: 3.9 mmol/L (ref 3.5–5.1)
POTASSIUM: 4.4 mmol/L (ref 3.5–5.1)
SODIUM: 137 mmol/L (ref 136–145)
SODIUM: 136 mmol/L (ref 136–145)

## 2018-07-25 LAB — CBC WITH DIFF
BASOPHIL #: <0.1 x10ˆ3/uL (ref <=0.20)
BASOPHIL %: 1 %
EOSINOPHIL #: 0.29 x10ˆ3/uL (ref <=0.50)
EOSINOPHIL %: 3 %
HCT: 27.9 % — ABNORMAL LOW (ref 34.8–46.0)
HGB: 8.7 g/dL — ABNORMAL LOW (ref 11.5–16.0)
IMMATURE GRANULOCYTE #: 0.12 x10ˆ3/uL — ABNORMAL HIGH (ref <0.10)
IMMATURE GRANULOCYTE %: 1 % (ref 0–1)
LYMPHOCYTE #: 1.24 x10ˆ3/uL (ref 1.00–4.80)
LYMPHOCYTE %: 14 %
MCH: 28.2 pg (ref 26.0–32.0)
MCHC: 31.2 g/dL (ref 31.0–35.5)
MCV: 90.6 fL (ref 78.0–100.0)
MONOCYTE #: 0.63 x10ˆ3/uL (ref 0.20–1.10)
MONOCYTE %: 7 %
MPV: 9 fL (ref 8.7–12.5)
NEUTROPHIL #: 6.56 x10ˆ3/uL (ref 1.50–7.70)
NEUTROPHIL %: 74 %
PLATELETS: 484 10*3/uL — ABNORMAL HIGH (ref 150–400)
RBC: 3.08 x10ˆ6/uL — ABNORMAL LOW (ref 3.85–5.22)
RDW-CV: 15.1 % (ref 11.5–15.5)
WBC: 8.9 x10ˆ3/uL (ref 3.7–11.0)

## 2018-07-25 LAB — PHOSPHORUS
PHOSPHORUS: 3.8 mg/dL (ref 2.4–4.7)
PHOSPHORUS: 3.3 mg/dL (ref 2.4–4.7)

## 2018-07-25 LAB — CBC
HCT: 23.1 % — ABNORMAL LOW (ref 34.8–46.0)
HGB: 7.3 g/dL — ABNORMAL LOW (ref 11.5–16.0)
MCH: 28.7 pg (ref 26.0–32.0)
MCHC: 31.6 g/dL (ref 31.0–35.5)
MCV: 90.9 fL (ref 78.0–100.0)
MPV: 9.1 fL (ref 8.7–12.5)
PLATELETS: 400 x10ˆ3/uL (ref 150–400)
RBC: 2.54 x10ˆ6/uL — ABNORMAL LOW (ref 3.85–5.22)
RDW-CV: 15.3 % (ref 11.5–15.5)
WBC: 6.4 x10ˆ3/uL (ref 3.7–11.0)

## 2018-07-25 LAB — POC BLOOD GLUCOSE (RESULTS)
GLUCOSE, POC: 97 mg/dL (ref 70–105)
GLUCOSE, POC: 103 mg/dL (ref 70–105)
GLUCOSE, POC: 87 mg/dL (ref 70–105)
GLUCOSE, POC: 119 mg/dL — ABNORMAL HIGH (ref 70–105)
GLUCOSE, POC: 107 mg/dL — ABNORMAL HIGH (ref 70–105)

## 2018-07-25 LAB — MAGNESIUM
MAGNESIUM: 2 mg/dL (ref 1.6–2.6)
MAGNESIUM: 1.9 mg/dL (ref 1.6–2.6)

## 2018-07-25 MED ORDER — CEPHALEXIN 250 MG/5 ML ORAL SUSPENSION
500.00 mg | INHALATION_SUSPENSION | Freq: Four times a day (QID) | ORAL | Status: AC
Start: 2018-07-25 — End: 2018-07-28
  Administered 2018-07-25 – 2018-07-28 (×12): 500 mg via GASTROSTOMY
  Filled 2018-07-25 (×12): qty 10

## 2018-07-25 MED ADMIN — sodium chloride 0.9 % (flush) injection syringe: @ 06:00:00

## 2018-07-25 MED ADMIN — dextrose 5 % and 0.45 % sodium chloride intravenous solution: TOPICAL | @ 08:00:00 | NDC 00338008504

## 2018-07-25 MED ADMIN — phenytoin 100 mg/4 mL oral suspension: GASTROSTOMY | @ 06:00:00

## 2018-07-25 MED ADMIN — sodium chloride 0.9 % (flush) injection syringe: GASTROSTOMY | @ 08:00:00

## 2018-07-25 MED ADMIN — dextrose 5 % and 0.45 % sodium chloride intravenous solution: TOPICAL | @ 20:00:00 | NDC 00338008504

## 2018-07-25 MED ADMIN — mupirocin 2 % topical ointment: ORAL | @ 20:00:00 | NDC 45802011222

## 2018-07-25 NOTE — Care Management Notes (Signed)
[facility]  Care Management Note    Patient Name: [name]  Date of Birth: [birth year]  Sex: female  Date/Time of Admission: 07/05/2018  7:26 AM  Room/Bed: 11/A  Payor: HEALTH PLAN / Plan: HEALTH PLAN (PPO-NON ST EMP) / Product Type: PPO /    LOS: 20 days   Primary Care Providers:  Isac Caddy, MD, MD (General)    Admitting Diagnosis:  Meningioma (CMS [facility]) [D32.9]    Assessment:      07/25/18 1746   Assessment Details   Assessment Type Continued Assessment   Date of Care Management Update 07/25/18   Date of Next DCP Update 07/26/18   Care Management Plan   Discharge Planning Status plan in progress   Projected Discharge Date 07/30/18   Discharge Needs Assessment   Discharge Facility/Level of Care Needs Undetermined at this time   Patient not medically ready for discharge at this time.  She continues in NCCU.  Plan is to continue to monitor, step down, PT/OT.    Discharge Plan:  Undetermined at this time  Discharge plans undetermined pending patient progress and PT/OT recommendations.    The patient will continue to be evaluated for developing discharge needs.     Case Manager: Claudius Sis, [place] COORDINATOR  Phone: [phone]

## 2018-07-25 NOTE — Procedures (Signed)
NAMEDREAM, HARMAN   HOSPITAL NUMBER:  [id number]  DATE OF SERVICE:  07/22/2018  DOB:  June 28, 1961  SEX:  F      Date and Time: July 22, 2018, from 0800 to July 23, 2018, at [phone].   Technician: AP.   EEG [id number].    REQUESTING PROVIDER:  Theodora Blow, APRN, NP-C.    HISTORY:  A 58 year old female with epileptiform discharges and seizures.    INTERPRETATION:  Abnormal long-term EEG due to:  1. Left hemispheric sharp waves and LPDs maximally 1.5 Hz, state dependent, indicating highly increased epileptogenic potential in the left hemisphere.  2. Right hemispheric spikes indicating locally increased epileptogenic potential.  3. Left hemispheric slowing indicating left hemispheric dysfunction.  4. Diffuse slowing indicating nonspecific encephalopathy.     Findings are slightly improved.  Clinical correlation required.    EEG DESCRIPTION:  This is a digitally acquired long-term video EEG with electrodes placed in accordance with the international 10-20 system.     The posterior dominant rhythm was maximally 7 Hz and seen on the right.  The waking background was continuous but asymmetric.  On the left, it consisted of disorganized theta with some delta and alpha.  On the right, it consisted of fairly organized alpha and theta with some delta.  State change was evident, but there were no normal sleep transients.     There were left hemispheric sharp waves.  In wakefulness, these were abundant and frequently formed intermediate duration runs of 1 to 1.5 Hz lateralized periodic discharges, and in sleep,   they were merely frequent.  There were abundant single right hemispheric spikes.  There were no electrographic seizures or clinical spells.        Ilda Mori, MD  Assistant Professor   [place] [facility] Neurology        CC:   Theodora Blow, APRN, NP-C   [address]   [address]   [place], [place] [address]       DD:  07/24/2018 15:21:09  DT:  07/24/2018 20:01:28 [place]  D#:  [id number]

## 2018-07-25 NOTE — Care Plan (Signed)
Pt admitted 12/13 s/p crani s/p tumor resection. GCS 1-5-1. Seizure and safety precautions maintained with hourly rounding. Skin precautions maintained with Q2h turning/repositioning.     Patient Goals Based on Modifiable Risk Factors    The following modifiable risk factors were discussed with patient:     Cigarette Smoking    Goal(s):  Complete Cessation    Patient's selected lifestyle modification(s):  None    Patient/Family/Caregiver response to plan:    Patient is trached and unable to verbalize understanding at this time. Will continue to assess for better educational opportunities.

## 2018-07-25 NOTE — Care Plan (Signed)
[place]  Physical Therapy Treatment    Patient Name: Terena Bohan  Date of Birth: Jun 28, 1961  Height: Height: 167.6 cm (5' 5.98")  Weight: Weight: 87.6 kg (193 lb 2 oz)  Room/Bed: 11/A  Payor: HEALTH PLAN / Plan: HEALTH PLAN (PPO-NON ST EMP) / Product Type: PPO /       Assessment:      (P) [name] continues to remain minimally responsive to stimuli with no eye opening and no tracking, or convergence /visual fixation when assisted to open eyes. She continues to demonstrate easily functional PROM of all joints all 4 limbs and no active movement of same. PT will continue to follow for PROM and assessment    Discharge Needs:    Equipment Recommendation: (P) TBD   Discharge Disposition: (P) TBD    JUSTIFICATION OF DISCHARGE RECOMMENDATION   Based on current diagnosis, functional performance prior to admission, and current functional performance, this patient requires continued PT services in (P) TBD in order to achieve significant functional improvements in these deficit areas: (P) aerobic capacity/endurance, arousal, attention, and cognition, gait, locomotion, and balance, neuromuscular, ventilation and respiration/gas exchange.    Plan:   Current Intervention:    To provide physical therapy services (P) minimum of 2x/week  for duration of (P) until discharge.    The risks/benefits of therapy have been discussed with the patient/caregiver and he/she is in agreement with the established plan of care.       Past Medical History:   Diagnosis Date   . Anxiety    . Arthropathy    . Cancer (CMS HCC)     brain tumor   . Dyspnea on exertion    . Esophageal reflux     does not take meds   . Heart murmur     benign   . HTN (hypertension)    . Hyperlipemia    . Muscle weakness     right sided weakness   . Palpitations    . Shortness of breath    . Wears glasses     reading         Past Surgical History:   Procedure Laterality Date   . HX APPENDECTOMY     . HX BREAST AUGMENTATION  Bilateral    . HX LAP CHOLECYSTECTOMY     . HX LUMBAR DISKECTOMY  1999   . HX OTHER Right     benign tumor removed from upper right leg x3   . HX OTHER      exploratory lap   . HX TUBAL LIGATION            reports that she has been smoking cigarettes. She has a 30.00 pack-year smoking history. She has never used smokeless tobacco. She reports that she does not drink alcohol or use drugs.  Social History     Tobacco Use   Smoking Status Current Every Day Smoker   . Packs/day: 1.00   . Years: 30.00   . Pack years: 30.00   . Types: Cigarettes   Smokeless Tobacco Never Used   Tobacco Comment    down to 8 cigarettes, counseled on 1-800-QUIT_NOW         Subjective & Objective        07/25/18 1257   Therapist Pager   PT Assigned/ Pager # [phone]   Rehab Session   Document Type therapy progress note (daily note)   Total PT Minutes: 15   Patient Effort poor  General Information   Patient Profile Reviewed? yes   Medical Lines PEG Tube;PIV Line;Telemetry   Respiratory Status aerosol trach collar   Existing Precautions/Restrictions aspiration precautions;fall precautions;full code   Mutuality/Individual Preferences   Individualized Care Needs OOB with maxi Move   Self-Care   Current Activity Tolerance poor   Pre Treatment Status   Pre Treatment Patient Status Patient supine in bed;Call light within reach;Telephone within reach;Sitter select activated;Nurse approved session   Support Present Pre Treatment  None   Communication Pre Treatment    (briefly spoke with family, who left at start to get lunch)   Cognitive Assessment/Interventions   Behavior/Mood Observations unable to arouse   Orientation Status unable/difficult to assess   Attention severe impairment   Follows Commands does not follow one step commands   Vital Signs   Vitals Comment VSS and on monitor t/o   Pain Assessment   Pre/Post Treatment Pain Comment Brief grimmacing with initial PROM LUE but then no reaction to ongoing ROM in varied planes with gentle  sustained end -range stretches   LUE Assessment   LUE ROM PROM WNL   LUE Strength no active movement, 0/5   LUE Tone moderately increased tone  (but fluctuated significantly during PROM)   RLE Assessment   RLE ROM WNL   RLE Strength no active movement, 0/5   RLE Tone mildly increased tone   LLE Assessment   LLE ROM WNL   LLE Strength no active movement, 0/5   LLE Tone mildly increased tone   Therapeutic Exercise/Activity   Comment PROM performed with gentle sustained end-range stretches to BLEs and LUE ( OT had performed extensive PROM and stretches to RUE and this limb was not treated)   Post Treatment Status   Post Treatment Patient Status Patient supine in bed;Call light within reach;Telephone within reach;Venodynes in place and activated   Support Present Post Treatment  None   Plan of Care Review   Plan Of Care Reviewed With patient   Basic Mobility Am-PAC/6Clicks Score   Turning in bed without bedrails 1   Lying on back to sitting on edge of flat bed 1   Moving to and from a bed to a chair 1   Standing up from chair 1   Walk in room 1   Climbing 3-5 steps with railing 1   6 Clicks Raw Score total 6   Standardized (t-scale) score 16.59   CMS 0-100% Score 100   CMS Modifier CN   Patient Mobility Goal (JHHLM) 2- Perform PROM 2X/day   Exercise/Activity Level Performed 2- Turned self in bed/ROM (active or passive)   Physical Therapy Clinical Impression   Assessment Ms Perrin Maltese continues to remain minimally responsive to stimuli with no eye opening and no tracking, or convergence /visual fixation when assisted to open eyes. She continues to demonstrate easily functional PROM of all joints all 4 limbs and no active movement of same. PT will continue to follow for PROM and assessment   Criteria for Skilled Therapeutic meets criteria   Pathology/Pathophysiology Noted neuromuscular;other (see comments)  (vision, somatosensory, cogntion)   Impairments Found (describe specific impairments) aerobic  capacity/endurance;arousal, attention, and cognition;gait, locomotion, and balance;neuromuscular;ventilation and respiration/gas exchange   Functional Limitations in Following  self-care;home management;work;community/leisure   Disability: Inability to Perform work;community/leisure   Rehab Potential fair, will monitor progress closely   Therapy Frequency minimum of 2x/week   Predicted Duration of Therapy Intervention (days/wks) until discharge   Anticipated Equipment Needs at Discharge (PT) TBD  Anticipated Discharge Disposition TBD         Therapist:   Cherlyn Labella, PT   Pager #: [phone]

## 2018-07-25 NOTE — Care Plan (Signed)
Problem: Adult Inpatient Plan of Care  Goal: Plan of Care Review  Outcome: Ongoing (see interventions/notes)     Problem: Adult Inpatient Plan of Care  Goal: Patient-Specific Goal (Individualization)  Outcome: Ongoing (see interventions/notes)     Problem: Adult Inpatient Plan of Care  Goal: Absence of Hospital-Acquired Illness or Injury  Outcome: Ongoing (see interventions/notes)     Problem: Skin Injury Risk Increased  Goal: Skin Health and Integrity  Outcome: Ongoing (see interventions/notes)     Problem: Infection (Craniotomy/Craniectomy/Cranioplasty)  Goal: Absence of Infection Signs/Symptoms  Outcome: Ongoing (see interventions/notes)    Patient is turned q 2 hours. Trach care preformed. Patient continues to have a large thick amount of secretions. Patient vitals are stable WNL. Patient bathed and bed sheets changed. Bladder training continuous. Daughter updated on care. Patient shows no signs of learning. Will continue to evaluate and assess patient.    Patient Goals Based on Modifiable Risk Factors    The following modifiable risk factors were discussed with patient:     Cigarette Smoking    Goal(s):  Complete Cessation    Patient's selected lifestyle modification(s):  Stop smoking with support of counseling  Stop smoking with support of education on skills to quit  Stop smoking with support of medications and social support  Minimize opportunities when around individuals smoking    Patient/Family/Caregiver response to plan:    Patient has trach and is unresponsive. Patient is currently being treated with nicotine patch. Will continue to assess.

## 2018-07-25 NOTE — Care Plan (Signed)
07/15/18 1430  OXYGEN - AEROSOL TRACH COLLAR CONTINUOUS    Discontinue   Comments: FIO2 > 50% Will be set up with Double Flow O2   Duration: Until Specified    Priority: Routine       Question Answer Comment   FIO2 (%) 28    Indications for O2 IMPROVE OXYGENATION          Patient with increased amount of secretions, no other issues, wean as tolerates.

## 2018-07-25 NOTE — Progress Notes (Signed)
[facility]  Neurocritical Care (NCCU) Progress Note    Broady, Perris  Date of Admission:  07/05/2018  Date of Service: 07/25/2018  Date of Birth:  1961-01-30    Primary Attending: [name], MD   Primary Service: NEUROSURGERY 1 [place]     Chief Complaint: Right sided weakness s/p left tentorial meningioma resection      Subjective: Patient seen and examined at bedside, no acute events overnight. Patient remains on ATC with unchanged neurological exam.     Vital Signs:  Temp (24hrs) Max:37.7 C (63.0 F)      Systolic (16WFU), XNA:355 , Min:103 , DDU:202     Diastolic (54YHC), WCB:76, Min:46, Max:114    Temp  Avg: 37.2 C (98.9 F)  Min: 36.8 C (98.2 F)  Max: 37.7 C (99.9 F)  MAP (Non-Invasive)  Avg: 92.6 mmHG  Min: 68 mmHG  Max: 131 mmHG  Pulse  Avg: 103  Min: 86  Max: 116  Resp  Avg: 25.4  Min: 19  Max: 34  SpO2  Avg: 97.8 %  Min: 92 %  Max: 100 %  Pain Score (Numeric, Faces): Other    Current Medications:  acetaminophen (TYLENOL) 160 mg per 5 mL liquid - grape flavored, 650 mg, Gastric (NG, OG, PEG, GT), Q4H PRN  artificial tears ophthalmic solution, 1 Drop, Both Eyes, Q2H PRN  ascorbic acid (VITAMIN C) tablet, 500 mg, Gastric (NG, OG, PEG, GT), 2x/day  aspirin tablet 325 mg, 325 mg, Gastric (NG, OG, PEG, GT), Daily  bacitracin 500 units/gram topical ointment tube, , Topical, 2x/day  cefepime (MAXIPIME) 2 g in NS 100 mL IVPB, 2 g, Intravenous, Q8H  chlorhexidine gluconate (PERIDEX) 0.12% mouthwash, 15 mL, Swish & Spit, 2x/day  cloBAZam (ONFI) 2.5 mg per mL oral liquid, 20 mg, Gastric (NG, OG, PEG, GT), Q8HRS  enoxaparin PF (LOVENOX) 40 mg/0.4 mL SubQ injection, 40 mg, Subcutaneous, Q24H  glycopyrrolate (ROBINUL) 0.2 mg/mL (200 mcg/mL) injection, 200 mcg, Intravenous, Q8H PRN  hydrALAZINE (APRESOLINE) injection 10 mg, 10 mg, Intravenous, Q4H PRN  ibuprofen (MOTRIN) tablet, 400 mg, Gastric (NG, OG, PEG, GT), Q6H PRN  labetalol (TRANDATE) 5 mg/mL injection, 10 mg, Intravenous, Q1H  PRN  lacosamide (VIMPAT) tablet, 200 mg, Gastric (NG, OG, PEG, GT), 2x/day  lanolin-oxyquin-pet, hydrophil (BAG BALM) topical ointment, , Apply Topically, 2x/day PRN  levETIRAcetam (KEPPRA) tablet, 1,500 mg, Gastric (NG, OG, PEG, GT), 2x/day  nicotine (NICODERM CQ) transdermal patch (mg/24 hr), 14 mg, Transdermal, Daily  NS flush syringe, 2 mL, Intracatheter, Q8HRS    And  NS flush syringe, 2-6 mL, Intracatheter, Q1 MIN PRN  NS flush syringe, 10-40 mL, Intravenous, Q8HRS  NS flush syringe, 20-30 mL, Intravenous, Q1 MIN PRN  nutrition protein supplement 15 g per 30 mL packet, 1 Packet, Gastric (NG, OG, PEG, GT), Daily  nystatin (MYCOSTATIN) 100,000 units per mL oral liquid, 5 mL, Swish & Spit, 4x/day  nystatin (NYSTOP) 100,000 units/g topical powder, , Apply Topically, 2x/day  ondansetron (ZOFRAN) 2 mg/mL injection, 4 mg, Intravenous, Q6H PRN  perflutren lipid microspheres (DEFINITY) 1.3 mL in NS 10 mL (tot vol) injection, 2 mL, Intravenous, Give in Cardiology  phenytoin (DILANTIN) 100 mg per 4 mL oral liquid, 100 mg, Gastric (NG, OG, PEG, GT), Q8HRS  potassium bicarbonate-citric acid (EFFER-K) effervescent tablet, 40 mEq, Gastric (NG, OG, PEG, GT), Daily with Breakfast  SSIP insulin lispro (HUMALOG) 100 units/mL injection, 0-12 Units, Subcutaneous, Q4H PRN      Today's Physical Exam:   General: Acutely  ill  Neurologic: E1 M5 V1T: does not open eyes, intubated.  R pupil 78m/L 237m no facial weakness, + cough.  Motor: Localizing BUE; BLE withdraw. UTA sensation  Cardiovascular: Heart regular rate and rhythm  Lungs: Clear to ascultation bilaterally   Abdomen: soft, non-tender and bowel sounds present   Extremities: no cyanosis or edema  Skin: Skin warm and dry    Labs:  I have reviewed all lab results.    I/O:  I/O last 24 hours:      Intake/Output Summary (Last 24 hours) at 07/25/2018 0834  Last data filed at 07/25/2018 0600  Gross per 24 hour   Intake 1677 ml   Output 2185 ml   Net -508 ml     I/O current shift:  No  intake/output data recorded.    Drips:   Current Facility-Administered Medications   Medication Dose Frequency Last Rate         Radiology Results:    CT brain 07/11/18: "1. Stable postoperative changes from left decompressive craniectomy with persistent extracalvarial brain herniation. 2.  Persistent cerebral edema and hemorrhagic tumor resection site. No new hemorrhage."    MRI brain w/wo contrast 07/06/18: "acute postoperative changes related to gross total resection of a left medial tentorial meningioma with previously seen mass effect on the left thalamus, left middle cerebral peduncle and left midbrain. The current examination demonstrate hemorrhagic products are identified within the resection cavity extending into the parenchyma of the left thalamus and tracking down within the left midbrain and extending to the fourth Ventricle."    CT venogram 12/28: "IMPRESSION: 1.  Redemonstration of a subocclusive left transverse sinus thrombus. 2.  Otherwise unchanged appearance of postoperative changes from left tentorial meningioma resection and left frontoparietotemporal craniectomy."      Impression and Plan:    Active Hospital Problems    Diagnosis   . 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 Tobacco use disorder   . 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, now controlled   -- vEEG removed, 12/31   -- Current AEDs   -- Keppra 1500 BID   -- Vimpat 200 BID   -- Phenytoin 100 TID   -- Onfi 2045mTID    Pain/sedation  -- Tylenol and Fentanyl PRN (none required overnight)     Left transverse sinus thrombus  -- CT venogram 07/20/18: Confirmation of subocclusive left transverse sinus thrombus  -- Continue aspirin       Cardiovascular:  No acute issues, hypotension resolved   -- SBP goal < 160, MAP > 65, off all pressors   -- PRN hydralazine/labeatolol (none required overnight)     Respiratory:    Acute respiratory failure requiring mechanical ventilation now s/p tracheostomy   -- ATC continuously   -- Maintain saturations > 94 %   -- CXR 12/29 - patchy opacity right base and left lung atelectasis  -- Peridex BID   -- OOBTC as tolerated, must wear helmet when OOB     Renal:  Hyponatremia, mild   -- Daily BMP    K goal > 4, Mg goal > 2  -- Continue daily K replacement  -- Continue foley for skin excoriation, bladder training       Infectious Disease: Concern for left axilla cellulitis   -- Afebrile overnight, no leukocytosis   -- Cultures:    -- Bronch brush 07/21/18: < 100 gram + cocci    -- UA 07/21/18: Clear   -- BC 07/21/18: no growth    -- BAL 12/25: Viridians Streptococcus  -- Ultrasound 07/20/18 of R axilla completed: non-masslike, likely related to cellulitis   -- LP 12/31 appears non-infectious   -- Cefepime and Vancomycin - d/c vancomycin  -- Stop cefepime, switch to Keflex end date 07/28/18    Hematology:  Normocytic anemia   -- CBC daily   -- Continue to monitor and transfuse if Hgb < 7     DVT prophylaxis  -- SCD's / lovenox     LLE DVT  -- Peripheral Venous Duplex 07/16/18: LLE DVT (calf vein)  -- Duplexes repeated 12/31 - chronic left calf DVT, 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 Diff 12/23 - neg  -- Stop banana flakes     Nutrition  -- Osmolite 1.5, 1 can Q4H while awake  -- Vit C  -- Protein packet      Endocrine:  No acute issues  -- SSIP conservative      Integumentary:   Right axilla abscess  -- See ID    Excoriation in inner thigh area  -- Bag balm   -- Nystatin  powder BID      Psychiatric:  No acute issues  -- Monitor for ICU delirium       Prophylaxis:    DVT/PE Prophylaxis: SCDs, lovenox  GI: NA  Mouth: Peridex      Family:  None present at bedside  Disposition/ Baseline:  Transfer to SDS today     Code Status Information     Code Status    Full Code          Grant Ruts, APRN,NP-C  07/25/2018, 06:42    I personally saw and evaluated the patient. See mid-level's note for additional details. My findings/participation are   Patient exam stable to slightly improved with improved localizing  Trach in place with continued ATC  Otherwise Plan as Above in Resident or Midlevel's Note.     Valora Corporal, MD

## 2018-07-26 LAB — ADULT ROUTINE BLOOD CULTURE, SET OF 2 BOTTLES (BACTERIA AND YEAST)
BLOOD CULTURE, ROUTINE: NO GROWTH
BLOOD CULTURE, ROUTINE: NO GROWTH

## 2018-07-26 LAB — POC BLOOD GLUCOSE (RESULTS)
GLUCOSE, POC: 104 mg/dL (ref 70–105)
GLUCOSE, POC: 131 mg/dL — ABNORMAL HIGH (ref 70–105)

## 2018-07-26 MED ADMIN — ascorbic acid (vitamin C) 500 mg tablet: GASTROSTOMY | @ 21:00:00

## 2018-07-26 MED ADMIN — aspirin 325 mg tablet: GASTROSTOMY | @ 09:00:00

## 2018-07-26 MED ADMIN — nystatin 100,000 unit/gram topical powder: TOPICAL | @ 21:00:00

## 2018-07-26 MED ADMIN — cephalexin 250 mg/5 mL oral suspension: GASTROSTOMY | @ 21:00:00

## 2018-07-26 MED ADMIN — phenytoin 100 mg/4 mL oral suspension: GASTROSTOMY | @ 21:00:00

## 2018-07-26 MED ADMIN — ADULT CUSTOM PARENTERAL NUTRITION: GASTROSTOMY | @ 09:00:00

## 2018-07-26 NOTE — Progress Notes (Signed)
[facility]  NEUROSURGERY   PROGRESS NOTE    Haston, [name], 58 y.o. female  Date of Admission:  07/05/2018  Date of Service: 07/27/2018  Date of Birth:  1961/06/21     Chief Complaint: right upper and lower extremity weakness    Subjective:   NAEO    Vital Signs:  Temp (24hrs) Max:37.8 C (962 F)      Systolic (83MOQ), HUT:654 , Min:117 , YTK:354     Diastolic (65KCL), EXN:17, Min:57, Max:83    Temp  Avg: 37.4 C (99.3 F)  Min: 37.1 C (98.8 F)  Max: 37.8 C (100 F)  MAP (Non-Invasive)  Avg: 89.7 mmHG  Min: 75 mmHG  Max: 99 mmHG  Pulse  Avg: 104.3  Min: 99  Max: 109  Resp  Avg: 24.5  Min: 18  Max: 28  SpO2  Avg: 96.1 %  Min: 92 %  Max: 100 %  Pain Score (Numeric, Faces): Other    Min/Max/Avg ICP/CPP last 24hrs:   No data recorded    Today's Physical Exam:    Appears acutely ill, intubated  GCS _0 T  -- Eyes do not open to noxious stimuli  -- Localizes in LUE, Withdraws in RUE and BLE  PERRL  Face symmetric  +cough    Unable to assess hearing  Unable to assess speech  Unable to assess fund of knowledge  Unable to assess attention span & concentration  Unable to assess recent and remote memory  CN _1 EOMI Unable to assess  CN 11 shrug symmetric Unable to assess  Muscle Strength Unable to assess  Unable to assess drift    -- L fronto-temporal incision c/d/i, staples removed  -- JP drain Rapide  -- LD Rapide    Assessment/Plan:  [name] is a 58 yo F PMH HTN, HLD w/ 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 POD22/22  -- FU video EEG   -- Keppra 1500 mg 2x/day   -- Vimpat 200 mg 2x/day   -- Dilantin 100 mg q8h   -- Clobazam 20 mg q8h   -- Will need outpatient neurology follow-up  -- Please bathe patient daily and clean wound daily  -- Okay for soap/shampoo  -- Apply  bacitracin ointment BID, cleaning old ointment off before application of new ointment  -- Final pathology: WHO grade I meningioma  -- FU blood pressure   -- Previously started Carvedilol 12.5 mg 2x/day and Lisinopril 40 mg daily held  -- Imaging:   -- Venous duplex LLE (07/23/18): 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   -- CTV intra w/wo (07/20/18): sub-occlusive left transverse sinus thrombus   -- US soft tissue axilla (07/20/18): nonspecific area of increased echogenicity seen at the site of concern in the right axilla measuring approximately 6.5 x 2.0 x 8.9 cm; appears nonmass-like with hazy borders and is likely related to cellulitis   -- MRI-brain w/wo (07/20/18): possible non-occlusive thrombus in transverse sinus   -- CT-brain wo (07/19/18): interval decrease of hematoma   -- Venous duplex BLE (07/16/18): left peroneal (calf vein) DVT   -- Venous duplex BUE (07/16/18): no evidence of DVT   -- TTE (07/12/18): EF 75%, no regional wall abnormalities   -- CT brain w/o (07/11/18): stable    -- CT brain w/o (07/09/18 @  1328):stable   -- CT brain w/o (07/09/18 @ 0430):stable   -- CT brain w/o (07/08/18):postop changes from decompressive craniectomy with interval mild extracalvarial brain herniation through the craniectomy site; mild increase in cerebral edema at the tumor resection bed and adjacent parenchyma without significant increase in hemorrhage   -- MRI brain w/wo (07/06/18):gross total resection of a left medial tentorial meningioma; hemorrhagic productswithin the resection cavity extending into the parenchyma of the left thalamus and tracking down within the left midbrain and extending to the fourth ventricle   -- CT brain w/o (07/06/18): postop changes from left decompressive craniectomy for evacuation of hematoma of tumor bed with residual hemorrhage and improved edema and rightward midline shift  -- CT brain w/o (07/06/18): postop changes from decompressive  craniectomy for evacuation of tumor bed hematoma with minimal residual blood at the surgical site   -- CT brain w/o (07/05/18): postop changes with development of hematoma at tumor bed and 8 mm of midline shift  -- Pain/spasm control: Tylenol PRN, Fentanyl PRN, Motrin PRN  -- Diet: NPO + Osmolite TF  -- Bowel regimen held, last BM 07/24/18  -- Abx: Cefepime for cellulitis, started 07/21/18 w/ end-date of 07/28/18   -- CSF (12/31): NGTD   -- Bronch Brush (07/21/18): gram positive cocci   -- Blood Cx (07/21/18): negative   -- UA (07/21/18): WNL   -- BAL (07/17/18): 15000 Anginosus group Viridans Streptococcus   -- CDiff (07/15/18): negative   -- Urine Cx (07/16/18): C. albicans  -- Activity: Up in chair with assist TID   -- Craniectomy precautions, helmet to be worn at all times when OOB  -- PT/OT: SNF vs LTAC  -- DVT ppx: Lovenox, SCDs/Venodynes  -- Consults:    NCCU/Neurology   -- Will need outpatient neurology follow-up for AED management   Trauma   -- Trach/PEG completed 07/15/18  -- Lines/Drains: Trach/PEG, Foley catheter  -- Wound: L fronto-temporal incision (staples removed), JP drain Rapide, LD Rapide, helmet  -- Disposition: ongoing    Leta Baptist, MD  PGY-3 Neurosurgery  07/27/2018

## 2018-07-26 NOTE — Code Documentation (Addendum)
ADULT RAPID RESPONSE NOTE    RRT ALERT: Yes    RRT ALERT: Yes Time RRT Arrived: 1250 Source of Request: MEWS: 8+   RRT APP Present: No RT Present: No      MEWS Score     MEWS SCORE (READ ONLY): 8    Primary Team Notification                  Patient's Primary Team Present: No response required     Adult Rapid Response Team Event  RRT Events: No Intervention     Primary Concerns  Primary Concerns: Altered Mental Status         Secondary Concerns            Subject Type: Patient    Inital Assessement: MEWS 8   Assessment: The patient is s/p a craniotomy with tumor resection and is at her baseline mental status per the bedside RN.  Pt appears to be sleeping and in no apparent distress.  VSS and urine output adequate.     Intervention: No intervention required.  Bedside nurse contacted and stated the patient is at her baseline and no assistance is needed at this time.     Adult Rapid Response Team - Time Out     Time RRT Departed: 1259

## 2018-07-26 NOTE — Nurses Notes (Signed)
Pt arrived to 1072 from NCCU  Assessment and vitals to come in flowsheet.

## 2018-07-26 NOTE — Progress Notes (Signed)
[facility]  NEUROSURGERY   PROGRESS NOTE      [name], [name], 58 y.o. female  Date of Admission:  07/05/2018  Date of Service: 07/26/2018  Date of Birth:  [birth year]    Referring Physician:  No ref. provider found    Post Op Day: 11 Days Post-Op S/P Procedure(s) (LRB):  TRACHEOSTOMY (N/A)  GASTROSCOPY WITH PLACEMENT PEG TUBE (N/A)    Chief Complaint: right tentorial meningioma  Subjective: NAEO    Vital Signs:  Temp (24hrs) Max:37.8 C (161 F)      Systolic (09UEA), VWU:981 , Min:117 , XBJ:478     Diastolic (29FAO), ZHY:86, Min:57, Max:83    Temp  Avg: 37.4 C (99.3 F)  Min: 37.1 C (98.8 F)  Max: 37.8 C (100 F)  MAP (Non-Invasive)  Avg: 91.3 mmHG  Min: 75 mmHG  Max: 101 mmHG  Pulse  Avg: 104.9  Min: 99  Max: 113  Resp  Avg: 25.8  Min: 18  Max: 30  SpO2  Avg: 96.8 %  Min: 92 %  Max: 100 %  Pain Score (Numeric, Faces): Other    Min/Max/Avg ICP/CPP last 24hrs:   No data recorded    Today's Physical Exam:    GCS _0 T  -- Eyes do not open to noxious stimuli  -- Localizes in LUE, Withdraws in RUE and BLE  PERRL  Face symmetric  +cough  CN _1 EOMI Unable to assess  CN 11 shrug symmetric Unable to assess  Muscle Strength Unable to assess  Unable to assess drift  -- L fronto-temporal incision c/d/i, staples removed  -- JP drain Rapide  -- LD Rapide    Current Medications:  acetaminophen (TYLENOL) 160 mg per 5 mL liquid - grape flavored, 650 mg, Gastric (NG, OG, PEG, GT), Q4H PRN  artificial tears ophthalmic solution, 1 Drop, Both Eyes, Q2H PRN  ascorbic acid (VITAMIN C) tablet, 500 mg, Gastric (NG, OG, PEG, GT), 2x/day  aspirin tablet 325 mg, 325 mg, Gastric (NG, OG, PEG, GT), Daily  bacitracin 500 units/gram topical ointment tube, , Topical, 2x/day  cephalexin (KEFLEX) 250m per 560moral liquid, 500 mg, Gastric (NG, OG, PEG, GT), 4x/day  chlorhexidine gluconate (PERIDEX) 0.12% mouthwash, 15 mL, Swish & Spit, 2x/day  cloBAZam (ONFI) 2.5 mg per mL oral liquid, 20 mg, Gastric (NG, OG, PEG, GT),  Q8HRS  enoxaparin PF (LOVENOX) 40 mg/0.4 mL SubQ injection, 40 mg, Subcutaneous, Q24H  glycopyrrolate (ROBINUL) 0.2 mg/mL (200 mcg/mL) injection, 200 mcg, Intravenous, Q8H PRN  hydrALAZINE (APRESOLINE) injection 10 mg, 10 mg, Intravenous, Q4H PRN  ibuprofen (MOTRIN) tablet, 400 mg, Gastric (NG, OG, PEG, GT), Q6H PRN  labetalol (TRANDATE) 5 mg/mL injection, 10 mg, Intravenous, Q1H PRN  lacosamide (VIMPAT) tablet, 200 mg, Gastric (NG, OG, PEG, GT), 2x/day  lanolin-oxyquin-pet, hydrophil (BAG BALM) topical ointment, , Apply Topically, 2x/day PRN  levETIRAcetam (KEPPRA) tablet, 1,500 mg, Gastric (NG, OG, PEG, GT), 2x/day  nicotine (NICODERM CQ) transdermal patch (mg/24 hr), 14 mg, Transdermal, Daily  NS flush syringe, 2 mL, Intracatheter, Q8HRS    And  NS flush syringe, 2-6 mL, Intracatheter, Q1 MIN PRN  NS flush syringe, 10-40 mL, Intravenous, Q8HRS  NS flush syringe, 20-30 mL, Intravenous, Q1 MIN PRN  nutrition protein supplement 15 g per 30 mL packet, 1 Packet, Gastric (NG, OG, PEG, GT), Daily  nystatin (MYCOSTATIN) 100,000 units per mL oral liquid, 5 mL, Swish & Spit, 4x/day  nystatin (NYSTOP) 100,000 units/g topical powder, , Apply Topically,  2x/day  ondansetron (ZOFRAN) 2 mg/mL injection, 4 mg, Intravenous, Q6H PRN  perflutren lipid microspheres (DEFINITY) 1.3 mL in NS 10 mL (tot vol) injection, 2 mL, Intravenous, Give in Cardiology  phenytoin (DILANTIN) 100 mg per 4 mL oral liquid, 100 mg, Gastric (NG, OG, PEG, GT), Q8HRS  potassium bicarbonate-citric acid (EFFER-K) effervescent tablet, 40 mEq, Gastric (NG, OG, PEG, GT), Daily with Breakfast  SSIP insulin lispro (HUMALOG) 100 units/mL injection, 0-12 Units, Subcutaneous, Q4H PRN      I/O:  I/O last 24 hours:      Intake/Output Summary (Last 24 hours) at 07/26/2018 1810  Last data filed at 07/26/2018 1700  Gross per 24 hour   Intake 774 ml   Output 2160 ml   Net -1386 ml     I/O current shift:  01/03 0700 - 01/03 1859  In: 240 [GT:240]  Out: 1175  [Urine:1175]    Antibiotics: Date Started Date Completed   1.     2.     3.     4.       Nutrition/Residuals:  MNT PROTOCOL FOR DIETITIAN  ADULT TUBE FEED - BOLUS Q4H WHILE AWAKE NO MEALS, TF ONLY; OSMOLITE 1.5; NG; BOLUS; Bolus Amount: 1 can 5 times daily  DIET NPO - NOW EXCEPT TUBE FEEDS    Labs  Please indicate ordered or reviewed)  Reviewed:   Lab Results Today:    Results for orders placed or performed during the hospital encounter of 07/05/18 (from the past 24 hour(s))   BASIC METABOLIC PANEL   Result Value Ref Range    SODIUM 136 136 - 145 mmol/L    POTASSIUM 4.4 3.5 - 5.1 mmol/L    CHLORIDE 102 96 - 111 mmol/L    CO2 TOTAL 27 22 - 32 mmol/L    ANION GAP 7 4 - 13 mmol/L    CALCIUM 9.2 8.5 - 10.2 mg/dL    GLUCOSE 153 (H) 65 - 139 mg/dL    BUN 13 8 - 25 mg/dL    CREATININE 0.57 0.49 - 1.10 mg/dL    BUN/CREA RATIO 23 (H) 6 - 22    ESTIMATED GFR >60 >60 mL/min/1.56m2   MAGNESIUM   Result Value Ref Range    MAGNESIUM 1.9 1.6 - 2.6 mg/dL   PHOSPHORUS   Result Value Ref Range    PHOSPHORUS 3.3 2.4 - 4.7 mg/dL   CBC WITH DIFF   Result Value Ref Range    WBC 8.9 3.7 - 11.0 x10^3/uL    RBC 3.08 (L) 3.85 - 5.22 x10^6/uL    HGB 8.7 (L) 11.5 - 16.0 g/dL    HCT 27.9 (L) 34.8 - 46.0 %    MCV 90.6 78.0 - 100.0 fL    MCH 28.2 26.0 - 32.0 pg    MCHC 31.2 31.0 - 35.5 g/dL    RDW-CV 15.1 11.5 - 15.5 %    PLATELETS 484 (H) 150 - 400 x10^3/uL    MPV 9.0 8.7 - 12.5 fL    NEUTROPHIL % 74 %    LYMPHOCYTE % 14 %    MONOCYTE % 7 %    EOSINOPHIL % 3 %    BASOPHIL % 1 %    NEUTROPHIL # 6.56 1.50 - 7.70 x10^3/uL    LYMPHOCYTE # 1.24 1.00 - 4.80 x10^3/uL    MONOCYTE # 0.63 0.20 - 1.10 x10^3/uL    EOSINOPHIL # 0.29 <=0.50 x10^3/uL    BASOPHIL # <0.10 <=0.20 x10^3/uL    IMMATURE GRANULOCYTE %  1 0 - 1 %    IMMATURE GRANULOCYTE # 0.12 (H) <0.10 x10^3/uL   POC BLOOD GLUCOSE (RESULTS)   Result Value Ref Range    GLUCOSE, POC 104 70 - 105 mg/dl       Assessment/ Plan:  Active Hospital Problems    Diagnosis   . 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 Tobacco use disorder   . 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 POD21/21.  -- vEEG removed on 12/31                -- Keppra1500 mg 2x/day                -- Vimpat 200 mg 2x/day                -- Dilantin 100 mg q8h                -- Clobazam 20 mg q8h                -- Will need outpatient neurology follow-up  -- Please bathe patient daily and clean wound daily  -- Okay for soap/shampoo  -- Apply bacitracin ointment BID, cleaning old ointment off before application of new ointment  -- Final pathology: WHO grade I meningioma  -- FU blood pressure                -- Previously started Carvedilol 12.5 mg 2x/day and Lisinopril 40 mg daily held  -- IR LP completed previously                -- CSF protein: 31                -- CSF gluclose: 65                -- CSF culture (07/23/18): NGTD   -- Imaging:                -- Venous duplex LLE (07/23/18): 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                -- CTV intra w/wo (07/20/18): sub-occlusive left transverse sinus thrombus                -- US soft tissue axilla (07/20/18): nonspecific area of increased echogenicity seen at the site of concern in the right axilla measuring approximately 6.5 x 2.0 x 8.9 cm; appears nonmass-like with hazy borders and is likely related to cellulitis                -- MRI-brain w/wo (07/20/18): possible non-occlusive thrombus in transverse sinus                -- CT-brain wo (07/19/18): interval decrease of hematoma                -- Venous duplex BLE (07/16/18): left peroneal (calf vein) DVT                -- Venous duplex BUE (07/16/18): no evidence of  DVT                -- TTE (07/12/18): EF  75%, no regional wall abnormalities                -- CT brain w/o (07/11/18):stable                 -- CT brain w/o (07/09/18 @ 1328):stable                -- CT brain w/o (07/09/18 @ 0430):stable                -- CT brain w/o (07/08/18):postop changes from decompressive craniectomy with interval mild extracalvarial brain herniation through the craniectomy site; mild increase in cerebral edema at the tumor resection bed and adjacent parenchyma without significant increase in hemorrhage                -- MRI brain w/wo (07/06/18):gross total resection of a left medial tentorial meningioma; hemorrhagic productswithin the resection cavity extending into the parenchyma of the left thalamus and tracking down within the left midbrain and extending to the fourth ventricle                -- CT brain w/o (07/06/18): postop changes from left decompressive craniectomy for evacuation of hematoma of tumor bed with residual hemorrhage and improved edema and rightward midline shift  -- CT brain w/o (07/06/18): postop changes from decompressive craniectomy for evacuation of tumor bed hematoma with minimal residual blood at the surgical site                -- CT brain w/o (07/05/18): postop changes with development of hematoma at tumor bed and 8 mm of midline shift  -- Pain/spasm control: Tylenol PRN, Fentanyl PRN, Motrin PRN  -- Diet: NPO + Osmolite TF  -- Bowel regimen held, last BM 07/24/18  -- Abx: Cefepime for cellulitis, started 07/21/18 w/ end-date of 07/28/18                -- Bronch Brush (07/21/18): 1+ PMN, NGTD                -- Blood Cx (07/21/18): in process                -- UA (07/21/18): WNL                -- BAL (07/17/18): 15000 Anginosus group Viridans Streptococcus                -- CDiff (07/15/18): negative                -- Urine Cx (07/16/18): C. albicans  -- Activity: Up in chair with assist TID                -- Craniectomy precautions, helmet to be  worn at all times when OOB  -- PT/OT: SNF vs LTAC  -- DVT ppx: Lovenox, SCDs/Venodynes  -- Consults:                 NCCU/Neurology                -- Will need outpatient neurology follow-up for AED management                Trauma                -- Trach/PEG completed 07/15/18  -- Lines/Drains: Trach/PEG, R IJ, Foley catheter, cooling blanket  -- Wound: L fronto-temporal incision (staples removed), JP drain Rapide, LD Rapide, helmet  -- Disposition: ongoing  Hipolito Bayley, MD PhD      I saw and examined the patient.  I reviewed the resident's note.  I agree with the findings and plan of care as documented in the resident's note.  Any exceptions/additions are edited/noted.    [name], MD

## 2018-07-26 NOTE — Care Management Notes (Signed)
[facility]  Care Management Note    Patient Name: [name]  Date of Birth: 09/20/1960  Sex: female  Date/Time of Admission: 07/05/2018  7:26 AM  Room/Bed: 1072/A  Payor: HEALTH PLAN / Plan: HEALTH PLAN (PPO-NON ST EMP) / Product Type: PPO /    LOS: 21 days   Primary Care Providers:  Isac Caddy, MD, MD (General)    Admitting Diagnosis:  Meningioma (CMS [facility]) [D32.9]    Assessment:      07/26/18 0903   Patient Hand-Off   Clinical/Discharge Plan of Care Information Communicated to:  Clinical Care Coordinator   Comments Hand off given to Mikle Bosworth, RN [facility] 10E       Discharge Plan:  LTAC (code 90)  Hand off given to Mikle Bosworth, RN [place] 10E.    The patient will continue to be evaluated for developing discharge needs.     Case Manager: Claudius Sis, [place] COORDINATOR  Phone: [phone]

## 2018-07-26 NOTE — Care Plan (Signed)
Pt admitted to NCCU for tumor resection on 12/13.  Patient now SDS.  GCS 1-5-1.  Q2 turns.  Q2 Neuro checks.  Crani education provided.  Fall, skin, safety precautions maintained.  Frequent suctioning.

## 2018-07-26 NOTE — Care Plan (Signed)
[place]  Occupational Therapy Progress Note    Patient Name: [name]  Date of Birth: 17-Sep-1960  Height:  167.6 cm (5' 5.98")  Weight:  87.6 kg (193 lb 2 oz)  Room/Bed: 1072/A  Payor: HEALTH PLAN / Plan: HEALTH PLAN (PPO-NON ST EMP) / Product Type: PPO /     Assessment:      Ms. Buist tolerated OT treatment session poorly this date. Patient with no active eye opening, no visual tracking with physical assist to open eyelid. Patient tolerated PROM to all extremities and repositioning in bed with DEP A x 1-2. Patient continues to be appropriate for skilled OT in LTACH vs. SNF upon discharge.      Discharge Needs:     Equipment Recommendation: to be determined    Discharge Disposition:  skilled nursing facility, long term acute care facility   The above recommendation is based upon the current examination and evaluation performed on this date. As subsequent sessions are completed, recommendations will be updated accordingly.    JUSTIFICATION OF DISCHARGE RECOMMENDATION   Based on current diagnosis, functional performance prior to admission, and current functional performance, this patient requires continued OT services in skilled nursing facility, long term acute care facility in order to achieve significant functional improvements.    Plan:   Continue to follow patient according to established plan of care.  The risks/benefits of therapy have been discussed with the patient/caregiver and he/she is in agreement with the established plan of care.     Subjective & Objective:        07/25/18 1252   Therapist Pager   OT Assigned/ Pager # Gearlean Alf [phone]   Rehab Session   Document Type therapy progress note (daily note)   Total OT Minutes: 15   Patient Effort poor   Symptoms Noted During/After Treatment fatigue   General Information   Patient Profile Reviewed? yes   General Observations of Patient Ms. [name] was lethargic, supine in bed with HOB elevated upon arrival in  patient room. RN approved session. Patient seen with professional assist of PT for clustered care.   Medical Lines PEG Tube;PIV Line;Telemetry   Respiratory Status aerosol trach collar   Existing Precautions/Restrictions aspiration precautions;fall precautions;full code   Pre Treatment Status   Pre Treatment Patient Status Patient supine in bed;Call light within reach;Nurse approved session   Support Present Pre Treatment  None   Communication Pre Treatment  Nurse   Mutuality/Individual Preferences   Individualized Care Needs OOB via Maxi Move; encourage ROM of all extremities   Self-Care   Current Activity Tolerance poor   Vital Signs   Pre-Treatment Heart Rate (beats/min) 99   Pre-Treatment Resp Rate (breaths/min) 23   Pre Treatment BP 134/66   Pre SpO2 (%) 98   O2 Delivery Pre Treatment supplemental O2   Vitals Comment Vitals stable during session.   Pain Assessment   Pre/Post Treatment Pain Comment Patient with grimacing with stretching of L hemibody and with tactile stimulus applied to BUEs.   Coping/Psychosocial   Observed Emotional State calm;flat   Coping/Psychosocial Response Interventions   Plan Of Care Reviewed With patient   Supportive Measures active listening utilized;goal setting facilitated;positive reinforcement provided   Trust Relationship/Rapport care explained;reassurance provided   Cognitive Assessment/Interventions   Behavior/Mood Observations unable to arouse   Orientation Status unable/difficult to assess   Attention severe impairment   Follows Commands does not follow one step commands   RUE Assessment   RUE Assessment  X- Exceptions   RUE ROM PROM WNL   RUE Strength no active movement   LUE Assessment   LUE Assessment X-Exceptions   LUE ROM PROM WNL   LUE Strength no active movement   RLE Assessment   RLE Assessment X-Exceptions   RLE ROM PROM WNL   RLE Strength no active movement   LLE Assessment   LLE Assessment X-Exceptions   LLE ROM PROM WNL   LLE Strength no active movement   Mobility  Assessment/Training   Additional Documentation Bed Mobility Assessment/Treatment (Group)   Mobility Comment Formal mobility not appropriate to attempt at this time.   Bed Mobility Assessment/Treatment   Bed Mobility, Assistive Device draw sheet   Safety Issues cognitive deficits limit understanding;decreased use of arms for pushing/pulling;decreased use of legs for bridging/pushing;impaired trunk control for bed mobility   Impairments balance impaired;cognition impaired;coordination impaired;endurance;motor control impaired;muscle tone abnormal;postural control impaired;ROM decreased;sensory feedback impaired;strength decreased   Comment Patient was repositioned in bed following ROM of all extremities.   Roll Left Independence dependent (less than 25% patient effort)   Roll Right Independence dependent (less than 25% patient effort)   Scoot/Bridge Independence dependent (less than 25% patient effort);2 person assist required   ADL Assessment/Intervention   ADL Comments Patient requires DEP A for all ADLs at this time.   IADL Evaluation   IADL Comments Anticipate total assistance required for IADL tasks at this time.   Motor Skills/Interventions   Additional Documentation Therapeutic Exercise (Group)   Therapeutic Exercise/Activity   Comment PROM completed to RUE and RLE at all joints, in all planes. Patient tolerated PROM and stretching well with no indication of pain. PROM completed to prevent joint contractures and maintain skin integrity.   Post Treatment Status   Post Treatment Patient Status Patient supine in bed;Call light within reach;Venodynes in place and activated   Support Present Post Treatment  None   Occupational Therapy Clinical Impression   Functional Level at Time of Session [name] tolerated OT treatment session poorly this date. Patient with no active eye opening, no visual tracking with physical assist to open eyelid. Patient tolerated PROM to all extremities and repositioning in bed with  DEP A x 1-2. Patient continues to be appropriate for skilled OT in LTACH vs. SNF upon discharge.   Anticipated Equipment Needs at Discharge to be determined   Anticipated Discharge Disposition skilled nursing facility;long term acute care facility       Therapist:   Darcus Pester, OT  Pager #: [phone]

## 2018-07-27 LAB — POC BLOOD GLUCOSE (RESULTS)
GLUCOSE, POC: 111 mg/dL — ABNORMAL HIGH (ref 70–105)
GLUCOSE, POC: 105 mg/dL (ref 70–105)
GLUCOSE, POC: 98 mg/dL (ref 70–105)
GLUCOSE, POC: 107 mg/dL — ABNORMAL HIGH (ref 70–105)
GLUCOSE, POC: 138 mg/dL — ABNORMAL HIGH (ref 70–105)
GLUCOSE, POC: 103 mg/dL (ref 70–105)

## 2018-07-27 MED ADMIN — sodium chloride 0.9 % (flush) injection syringe: GASTROSTOMY | @ 09:00:00

## 2018-07-27 MED ADMIN — potassium bicarbonate-citric acid 20 mEq effervescent tablet: GASTROSTOMY | @ 09:00:00

## 2018-07-27 MED ADMIN — phenytoin 100 mg/4 mL oral suspension: GASTROSTOMY | @ 13:00:00

## 2018-07-27 MED ADMIN — ascorbic acid (vitamin C) 500 mg tablet: GASTROSTOMY | @ 21:00:00

## 2018-07-27 MED ADMIN — sodium chloride 0.9 % (flush) injection syringe: @ 13:00:00

## 2018-07-27 MED ADMIN — nystatin 100,000 unit/mL oral suspension: ORAL | @ 13:00:00

## 2018-07-27 MED ADMIN — phenytoin 100 mg/4 mL oral suspension: GASTROSTOMY | @ 06:00:00

## 2018-07-27 MED ADMIN — lactated Ringers intravenous solution: INTRAVENOUS | @ 22:00:00 | NDC 00338011704

## 2018-07-27 MED ADMIN — sodium chloride 0.9 % intravenous solution: GASTROSTOMY | @ 21:00:00 | NDC 00338004904

## 2018-07-27 MED ADMIN — FLUoxetine 20 mg capsule: GASTROSTOMY | @ 21:00:00

## 2018-07-27 MED ADMIN — sodium chloride 0.9 % intravenous solution: TOPICAL | @ 23:00:00 | NDC 00338004904

## 2018-07-27 MED ADMIN — sodium chloride 0.9 % (flush) injection syringe: INTRAVENOUS | @ 14:00:00

## 2018-07-27 NOTE — Nurses Notes (Signed)
Received call from telemetry patent had a 5 sec burst of SVT. Paged service spoke with Reola Calkins MD. He stated watch her and let them know if it happens again.

## 2018-07-27 NOTE — Nurses Notes (Signed)
Foley d/c as ordered. Had 200 ml yellow urine.

## 2018-07-27 NOTE — Nurses Notes (Signed)
Patient transferred to room 1010 via bed. Family aware of transfer.left in stable condition with all belongings.

## 2018-07-27 NOTE — Nurses Notes (Signed)
Patient to transfer to room 1010. Family aware of transfer and agreeable. Report called to [place]. Room is not ready for transfer.

## 2018-07-27 NOTE — Nurses Notes (Signed)
Patient had temp 38.2 tylenol given as ordered.

## 2018-07-27 NOTE — Code Documentation (Signed)
ADULT RAPID RESPONSE NOTE    RRT ALERT: Yes    RRT ALERT: Yes Time RRT Arrived: 1158 Source of Request: MEWS: 8+   RRT APP Present: No RT Present: Yes      MEWS Score     MEWS SCORE (READ ONLY): 6    Primary Team Notification                  Patient's Primary Team Present: No response required     Adult Rapid Response Team Event  RRT Events: Documentation Update     Primary Concerns  Primary Concerns: Altered Mental Status         Secondary Concerns  Secondary Concerns: Urine Output         Subject Type: Patient    Inital Assessement: MEWS=8 Resp 2 Pulse 1 LOC 3 urine 2   Assessment: eyes closed. vital signs noted. family at bedside   Intervention: urine quantified and MEWS decreased to 6     Adult Rapid Response Team - Time Out     Time RRT Departed: 1211       Rapid Response for MEWS 8. Urine output quantified and MEWS decreased to 6.

## 2018-07-27 NOTE — Care Plan (Signed)
Problem: Adult Inpatient Plan of Care  Goal: Plan of Care Review  Outcome: Ongoing (see interventions/notes)  Goal: Patient-Specific Goal (Individualization)  Outcome: Ongoing (see interventions/notes)  Flowsheets (Taken 07/27/2018 0451)  Individualized Care Needs: Seems to be more at ease after suctioning and having head elevated.  Anxieties, Fears or Concerns: Patient's daughters very concerned patient is such a high level of care that she is not ready to be discharged to another facility.  Patient-Specific Goals (Include Timeframe): To remain afebrile and stable enough to transfer to SNF vs. LTACH.  Goal: Absence of Hospital-Acquired Illness or Injury  Outcome: Ongoing (see interventions/notes)  Goal: Optimal Comfort and Wellbeing  Outcome: Ongoing (see interventions/notes)  Goal: Rounds/Family Conference  Outcome: Ongoing (see interventions/notes)     Problem: Cerebral Tissue Perfusion Risk (Craniotomy/Craniectomy/Cranioplasty)  Goal: Effective Cerebral Tissue Perfusion  Outcome: Ongoing (see interventions/notes)     Problem: Seizure, Active Management  Goal: Absence of Seizure/Seizure-Related Injury  Outcome: Ongoing (see interventions/notes)     Problem: Infection (Craniotomy/Craniectomy/Cranioplasty)  Goal: Absence of Infection Signs/Symptoms  Outcome: Ongoing (see interventions/notes)     Problem: Bowel Elimination Impaired (Surgery Nonspecified)  Goal: Effective Bowel Elimination  Outcome: Ongoing (see interventions/notes)         Unable to assess patient's orientation. VSS. Pupils remain 3 and sluggish. Withdrawing x3, except LUE localizes pain. Lungs clear/diminished with ronchi at times; suctioning provided - thick clear, white, tan secretions. Lurline Idol remains in place, site cleaned and dressing changed as needed. PEG tube remains patent without complications. 2000 and 0000 tube feedings given. No BM thus far. Foley remains intact, draining clear, yellow urine. SCD to RLE only. Sitter select in place.  Awaiting placement for SNF vs. LTACH. Family voiced concern for patient's condition and whether she should be transferred. Service spoke with family regarding issue.    Patient Goals Based on Modifiable Risk Factors    The following modifiable risk factors were discussed with patient:     Hypercholesteremia    Goal(s):  Triglycerides less than 150    Patient's selected lifestyle modification(s):  Take lipid lowering medications as prescribed  Follow a low fat/low cholesterol diet    Patient/Family/Caregiver response to plan:    Family stated "We will be sure she receives the nutrients she needs".        [facility] Highest Level of Mobility Goal      Date: 07/27/18     JH-HLM Goal: 2    Exercise Level: 2- Turned self in bed/ROM (active or passive)    Goal Outcome: Goal Achieved

## 2018-07-27 NOTE — Respiratory Therapy (Signed)
Pt found on ATC hooked up to the tank with no oxygen running. Pt is stable and hooked up with no issues. Rt to closely monitor.

## 2018-07-27 NOTE — Progress Notes (Signed)
[facility]  NEUROSURGERY   PROGRESS NOTE    [name], [name], 58 y.o. female  Date of Admission:  07/05/2018  Date of Service: 07/28/2018  Date of Birth:  [birth year]     Chief Complaint: right upper and lower extremity weakness    Subjective: Neurologic exam remains stable.    Vital Signs:  Temp (24hrs) Max:38.2 C (630.1 F)      Systolic (60FUX), NAT:557 , Min:116 , DUK:025     Diastolic (42HCW), CBJ:62, Min:73, Max:87    Temp  Avg: 37.3 C (99.2 F)  Min: 36.5 C (97.7 F)  Max: 38.2 C (100.8 F)  MAP (Non-Invasive)  Avg: 95.6 mmHG  Min: 87 mmHG  Max: 108 mmHG  Pulse  Avg: 102.3  Min: 60  Max: 117  Resp  Avg: 18.5  Min: 16  Max: 21  SpO2  Avg: 95 %  Min: 91 %  Max: 98 %  Pain Score (Numeric, Faces): Other    Min/Max/Avg ICP/CPP last 24hrs:   No data recorded    Today's Physical Exam:    Appears acutely ill, intubated  GCS _0 T  -- Eyes do not open to noxious stimuli  -- Localizes in LUE w/ vigorous stimulation, Withdraws in RUE and BLE  PERRL  Face symmetric  +cough    Unable to assess hearing  Unable to assess speech  Unable to assess fund of knowledge  Unable to assess attention span & concentration  Unable to assess recent and remote memory  CN _1 EOMI Unable to assess  CN 11 shrug symmetric Unable to assess  Muscle Strength Unable to assess  Unable to assess drift    -- L fronto-temporal incision c/d/i, staples removed  -- JP drain Rapide  -- LD Rapide    Assessment/Plan:  [name] is a 58 yo F PMH HTN, HLD w/ 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 POD23/23.  -- vEEG completed previously; no electrographic seizures or clinical spells w/ left hemispheric sharp waves   -- Keppra 1500 mg 2x/day   -- Vimpat 200 mg 2x/day   -- Dilantin 100 mg q8h   -- Clobazam 20 mg q8h   -- Will need  outpatient neurology follow-up  -- Please bathe patient daily and clean wound daily  -- Okay for soap/shampoo  -- Apply bacitracin ointment BID, cleaning old ointment off before application of new ointment  -- Final pathology: WHO grade I meningioma  -- FU blood pressure   -- Previously started Carvedilol 12.5 mg 2x/day and Lisinopril 40 mg daily held    -- Imaging:   -- Venous duplex LLE (07/23/18): 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   -- CTV intra w/wo (07/20/18): sub-occlusive left transverse sinus thrombus   -- US soft tissue axilla (07/20/18): nonspecific area of increased echogenicity seen at the site of concern in the right axilla measuring approximately 6.5 x 2.0 x 8.9 cm; appears nonmass-like with hazy borders and is likely related to cellulitis   -- MRI-brain w/wo (07/20/18): possible non-occlusive thrombus in transverse sinus   -- CT-brain wo (07/19/18): interval decrease of hematoma   -- Venous duplex BLE (07/16/18): left peroneal (calf vein) DVT   -- Venous duplex BUE (07/16/18): no evidence of DVT   -- TTE (07/12/18): EF 75%, no regional wall abnormalities   --  CT brain w/o (07/11/18): stable    -- CT brain w/o (07/09/18 @ 1328):stable   -- CT brain w/o (07/09/18 @ 0430):stable   -- CT brain w/o (07/08/18):postop changes from decompressive craniectomy with interval mild extracalvarial brain herniation through the craniectomy site; mild increase in cerebral edema at the tumor resection bed and adjacent parenchyma without significant increase in hemorrhage   -- MRI brain w/wo (07/06/18):gross total resection of a left medial tentorial meningioma; hemorrhagic productswithin the resection cavity extending into the parenchyma of the left thalamus and tracking down within the left midbrain and extending to the fourth ventricle   -- CT brain w/o (07/06/18): postop changes from left decompressive craniectomy for evacuation of hematoma of tumor bed with residual  hemorrhage and improved edema and rightward midline shift  -- CT brain w/o (07/06/18): postop changes from decompressive craniectomy for evacuation of tumor bed hematoma with minimal residual blood at the surgical site   -- CT brain w/o (07/05/18): postop changes with development of hematoma at tumor bed and 8 mm of midline shift  -- Pain/spasm control: Tylenol PRN, Motrin PRN  -- Diet: NPO + Osmolite TF  -- Bowel regimen held, last BM 07/24/18  -- Abx: Cefepime for cellulitis, started 07/21/18 w/ end-date of 07/28/18   -- CSF (12/31): NGTD   -- Bronch Brush (07/21/18): gram positive cocci   -- Blood Cx (07/21/18): negative   -- UA (07/21/18): WNL   -- BAL (07/17/18): 15000 Anginosus group Viridans Streptococcus   -- CDiff (07/15/18): negative   -- Urine Cx (07/16/18): C. albicans  -- Activity: Up in chair with assist TID   -- Craniectomy precautions, helmet to be worn at all times when OOB  -- PT/OT: SNF vs LTAC  -- DVT ppx: Lovenox, SCDs/Venodynes  -- Consults:    NCCU/Neurology   -- Will need outpatient neurology follow-up for AED management   Trauma   -- Trach/PEG completed 07/15/18  -- Lines/Drains: Trach/PEG   -- Foley removed on 07/27/17  -- Wound: L fronto-temporal incision (staples removed), JP drain Rapide, LD Rapide, helmet  -- Disposition: ongoing    Reola Calkins, MD, PhD  Department of Neurosurgery  PGY5

## 2018-07-27 NOTE — Nurses Notes (Signed)
MEWS score showed 7. Pt able to void and has been incontinent. GCS still E1 M4 V1. Will continue to monitor.

## 2018-07-27 NOTE — Nurses Notes (Addendum)
pt's foley out at 1830. Bladder scan done noted 306 ml urine. Text paged service.    2150: spoke with service, said to straight cath at  500 ml    2307: pt able to void.

## 2018-07-27 NOTE — Nurses Notes (Addendum)
Pt is E1M4V1. Has scheduled clobazam tonight. Do you still want me to give the med? Text paged service.     Spoke with service, said to still give the medicine

## 2018-07-28 ENCOUNTER — Inpatient Hospital Stay (HOSPITAL_COMMUNITY): Payer: PPO

## 2018-07-28 DIAGNOSIS — K56699 Other intestinal obstruction unspecified as to partial versus complete obstruction: Secondary | ICD-10-CM

## 2018-07-28 DIAGNOSIS — R140 Abdominal distension (gaseous): Secondary | ICD-10-CM

## 2018-07-28 LAB — CSF CULTURE WITH GRAM STAIN
CSF CULTURE: NO GROWTH
GRAM STAIN: NONE SEEN

## 2018-07-28 LAB — POC BLOOD GLUCOSE (RESULTS)
GLUCOSE, POC: 84 mg/dL (ref 70–105)
GLUCOSE, POC: 102 mg/dL (ref 70–105)
GLUCOSE, POC: 175 mg/dL — ABNORMAL HIGH (ref 70–105)

## 2018-07-28 MED ORDER — SODIUM PHOSPHATES 19 GRAM-7 GRAM/118 ML ENEMA
133.0000 mL | ENEMA | RECTAL | Status: AC
Start: 2018-07-28 — End: 2018-07-28
  Administered 2018-07-28: 133 mL via RECTAL

## 2018-07-28 MED ORDER — LISINOPRIL 10 MG TABLET
40.0000 mg | ORAL_TABLET | Freq: Every day | ORAL | Status: DC
Start: 2018-07-28 — End: 2018-08-08
  Administered 2018-07-28: 40 mg via GASTROSTOMY
  Administered 2018-07-29: 0 mg via GASTROSTOMY
  Administered 2018-07-30: 40 mg via GASTROSTOMY
  Administered 2018-07-31: 0 mg via GASTROSTOMY
  Administered 2018-08-01 – 2018-08-07 (×7): 40 mg via GASTROSTOMY
  Filled 2018-07-28: qty 4
  Filled 2018-07-28: qty 3
  Filled 2018-07-28 (×4): qty 4
  Filled 2018-07-28: qty 1
  Filled 2018-07-28 (×4): qty 4

## 2018-07-28 MED ORDER — MAGNESIUM CITRATE ORAL SOLUTION
296.00 mL | Freq: Every day | ORAL | Status: DC | PRN
Start: 2018-07-28 — End: 2018-07-30
  Administered 2018-07-29: 296 mL via GASTROSTOMY
  Filled 2018-07-28 (×2): qty 296

## 2018-07-28 MED ORDER — POLYETHYLENE GLYCOL 3350 17 GRAM ORAL POWDER PACKET
17.0000 g | Freq: Every day | ORAL | Status: DC
Start: 2018-07-28 — End: 2018-08-08
  Administered 2018-07-28 – 2018-07-30 (×3): 17 g via GASTROSTOMY
  Administered 2018-07-31: 0 g via GASTROSTOMY
  Administered 2018-08-01: 17 g via GASTROSTOMY
  Administered 2018-08-02 – 2018-08-07 (×6): 0 g via GASTROSTOMY
  Filled 2018-07-28 (×5): qty 1

## 2018-07-28 MED ADMIN — sodium phosphates 19 gram-7 gram/118 mL enema: RECTAL | @ 23:00:00

## 2018-07-28 MED ADMIN — lacosamide 100 mg tablet: GASTROSTOMY | @ 11:00:00

## 2018-07-28 MED ADMIN — nystatin 100,000 unit/mL oral suspension: ORAL | @ 18:00:00

## 2018-07-28 MED ADMIN — amino ac-protein hydro-whey protein 10 gram-100 kcal/30 mL oral liquid: GASTROSTOMY | @ 09:00:00

## 2018-07-28 MED ADMIN — glycopyrrolate 0.2 mg/mL injection solution: INTRAVENOUS | @ 13:00:00

## 2018-07-28 MED ADMIN — nystatin 100,000 unit/gram topical powder: TOPICAL | @ 11:00:00

## 2018-07-28 MED ADMIN — polyvinyl alcohol 1.4 % eye drops: OPHTHALMIC | @ 16:00:00

## 2018-07-28 MED ADMIN — aspirin 325 mg tablet: GASTROSTOMY | @ 09:00:00

## 2018-07-28 MED ADMIN — sodium chloride 0.9 % (flush) injection syringe: GASTROSTOMY | @ 08:00:00

## 2018-07-28 MED ADMIN — pantoprazole 40 mg tablet,delayed release: ORAL | @ 13:00:00

## 2018-07-28 MED ADMIN — potassium chloride 20 mEq/L in D5-0.9 % sodium chloride intravenous: INTRAVENOUS | @ 14:00:00

## 2018-07-28 MED ADMIN — ipratropium 0.5 mg-albuteroL 3 mg (2.5 mg base)/3 mL nebulization soln: ORAL | @ 14:00:00

## 2018-07-28 MED ADMIN — potassium chloride 20 mEq/L in 0.9 % sodium chloride intravenous: TRANSDERMAL | @ 09:00:00 | NDC 00338069104

## 2018-07-28 NOTE — Nurses Notes (Signed)
Patient's daughter/MPOA, Bubba Hales, called from home for update on patient's status. Breanna expressed concern that patient is more lerthargic ( has not been withdrawing to painful stimuli) and requested that repeat head CT be done.   Text page sent to Neurosurgery regarding above as well as bite block (recommended by RT to keep patient from biting her tongue).  2395- Dr Purcell Nails returned page. States will not order head CT and requested RT be called to order bite block.

## 2018-07-28 NOTE — Nurses Notes (Addendum)
Patient has Effer K ordered gastric route. Paged Neurosurgery to inquire if this is appropriate to be given via Peg tube.

## 2018-07-28 NOTE — Nurses Notes (Signed)
Patient's lungs sound course. O2 sats 94%, respiratory rate 28. ET suctioned patient for a copious amount of tan/yellow drainage that appears to be tube feed. Orally suctioned patient for a copious amount of yellow drainage that appears to be sputum. Percussion and vibration initiated via bed. Paged Neurosurgery to update.  1250- Dr Purcell Nails returned page and updated. No orders received. Per Dr Purcell Nails, service is aware that patient has had large amounts of secretions and required frequent suctioning.

## 2018-07-28 NOTE — Progress Notes (Signed)
[facility]  NEUROSURGERY   PROGRESS NOTE    [name], [name], 58 y.o. female  Date of Admission:  07/05/2018  Date of Service: 07/29/2018  Date of Birth:  Jul 26, 1960     Chief Complaint: right upper and lower extremity weakness    Subjective:   NAEO. Brownish substance in PEG. Will call trauma to evaluate. Enema given for large stool burden.    Vital Signs:  Temp (24hrs) Max:37.2 C (99 F)      Systolic (85OYD), XAJ:287 , Min:88 , OMV:672     Diastolic (09OBS), JGG:83, Min:42, Max:83    Temp  Avg: 37.2 C (98.9 F)  Min: 37 C (98.6 F)  Max: 37.2 C (99 F)  MAP (Non-Invasive)  Avg: 84.6 mmHG  Min: 57 mmHG  Max: 100 mmHG  Pulse  Avg: 102  Min: 83  Max: 109  Resp  Avg: 18.3  Min: 16  Max: 26  SpO2  Avg: 95.4 %  Min: 94 %  Max: 97 %  Pain Score (Numeric, Faces): Other    Min/Max/Avg ICP/CPP last 24hrs:   No data recorded    Today's Physical Exam:    Appears acutely ill, intubated  GCS _0 T  -- Eyes do not open to noxious stimuli  -- Localizes in LUE w/ vigorous stimulation, Withdraws in RUE and BLE  PERRL  Face symmetric  +cough    Unable to assess hearing  Unable to assess speech  Unable to assess fund of knowledge  Unable to assess attention span & concentration  Unable to assess recent and remote memory  CN _1 EOMI Unable to assess  CN 11 shrug symmetric Unable to assess  Muscle Strength Unable to assess  Unable to assess drift    -- L fronto-temporal incision c/d/i, staples removed  -- JP drain Rapide  -- LD Rapide    Assessment/Plan:  [name] is a 58 yo F PMH HTN, HLD w/ 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 POD24/24  -- vEEG completed previously; no electrographic seizures or clinical spells w/ left hemispheric sharp waves   -- Keppra 1500 mg 2x/day   -- Vimpat 200 mg 2x/day   --  Dilantin 100 mg q8h   -- Clobazam 20 mg q8h   -- Will need outpatient neurology follow-up  -- Please bathe patient daily and clean wound daily  -- Okay for soap/shampoo  -- Apply bacitracin ointment BID, cleaning old ointment off before application of new ointment  -- Final pathology: WHO grade I meningioma  -- FU blood pressure   -- Restarted Lisinopril 75m daily   -- Previously started Carvedilol 12.5 mg 2x/day: Held    -- Imaging:   -- Venous duplex LLE (07/23/18): 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   -- CTV intra w/wo (07/20/18): sub-occlusive left transverse sinus thrombus   -- UKoreasoft tissue axilla (07/20/18): nonspecific area of increased echogenicity seen at the site of concern in the right axilla measuring approximately 6.5 x 2.0 x 8.9 cm; appears nonmass-like with hazy borders and is likely related to cellulitis   -- MRI-brain w/wo (07/20/18): possible non-occlusive thrombus in transverse sinus   -- CT-brain wo (07/19/18): interval decrease of hematoma   -- Venous duplex BLE (07/16/18): left peroneal (calf vein) DVT   -- Venous duplex BUE (07/16/18):  no evidence of DVT   -- TTE (07/12/18): EF 75%, no regional wall abnormalities   -- CT brain w/o (07/11/18): stable    -- CT brain w/o (07/09/18 @ 1328):stable   -- CT brain w/o (07/09/18 @ 0430):stable   -- CT brain w/o (07/08/18):postop changes from decompressive craniectomy with interval mild extracalvarial brain herniation through the craniectomy site; mild increase in cerebral edema at the tumor resection bed and adjacent parenchyma without significant increase in hemorrhage   -- MRI brain w/wo (07/06/18):gross total resection of a left medial tentorial meningioma; hemorrhagic productswithin the resection cavity extending into the parenchyma of the left thalamus and tracking down within the left midbrain and extending to the fourth ventricle   -- CT brain w/o (07/06/18): postop changes from left  decompressive craniectomy for evacuation of hematoma of tumor bed with residual hemorrhage and improved edema and rightward midline shift  -- CT brain w/o (07/06/18): postop changes from decompressive craniectomy for evacuation of tumor bed hematoma with minimal residual blood at the surgical site   -- CT brain w/o (07/05/18): postop changes with development of hematoma at tumor bed and 8 mm of midline shift  -- Pain/spasm control: Tylenol PRN, Motrin PRN  -- Diet: NPO + Osmolite TF  -- Bowel regimen held, last BM 07/24/18   -- Enema ordered  -- Abx: Cefepime for cellulitis, started 07/21/18 w/ end-date of 07/28/18   -- CSF (12/31): NGTD   -- Bronch Brush (07/21/18): gram positive cocci   -- Blood Cx (07/21/18): negative   -- UA (07/21/18): WNL   -- BAL (07/17/18): 15000 Anginosus group Viridans Streptococcus   -- CDiff (07/15/18): negative   -- Urine Cx (07/16/18): C. albicans  -- Activity: Up in chair with assist TID   -- Craniectomy precautions, helmet to be worn at all times when OOB  -- PT/OT: SNF vs LTAC  -- DVT ppx: Lovenox, SCDs/Venodynes  -- Consults:    NCCU/Neurology   -- Will need outpatient neurology follow-up for AED management   Trauma   -- Trach/PEG completed 07/15/18  -- Lines/Drains: Trach/PEG  -- Wound: L fronto-temporal incision (staples removed), JP drain Rapide, LD Rapide  -- Disposition: ongoing    Leta Baptist, MD  PGY-3 Neurosurgery  07/29/2018         I saw and examined the patient.  I reviewed the resident's note.  I agree with the findings and plan of care as documented in the resident's note.  Any exceptions/additions are edited/noted.    [name], MD

## 2018-07-28 NOTE — Nurses Notes (Addendum)
pt noted with 50 ml brownish residual on peg tube. Do you still want me to feed the pt and give the med? Text paged service.    2218: spoke with MD Purcell Nails, said to continue giving scheduled meds except feedings. Also he will order for fleet enema.    2301: fleet enema given as ordered.

## 2018-07-28 NOTE — Nurses Notes (Signed)
Patient's daughters are concerned that patient has not had a bowel movement since 1/1 and that her abdomen appears much larger and firmer than usual. 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 Tube feeding hung to gravity at 0900 has not completed. Was able to administer 237 ml tube of Osmolite 1.5 via syringe. Paged neurosurgery.  Dr Denyse Amass lawrence returned page and updated. Order received for KUB. Told Dr Purcell Nails that do not feel comfortable administering tube feedings or medication until KUB completed. Per Dr Purcell Nails, this is  OK.

## 2018-07-28 NOTE — Nurses Notes (Signed)
Alerted by bedside nurse that pt had brown residual coming from peg tube. Upon assessment, pt peg residual was brown and malodorous. Spoke with Dr. Purcell Nails, he stated to give meds only until Trauma would be consulted. Dr. Purcell Nails reviewed imaging and ordered a fleet enema at this time.    Nancie Neas, RN  07/29/2018,2213

## 2018-07-28 NOTE — Care Plan (Signed)
Problem: Adult Inpatient Plan of Care  Goal: Plan of Care Review  Outcome: Ongoing (see interventions/notes)  Goal: Patient-Specific Goal (Individualization)  Outcome: Ongoing (see interventions/notes)  Goal: Absence of Hospital-Acquired Illness or Injury  Outcome: Ongoing (see interventions/notes)  Goal: Optimal Comfort and Wellbeing  Outcome: Ongoing (see interventions/notes)  Goal: Rounds/Family Conference  Outcome: Ongoing (see interventions/notes)     Problem: Fall Injury Risk  Goal: Absence of Fall and Fall-Related Injury  Outcome: Ongoing (see interventions/notes)     Problem: Skin Injury Risk Increased  Goal: Skin Health and Integrity  Outcome: Ongoing (see interventions/notes)     Problem: Hypertension Comorbidity  Goal: Blood Pressure in Desired Range  Outcome: Ongoing (see interventions/notes)     Problem: Depression  Goal: Improved Mood  Outcome: Ongoing (see interventions/notes)     Problem: Feeding Intolerance (Enteral Nutrition)  Goal: Feeding Tolerance  Outcome: Ongoing (see interventions/notes)     Problem: Bleeding (Craniotomy/Craniectomy/Cranioplasty)  Goal: Absence of Bleeding  Outcome: Ongoing (see interventions/notes)     Problem: Cerebral Tissue Perfusion Risk (Craniotomy/Craniectomy/Cranioplasty)  Goal: Effective Cerebral Tissue Perfusion  Outcome: Ongoing (see interventions/notes)     Problem: Fluid Imbalance (Craniotomy/Craniectomy/Cranioplasty)  Goal: Fluid Balance  Outcome: Ongoing (see interventions/notes)     Problem: Infection (Craniotomy/Craniectomy/Cranioplasty)  Goal: Absence of Infection Signs/Symptoms  Outcome: Ongoing (see interventions/notes)     Problem: Pain (Craniotomy/Craniectomy/Cranioplasty)  Goal: Acceptable Pain Control  Outcome: Ongoing (see interventions/notes)     Problem: Bleeding (Surgery Nonspecified)  Goal: Absence of Bleeding  Outcome: Ongoing (see interventions/notes)     Problem: Bowel Elimination Impaired (Surgery Nonspecified)  Goal: Effective Bowel  Elimination  Outcome: Ongoing (see interventions/notes)     Problem: Infection (Surgery Nonspecified)  Goal: Absence of Infection Signs/Symptoms  Outcome: Ongoing (see interventions/notes)     Problem: Pain (Surgery Nonspecified)  Goal: Acceptable Pain Control  Outcome: Ongoing (see interventions/notes)     Problem: Seizure, Active Management  Goal: Absence of Seizure/Seizure-Related Injury  Outcome: Ongoing (see interventions/notes)     Pt's GCS 6 (E1M4V1). Noted increased oral and thick tracheal secretions. Suctioned secretions done. O2 sat maintained at 98-99% via tracheal mask at 10 LPM. Turned to sides every 2 hours done. Scheduled feedings given, tolerated well. Pt has been incontinent overnight. Bath was provided. Fall precaution maintained. No febrile nor seizure episode. Needs attended.

## 2018-07-28 NOTE — Code Documentation (Signed)
ADULT RAPID RESPONSE NOTE    RRT ALERT: Yes    RRT ALERT: Yes Time RRT Arrived: 1158 Source of Request: MEWS: 8+   RRT APP Present: No RT Present: Yes      MEWS Score     MEWS SCORE (READ ONLY): 5    Primary Team Notification                  Patient's Primary Team Present: No response required     Adult Rapid Response Team Event  RRT Events: Documentation Update     Primary Concerns  Primary Concerns: Altered Mental Status         Secondary Concerns  Secondary Concerns: Urine Output         Subject Type: Patient    Inital Assessement: MEWS=8 Resp 2 Pulse 1 LOC 3 urine 2   Assessment: eyes closed. vital signs noted. family at bedside   Intervention: urine quantified and MEWS decreased to 6     Adult Rapid Response Team - Time Out     Time RRT Departed: 1211

## 2018-07-28 NOTE — Nurses Notes (Signed)
KUB completed at 1340. Paged neurosurgery to inquire about results and whether medications and tube feedings should be given.  39- Dr Sherrilee Gilles returned page. States OK to restart intermittent tube feedings and medications. Dr Purcell Nails to order medications for constipation.  Also, telephone order received to discontinued orders that are no longer relevant (e.g. Flexiseal).

## 2018-07-28 NOTE — Nurses Notes (Signed)
Paged RT regarding bite block.  RT returned page and states does not know how to order bite block but will place if is ordered.  1947- when spoke to pt's daughter [name] states that patient has had bite block this admission and that it has worked for a while. Will not order at this time and patient is not biting tongue at this time. Will continue to monitor.

## 2018-07-29 ENCOUNTER — Inpatient Hospital Stay (HOSPITAL_COMMUNITY)

## 2018-07-29 DIAGNOSIS — J984 Other disorders of lung: Secondary | ICD-10-CM

## 2018-07-29 DIAGNOSIS — R918 Other nonspecific abnormal finding of lung field: Secondary | ICD-10-CM

## 2018-07-29 DIAGNOSIS — K9429 Other complications of gastrostomy: Secondary | ICD-10-CM

## 2018-07-29 LAB — CBC WITH DIFF
BASOPHIL #: <0.1 10*3/uL (ref <=0.20)
BASOPHIL %: 1 %
EOSINOPHIL #: 0.22 x10ˆ3/uL (ref <=0.50)
EOSINOPHIL %: 4 %
HCT: 26.7 % — ABNORMAL LOW (ref 34.8–46.0)
HGB: 8.3 g/dL — ABNORMAL LOW (ref 11.5–16.0)
HGB: 8.3 g/dL — ABNORMAL LOW (ref 11.5–16.0)
IMMATURE GRANULOCYTE #: <0.1 10*3/uL (ref <0.10)
IMMATURE GRANULOCYTE %: 1 % (ref 0–1)
IMMATURE GRANULOCYTE %: 1 % (ref 0–1)
LYMPHOCYTE #: 1.17 x10ˆ3/uL (ref 1.00–4.80)
LYMPHOCYTE %: 19 %
MCH: 28.7 pg (ref 26.0–32.0)
MCHC: 31.1 g/dL (ref 31.0–35.5)
MCV: 92.4 fL (ref 78.0–100.0)
MONOCYTE #: 0.69 x10ˆ3/uL (ref 0.20–1.10)
MONOCYTE %: 11 %
MPV: 9.1 fL (ref 8.7–12.5)
NEUTROPHIL #: 4.03 10*3/uL (ref 1.50–7.70)
NEUTROPHIL %: 64 %
PLATELETS: 364 x10ˆ3/uL (ref 150–400)
RBC: 2.89 x10ˆ6/uL — ABNORMAL LOW (ref 3.85–5.22)
RDW-CV: 16.7 % — ABNORMAL HIGH (ref 11.5–15.5)
WBC: 6.2 x10ˆ3/uL (ref 3.7–11.0)

## 2018-07-29 LAB — POC BLOOD GLUCOSE (RESULTS)
GLUCOSE, POC: 106 mg/dL — ABNORMAL HIGH (ref 70–105)
GLUCOSE, POC: 106 mg/dl — ABNORMAL HIGH (ref 70–105)
GLUCOSE, POC: 110 mg/dL — ABNORMAL HIGH (ref 70–105)
GLUCOSE, POC: 115 mg/dL — ABNORMAL HIGH (ref 70–105)
GLUCOSE, POC: 112 mg/dL — ABNORMAL HIGH (ref 70–105)
GLUCOSE, POC: 117 mg/dL — ABNORMAL HIGH (ref 70–105)

## 2018-07-29 LAB — BASIC METABOLIC PANEL
ANION GAP: 7 mmol/L (ref 4–13)
BUN/CREA RATIO: 28 — ABNORMAL HIGH (ref 6–22)
BUN: 17 mg/dL (ref 8–25)
CALCIUM: 9.2 mg/dL (ref 8.5–10.2)
CHLORIDE: 101 mmol/L (ref 96–111)
CO2 TOTAL: 27 mmol/L (ref 22–32)
CREATININE: 0.6 mg/dL (ref 0.49–1.10)
ESTIMATED GFR: >60 mL/min/1.73mˆ2 (ref >60)
GLUCOSE: 113 mg/dL (ref 65–139)
POTASSIUM: 3.9 mmol/L (ref 3.5–5.1)
SODIUM: 135 mmol/L — ABNORMAL LOW (ref 136–145)

## 2018-07-29 LAB — PHOSPHORUS: PHOSPHORUS: 3.8 mg/dL (ref 2.4–4.7)

## 2018-07-29 LAB — MAGNESIUM: MAGNESIUM: 2 mg/dL (ref 1.6–2.6)

## 2018-07-29 MED ORDER — SENNA LEAF EXTRACT 176 MG/5 ML ORAL SYRUP
10.0000 mL | ORAL_SOLUTION | Freq: Two times a day (BID) | ORAL | Status: DC
Start: 2018-07-29 — End: 2018-07-29

## 2018-07-29 MED ORDER — BISACODYL 10 MG RECTAL SUPPOSITORY
10.0000 mg | Freq: Once | RECTAL | Status: AC | PRN
Start: 2018-07-29 — End: 2018-07-29
  Administered 2018-07-29: 10 mg via RECTAL
  Filled 2018-07-29: qty 1

## 2018-07-29 MED ADMIN — levETIRAcetam 500 mg tablet: GASTROSTOMY | @ 11:00:00

## 2018-07-29 MED ADMIN — nystatin 100,000 unit/mL oral suspension: ORAL | @ 22:00:00

## 2018-07-29 MED ADMIN — phenytoin 100 mg/4 mL oral suspension: GASTROSTOMY | @ 16:00:00

## 2018-07-29 MED ADMIN — enoxaparin 40 mg/0.4 mL subcutaneous syringe: SUBCUTANEOUS | @ 16:00:00

## 2018-07-29 MED ADMIN — lactated Ringers intravenous solution: GASTROSTOMY | @ 07:00:00 | NDC 00338011704

## 2018-07-29 NOTE — Consults (Signed)
[facility]  Surgery Consult  Follow Up    [name], [name], 58 y.o. female  Date of Service: 07/29/2018  Date of Birth:  [birth year]    Hospital Day:  LOS: 24 days     Chief Complaint:  TF reflux  Subjective: Pt is non-verbal per my assessment.      Objective:  Temperature: 36.8 C (98.2 F)  Heart Rate: 96  BP (Non-Invasive): 113/75  Respiratory Rate: (!) 28  SpO2: 94 %  Pain Score (Numeric, Faces): Other  Constitutional:  appears chronically ill  Neck:  supple, symmetrical, trachea midline  Respiratory:  Clear to auscultation bilaterally.   Cardiovascular:  regular rate and rhythm  Gastrointestinal:  Soft, non-tender, non-distended, PEG tube in place at 4 cm at the skin. No evidence of leakage of fluid of any kind. skin around bumper is pink dry, no evidence of cellulitis or breakdown,  Integumentary:  Skin warm and dry    Labs:    I have reviewed all lab results.  Lab Results Today:    Results for orders placed or performed during the hospital encounter of 07/05/18 (from the past 24 hour(s))   POC BLOOD GLUCOSE (RESULTS)   Result Value Ref Range    GLUCOSE, POC 175 (H) 70 - 105 mg/dl   POC BLOOD GLUCOSE (RESULTS)   Result Value Ref Range    GLUCOSE, POC 106 (H) 70 - 105 mg/dl   POC BLOOD GLUCOSE (RESULTS)   Result Value Ref Range    GLUCOSE, POC 110 (H) 70 - 105 mg/dl   POC BLOOD GLUCOSE (RESULTS)   Result Value Ref Range    GLUCOSE, POC 115 (H) 70 - 105 mg/dl       Imaging Studies:  reviewed     Assessment/Recommendations:  Pt is a 58 yo female s/p PEG placement 07/15/18 with suspected leakage/reflux of stool. No evidence of TF or stool leakage is appreciated.     - No acute surgical intervention  - Recommend continuing aggressive bowel regimen per primary team to alleviate stool burden.   - Recommend resuming tube feeds with return of bowel function, consider gravity feeds and advance as tolerated.   - Questions or concerns please page trauma surgery blue.     Electronically Signed by:    Mackey Birchwood  MD  PGY-1 General Surgery  07/29/2018 16:12      Mackey Birchwood, MD      Late entry for 07/29/18. I saw and examined the patient.  I reviewed the resident's note.  I agree with the findings and plan of care as documented in the resident's note.  Any exceptions/additions are edited/noted.    Lydia Guiles, MD

## 2018-07-29 NOTE — Nurses Notes (Deleted)
Alerted by respiratory that pt CO2 69. Paged service.

## 2018-07-29 NOTE — Nurses Notes (Signed)
Patient continues to only withdraw to pain and does not open eyes. Respiratory rate 29. Leonides Schanz, respiratory therapist notified and will assess patient. Will continue to hold tube feedings per service. Abdomen is soft, hypoactive tinkling bowel sounds in all quadrants.

## 2018-07-29 NOTE — Care Plan (Signed)
Problem: Adult Inpatient Plan of Care  Goal: Plan of Care Review  Outcome: Ongoing (see interventions/notes)  Goal: Patient-Specific Goal (Individualization)  Outcome: Ongoing (see interventions/notes)  Goal: Absence of Hospital-Acquired Illness or Injury  Outcome: Ongoing (see interventions/notes)  Goal: Optimal Comfort and Wellbeing  Outcome: Ongoing (see interventions/notes)  Goal: Rounds/Family Conference  Outcome: Ongoing (see interventions/notes)     Problem: Fall Injury Risk  Goal: Absence of Fall and Fall-Related Injury  Outcome: Ongoing (see interventions/notes)     Problem: Skin Injury Risk Increased  Goal: Skin Health and Integrity  Outcome: Ongoing (see interventions/notes)     Problem: Hypertension Comorbidity  Goal: Blood Pressure in Desired Range  Outcome: Ongoing (see interventions/notes)     Problem: Depression  Goal: Improved Mood  Outcome: Ongoing (see interventions/notes)     Problem: Feeding Intolerance (Enteral Nutrition)  Goal: Feeding Tolerance  Outcome: Ongoing (see interventions/notes)     Problem: Bleeding (Craniotomy/Craniectomy/Cranioplasty)  Goal: Absence of Bleeding  Outcome: Ongoing (see interventions/notes)     Problem: Cerebral Tissue Perfusion Risk (Craniotomy/Craniectomy/Cranioplasty)  Goal: Effective Cerebral Tissue Perfusion  Outcome: Ongoing (see interventions/notes)     Problem: Fluid Imbalance (Craniotomy/Craniectomy/Cranioplasty)  Goal: Fluid Balance  Outcome: Ongoing (see interventions/notes)     Problem: Infection (Craniotomy/Craniectomy/Cranioplasty)  Goal: Absence of Infection Signs/Symptoms  Outcome: Ongoing (see interventions/notes)     Problem: Pain (Craniotomy/Craniectomy/Cranioplasty)  Goal: Acceptable Pain Control  Outcome: Ongoing (see interventions/notes)     Problem: Bleeding (Surgery Nonspecified)  Goal: Absence of Bleeding  Outcome: Ongoing (see interventions/notes)     Problem: Bowel Elimination Impaired (Surgery Nonspecified)  Goal: Effective Bowel  Elimination  Outcome: Ongoing (see interventions/notes)     Problem: Infection (Surgery Nonspecified)  Goal: Absence of Infection Signs/Symptoms  Outcome: Ongoing (see interventions/notes)     Problem: Pain (Surgery Nonspecified)  Goal: Acceptable Pain Control  Outcome: Ongoing (see interventions/notes)     Problem: Seizure, Active Management  Goal: Absence of Seizure/Seizure-Related Injury  Outcome: Ongoing (see interventions/notes)   Pt unresponsive, suctioned, trach care, dressing change, repositioned every two hours, assessed responsiveness, medication administration, fall precautions maintained.

## 2018-07-29 NOTE — Nurses Notes (Signed)
patient did not have bowel movement after Fleet enema at 2301. Text paged service

## 2018-07-29 NOTE — Care Management Notes (Signed)
[facility]  Care Management Note    Patient Name: [name]  Date of Birth: August 30, 1960  Sex: female  Date/Time of Admission: 07/05/2018  7:26 AM  Room/Bed: 10/A  Payor: HEALTH PLAN / Plan: HEALTH PLAN (PPO-NON ST EMP) / Product Type: PPO /    LOS: 24 days   Primary Care Providers:  Isac Caddy, MD, MD (General)    Admitting Diagnosis:  Meningioma (CMS [facility]) [D32.9]    Assessment:      07/29/18 1557   Assessment Details   Assessment Type Continued Assessment   Date of Care Management Update 07/29/18   Date of Next DCP Update 08/01/18   Readmission   Is this a readmission? No   Care Management Plan   Discharge Planning Status plan in progress   Projected Discharge Date 08/02/18   Discharge plan discussed with: MPOA  (daughter, [name])   CM will evaluate for rehabilitation potential yes   Patient choice offered to patient/family yes  (LTAC, SNF)   Form for patient choice reviewed/signed and on chart no   Patient aware of possible cost for ambulance transport?  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 Patient's daughter, [name], [place] [phone]). Per service, patient not medically cleared for D/C. PT/OT recommending LTAC vs SNF. CMA checked benefits for LTAC and SNF and in network providers and provided list to daughter and discussed recommendations and explained benefits. She wished to review information and for [facility] to F/U  1/7. Anticipated D/C to LTAC once medically cleared/facility secured. Ambulance to provide transportation.    Patient's daughter requested assistance with affidavit and evaluation report forms for guardianship for patient. Discussed with service who stated for fax to medical records. Forms faxed to [phone].    The patient will continue to be evaluated for developing discharge needs.     Case Manager: Suanne Marker, [place] COORDINATOR  Phone: [phone]

## 2018-07-29 NOTE — Nurses Notes (Signed)
Patient's blood pressure has been running low. Manual reading at 1000- 108/66. Spoke with Dr Keane Police. Verbal order received to hold 40 mg Lisinopril AM dose.

## 2018-07-29 NOTE — Care Plan (Signed)
[facility]  Medical Nutrition Therapy Follow Up    SUBJECTIVE: Met with pt and family at bedside - pt remains with ATC with double flow O2. Pt remains nonverbal. Daughter reports that pt was not tolerating TFs well yesterday. Per chart review, pt with an episode of emesis and brown residue from PEG site. Found to have large stool burden - TFs currently being held. Daughter notes that pt has not had a BM in 5 days. She also thinks the bolus feeds are too much volume at once for pt and would like to try gravity feeds when TFs to be restarted. No other questions or concerns at this time.       OBJECTIVE:     Current Diet Order/Nutrition Support:  MNT PROTOCOL FOR DIETITIAN  DIET NPO - NOW EXCEPT TUBE FEEDS   ADULT TUBE FEED - BOLUS Q4H WHILE AWAKE NO MEALS, TF ONLY; OSMOLITE 1.5; NG; BOLUS; Bolus Amount: 1 can 5 times daily + 1 packet ProSource - CURRENTLY HELD d/t large stool burden  Provides: 1835 kcal, 90 g protein, 905 ml free water      Height Used for Calculations: 167.6 cm (5' 5.98")  Weight Used For Calculations: 78.2 kg (172 lb 6.4 oz)(standing weight upon admission)  BMI (kg/m2): 27.9  BMI Assessment: BMI 25-29.9: overweight  Ideal Body Weight (IBW) (kg): 59.54  % Ideal Body Weight: 131.34  Adjusted/Standard Body Weight  Adjusted BW: 64 kg          Re-assessed needs if applicable:  Energy Calorie Requirements: 1900-2100 kcal per day (30-33 kcal/64 kg Adj BW)   Protein Requirements (gms/day): 78-90 grams per day (1.3-1.5 gm/60 kg IBW)  Fluid Requirements: 1600 mL per day (25 mLs/64 kg Adj BW)    Comments: PMH HTN, HLD w/ 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 Nonverbal.        Intake/Output Summary (Last 24 hours) at 07/29/2018 1435  Last data filed at 07/29/2018 0945  Gross per 24 hour    Intake 1037 ml   Output --   Net 1037 ml       Plan/Interventions :   1. Once large stool burden has been resolved, restart TF schedule as follows:  Tube Feed Formula: Osmolite 1.5, intermittent bolus (30-60 minutes) 1 can at 0500, 0900, 1300, 1700, 2200 (5 cans total + 1 Packet ProSource)   1860cal = 29cals/kg   90g protein = 1.5g/kg   976m free water         NO fiber    2. Monitor I/Os for hydration status and add free water flushes prn  3. Monitor daily weight trends - weight up 11.6 kg since admission, currently +10.2 L  4. Continue aggressive bowel regimen given large stool burden - last BM 5 days ago  5. Continue 500 mg Vitamin C BID as medically appropriate to help with healing   6. Recommend transitioning to a fiber containing formula once bowel habits have been regulated - Ensure Plus 5 cans daily + 1 packet ProSource (1860 kcal, 81 g protein, 15 g fiber)  Will monitor progress and make further recommendations prn.       Nutrition Diagnosis:Swallowing difficultyrelated to trach in place, decreased mental statusas evidenced by Need for TF (addendum; in progress)      [place]dith Dark [place] 07/29/2018, 12:34  Pager # [phone]

## 2018-07-30 ENCOUNTER — Other Ambulatory Visit (HOSPITAL_COMMUNITY): Payer: Self-pay

## 2018-07-30 LAB — PHENYTOIN, FREE: FREE PHENYTOIN LEVEL: <0.5 ug/mL — ABNORMAL LOW (ref 1.0–2.0)

## 2018-07-30 LAB — POC BLOOD GLUCOSE (RESULTS)
GLUCOSE, POC: 154 mg/dL — ABNORMAL HIGH (ref 70–105)
GLUCOSE, POC: 140 mg/dL — ABNORMAL HIGH (ref 70–105)
GLUCOSE, POC: 78 mg/dL (ref 70–105)
GLUCOSE, POC: 96 mg/dL (ref 70–105)

## 2018-07-30 MED ORDER — MAGNESIUM CITRATE ORAL SOLUTION
296.0000 mL | Freq: Every day | ORAL | Status: DC
Start: 2018-07-30 — End: 2018-08-08
  Administered 2018-07-30: 296 mL via GASTROSTOMY
  Administered 2018-07-31 – 2018-08-07 (×8): 0 mL via GASTROSTOMY
  Filled 2018-07-30: qty 296

## 2018-07-30 MED ORDER — NICOTINE 7 MG/24 HR DAILY TRANSDERMAL PATCH
7.0000 mg | MEDICATED_PATCH | Freq: Every day | TRANSDERMAL | Status: DC
Start: 2018-07-31 — End: 2018-08-08
  Administered 2018-07-31 – 2018-08-07 (×8): 7 mg via TRANSDERMAL
  Filled 2018-07-30 (×8): qty 1

## 2018-07-30 MED ADMIN — levETIRAcetam 500 mg tablet: GASTROSTOMY | @ 22:00:00

## 2018-07-30 MED ADMIN — phenytoin 100 mg/4 mL oral suspension: GASTROSTOMY | @ 22:00:00

## 2018-07-30 MED ADMIN — sodium chloride 0.9 % (flush) injection syringe: ORAL | @ 22:00:00

## 2018-07-30 NOTE — Nurses Notes (Signed)
Service paged   "FYI, room 1010, [place], she had thick light yellow secretions in tracheostomy cuff. her morning oral temperature is 37.9(100.2). [address]31"

## 2018-07-30 NOTE — Care Plan (Signed)
[name]  Occupational Therapy Progress Note    Patient Name: [name]  Date of Birth: [birth year]  Height:  167.6 cm (5' 5.98")  Weight:  89.8 kg (197 lb 15.6 oz)  Room/Bed: 10/A  Payor: HEALTH PLAN / Plan: HEALTH PLAN (PPO-NON ST EMP) / Product Type: PPO /     Assessment:      [name] tolerated OT treatment session poorly this date. Patient with flaccid tone in BUEs, which is change from previous sessions. Patient most aroused when coughing and positioned in R sidelying. Patient required DEP A x 2 for all ADLs and transfers. Patient tolerated Maxi Move to chair this date. Daughter at bedside concerned about neurological changes/decline as compared to previous days. Patient continues to be appropriate for OT in LTACH vs. SNF upon discharge.      Discharge Needs:     Equipment Recommendation: to be determined    Discharge Disposition:  skilled nursing facility, long term acute care facility   The above recommendation is based upon the current examination and evaluation performed on this date. As subsequent sessions are completed, recommendations will be updated accordingly.    JUSTIFICATION OF DISCHARGE RECOMMENDATION   Based on current diagnosis, functional performance prior to admission, and current functional performance, this patient requires continued OT services in skilled nursing facility, long term acute care facility in order to achieve significant functional improvements.    Plan:   Continue to follow patient according to established plan of care.  The risks/benefits of therapy have been discussed with the patient/caregiver and he/she is in agreement with the established plan of care.     Subjective & Objective:        07/30/18 1210   Therapist Pager   OT Assigned/ Pager # Gearlean Alf [phone]   Rehab Session   Document Type therapy progress note (daily note)   Total OT Minutes: 51   Patient Effort poor   Symptoms Noted During/After Treatment fatigue   General  Information   Patient Profile Reviewed? yes   General Observations of Patient [name] was lethargic, non-arousable other than with coughing spells, which were more frequent when rolling to R side. RN approved session. Patient's daughter at bedside. Patient seen with professional assist of PT for clustered care.   Medical Lines PEG Tube;PIV Line;Telemetry   Respiratory Status aerosol trach collar   Existing Precautions/Restrictions aspiration precautions;fall precautions;full code   Pre Treatment Status   Pre Treatment Patient Status Patient supine in bed;Sitter select activated;Nurse approved session   Support Present Pre Treatment  Family present   Communication Pre Treatment  Nurse   Mutuality/Individual Preferences   Anxieties, Fears or Concerns Daughter concerned that patient has neurologically declined over the past couple of days   Individualized Care Needs OOB via [place]; encourage ROM of all extremities   Self-Care   Current Activity Tolerance poor   Pain Assessment   Pre/Post Treatment Pain Comment Patient with no indication of pain.   Coping/Psychosocial   Observed Emotional State withdrawn   Coping/Psychosocial Response Interventions   Plan Of Care Reviewed With patient;daughter   Supportive Measures active listening utilized;goal setting facilitated;problem solving facilitated;self-care encouraged;relaxation techniques promoted;verbalization of feelings encouraged   Trust Relationship/Rapport care explained;choices provided;empathic listening provided;questions encouraged;reassurance provided;thoughts/feelings acknowledged   Cognitive Assessment/Interventions   Behavior/Mood Observations lethargic;sad/withdrawn;unable to arouse   Orientation Status unable/difficult to assess   Attention severe impairment   Follows Commands does not follow one step commands   Vision  Assessment/Interventions   Visual Impairment/Limitations other (see comments)  (No active eye opening, no tracking w/ assist to open  eyes)   RUE Assessment   RUE Other Flaccid/ no active tone present   LUE Assessment   LUE Other Flaccid/no active tone present   LLE Assessment   LLE Other Patient actively adducting hip and maintaining position during coughing spells while supine in bed with HOB elevated.   Mobility Assessment/Training   Additional Documentation Bed Mobility Assessment/Treatment (Group)   Bed Mobility Assessment/Treatment   Bed Mobility, Assistive Device Head of Bed Elevated;draw sheet   Supine-Sit Independence dependent (less than 25% patient effort);2 person assist required   Safety Issues cognitive deficits limit understanding;decreased use of arms for pushing/pulling;decreased use of legs for bridging/pushing;impaired trunk control for bed mobility   Impairments balance impaired;cognition impaired;coordination impaired;endurance;motor control impaired;muscle tone abnormal;postural control impaired;ROM decreased;sensory feedback impaired;strength decreased   Comment Patient tolerated rolling several times for hygiene to be completed, as well as for Maxi sling to be placed and secured for transfer to chair.   Roll Left Independence dependent (less than 25% patient effort);2 person assist required;verbal cues required   Roll Right Independence dependent (less than 25% patient effort);2 person assist required;verbal cues required   Scoot/Bridge Independence dependent (less than 25% patient effort);2 person assist required;verbal cues required   Transfer Assessment/Treatment   Bed-Chair Independence dependent (less than 25% patient effort);2 person assist required;verbal cues required   Bed-Chair-Bed Assist Device Maxi Move   Transfer Safety Issues cognitive deficits limit understanding;balance decreased during turns;sequencing ability decreased;step length decreased;weight-shifting ability decreased   Transfer Impairments balance impaired;cognition impaired;coordination impaired;endurance;motor control impaired;muscle tone  abnormal;postural control impaired;ROM decreased;sensory feedback impaired;strength decreased;vision impaired   Toileting Assessment/Training   Position supine   TOILETING ASSESSED Perineal hygiene   ASSISTANCE REQUIRED  Perineal hygiene   Independence Level  dependent (less than 25% patient effort)   Impairments activity tolerance impaired;balance impaired;cognition impaired;coordination impaired;motor control impaired;muscle tone abnormal;postural control impaired;ROM decreased;sensory feedback impaired;strength decreased;vision impaired   Comment Patient required total assistance to complete peri-hygiene following liquid urine and BM incontinence.   Grooming Assessment/Training   Position supine   Independence Level dependent (less than 25% patient effort)   Impairments activity tolerance impaired;balance impaired;cognition impaired;coordination impaired;motor control impaired;muscle tone abnormal;postural control impaired;ROM decreased;sensory feedback impaired;strength decreased;vision impaired   Comment Patient required total HOH assist using RUE to completed grooming task to wash face.   IADL Evaluation   IADL Comments Anticipate total assistance required for IADL tasks at this time.   Balance Skill Training   Comment Not able to assess    Therapeutic Exercise/Activity   Upper Extremity Range of Motion bilateral;shoulder flexion/extension;shoulder abduction/adduction;shoulder horizontal abduction/adduction;shoulder external/internal rotation;elbow flexion/extension;forearm supination/pronation;wrist flexion/extension   General Exercise (Hand, Therapeutic Exercise) bilateral;finger flexion/extension;thumb opposition   Exercise Type PROM (passive range of motion)   Position  supine   Sets/Reps  to tolerance   Post Treatment Status   Post Treatment Patient Status Patient sitting in bedside chair or w/c;Call light within reach;Sitter select activated  (positioned with pillows for trunk and BUEs, feet elevated)      Support Present Post Treatment  Family present   Communication Post Treatement Nurse   Occupational Therapy Clinical Impression   Functional Level at Time of Session Ms. Speelman tolerated OT treatment session poorly this date. Patient with flaccid tone in BUEs, which is change from previous sessions. Patient most aroused when coughing and positioned in R sidelying. Patient required DEP A x 2 for all  ADLs and transfers. Patient tolerated Maxi Move to chair this date. Daughter at bedside concerned about neurological changes/decline as compared to previous days. Patient continues to be appropriate for OT in LTACH vs. SNF upon discharge.   Anticipated Equipment Needs at Discharge to be determined   Anticipated Discharge Disposition skilled nursing facility;long term acute care facility       Therapist:   Darcus Pester, OT  Pager #: [phone]

## 2018-07-30 NOTE — Progress Notes (Signed)
[facility]  NEUROSURGERY   PROGRESS NOTE      [name], [name], 58 y.o. female  Date of Admission:  07/05/2018  Date of Service: 07/31/2018  Date of Birth:  04-Nov-1960    Referring Physician:  No ref. provider found    Post Op Day: 16 Days Post-Op S/P Procedure(s) (LRB):  TRACHEOSTOMY (N/A)  GASTROSCOPY WITH PLACEMENT PEG TUBE (N/A)    Chief Complaint: right upper and lower extremity weakness  Subjective: Neurology consulted to r/o multiple AEDs as cause for encephalopathy (per pharmacy)    Vital Signs:  Temp (24hrs) Max:37.9 C (027.7 F)      Systolic (41OIN), OMV:672 , Min:100 , CNO:709     Diastolic (62EZM), OQH:47, Min:66, Max:83    Temp  Avg: 37.5 C (99.5 F)  Min: 37 C (98.6 F)  Max: 37.9 C (100.2 F)  MAP (Non-Invasive)  Avg: 86.4 mmHG  Min: 54 mmHG  Max: 100 mmHG  Pulse  Avg: 99.9  Min: 94  Max: 105  Resp  Avg: 24  Min: 18  Max: 45  SpO2  Avg: 96.6 %  Min: 94 %  Max: 99 %  Pain Score (Numeric, Faces): Other    Min/Max/Avg ICP/CPP last 24hrs:   No data recorded    Today's Physical Exam:  GCS _0 T  -- Eyes do not open to noxious stimuli  -- Localizes in LUE w/ vigorous stimulation, Withdraws in RUE and BLE  PERRL  Face symmetric  +cough  Unable to assess hearing  CN _1 EOMI Unable to assess  CN 11 shrug symmetric Unable to assess  Muscle Strength Unable to assess  Unable to assess drift  -- L fronto-temporal incision c/d/i, staples removed  -- JP drain Rapide  -- LD Rapide    Current Medications:  acetaminophen (TYLENOL) 160 mg per 5 mL liquid - grape flavored, 650 mg, Gastric (NG, OG, PEG, GT), Q4H PRN  artificial tears ophthalmic solution, 1 Drop, Both Eyes, Q2H PRN  ascorbic acid (VITAMIN C) tablet, 500 mg, Gastric (NG, OG, PEG, GT), 2x/day  aspirin tablet 325 mg, 325 mg, Gastric (NG, OG, PEG, GT), Daily  bacitracin 500 units/gram topical ointment tube, , Topical, 2x/day  chlorhexidine gluconate (PERIDEX) 0.12% mouthwash, 15 mL, Swish & Spit, 2x/day  cloBAZam (ONFI) 2.5 mg per mL oral  liquid, 20 mg, Gastric (NG, OG, PEG, GT), Q8HRS  enoxaparin PF (LOVENOX) 40 mg/0.4 mL SubQ injection, 40 mg, Subcutaneous, Q24H  glycopyrrolate (ROBINUL) 0.2 mg/mL (200 mcg/mL) injection, 200 mcg, Intravenous, Q8H PRN  hydrALAZINE (APRESOLINE) injection 10 mg, 10 mg, Intravenous, Q4H PRN  ibuprofen (MOTRIN) tablet, 400 mg, Gastric (NG, OG, PEG, GT), Q6H PRN  labetalol (TRANDATE) 5 mg/mL injection, 10 mg, Intravenous, Q1H PRN  lacosamide (VIMPAT) tablet, 200 mg, Gastric (NG, OG, PEG, GT), 2x/day  lanolin-oxyquin-pet, hydrophil (BAG BALM) topical ointment, , Apply Topically, 2x/day PRN  levETIRAcetam (KEPPRA) tablet, 1,500 mg, Gastric (NG, OG, PEG, GT), 2x/day  lisinopril (PRINIVIL) tablet, 40 mg, Gastric (NG, OG, PEG, GT), Daily  magnesium citrate (CITROMA) oral liquid, 296 mL, Gastric (NG, OG, PEG, GT), Daily  nicotine (NICODERM CQ) transdermal patch (mg/24 hr), 7 mg, Transdermal, Daily  nutrition protein supplement 15 g per 30 mL packet, 1 Packet, Gastric (NG, OG, PEG, GT), Daily  nystatin (MYCOSTATIN) 100,000 units per mL oral liquid, 5 mL, Swish & Spit, 4x/day  nystatin (NYSTOP) 100,000 units/g topical powder, , Apply Topically, 2x/day  ondansetron (ZOFRAN) 2 mg/mL injection, 4 mg, Intravenous,  Q6H PRN  perflutren lipid microspheres (DEFINITY) 1.3 mL in NS 10 mL (tot vol) injection, 2 mL, Intravenous, Give in Cardiology  phenytoin (DILANTIN) 100 mg per 4 mL oral liquid, 100 mg, Gastric (NG, OG, PEG, GT), Q8HRS  polyethylene glycol (MIRALAX) oral packet, 17 g, Gastric (NG, OG, PEG, GT), Daily  SSIP insulin lispro (HUMALOG) 100 units/mL injection, 0-12 Units, Subcutaneous, Q4H PRN      I/O:  I/O last 24 hours:      Intake/Output Summary (Last 24 hours) at 07/31/2018 0206  Last data filed at 07/31/2018 0000  Gross per 24 hour   Intake 1290 ml   Output --   Net 1290 ml     I/O current shift:  01/07 1900 - 01/08 0659  In: 750 [GT:750]  Out: -     Antibiotics: Date Started Date Completed   1.     2.     3.     4.        Nutrition/Residuals:  MNT PROTOCOL FOR DIETITIAN  DIET NPO - NOW EXCEPT TUBE FEEDS  ADULT TUBE FEED - BOLUS Q4H WHILE AWAKE NO MEALS, TF ONLY; OSMOLITE 1.5; NG; INTERMITTENT - INFUSION SHOULD TAKE 30 - 60 MINUTES; Bolus Amount: 1 can at 0500, 0900, 1300, 1700, 2200    Labs  Please indicate ordered or reviewed)  Reviewed:   Lab Results Today:    Results for orders placed or performed during the hospital encounter of 07/05/18 (from the past 24 hour(s))   POC BLOOD GLUCOSE (RESULTS)   Result Value Ref Range    GLUCOSE, POC 154 (H) 70 - 105 mg/dl   POC BLOOD GLUCOSE (RESULTS)   Result Value Ref Range    GLUCOSE, POC 140 (H) 70 - 105 mg/dl   PHENYTOIN, FREE   Result Value Ref Range    FREE PHENYTOIN LEVEL <0.5 (L) 1.0 - 2.0 ug/mL   POC BLOOD GLUCOSE (RESULTS)   Result Value Ref Range    GLUCOSE, POC 78 70 - 105 mg/dl   POC BLOOD GLUCOSE (RESULTS)   Result Value Ref Range    GLUCOSE, POC 96 70 - 105 mg/dl   PHENYTOIN, FREE   Result Value Ref Range    FREE PHENYTOIN LEVEL <0.5 (L) 1.0 - 2.0 ug/mL       Assessment/ Plan:  Active Hospital Problems    Diagnosis   . 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 Tobacco use disorder   . 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 POD26/26  -- FU vEEG                -- Keppra1500 mg 2x/day                -- Vimpat 200 mg 2x/day                -- Dilantin 100 mg q8h                -- Clobazam 20 mg q8h                -- Will need outpatient neurology follow-up  -- Please bathe patient daily and clean wound daily  -- Okay for soap/shampoo  --  Apply bacitracin ointment BID, cleaning old ointment off before application of new ointment  --  Final pathology: WHO grade I meningioma  -- FU blood pressure                -- Restarted Lisinopril 42m daily                -- Previously started Carvedilol 12.5 mg 2x/day: Held  -- Imaging:                -- Venous duplex LLE (07/23/18): 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                -- CTV intra w/wo (07/20/18): sub-occlusive left transverse sinus thrombus                -- UKoreasoft tissue axilla (07/20/18): nonspecific area of increased echogenicity seen at the site of concern in the right axilla measuring approximately 6.5 x 2.0 x 8.9 cm; appears nonmass-like with hazy borders and is likely related to cellulitis                -- MRI-brain w/wo (07/20/18): possible non-occlusive thrombus in transverse sinus                -- CT-brain wo (07/19/18): interval decrease of hematoma                -- Venous duplex BLE (07/16/18): left peroneal (calf vein) DVT                -- Venous duplex BUE (07/16/18): no evidence of DVT                -- TTE (07/12/18): EF 75%, no regional wall abnormalities                -- CT brain w/o (07/11/18):stable                 -- CT brain w/o (07/09/18 @ 1328):stable                -- CT brain w/o (07/09/18 @ 0430):stable                -- CT brain w/o (07/08/18):postop changes from decompressive craniectomy with interval mild extracalvarial brain herniation through the craniectomy site; mild increase in cerebral edema at the tumor resection bed and adjacent parenchyma without significant increase in hemorrhage                -- MRI brain w/wo (07/06/18):gross total resection of a left medial tentorial meningioma; hemorrhagic productswithin the resection cavity extending into the parenchyma of the left thalamus and tracking down within the left midbrain and extending to the fourth ventricle                -- CT brain w/o (07/06/18): postop changes from left decompressive craniectomy for evacuation of hematoma of tumor bed with residual hemorrhage and  improved edema and rightward midline shift  -- CT brain w/o (07/06/18): postop changes from decompressive craniectomy for evacuation of tumor bed hematoma with minimal residual blood at the surgical site                -- CT brain w/o (07/05/18): postop changes with development of hematoma at tumor bed and 8 mm of midline shift  -- Pain/spasm control: Tylenol PRN, Motrin PRN  -- Diet: NPO + Osmolite TF  -- Bowel regimen held, last BM  07/30/18  -- Abx: Cefepime for cellulitis, started 07/21/18, ended 07/28/18                -- CSF (12/31): NGTD                -- Bronch Brush (07/21/18): gram positive cocci                -- Blood Cx (07/21/18): negative                -- UA (07/21/18): WNL                -- BAL (07/17/18): 15000 Anginosus group Viridans Streptococcus                -- CDiff (07/15/18): negative                -- Urine Cx (07/16/18): C. albicans  -- Activity: Up in chair with assist TID                -- Craniectomy precautions, helmet to be worn at all times when OOB  -- PT/OT: SNF vs LTAC  -- DVT ppx: Lovenox, SCDs/Venodynes  -- Consults:                 NCCU/Neurology     -- GPEDs on routine EEG, FU vEEG     -- FU phenytoin level                -- Will need outpatient neurology follow-up for AED management                Trauma                -- Trach/PEG completed 07/15/18     -- aggressive bowel regimen    -- resume tube feeds with return of bowel function, consider gravity feeds and advance as tolerated  -- Lines/Drains: Trach/PEG  -- Wound: L fronto-temporal incision (staples removed), JP drain Rapide, LD Rapide  -- Disposition: ongoing    Keane Police, M.D.  PGY-2 Neurosurgery  07/31/2018, 02:06       I saw and examined the patient.  I reviewed the resident's note.  I agree with the findings and plan of care as documented in the resident's note.  Any exceptions/additions are edited/noted.    [name], MD

## 2018-07-30 NOTE — Care Management Notes (Addendum)
1341: CCC attempted to follow-up with family re: LTAC preference; however, family not at bedside. Spoke with bedside nurse who stated daughter stepped out for lunch and would be back. Will re-attempt.    1600: CCC met with daughter at bedside to discuss LTAC placement/preference. [name] stated she started looking into facilities; however, was no ready to provide choice at this time. She expressed concern re: insurance coverage as she is unsure how long patient will have benefits not working. Discussed applying for Medicaid for patient; however, that Medicaid does not cover LTAC. I explained I spoke with Velna Hatchet, care manager with [employer] [phone]), who expressed concern patient may go into non-cert days as level of care could be provided in less acute facility, such as LTAC. She verbalized understanding and is to F/U with care management 1/8.      [place], CLINICAL CARE COORDINATOR  07/30/2018, 16:10

## 2018-07-30 NOTE — Progress Notes (Signed)
[facility]  NEUROSURGERY   PROGRESS NOTE      [name], [name], 58 y.o. female  Date of Admission:  07/05/2018  Date of Service: 07/30/2018  Date of Birth:  02-27-1961    Referring Physician:  No ref. provider found    Post Op Day: 15 Days Post-Op S/P Procedure(s) (LRB):  TRACHEOSTOMY (N/A)  GASTROSCOPY WITH PLACEMENT PEG TUBE (N/A)    Chief Complaint: right upper and lower extremity weakness  Subjective: NAEO    Vital Signs:  Temp (24hrs) Max:37.7 C (36.6 F)      Systolic (29UTM), LYY:503 , Min:97 , TWS:568     Diastolic (12XNT), ZGY:17, Min:64, Max:81    Temp  Avg: 37.2 C (98.9 F)  Min: 36.8 C (98.2 F)  Max: 37.7 C (99.9 F)  MAP (Non-Invasive)  Avg: 78.9 mmHG  Min: 54 mmHG  Max: 96 mmHG  Pulse  Avg: 98  Min: 94  Max: 103  Resp  Avg: 23.8  Min: 20  Max: 29  SpO2  Avg: 94.8 %  Min: 94 %  Max: 95 %  Pain Score (Numeric, Faces): Other    Min/Max/Avg ICP/CPP last 24hrs:   No data recorded    Today's Physical Exam:  GCS _0 T  -- Eyes do not open to noxious stimuli  -- Localizes in LUE w/ vigorous stimulation, Withdraws in RUE and BLE  PERRL  Face symmetric  +cough  Unable to assess hearing  CN _1 EOMI Unable to assess  CN 11 shrug symmetric Unable to assess  Muscle Strength Unable to assess  Unable to assess drift  -- L fronto-temporal incision c/d/i, staples removed  -- JP drain Rapide  -- LD Rapide    Current Medications:  acetaminophen (TYLENOL) 160 mg per 5 mL liquid - grape flavored, 650 mg, Gastric (NG, OG, PEG, GT), Q4H PRN  artificial tears ophthalmic solution, 1 Drop, Both Eyes, Q2H PRN  ascorbic acid (VITAMIN C) tablet, 500 mg, Gastric (NG, OG, PEG, GT), 2x/day  aspirin tablet 325 mg, 325 mg, Gastric (NG, OG, PEG, GT), Daily  bacitracin 500 units/gram topical ointment tube, , Topical, 2x/day  chlorhexidine gluconate (PERIDEX) 0.12% mouthwash, 15 mL, Swish & Spit, 2x/day  cloBAZam (ONFI) 2.5 mg per mL oral liquid, 20 mg, Gastric (NG, OG, PEG, GT), Q8HRS  enoxaparin PF (LOVENOX) 40  mg/0.4 mL SubQ injection, 40 mg, Subcutaneous, Q24H  glycopyrrolate (ROBINUL) 0.2 mg/mL (200 mcg/mL) injection, 200 mcg, Intravenous, Q8H PRN  hydrALAZINE (APRESOLINE) injection 10 mg, 10 mg, Intravenous, Q4H PRN  ibuprofen (MOTRIN) tablet, 400 mg, Gastric (NG, OG, PEG, GT), Q6H PRN  labetalol (TRANDATE) 5 mg/mL injection, 10 mg, Intravenous, Q1H PRN  lacosamide (VIMPAT) tablet, 200 mg, Gastric (NG, OG, PEG, GT), 2x/day  lanolin-oxyquin-pet, hydrophil (BAG BALM) topical ointment, , Apply Topically, 2x/day PRN  levETIRAcetam (KEPPRA) tablet, 1,500 mg, Gastric (NG, OG, PEG, GT), 2x/day  lisinopril (PRINIVIL) tablet, 40 mg, Gastric (NG, OG, PEG, GT), Daily  magnesium citrate (CITROMA) oral liquid, 296 mL, Gastric (NG, OG, PEG, GT), Daily  nicotine (NICODERM CQ) transdermal patch (mg/24 hr), 14 mg, Transdermal, Daily  NS flush syringe, 2 mL, Intracatheter, Q8HRS    And  NS flush syringe, 2-6 mL, Intracatheter, Q1 MIN PRN  NS flush syringe, 10-40 mL, Intravenous, Q8HRS  NS flush syringe, 20-30 mL, Intravenous, Q1 MIN PRN  nutrition protein supplement 15 g per 30 mL packet, 1 Packet, Gastric (NG, OG, PEG, GT), Daily  nystatin (MYCOSTATIN) 100,000 units per  mL oral liquid, 5 mL, Swish & Spit, 4x/day  nystatin (NYSTOP) 100,000 units/g topical powder, , Apply Topically, 2x/day  ondansetron (ZOFRAN) 2 mg/mL injection, 4 mg, Intravenous, Q6H PRN  perflutren lipid microspheres (DEFINITY) 1.3 mL in NS 10 mL (tot vol) injection, 2 mL, Intravenous, Give in Cardiology  phenytoin (DILANTIN) 100 mg per 4 mL oral liquid, 100 mg, Gastric (NG, OG, PEG, GT), Q8HRS  polyethylene glycol (MIRALAX) oral packet, 17 g, Gastric (NG, OG, PEG, GT), Daily  SSIP insulin lispro (HUMALOG) 100 units/mL injection, 0-12 Units, Subcutaneous, Q4H PRN      I/O:  I/O last 24 hours:      Intake/Output Summary (Last 24 hours) at 07/30/2018 0519  Last data filed at 07/29/2018 2300  Gross per 24 hour   Intake 1525 ml   Output --   Net 1525 ml     I/O current shift:   01/06 1900 - 01/07 0659  In: 515 [GT:515]  Out: -     Antibiotics: Date Started Date Completed   1.     2.     3.     4.       Nutrition/Residuals:  MNT PROTOCOL FOR DIETITIAN  DIET NPO - NOW EXCEPT TUBE FEEDS  ADULT TUBE FEED - BOLUS Q4H WHILE AWAKE NO MEALS, TF ONLY; OSMOLITE 1.5; NG; INTERMITTENT - INFUSION SHOULD TAKE 30 - 60 MINUTES; Bolus Amount: 1 can at 0500, 0900, 1300, 1700, 2200    Labs  Please indicate ordered or reviewed)  Reviewed:   Lab Results Today:    Results for orders placed or performed during the hospital encounter of 07/05/18 (from the past 24 hour(s))   POC BLOOD GLUCOSE (RESULTS)   Result Value Ref Range    GLUCOSE, POC 110 (H) 70 - 105 mg/dl   POC BLOOD GLUCOSE (RESULTS)   Result Value Ref Range    GLUCOSE, POC 115 (H) 70 - 105 mg/dl   POC BLOOD GLUCOSE (RESULTS)   Result Value Ref Range    GLUCOSE, POC 112 (H) 70 - 105 mg/dl   POC BLOOD GLUCOSE (RESULTS)   Result Value Ref Range    GLUCOSE, POC 117 (H) 70 - 105 mg/dl   BASIC METABOLIC PANEL   Result Value Ref Range    SODIUM 135 (L) 136 - 145 mmol/L    POTASSIUM 3.9 3.5 - 5.1 mmol/L    CHLORIDE 101 96 - 111 mmol/L    CO2 TOTAL 27 22 - 32 mmol/L    ANION GAP 7 4 - 13 mmol/L    CALCIUM 9.2 8.5 - 10.2 mg/dL    GLUCOSE 113 65 - 139 mg/dL    BUN 17 8 - 25 mg/dL    CREATININE 0.60 0.49 - 1.10 mg/dL    BUN/CREA RATIO 28 (H) 6 - 22    ESTIMATED GFR >60 >60 mL/min/1.29m2   MAGNESIUM   Result Value Ref Range    MAGNESIUM 2.0 1.6 - 2.6 mg/dL   PHOSPHORUS   Result Value Ref Range    PHOSPHORUS 3.8 2.4 - 4.7 mg/dL   CBC WITH DIFF   Result Value Ref Range    WBC 6.2 3.7 - 11.0 x10^3/uL    RBC 2.89 (L) 3.85 - 5.22 x10^6/uL    HGB 8.3 (L) 11.5 - 16.0 g/dL    HCT 26.7 (L) 34.8 - 46.0 %    MCV 92.4 78.0 - 100.0 fL    MCH 28.7 26.0 - 32.0 pg    MCHC 31.1  31.0 - 35.5 g/dL    RDW-CV 16.7 (H) 11.5 - 15.5 %    PLATELETS 364 150 - 400 x10^3/uL    MPV 9.1 8.7 - 12.5 fL    NEUTROPHIL % 64 %    LYMPHOCYTE % 19 %    MONOCYTE % 11 %    EOSINOPHIL % 4 %    BASOPHIL %  1 %    NEUTROPHIL # 4.03 1.50 - 7.70 x10^3/uL    LYMPHOCYTE # 1.17 1.00 - 4.80 x10^3/uL    MONOCYTE # 0.69 0.20 - 1.10 x10^3/uL    EOSINOPHIL # 0.22 <=0.50 x10^3/uL    BASOPHIL # <0.10 <=0.20 x10^3/uL    IMMATURE GRANULOCYTE % 1 0 - 1 %    IMMATURE GRANULOCYTE # <0.10 <0.10 x10^3/uL       Assessment/ Plan:  Active Hospital Problems    Diagnosis   . 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 Tobacco use disorder   . 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 POD25/25  -- vEEG completed previously; no electrographic seizures or clinical spells w/ left hemispheric sharp waves                -- Keppra1500 mg 2x/day                -- Vimpat 200 mg 2x/day                -- Dilantin 100 mg q8h                -- Clobazam 20 mg q8h                -- Will need outpatient neurology follow-up  -- Please bathe patient daily and clean wound daily  -- Okay for soap/shampoo  -- Apply bacitracin ointment BID, cleaning old ointment off before application of new ointment  -- Final pathology: WHO grade I meningioma  -- FU blood pressure                -- Restarted Lisinopril 32m daily                -- Previously started Carvedilol 12.5 mg 2x/day: Held  -- Imaging:                -- Venous duplex LLE (07/23/18): 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                -- CTV intra w/wo (07/20/18): sub-occlusive left transverse sinus thrombus                -- UKoreasoft tissue axilla (07/20/18): nonspecific area of increased echogenicity seen at the site of concern in the right axilla measuring approximately 6.5 x 2.0 x 8.9 cm; appears nonmass-like with hazy borders and is likely  related to cellulitis                -- MRI-brain w/wo (07/20/18): possible non-occlusive thrombus in transverse sinus                -- CT-brain wo (07/19/18): interval decrease of hematoma                --  Venous duplex BLE (07/16/18): left peroneal (calf vein) DVT                -- Venous duplex BUE (07/16/18): no evidence of DVT                -- TTE (07/12/18): EF 75%, no regional wall abnormalities                -- CT brain w/o (07/11/18):stable                 -- CT brain w/o (07/09/18 @ 1328):stable                -- CT brain w/o (07/09/18 @ 0430):stable                -- CT brain w/o (07/08/18):postop changes from decompressive craniectomy with interval mild extracalvarial brain herniation through the craniectomy site; mild increase in cerebral edema at the tumor resection bed and adjacent parenchyma without significant increase in hemorrhage                -- MRI brain w/wo (07/06/18):gross total resection of a left medial tentorial meningioma; hemorrhagic productswithin the resection cavity extending into the parenchyma of the left thalamus and tracking down within the left midbrain and extending to the fourth ventricle                -- CT brain w/o (07/06/18): postop changes from left decompressive craniectomy for evacuation of hematoma of tumor bed with residual hemorrhage and improved edema and rightward midline shift  -- CT brain w/o (07/06/18): postop changes from decompressive craniectomy for evacuation of tumor bed hematoma with minimal residual blood at the surgical site                -- CT brain w/o (07/05/18): postop changes with development of hematoma at tumor bed and 8 mm of midline shift  -- Pain/spasm control: Tylenol PRN, Motrin PRN  -- Diet: NPO + Osmolite TF  -- Bowel regimen held, last BM 07/24/18                -- Enema ordered  -- Surgery follow up:   - Recommend continuing aggressive bowel regimen per primary team to alleviate stool burden.   - Recommend resuming  tube feeds with return of bowel function, consider gravity feeds and advance as tolerated.   -- Abx: Cefepime for cellulitis, started 07/21/18, ended 07/28/18                -- CSF (12/31): NGTD                -- Bronch Brush (07/21/18): gram positive cocci                -- Blood Cx (07/21/18): negative                -- UA (07/21/18): WNL                -- BAL (07/17/18): 15000 Anginosus group Viridans Streptococcus                -- CDiff (07/15/18): negative                -- Urine Cx (07/16/18): C. albicans  -- Activity: Up in chair with assist TID                -- Craniectomy precautions, helmet to be worn at all times  when OOB  -- PT/OT: SNF vs LTAC  -- DVT ppx: Lovenox, SCDs/Venodynes  -- Consults:                 NCCU/Neurology                -- Will need outpatient neurology follow-up for AED management                Trauma                -- Trach/PEG completed 07/15/18  -- Lines/Drains: Trach/PEG  -- Wound: L fronto-temporal incision (staples removed), JP drain Rapide, LD Rapide  -- Disposition: ongoing    Hipolito Bayley, MD PhD      I saw and examined the patient.  I reviewed the resident's note.  I agree with the findings and plan of care as documented in the resident's note.  Any exceptions/additions are edited/noted.    [name], MD

## 2018-07-30 NOTE — Care Plan (Signed)
[facility]    Patient Name: [name]  Date of Birth: Feb 19, 1961  Height: Height: 167.6 cm (5' 5.98")  Weight: Weight: 89.8 kg (197 lb 15.6 oz)  Room/Bed: 1078/B  Payor: HEALTH PLAN / Plan: HEALTH PLAN (PPO-NON ST EMP) / Product Type: PPO /       Assessment:      (P) [name] continues to be minimally responsive and today noted to have signficantly reduced tone x 4 limbs., and was safely positioned in a recliner with B lateral supports and continuous family supervision    Discharge Needs:    Equipment Recommendation: (P) TBD     Discharge Disposition: (P) TBD    JUSTIFICATION OF DISCHARGE RECOMMENDATION   Based on current diagnosis, functional performance prior to admission, and current functional performance, this patient requires continued PT services in (P) TBD in order to achieve significant functional improvements in these deficit areas: (P) aerobic capacity/endurance, arousal, attention, and cognition, gait, locomotion, and balance, neuromuscular, ventilation and respiration/gas exchange.    Plan:   Current Intervention:    To provide physical therapy services (P) minimum of 2x/week  for duration of (P) until discharge.    The risks/benefits of therapy have been discussed with the patient/caregiver and he/she is in agreement with the established plan of care.       Past Medical History:   Diagnosis Date   . Anxiety    . Arthropathy    . Cancer (CMS HCC)     brain tumor   . Dyspnea on exertion    . Esophageal reflux     does not take meds   . Heart murmur     benign   . HTN (hypertension)    . Hyperlipemia    . Muscle weakness     right sided weakness   . Palpitations    . Shortness of breath    . Wears glasses     reading         Past Surgical History:   Procedure Laterality Date   . HX APPENDECTOMY     . HX BREAST AUGMENTATION Bilateral    . HX LAP CHOLECYSTECTOMY     . HX LUMBAR DISKECTOMY  1999   . HX OTHER Right     benign tumor  removed from upper right leg x3   . HX OTHER      exploratory lap   . HX TUBAL LIGATION            reports that she has been smoking cigarettes. She has a 30.00 pack-year smoking history. She has never used smokeless tobacco. She reports that she does not drink alcohol or use drugs.  Social History     Tobacco Use   Smoking Status Current Every Day Smoker   . Packs/day: 1.00   . Years: 30.00   . Pack years: 30.00   . Types: Cigarettes   Smokeless Tobacco Never Used   Tobacco Comment    down to 8 cigarettes, counseled on 1-800-QUIT_NOW         Subjective & Objective        07/30/18 1211   Therapist Pager   PT Assigned/ Pager # [phone]   Rehab Session   Document Type therapy progress note (daily note)   Total PT Minutes: 57   Patient Effort poor  (cognitive limitations)   General Information   Patient Profile Reviewed? yes   Pertinent History of Current  Functional Problem Daughter asked therapists to assess patient's current muscle tone and response to stimuli as compared to recent prior sessions   Medical Lines PEG Tube;PIV Line;Telemetry   Respiratory Status aerosol trach collar   Existing Precautions/Restrictions aspiration precautions;fall precautions;full code   Mutuality/Individual Preferences   Individualized Care Needs Maxi Move to chair; tight lateral supports when in chair and in room supervision by family or staff   Pre Treatment Status   Pre Treatment Patient Status Patient supine in bed;Nurse approved session;Sitter select activated   Support Present Pre Treatment  Family present   Communication Pre Treatment  Nurse   Communication Pre Treatment Comment Re OK for transfer OOB   Cognitive Assessment/Interventions   Behavior/Mood Observations lethargic   Orientation Status unable/difficult to assess   Attention severe impairment   Follows Commands does not follow one step commands   Vital Signs   Vitals Comment Floor status and not monitored   Pain Assessment   Pre/Post Treatment Pain Comment No behaviors  that suggested pain   Vision Assessment/Interventions   Visual Impairment/Limitations   (No eye opening, no convergence to target or tracking)   Additional Documentation   (eye lids dependently lifted for assessment)   General Extremity Assessment   Comment Associated reaction of LLE in response to strenuous coughing , flexing hips/knees and briefly holding in semi-hooklying   RUE Assessment   RUE ROM WNL   RUE Tone flaccid   LUE Assessment   LUE Tone hypotonic   RLE Assessment   RLE ROM WNL   RLE Tone moderately decreased tone   LLE Assessment   LLE ROM WNL   LLE Tone moderately decreased tone   Bed Mobility Assessment/Treatment   Bed Mobility, Assistive Device other (see comments)  (boost mode with bed flat for sliding up in bed)   Safety Issues cognitive deficits limit understanding   Scoot/Bridge Independence 2 person assist required;dependent (less than 25% patient effort)   Roll Right Independence 2 person assist required;dependent (less than 25% patient effort)   Roll Left Independence 2 person assist required;dependent (less than 25% patient effort)   Transfer Assessment/Treatment   Bed-Chair Independence dependent (less than 25% patient effort)   Bed-Chair-Bed Assist Device Maxi Move   Transfer Safety Issues cognitive deficits limit understanding   Transfer Impairments balance impaired;cognition impaired;motor control impaired;muscle tone abnormal;postural control impaired;sensory feedback impaired;strength decreased;vision impaired   Balance Skill Training   Sitting Balance: Static unable to balance   Sitting, Dynamic (Balance) unable to balance   Therapeutic Exercise/Activity   Comment PROM with gentle end-range stretching in multiple planes , x 4 limbs. She was repeatedly rolled to  R/L in course of repeated hygiene to address liquid bowel movements, also resulting in some prolonged R/L sidelying/ This produced a lot of sputum cleanance with significant improvement of central airway sounds. She was  dependently transferred to a recliner, tightly packed with pillow laterally for stability and back reclined sufficientlh to allow neck to remain in fairly neutral extension. Daughter agreed to monitor patient closely while in chair , and was instructed that if patient appears to become unstable to immediately contact her RN   Post Treatment Status   Post Treatment Patient Status Patient sitting in bedside chair or w/c;Sitter select activated;Call light within reach  (call bell within easy reach for daughter in chair beside pt)   Support Present Post Treatment  Family present   [place]  Reviewed With patient;daughter   Basic Mobility Am-PAC/6Clicks Score   Turning in bed without bedrails 1   Lying on back to sitting on edge of flat bed 1   Moving to and from a bed to a chair 1   Standing up from chair 1   Walk in room 1   Climbing 3-5 steps with railing 1   6 Clicks Raw Score total 6   Standardized (t-scale) score 16.59   CMS 0-100% Score 100   CMS Modifier CN   Patient Mobility Goal [facility]) 4- Move to chair 3X/day   Exercise/Activity Level Performed 4- Transferred to chair/commode   Physical Therapy Clinical Impression   Assessment Ms Perrin Maltese continues to be minimally responsive and today noted to have signficantly reduced tone x 4 limbs., and was safely positioned in a recliner with B lateral supports and continuous family supervision   Criteria for Skilled Therapeutic meets criteria   Pathology/Pathophysiology Noted neuromuscular;other (see comments)  (cognitive, somatosensory, visual)   Impairments Found (describe specific impairments) aerobic capacity/endurance;arousal, attention, and cognition;gait, locomotion, and balance;neuromuscular;ventilation and respiration/gas exchange   Functional Limitations in Following  self-care;home management;work;community/leisure   Disability: Inability to Perform work;community/leisure   Rehab Potential fair,  will monitor progress closely   Therapy Frequency minimum of 2x/week   Predicted Duration of Therapy Intervention (days/wks) until discharge   Anticipated Equipment Needs at Discharge (PT) TBD   Anticipated Discharge Disposition TBD         Therapist:   Cherlyn Labella, PT   Pager #: [phone]

## 2018-07-30 NOTE — Ancillary Notes (Signed)
[facility]  Neurolab Tech Note    [name]  DATE OF SERVICE:  07/30/2018          EEG has been completed.      Traci Sermon, [facility]

## 2018-07-30 NOTE — Progress Notes (Signed)
[facility]  NEUROSURGERY   PROGRESS NOTE    [name], [name], 58 y.o. female  Date of Admission:  07/05/2018  Date of Service: 08/01/2018  Date of Birth:  Jun 30, 1961    Chief Complaint: right upper and lower extremity weakness    Subjective:  Evaluated by neurology who recommend maintaining the current AED course.     Vital Signs:  Temp (24hrs) Max:38.3 C (710.6 F)      Systolic (26RSW), NIO:270 , Min:103 , JJK:093     Diastolic (81WEX), HBZ:16, Min:61, Max:74    Temp  Avg: 37.4 C (99.4 F)  Min: 36.3 C (97.3 F)  Max: 38.3 C (100.9 F)  MAP (Non-Invasive)  Avg: 83.4 mmHG  Min: 74 mmHG  Max: 92 mmHG  Pulse  Avg: 104.1  Min: 96  Max: 118  Resp  Avg: 20.5  Min: 20  Max: 22  SpO2  Avg: 95.8 %  Min: 93 %  Max: 99 %  Pain Score (Numeric, Faces): Other    Min/Max/Avg ICP/CPP last 24hrs:   No data recorded    Today's Physical Exam:    Appears acutely ill, intubated  GCS 1 5 1  T  -- Eyes do not open to noxious stimuli  -- Localizes in LUE, Withdraws in RUE and BLE  PERRL  Face symmetric  +cough    Unable to assess hearing  Unable to assess speech  Unable to assess fund of knowledge  Unable to assess attention span & concentration  Unable to assess recent and remote memory  CN 3 4 6  EOMI Unable to assess  CN 11 shrug symmetric Unable to assess  Muscle Strength Unable to assess  Unable to assess drift    -- L fronto-temporal incision c/d/i, staples removed  -- JP drain Rapide  -- LD Rapide    Assessment/Plan:  [name] is a 58 yo F PMH HTN, HLD w/ 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 POD27/27.  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                -- CTV intra w/wo (07/20/18): sub-occlusive left transverse sinus thrombus                -- US soft tissue axilla (07/20/18): nonspecific area of increased echogenicity seen at the site of concern in the right axilla measuring approximately 6.5 x 2.0 x 8.9 cm; appears nonmass-like with hazy borders and is likely related to cellulitis                -- MRI-brain w/wo (07/20/18): possible non-occlusive thrombus in transverse sinus                -- CT-brain wo (07/19/18): interval decrease of hematoma                -- Venous duplex BLE (07/16/18): left peroneal (calf vein)  DVT                -- Venous duplex BUE (07/16/18): no evidence of DVT                -- TTE (07/12/18): EF 75%, no regional wall abnormalities                -- CT brain w/o (07/11/18):stable                 -- CT brain w/o (07/09/18 @ 1328):stable                -- CT brain w/o (07/09/18 @ 0430):stable                -- CT brain w/o (07/08/18):postop changes from decompressive craniectomy with interval mild extracalvarial brain herniation through the craniectomy site; mild increase in cerebral edema at the tumor resection bed and adjacent parenchyma without significant increase in hemorrhage                -- MRI brain w/wo (07/06/18):gross total resection of a left medial tentorial meningioma; hemorrhagic productswithin the resection cavity extending into the parenchyma of the left thalamus and tracking down within the left midbrain and extending to the fourth ventricle                -- CT brain w/o (07/06/18): postop changes from left decompressive craniectomy for evacuation of hematoma of tumor bed with residual hemorrhage and improved edema and rightward midline shift  -- CT brain w/o (07/06/18): postop changes from decompressive  craniectomy for evacuation of tumor bed hematoma with minimal residual blood at the surgical site                -- CT brain w/o (07/05/18): postop changes with development of hematoma at tumor bed and 8 mm of midline shift  -- Pain/spasm control: Tylenol PRN, Motrin PRN  -- Diet: NPO + Osmolite TF  -- Bowel regimen held, last BM 08/01/18  -- Abx: none  -- Activity restrictions   -- Craniectomy precautions, helmet to be worn at all times when OOB  -- PT/OT: SNF vs LTAC  -- DVT ppx: Lovenox, SCDs/Venodynes  -- Consults:    NCCU/Neurology   -- Keppra1500 mg 2x/day   -- Vimpat 200 mg 2x/day   -- Dilantin 100 mg q8h; continue current dose despite being non-therapeutic (07/31/18)   -- Clobazam 20 mg q8h   -- FU in 4 weeks with Dr. Shelton Silvas   -- Signed off   Trauma - constipation   -- Trach/PEG completed 07/15/18   -- Tracheostomy plate sutures removed 07/23/18; stay sutures removed and place for 2-week follow-up (order placed)   -- Aggressive bowel regimen to alleviate stool burden; resume tube feeds with return of bowel function  -- Lines/Drains: Trach/PEG  -- Wound: L fronto-temporal incision (staples removed), JP drain Rapide, LD Rapide   -- Please bathe patient daily and clean wound daily    -- Okay for soap/shampoo   -- Apply bacitracin ointment BID  -- Disposition: ongoing, awaiting placement   -- Will need follow-up CT-venogram to assess non-occlusive thrombus in transverse sinus    Azeem A. Laural Golden, M.D.  PGY-4 Neurosurgery  08/01/2018, 14:28        I saw and examined the patient.  I reviewed the resident's note.  I agree with the findings and plan of care as documented in the resident's note.  Any exceptions/additions are edited/noted.  Brain Honeycutt, MD

## 2018-07-30 NOTE — Care Plan (Signed)
Problem: Adult Inpatient Plan of Care  Goal: Plan of Care Review  Outcome: Ongoing (see interventions/notes)  Goal: Patient-Specific Goal (Individualization)  Outcome: Ongoing (see interventions/notes)  Goal: Absence of Hospital-Acquired Illness or Injury  Outcome: Ongoing (see interventions/notes)  Goal: Optimal Comfort and Wellbeing  Outcome: Ongoing (see interventions/notes)  Goal: Rounds/Family Conference  Outcome: Ongoing (see interventions/notes)     Problem: Fall Injury Risk  Goal: Absence of Fall and Fall-Related Injury  Outcome: Ongoing (see interventions/notes)     Problem: Skin Injury Risk Increased  Goal: Skin Health and Integrity  Outcome: Ongoing (see interventions/notes)     Problem: Hypertension Comorbidity  Goal: Blood Pressure in Desired Range  Outcome: Ongoing (see interventions/notes)     Problem: Depression  Goal: Improved Mood  Outcome: Ongoing (see interventions/notes)     Problem: Feeding Intolerance (Enteral Nutrition)  Goal: Feeding Tolerance  Outcome: Ongoing (see interventions/notes)     Problem: Bleeding (Craniotomy/Craniectomy/Cranioplasty)  Goal: Absence of Bleeding  Outcome: Ongoing (see interventions/notes)     Problem: Cerebral Tissue Perfusion Risk (Craniotomy/Craniectomy/Cranioplasty)  Goal: Effective Cerebral Tissue Perfusion  Outcome: Ongoing (see interventions/notes)     Problem: Fluid Imbalance (Craniotomy/Craniectomy/Cranioplasty)  Goal: Fluid Balance  Outcome: Ongoing (see interventions/notes)     Problem: Infection (Craniotomy/Craniectomy/Cranioplasty)  Goal: Absence of Infection Signs/Symptoms  Outcome: Ongoing (see interventions/notes)     Problem: Pain (Craniotomy/Craniectomy/Cranioplasty)  Goal: Acceptable Pain Control  Outcome: Ongoing (see interventions/notes)     Problem: Bleeding (Surgery Nonspecified)  Goal: Absence of Bleeding  Outcome: Ongoing (see interventions/notes)     Problem: Bowel Elimination Impaired (Surgery Nonspecified)  Goal: Effective Bowel  Elimination  Outcome: Ongoing (see interventions/notes)     Problem: Infection (Surgery Nonspecified)  Goal: Absence of Infection Signs/Symptoms  Outcome: Ongoing (see interventions/notes)     Problem: Pain (Surgery Nonspecified)  Goal: Acceptable Pain Control  Outcome: Ongoing (see interventions/notes)     Problem: Seizure, Active Management  Goal: Absence of Seizure/Seizure-Related Injury  Outcome: Ongoing (see interventions/notes)   Notes: patient is unconscious, withdraw from pain. She had PEG tube and tracheostomy placed, the inner cannula and dressing of trach were changed during the shift. She is incontinent and due to have bowel movement. One dose of Dulcolax was given through rectal. Border dressing were applied on both heels and sacrum. Will keep monitoring.

## 2018-07-30 NOTE — Consults (Signed)
[facility]  Neurology Initial Consult    [place], [place], 58 y.o. female  Date of Admission:  07/05/2018  Date of Birth:  August 23, 1960    PCP: Isac Caddy, MD    Information obtained from: history reviewed via medical record. Patient unresponsive on evaluation.  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  The patient was followed by neurology while in [facility] and was started on AEDs on 07/15/18 for clinical seizures, and sub-clinical seizures on vEEG.  Neurology was consulted today now that patient is no longer in [facility] and is with persistent lethargy, and with need for continued management of multiple AEDs.      Overall trend of improvement in vEEG  Med with most recent increase was Onfi.    Current AEDs  -- levetiracetam (Keppra)1500 mg 2x/day (started 12/24)  -- Lacosamide (Vimpat) 200 mg 2x/day (started 12/26)  -- phenytoin (Dilantin) 100 mg q8h (started 12/28)  -- Clobazam (Onfi) 20 mg q8h (started 12/27)    vEEG 07/24/18 -   INTERPRETATION:  Abnormal long-term EEG due to:  1. Left hemispheric sharp waves and LPDs maximally 1.5 Hz, state dependent, indicating highly increased epileptogenic potential in the left hemisphere.  2. Right hemispheric spikes indicating locally increased epileptogenic potential.  3. Left hemispheric slowing indicating left hemispheric dysfunction.  4. Diffuse slowing indicating  nonspecific encephalopathy.    Routine EEG 07/18/18 -   INTERPRETATION:  Abnormal inpatient EEG due to:  1.           Occasional brief runs of generalized periodic discharges at 1 Hz suggesting diffuse cortical hyperexcitability.  2.           Moderate generalized slowing suggesting underlying diffuse or multifocal dysfunction.    vEEG 07/12/18 -   INTERPRETATION:  This is an abnormal inpatient continuous video EEG due to   1. Left hemispheric LPD's, 2-3 Hz and fluctuating, indicating highly-increased epileptogenic potential and likely ongoing seizure. This improves significantly with medications  2. Slowing over the left hemisphere indicating left hemispheric dysfunction  3. Diffuse slowing indicating nonspecific encephalopathy    The patient's medical history includes tobacco use disorder, hypertension, hyperlipidemia, and reflux.    Admission Source:  Home  Advance Directives:  None-Discussed  Hospice involvement prior to admission?  Not applicable    Location (of pain): Quality (character of pain) Severity (minimal, mild, severe, scale or 1-10) Duration (how long has pain/sx present) Timing (when does pain/sx occur)  Context (activity at/before onset) Modifying Factors (what makes pain/sx  Better/worse) Associate Sign/Sx (what accompanies main pain/sx)    Past Medical History:   Diagnosis Date   . Anxiety    . Arthropathy    . Cancer (CMS HCC)     brain tumor   . Dyspnea on exertion    . Esophageal reflux     does not take meds   . Heart murmur     benign   . HTN (hypertension)    . Hyperlipemia    . Muscle  weakness     right sided weakness   . Palpitations    . Shortness of breath    . Wears glasses     reading         Past Surgical History:   Procedure Laterality Date   . HX APPENDECTOMY     . HX BREAST AUGMENTATION Bilateral    . HX LAP CHOLECYSTECTOMY     . HX LUMBAR DISKECTOMY  1999   . HX OTHER Right     benign tumor removed from upper right leg x3   . HX OTHER      exploratory lap   . HX TUBAL LIGATION             Medications Prior to Admission     Prescriptions    acetaminophen (TYLENOL) 325 mg Oral Tablet    Take 2 Tabs (650 mg total) by mouth Every 6 hours as needed for Pain    diphenoxylate-atropine (LOMOTIL) 2.5-0.025 mg Oral Tablet    Once per day as needed    metoprolol tartrate (LOPRESSOR) 25 mg Oral Tablet    Take 25 mg by mouth Twice daily    ondansetron (ZOFRAN) 8 mg Oral Tablet    Take 8 mg by mouth Twice per day as needed    quinapril (ACCUPRIL) 40 mg Oral Tablet    Take 40 mg by mouth Once a day        No Known Allergies  Social History     Tobacco Use   . Smoking status: Current Every Day Smoker     Packs/day: 1.00     Years: 30.00     Pack years: 30.00     Types: Cigarettes   . Smokeless tobacco: Never Used   . Tobacco comment: down to 8 cigarettes, counseled on 1-800-QUIT_NOW   Substance Use Topics   . Alcohol use: Never     Frequency: Never     Family Medical History:     Problem Relation (Age of Onset)    Ehlers-Danlos syndrome Daughter    Heart Attack Mother    Lymphoma Mother          ROS: Review of systems was not obtained due to patient unresponsiveness. .    Exam:  Temperature: 37.6 C (99.7 F)  Heart Rate: 94  BP (Non-Invasive): 136/83  Respiratory Rate: 18  SpO2: 95 %  Pain Score (Numeric, Faces): Other  General: appears chronically ill and acutely ill  HENT:Mouth mucous membranes moist. Face symmetric. Pupils equal and small.  Neck: supple, symmetrical, trachea midline  Respiratory: ATC collar in place. No increased work of breathing.  Cardiovascular:    regular rate. no peripheral edema.  Gastrointestinal: Soft, non-tender, non-distended  Genitourinary: Deferred  Psychiatric: unable to assess  Musculoskeletal: unable to assess   Glasgow:  Eye opening:  1 no response, Verbal response:  1 no response, Best motor response:  2 withdraws to painful stimuli  Mental status:  Level of Consciousness: trace withdrawal of LUE and BLE to painful stimuli  Orientations: Non  responsive  MemoryRegistration, Recall, and Following of commands is nonresponsive  Attentions nonresponsive  Knowledge: nonresponsive  Language: nonresponsive  Speech: nonresponsive  Cranial nerves:   CN2: unable to assess  CN 3,4,6: pupils equal and round, does not respond to threat reflex  CN 5: unable to assess  CN 7: Face symmetrical, facial grimace with pain  CN 8: unable to assess  CN 9,10: unable to assess  CN 11: unable to  assess  CN 12: unable to assess  Gait, Coordination, and Reflexes:   Gait: Normal and unable to assess. Reason: nonresponsive  Coordination: unable to assess    Muscle tone: flaccid  Muscle exam - unable to assess due to mental status  Arm Right Left Leg Right Left   Deltoid   Iliopsoas     Biceps   Quads     Triceps   Hamstrings     Wrist Extension   Ankle Dorsi Flexion     Wrist Flexion   Ankle Plantar Flexion     Interossei   Ankle Eversion     APB   Ankle Inversion         Reflexes   RJ BJ TJ KJ AJ Plantars Hoffman's   Right  2+ 0+ 0+ 0+ Not present Not present   Left  0+ 0+ 0+ 0+ Not present Not present     Sensory: Unable to assess  Integumentary: Skin warm and dry  Diabetes Monitors:  Patient not a diabetic.    Labs:    I have reviewed all lab results.    Review of reports and notes reveal:   vEEG interpretation per above    Independent Interpretation of images or specimens:      CT VENOGRAM HEAD W/WO IV CONTRAST performed on 07/20/2018 11:10 PM  IMPRESSION:  1.  Redemonstration of a subocclusive left transverse sinus thrombus.  2.  Otherwise unchanged appearance of postoperative changes from left  tentorial meningioma resection and left frontoparietotemporal craniectomy.      MRI BRAIN W/WO CONTRAST  07/20/2018 11:27 AM   IMPRESSION:    1. Postsurgical changes related to recent left tentorial meningioma  resection as described above. Subacute blood products at the surgical site,  within the left midbrain and thalamus and extending into the fourth  ventricle, with decreased edema  and mass effect compared to previous MRI  dated 07/06/2018.  2. Small left frontal subdural hematoma.  3. Possible filling defect in the left transverse sinus may reflect  subocclusive thrombus. CT venogram is recommended for better  Characterization.        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  Neurology is consulted for continued management of AEDs that were started for multiple clinical seizures and subclinical seizures on vEEG.      Active Hospital Problems    Diagnosis   . 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 Tobacco use disorder   . 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 Will determine if patient may need continuous based on results.  - Phenytoin level  - Recommend keeping AEDs the same at this time due to history of subclinical seizures.  If it is recommended to withdraw any AEDs the patient will need to be monitored on vEEG.  - Continue PT/OT      DNR  Status this admission:  Full Code  Palliative/Supportive Care consulted?  no  Hospice Consulted?  No    Current Comorbid Conditions - Neurology Consult  Compression of the Brain:  yes  Coma (GCS less than 8):Coma, other cause  TIA not applicable  Encephalopathy:  yes  Encephalitis-Not applicable  Seizure-Unspecified   Respiratory Failure/Other-Not applicable.  Previously intubated and ventilated; now on continuous ATC  No  Coagulopathy Hypercoagulable State-Secondary    Cancer. Note subocclusive left transverse sinus thrombus on CT venogram 07/20/18. On aspirin.    DVT/PE Prophylaxis: Enoxaparin    Jamal Collin, DO  07/30/2018, 15:11  [facility], PGY-3       I saw and examined the patient.  I reviewed the resident's note.  I agree with the findings and plan of care as documented in the resident's note.  Any exceptions/additions are edited/noted.    Per family, patient was more alert 2-3 days ago. Would follow commands more easily. Now it requires more forceful pressure to get patient to withdraw.     Routine EEG noted GPEDs. Started veeg to evaluate for subclinical seizures.    Gemma Payor, MD

## 2018-07-31 ENCOUNTER — Inpatient Hospital Stay (HOSPITAL_COMMUNITY): Payer: PPO | Admitting: Radiology

## 2018-07-31 DIAGNOSIS — Z9889 Other specified postprocedural states: Secondary | ICD-10-CM

## 2018-07-31 DIAGNOSIS — D329 Benign neoplasm of meninges, unspecified: Secondary | ICD-10-CM

## 2018-07-31 LAB — POC BLOOD GLUCOSE (RESULTS)
GLUCOSE, POC: 107 mg/dL — ABNORMAL HIGH (ref 70–105)
GLUCOSE, POC: 113 mg/dL — ABNORMAL HIGH (ref 70–105)
GLUCOSE, POC: 113 mg/dl — ABNORMAL HIGH (ref 70–105)
GLUCOSE, POC: 102 mg/dL (ref 70–105)
GLUCOSE, POC: 128 mg/dL — ABNORMAL HIGH (ref 70–105)
GLUCOSE, POC: 128 mg/dl — ABNORMAL HIGH (ref 70–105)
GLUCOSE, POC: 125 mg/dL — ABNORMAL HIGH (ref 70–105)

## 2018-07-31 LAB — PHENYTOIN, FREE: FREE PHENYTOIN LEVEL: <0.5 ug/mL — ABNORMAL LOW (ref 1.0–2.0)

## 2018-07-31 MED ADMIN — chlorhexidine gluconate 0.12 % mouthwash: ORAL | @ 09:00:00

## 2018-07-31 MED ADMIN — ascorbic acid (vitamin C) 500 mg tablet: GASTROSTOMY | @ 20:00:00

## 2018-07-31 MED ADMIN — sodium chloride 0.9 % intravenous solution: ORAL | @ 20:00:00

## 2018-07-31 MED ADMIN — bacitracin 500 unit/gram topical ointment: TOPICAL | @ 09:00:00

## 2018-07-31 MED ADMIN — ascorbic acid (vitamin C) 500 mg tablet: GASTROSTOMY | @ 09:00:00

## 2018-07-31 MED ADMIN — levETIRAcetam 500 mg/100 mL in sodium chloride (iso-osm) IV piggyback: SUBCUTANEOUS | @ 15:00:00

## 2018-07-31 NOTE — Procedures (Signed)
NAMECARISSA, MUSICK   HOSPITAL NUMBER:  [id number]  DATE OF SERVICE:  07/30/2018  DOB:  1961-05-29  SEX:  F      Date of Study:  July 30, 2018.   Technician:  Amparo Bristol.  EEG #: [id number].    INTERPRETATION:  This is an abnormal EEG due to the presence of bihemispheric slowing more prominent on the left, bihemispheric sharp waves, again more prominent on the left, and excessive beta activity.    REPORT:  This is a digitally acquired video EEG which runs between 6:31 p.m. on July 30, 2018, until 9:00 a.m. on July 31, 2018.  This time period recorded the following.    CLINICAL EVENTS:  There are no pushbutton events recorded.    INTERICTAL ABNORMALITIES:  Throughout the study, there is prominent hemispheric asymmetry with higher amplitude activity seen over the left.  There are epileptiform sharp waves occurring in a pseudoperiodic fashion throughout.  In the left temporal head regions, these have a frequency of approximately 1 Hz.  There are also independent sharp waves seen over the right hemisphere but these do not have the frequency or periodicity noted in the left.  There are frequent runs of beta activity interspersed throughout the study and bihemispheric slowing.    CLINICAL CORRELATION:  The EEG described above is suggestive of areas of bilateral epileptogenic potential.  The left hemisphere seems to be particularly prone to epileptogenic potential, but no clear ictal activity is noted.        [name], MD  Assistant Professor   [facility]          DD:  07/31/2018 09:03:32  DT:  07/31/2018 10:38:17 CB  D#:  [id number]

## 2018-07-31 NOTE — Procedures (Signed)
[name], [name]   HOSPITAL NUMBER:  [id number]  DATE OF SERVICE:  07/30/2018  DOB:  01-01-1961  SEX:  F      Date of study:  July 30, 2018.     Technician:  Velora Mediate.  EEG #:  [id number].    STATUS:  This is an inpatient study..    INTERPRETATION:  This is an abnormal EEG due to the presence of hemispheric asymmetry with slowing on the left, bihemispheric sharp waves and excessive beta activity.    REPORT:  This is a digitally acquired video EEG which runs between 1520 and 1603 for a total of 43 minutes.  Throughout the study, there is bihemispheric slowing and prominent hemispheric asymmetry with higher amplitude activity seen over the left.  In addition to this, there are epileptiform sharp waves seen over the left temporal head regions occurring in a pseudoperiodic fashion.  There are also independent sharp waves seen over the right hemisphere but these do not have the frequency as compared to the left.  There are runs of excessive beta activity interspersed throughout.    CLINICAL CORRELATION:  The EEG described above is suggestive of nonspecific encephalopathy with a more focal area of neuronal dysfunction in the left hemisphere.  There are bilateral areas of epileptogenic potential more prominent on the left but no clear electrographic seizures are recorded.        [name], MD  Assistant Professor   [facility]          DD:  07/31/2018 09:10:55  DT:  07/31/2018 10:41:18 JB  D#:  [id number]

## 2018-07-31 NOTE — Care Management Notes (Signed)
[facility]  Care Management Note    Patient Name: [name]  Date of Birth: Dec 11, 1960  Sex: female  Date/Time of Admission: 07/05/2018  7:26 AM  Room/Bed: 1078/B  Payor: HEALTH PLAN / Plan: HEALTH PLAN (PPO-NON ST EMP) / Product Type: PPO /    LOS: 26 days   Primary Care Providers:  Isac Caddy, MD, MD (General)    Admitting Diagnosis:  Meningioma (CMS [facility]) [D32.9]    Assessment:      07/31/18 3300   Patient Hand-Off   Clinical/Discharge Plan of Care Information Communicated to:  Clinical Care Coordinator   Comments Mikle Bosworth, [facility] - [id number]     Discharge Plan:  LTAC (code 57)  Verbal handoff given to [place] who will be assuming care of patient.    The patient will continue to be evaluated for developing discharge needs.     Case Manager: Suanne Marker, [place] COORDINATOR  Phone: [phone] for [place]

## 2018-07-31 NOTE — Consults (Signed)
[facility]  Neurology Consult Follow Up    [name], [name], 58 y.o. female  Date of Service: 07/31/2018  Date of Birth:  [birth year]    Hospital Day:  LOS: 26 days       Chief Complaint:  Assessment of AEDs as possible cause of encephalopathy  Subjective: Patient without any acute overnight events.  Patient remains unresponsive and does not follow commands; does withdraw to pain.   Patient with trach, and PEG tube.     Objective:  Temperature: 36.8 C (98.3 F)  Heart Rate: 94  BP (Non-Invasive): 114/68  Respiratory Rate: (!) 24  SpO2: 98 %  Pain Score (Numeric, Faces): Other  Glasgow: Eye opening:  1 no response, Verbal response:  1 no response, Best motor response:  4 flexion withdrawal  General:responds to painful stimuli  Mental status:non responsive  Memory: Unable to assess due to mental status  Attention: Unable to assess due to mental status  Knowledge: Unable to assess due to mental status  Language and Speech: Unable to assess due to mental status  Cranial nerves: PERRL, face symmetric  Muscle tone: unable to assess  Motor strength:  Unable to assess  Sensory: unable to assess  Gait: Normal and unable to assess. Reason: Unable to assess due to mental status  Coordination: Unable to assess due to mental status  Reflexes: 0 throughout except right BJ 2+  Diabetes Monitors:  Patient not a diabetic.    Current Comorbid Conditions - Neurology Consult  Compression of the Brain:  yes  Coma (GCS less than 8):Coma, other cause  TIA not applicable  Encephalopathy:  yes  Encephalitis-Not applicable  Seizure-Unspecified   Respiratory Failure/Other-Not applicable. Previously intubated and ventilated; now on continuous ATC  No  Coagulopathy Hypercoagulable State-Secondary    Cancer. Note subocclusive left transverse sinus thrombus on CT venogram 07/20/18. On aspirin.    Labs:  I have reviewed all lab results.    Review of reports and notes reveal:   Independent Interpretation of images or specimens:  CT  VENOGRAM HEAD W/WO IV CONTRAST performed on 07/20/2018 11:10 PM  IMPRESSION:  1. Redemonstration of a subocclusive left transverse sinus thrombus.  2. Otherwise unchanged appearance of postoperative changes from left  tentorial meningioma resection and left frontoparietotemporal craniectomy.      MRI BRAIN W/WO CONTRAST  07/20/2018 11:27 AM   IMPRESSION:    1. Postsurgical changes related to recent left tentorial meningioma  resection as described above. Subacute blood products at the surgical site,  within the left midbrain and thalamus and extending into the fourth  ventricle, with decreased edema and mass effect compared to previous MRI  dated 07/06/2018.  2. Small left frontal subdural hematoma.  3. Possible filling defect in the left transverse sinus may reflect  subocclusive thrombus. CT venogram is recommended for better  Characterization.    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  Neurology is consulted for evaluation of multiple of AEDs that were started for multiple clinical seizures and subclinical seizures on vEEG as possible cause of encephalopathy.    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   - VEEG without seizures  - Phenytoin level low; therefore, unlikely cause of encephalopathy  - Recommend keeping AEDs the same given control of  seizures/no seizures demonstrated on vEEG.  Unlikely AEDs cause of current encephalopathy.   -- levetiracetam (Keppra)1500 mg 2x/day   -- Lacosamide (Vimpat) 200 mg 2x/day   -- phenytoin (Dilantin) 100 mg q8h   -- Clobazam (Onfi) 20 mg q8h   - Patient will require outpatient follow-up with neurology Dr. Alfredia [name] 4 weeks after hospital discharge (we have already placed this order)  - Continue PT/OT    We will sign-off at this time. For further questions page the neurology 1 service.    [name] Collin, DO  07/31/2018, 11:57  [facility], PGY-3     I saw and examined the patient.  I reviewed the resident's note.  I agree with the findings and plan of care as documented in the resident's note.  Any exceptions/additions are edited/noted.    [name] Payor, MD

## 2018-07-31 NOTE — Care Plan (Signed)
Patient vitally stable over night. Requiring frequent oral suctioning. Patient unresponsive and does not follow commands. Withdraws only to pain x4. Pt left leg limb alert for DVT. Trach in place and secure. PEG tube in place. PRN tylenol given for low grade fever. Tolerating tube feeds well.

## 2018-08-01 LAB — CBC WITH DIFF
BASOPHIL #: <0.1 x10ˆ3/uL (ref <=0.20)
BASOPHIL %: 1 %
EOSINOPHIL #: 0.27 x10ˆ3/uL (ref <=0.50)
EOSINOPHIL %: 4 %
HCT: 28.4 % — ABNORMAL LOW (ref 34.8–46.0)
HGB: 8.7 g/dL — ABNORMAL LOW (ref 11.5–16.0)
IMMATURE GRANULOCYTE #: <0.1 x10ˆ3/uL (ref <0.10)
IMMATURE GRANULOCYTE %: 1 % (ref 0–1)
LYMPHOCYTE #: 1.31 x10ˆ3/uL (ref 1.00–4.80)
LYMPHOCYTE %: 18 %
MCH: 28.6 pg (ref 26.0–32.0)
MCHC: 30.6 g/dL — ABNORMAL LOW (ref 31.0–35.5)
MCV: 93.4 fL (ref 78.0–100.0)
MONOCYTE #: 0.67 x10ˆ3/uL (ref 0.20–1.10)
MONOCYTE %: 9 %
MPV: 9.5 fL (ref 8.7–12.5)
NEUTROPHIL #: 4.93 x10ˆ3/uL (ref 1.50–7.70)
NEUTROPHIL %: 67 %
PLATELETS: 336 x10ˆ3/uL (ref 150–400)
RBC: 3.04 x10ˆ6/uL — ABNORMAL LOW (ref 3.85–5.22)
RDW-CV: 17.2 % — ABNORMAL HIGH (ref 11.5–15.5)
WBC: 7.3 x10ˆ3/uL (ref 3.7–11.0)

## 2018-08-01 LAB — POC BLOOD GLUCOSE (RESULTS)
GLUCOSE, POC: 152 mg/dL — ABNORMAL HIGH (ref 70–105)
GLUCOSE, POC: 100 mg/dL (ref 70–105)
GLUCOSE, POC: 102 mg/dL (ref 70–105)
GLUCOSE, POC: 79 mg/dL (ref 70–105)
GLUCOSE, POC: 116 mg/dL — ABNORMAL HIGH (ref 70–105)

## 2018-08-01 MED ADMIN — phenytoin 100 mg/4 mL oral suspension: GASTROSTOMY | @ 14:00:00

## 2018-08-01 MED ADMIN — chlorhexidine gluconate 0.12 % mouthwash: ORAL | @ 20:00:00

## 2018-08-01 MED ADMIN — potassium chloride 20 mEq/L in D5-0.9 % sodium chloride intravenous: GASTROSTOMY | @ 08:00:00 | NDC 00338080304

## 2018-08-01 MED ADMIN — lactated Ringers intravenous solution: GASTROSTOMY | @ 08:00:00 | NDC 00338011704

## 2018-08-01 NOTE — Care Plan (Signed)
[facility]  Physical Therapy Treatment    Patient Name: [name]  Date of Birth: November 20, 1960  Height: Height: 167.6 cm (5' 5.98")  Weight: Weight: 89.8 kg (197 lb 15.6 oz)  Room/Bed: 1078/B  Payor: HEALTH PLAN / Plan: HEALTH PLAN (PPO-NON ST EMP) / Product Type: PPO /       Assessment:      (P) [name] continues to demonstrate maintainance of PROM all major joints with intact muscle lengths and no soft tissue disruption. She remains minimally responsive to stimuli and vitals were stable with no indication for suctioning during this session.    Discharge Needs:    Equipment Recommendation: (P) TBD     Discharge Disposition: (P) long term acute care facility    JUSTIFICATION OF DISCHARGE RECOMMENDATION   Based on current diagnosis, functional performance prior to admission, and current functional performance, this patient requires continued PT services in (P) long term acute care facility in order to achieve significant functional improvements in these deficit areas: (P) aerobic capacity/endurance, arousal, attention, and cognition, gait, locomotion, and balance, neuromuscular, ventilation and respiration/gas exchange.      Plan:   Current Intervention:    To provide physical therapy services (P) minimum of 2x/week  for duration of (P) until discharge.    The risks/benefits of therapy have been discussed with the patient/caregiver and he/she is in agreement with the established plan of care.       Past Medical History:   Diagnosis Date   . Anxiety    . Arthropathy    . Cancer (CMS HCC)     brain tumor   . Dyspnea on exertion    . Esophageal reflux     does not take meds   . Heart murmur     benign   . HTN (hypertension)    . Hyperlipemia    . Muscle weakness     right sided weakness   . Palpitations    . Shortness of breath    . Wears glasses     reading         Past Surgical History:   Procedure Laterality Date   . HX APPENDECTOMY     . HX BREAST AUGMENTATION  Bilateral    . HX LAP CHOLECYSTECTOMY     . HX LUMBAR DISKECTOMY  1999   . HX OTHER Right     benign tumor removed from upper right leg x3   . HX OTHER      exploratory lap   . HX TUBAL LIGATION            reports that she has been smoking cigarettes. She has a 30.00 pack-year smoking history. She has never used smokeless tobacco. She reports that she does not drink alcohol or use drugs.  Social History     Tobacco Use   Smoking Status Current Every Day Smoker   . Packs/day: 1.00   . Years: 30.00   . Pack years: 30.00   . Types: Cigarettes   Smokeless Tobacco Never Used   Tobacco Comment    down to 8 cigarettes, counseled on 1-800-QUIT_NOW         Subjective & Objective        08/01/18 1544   Therapist Pager   PT Assigned/ Pager # [phone]   Rehab Session   Document Type therapy progress note (daily note)   Total PT Minutes: 32   Patient Effort poor   General Information  Medical Lines PEG Tube;PIV Line;Telemetry   Respiratory Status aerosol trach collar   Existing Precautions/Restrictions aspiration precautions;fall precautions;full code;other (see comments)  (helmet on when up)   Mutuality/Individual Preferences   Anxieties, Fears or Concerns Family arrived at session end and discussed concerns that they may not find a a good quality facilty to have patient transferred to    [place] and lateral support, helmet   Pre Treatment Status   Pre Treatment Patient Status Patient supine in bed;Sitter select activated   Support Present Pre Treatment  None   Communication Pre Treatment  Nurse   Vital Signs   Pre-Treatment Heart Rate (beats/min) 94   Post-treatment Heart Rate (beats/min) 92   Pre Treatment BP 93/62   Pre SpO2 (%) 94   O2 Delivery Pre Treatment supplemental O2   Post SpO2 (%) 96   O2 Delivery Post Treatment supplemental O2   Pain Assessment   Pre/Post Treatment Pain Comment No behaviors suggest pain   Vision Assessment/Interventions   Visual Impairment/Limitations   (eyes closed  t/o)   Therapeutic Exercise/Activity   Comment [name] demonstrated slightly increaed tone x 4 limbs as compared to last session ( still low tone t/o limbs) and minimal response to interaction , although does demonstrate localized response to facial stimulation, especially peri-oral. PROM in diverse planes x 4 limbs with end range sustained stretches, non-cardinal plane movements, and a great deal of patient's hands to face and limbs . At session end with arrival of daughters, therapists listened to and validated their concerns   Post Treatment Status   Post Treatment Patient Status Patient supine in bed;Sitter select activated   Support Present Post Treatment  Family present   Communication Post Treatement Care Management   Plan of Care Review   Plan Of Care Reviewed With patient;daughter   Basic Mobility Am-PAC/6Clicks Score   Turning in bed without bedrails 1   Lying on back to sitting on edge of flat bed 1   Moving to and from a bed to a chair 1   Standing up from chair 1   Walk in room 1   Climbing 3-5 steps with railing 1   6 Clicks Raw Score total 6   Standardized (t-scale) score 16.59   CMS 0-100% Score 100   CMS Modifier CN   Patient Mobility Goal (Frank) 4- Move to chair 3X/day   Exercise/Activity Level Performed 2- Turned self in bed/ROM (active or passive)   Physical Therapy Clinical Impression   Assessment [name] continues to demonstrate maintainance of PROM all major joints with intact muscle lengths and no soft tissue disruption. She remains minimally responsive to stimuli and vitals were stable with no indication for suctioning during this session.   Criteria for Skilled Therapeutic meets criteria   Pathology/Pathophysiology Noted neuromuscular;other (see comments)  (cognition, somatosensory, vision)   Impairments Found (describe specific impairments) aerobic capacity/endurance;arousal, attention, and cognition;gait, locomotion, and balance;neuromuscular;ventilation and respiration/gas  exchange   Functional Limitations in Following  self-care;home management;work;community/leisure   Disability: Inability to Perform work;community/leisure   Rehab Potential fair, will monitor progress closely   Therapy Frequency minimum of 2x/week   Predicted Duration of Therapy Intervention (days/wks) until discharge   Anticipated Equipment Needs at Discharge (PT) TBD   Anticipated Discharge Disposition long term acute care facility         Therapist:   Cherlyn Labella, PT   Pager #: [phone]

## 2018-08-01 NOTE — Care Plan (Signed)
[facility]  Occupational Therapy Progress Note    Patient Name: [name]  Date of Birth: 23-Mar-1961  Height:  167.6 cm (5' 5.98")  Weight:  89.8 kg (197 lb 15.6 oz)  Room/Bed: 1078/B  Payor: HEALTH PLAN / Plan: HEALTH PLAN (PPO-NON ST EMP) / Product Type: PPO /     Assessment:      [name] tolerated OT treatment session poorly this date. Patient calm, with facial expression with tactile stimulus of patient's hands applied to her face. Session was utilized to address ROM of extremities and sensory stimuli for improved neuroplasticity/connectivity/body awareness. Patient continues to be appropriate for skilled OT in LTACH vs. SNF setting upon discharge.      Discharge Needs:     Equipment Recommendation: to be determined    Discharge Disposition:  skilled nursing facility, long term acute care facility   The above recommendation is based upon the current examination and evaluation performed on this date. As subsequent sessions are completed, recommendations will be updated accordingly.    JUSTIFICATION OF DISCHARGE RECOMMENDATION   Based on current diagnosis, functional performance prior to admission, and current functional performance, this patient requires continued OT services in skilled nursing facility, long term acute care facility in order to achieve significant functional improvements.    Plan:   Continue to follow patient according to established plan of care.  The risks/benefits of therapy have been discussed with the patient/caregiver and he/she is in agreement with the established plan of care.     Subjective & Objective:        08/01/18 1545   Therapist Pager   OT Assigned/ Pager # Gearlean Alf [phone]   Rehab Session   Document Type therapy progress note (daily note)   Total OT Minutes: 32   Patient Effort poor   Symptoms Noted During/After Treatment none   General Information   Patient Profile Reviewed? yes   General Observations of Patient [name] was  calm, minimal arousal. Patient supine in bed with HOB elevated upon arrival in patient room. Patient with minimal arousal with tactile stimulus of hands applied to her face for sensory input. Patient's daughters at bedside towards end of session. Patient seen with professional assist of PT for clustered care.   Medical Lines PEG Tube;PIV Line;Telemetry   Respiratory Status aerosol trach collar   Existing Precautions/Restrictions aspiration precautions;fall precautions;full code   Pre Treatment Status   Pre Treatment Patient Status Patient supine in bed;Call light within reach;Sitter select activated;Nurse approved session;Venodynes in place and activated   Support Present Pre Treatment  None   Communication Pre Treatment  Nurse   Mutuality/Individual Preferences   Anxieties, Fears or Concerns Family concerned that they don't know what decision to make as far as a good facility for patient to discharge to   Individualized Care Needs OOB via [place]; encourage frequent repositioning, ROM of all extremities   Self-Care   Current Activity Tolerance poor   Activity/Exercise/Self-Care Comment End of session was utilized to address questions/concerns from patient's daughter about discharge planning and what resources are out there as far as visiting facilities to determine best placement for patient, etc. Family receptive to education and conversation.   Vital Signs   Pre-Treatment Heart Rate (beats/min) 94   Post-treatment Heart Rate (beats/min) 92   Pre Treatment BP 93/62   Pre SpO2 (%) 94   O2 Delivery Pre Treatment supplemental O2   Post SpO2 (%) 96   O2 Delivery Post Treatment  supplemental O2   Pain Assessment   Pre/Post Treatment Pain Comment Patient with no indication of pain during session.   Coping/Psychosocial   Observed Emotional State withdrawn   Coping/Psychosocial Response Interventions   Plan Of Care Reviewed With patient;daughter   Supportive Measures active listening utilized;counseling  provided;decision-making supported;goal setting facilitated;positive reinforcement provided;self-care encouraged;verbalization of feelings encouraged   Trust Relationship/Rapport care explained;choices provided;questions answered;questions encouraged;reassurance provided;thoughts/feelings acknowledged  (with patient and family)   Mobility Assessment/Training   Mobility Comment Formal mobility not facilitated this date to address ROM and sensory input.   ADL Assessment/Intervention   ADL Comments Patient requires DEP A for ADL tasks at this time.   IADL Evaluation   IADL Comments Anticipate total assistance required for IADL tasks at this time.   Motor Skills/Interventions   Additional Documentation Neuromuscular Re-education (Group);Therapeutic Exercise (Group)   Balance Skill Training   Comment Not formally assessed   Therapeutic Exercise/Activity   Comment Patient tolerated PROM of all extremities, with OT facilitating PROM to RUE/RLE and PT completing PROM of LUE/LLE. Patient was engaged in ROM at all joints and all planes of movement, as well as engaging in PNF patterns of movement, reciprocal movement with BLEs as if riding a bicycle, and shoulder abduction/external rotation to facilitate "big" movements. Patient tolerated PROM well. PROM completed to prevent joint contractures as well as to maintain adequate skin integrity.   Neuromuscular Re-education   Comment, Neuromuscular Re-education Patient tolerated tactile sensory input strategies of placement of B hands on face, stimulating large nerve endings on hands and lips, massage and pressure applied to BUEs for improved neuroconnectivity and body awareness, crossing of midline with BUEs, and tactile stimulus of BUEs to BLEs to faciliate body awareness. Patient tolerated sensory techniques well.   Post Treatment Status   Post Treatment Patient Status Patient supine in bed;Call light within reach;Sitter select activated;Venodynes in place and activated      Support Present Post Treatment  Family present   Communication Post Treatement Care Management   Occupational Therapy Clinical Impression   Functional Level at Time of Session Ms. Jerez tolerated OT treatment session poorly this date. Patient calm, with facial expression with tactile stimulus of patient's hands applied to her face. Session was utilized to address ROM of extremities and sensory stimuli for improved neuroplasticity/connectivity/body awareness. Patient continues to be appropriate for skilled OT in LTACH vs. SNF setting upon discharge.   Anticipated Equipment Needs at Discharge to be determined   Anticipated Discharge Disposition skilled nursing facility;long term acute care facility       Therapist:   Darcus Pester, OT  Pager #: [phone]

## 2018-08-01 NOTE — Care Management Notes (Signed)
[facility]  Care Management Note    Patient Name: [name]  Date of Birth: 1961-01-04  Sex: female  Date/Time of Admission: 07/05/2018  7:26 AM  Room/Bed: 1078/B  Payor: HEALTH PLAN / Plan: HEALTH PLAN (PPO-NON ST EMP) / Product Type: PPO /    LOS: 27 days   Primary Care Providers:  Isac Caddy, MD, MD (General)    Admitting Diagnosis:  Meningioma (CMS [facility]) [D32.9]    Assessment:      08/01/18 1613   Assessment Details   Assessment Type Continued Assessment   Date of Care Management Update 08/01/18   Date of Next DCP Update 08/02/18   Care Management Plan   Discharge Planning Status plan in progress   Projected Discharge Date 08/05/18   CM will evaluate for rehabilitation potential yes   Patient choice offered to patient/family yes   Discharge Needs Assessment   Discharge Facility/Level of Care Needs LTAC (code 55)   Transportation Available ambulance       Discharge Plan:  LTAC (code 54)  Patient remains in step down, stable at this time. Dr. Margart Sickles in to see patient and discussed possible flap replacement as early as next week. I met with family and discussed LTAC options. They would like to speak to Shawn, the liaison for [employer] and would be interested in [facility] if possible. They also would like to know if insurance would cover [facility] in [place]. I called Monica at [facility] and she will check into this. Message left on Shawn's VM, referral task sent. I will meet with family tomorrow to discuss d/c plan further.    The patient will continue to be evaluated for developing discharge needs.     Case Manager: Dyanne Iha, [place] COORDINATOR  Phone: [phone]

## 2018-08-01 NOTE — Care Plan (Signed)
[facility]  Medical Nutrition Therapy Follow Up    SUBJECTIVE:  Attempted to meet with patient at bedside, patient sleeping soundly with no family at bedside.     OBJECTIVE:     Current Diet Order/Nutrition Support:    MNT PROTOCOL FOR DIETITIAN  DIET NPO - NOW EXCEPT TUBE FEEDS  ADULT TUBE FEED - BOLUS Q4H WHILE AWAKE NO MEALS, TF ONLY; OSMOLITE 1.5; NG; INTERMITTENT - INFUSION SHOULD TAKE 30 - 60 MINUTES; Bolus Amount: 1 can at 0500, 0900, 1300, 1700, 2200 + 1 packet of prosource   1860cal = 29cals/kg   90g protein = 1.5g/kg   936ml free water    Height Used for Calculations: 167.6 cm (5' 5.98")  Weight Used For Calculations: 78.2 kg (172 lb 6.4 oz)(standing weight upon admission)  BMI (kg/m2): 27.9  BMI Assessment: BMI 25-29.9: overweight  Ideal Body Weight (IBW) (kg): 59.54  % Ideal Body Weight: 131.34  Adjusted/Standard Body Weight  Adjusted BW: 64 kg          Re-assessed needs if applicable:    Energy Calorie Requirements: 1900-2100(30-33 kcal/64 kg Adj BW)   Protein Requirements (gms/day): 78-90(1.3-1.5 gm/60 kg IBW)   Fluid Requirements: 1900-2100(1 ml/kcal)       Plan/Interventions :  1. Continue Tube feeds of Osmolite 1.5, increase to 5.5 cans/day and D/C prosource    1980 calories =31 calories/kg    83 grams protein = 1.4 g/kg   1007 ml  Hold tube feeds 1 hour before and after Dilantin administration     2. Monitor I/Os for hydration status and feeding tolerance   3. Continue Vitamin C as appropriate, add daily MVI   4. Daily weight     Nutrition Diagnosis:Swallowing difficultyrelated to trach in place, decreased mental statusas evidenced by Need for TF(addendum; in progress)      Tama Gander, MS, RD, LD   08/01/2018, 15:26  Pager [phone]

## 2018-08-01 NOTE — Care Plan (Signed)
Pt remains obtunded and withdrawing to pain. Vital signs stable. Temp down to 98.26f Tolerating tube feedings well. Pt turned and repositioned q 2hr. Oral and tracheostomy suctioned done frequently for secretions. Trach care complete. SS in place for safety. Will continue to monitor.       [facility] Highest Level of Mobility Goal      Date: 08/01/18     JH-HLM Goal: 2    Exercise Level: 1- Lying in bed    Goal Outcome: Not met

## 2018-08-01 NOTE — Care Plan (Signed)
Patient is unresponsive and withdraws from pain. Patient cleared most secretions only suction four times today. Patient got up to the chair x2 and the maxi-move. Patient tolerated tube feeds. Patient turned Q2 to maintain skin integrity.       Problem: Adult Inpatient Plan of Care  Goal: Plan of Care Review  Outcome: Ongoing (see interventions/notes)  Goal: Patient-Specific Goal (Individualization)  Outcome: Ongoing (see interventions/notes)  Flowsheets (Taken 08/01/2018 1754)  Patient-Specific Goals (Include Timeframe): Family doesn't know if patient will stay until bone flap is reinserted before being discharged to LTAC  Goal: Absence of Hospital-Acquired Illness or Injury  Outcome: Ongoing (see interventions/notes)  Goal: Optimal Comfort and Wellbeing  Outcome: Ongoing (see interventions/notes)  Goal: Rounds/Family Conference  Outcome: Ongoing (see interventions/notes)

## 2018-08-02 LAB — MAGNESIUM: MAGNESIUM: 2 mg/dL (ref 1.6–2.6)

## 2018-08-02 LAB — BASIC METABOLIC PANEL
ANION GAP: 9 mmol/L (ref 4–13)
BUN/CREA RATIO: 37 — ABNORMAL HIGH (ref 6–22)
BUN: 21 mg/dL (ref 8–25)
CALCIUM: 9.2 mg/dL (ref 8.5–10.2)
CHLORIDE: 103 mmol/L (ref 96–111)
CO2 TOTAL: 25 mmol/L (ref 22–32)
CREATININE: 0.57 mg/dL (ref 0.49–1.10)
ESTIMATED GFR: >60 mL/min/1.73mˆ2 (ref >60)
GLUCOSE: 172 mg/dL — ABNORMAL HIGH (ref 65–139)
POTASSIUM: 3.6 mmol/L (ref 3.5–5.1)
SODIUM: 137 mmol/L (ref 136–145)

## 2018-08-02 LAB — POC BLOOD GLUCOSE (RESULTS)
GLUCOSE, POC: 105 mg/dL (ref 70–105)
GLUCOSE, POC: 113 mg/dL — ABNORMAL HIGH (ref 70–105)
GLUCOSE, POC: 89 mg/dL (ref 70–105)
GLUCOSE, POC: 114 mg/dL — ABNORMAL HIGH (ref 70–105)

## 2018-08-02 LAB — PHOSPHORUS: PHOSPHORUS: 3.8 mg/dL (ref 2.4–4.7)

## 2018-08-02 MED ORDER — SILVER SULFADIAZINE 1 % TOPICAL CREAM
TOPICAL_CREAM | Freq: Two times a day (BID) | CUTANEOUS | Status: DC
Start: 2018-08-02 — End: 2018-08-08
  Filled 2018-08-02 (×2): qty 50

## 2018-08-02 MED ADMIN — sodium chloride 0.9 % intravenous solution: TOPICAL | @ 09:00:00

## 2018-08-02 MED ADMIN — phenytoin 100 mg/4 mL oral suspension: GASTROSTOMY | @ 06:00:00

## 2018-08-02 MED ADMIN — nystatin 100,000 unit/gram topical powder: TOPICAL | @ 09:00:00

## 2018-08-02 MED ADMIN — lactated Ringers intravenous solution: GASTROSTOMY | @ 13:00:00

## 2018-08-02 MED ADMIN — HYDROcodone 5 mg-acetaminophen 325 mg tablet: TRANSDERMAL | @ 09:00:00

## 2018-08-02 MED ADMIN — sodium chloride 0.9 % intravenous solution: GASTROSTOMY | @ 20:00:00

## 2018-08-02 NOTE — Care Management Notes (Signed)
[facility]  Care Management Note    Patient Name: [name]  Date of Birth: [birth year]  Sex: female  Date/Time of Admission: 07/05/2018  7:26 AM  Room/Bed: 1078/B  Payor: HEALTH PLAN / Plan: HEALTH PLAN (PPO-NON ST EMP) / Product Type: PPO /    LOS: 28 days   Primary Care Providers:  Isac Caddy, MD, MD (General)    Admitting Diagnosis:  Meningioma (CMS [facility]) [D32.9]    Assessment:      08/02/18 1609   Assessment Details   Assessment Type Continued Assessment   Date of Care Management Update 08/02/18   Date of Next DCP Update 08/05/18   Care Management Plan   Discharge Planning Status plan in progress   Projected Discharge Date 08/05/18   Discharge Needs Assessment   Discharge Facility/Level of Care Needs LTAC (code 70)       Discharge Plan:  LTAC (code 16)  Patient unchanged, in step down, obtunded. Trach to [facility]. Suctioned as needed. Per [facility], unable to accept at [facility] as they are not able to take commercial insurances at this time. I spoke to daughter and she would like me to pursue [facility], [place]. After several calls to and from [place] at [employer], that facility ins not in network. She checked [facility] in [place], found only [facility] site listed as in network, not [facility]. Daughter would like me to send a referral there. I sent task and spoke to [place], liaison. I encouraged daughter to do a site visit over weekend. Will follow to facilitate transfer to LTAC once d/c ready.    The patient will continue to be evaluated for developing discharge needs.     Case Manager: Dyanne Iha, [place] COORDINATOR  Phone: [phone]

## 2018-08-02 NOTE — Progress Notes (Signed)
[facility]  NEUROSURGERY   PROGRESS NOTE      [name], [name], 58 y.o. female  Date of Admission:  07/05/2018  Date of Service: 08/02/2018  Date of Birth:  February 05, 1961    Referring Physician:  No ref. provider found    Post Op Day: 18 Days Post-Op S/P Procedure(s) (LRB):  TRACHEOSTOMY (N/A)  GASTROSCOPY WITH PLACEMENT PEG TUBE (N/A)    Chief Complaint: R tentorial meningioma  Subjective: NAEO    Vital Signs:  Temp (24hrs) Max:37.2 C (16.1 F)      Systolic (09UEA), VWU:981 , Min:93 , XBJ:478     Diastolic (29FAO), ZHY:86, Min:60, Max:74    Temp  Avg: 36.8 C (98.2 F)  Min: 36.3 C (97.3 F)  Max: 37.2 C (98.9 F)  MAP (Non-Invasive)  Avg: 78.5 mmHG  Min: 71 mmHG  Max: 87 mmHG  Pulse  Avg: 95.2  Min: 91  Max: 99  Resp  Avg: 19.7  Min: 18  Max: 22  SpO2  Avg: 95.8 %  Min: 94 %  Max: 99 %  Pain Score (Numeric, Faces): Other    Min/Max/Avg ICP/CPP last 24hrs:   No data recorded    Today's Physical Exam:  GCS 1 5 1  T  -- Eyes do not open to noxious stimuli  -- Localizes in LUE, Withdraws in RUE and BLE  PERRL  Face symmetric  +cough  CN 3 4 6  EOMI Unable to assess  CN 11 shrug symmetric Unable to assess  Muscle Strength Unable to assess  Unable to assess drift  -- L fronto-temporal incision c/d/i, staples removed  -- JP drain Rapide  -- LD Rapide    Current Medications:  acetaminophen (TYLENOL) 160 mg per 5 mL liquid - grape flavored, 650 mg, Gastric (NG, OG, PEG, GT), Q4H PRN  artificial tears ophthalmic solution, 1 Drop, Both Eyes, Q2H PRN  ascorbic acid (VITAMIN C) tablet, 500 mg, Gastric (NG, OG, PEG, GT), 2x/day  aspirin tablet 325 mg, 325 mg, Gastric (NG, OG, PEG, GT), Daily  bacitracin 500 units/gram topical ointment tube, , Topical, 2x/day  chlorhexidine gluconate (PERIDEX) 0.12% mouthwash, 15 mL, Swish & Spit, 2x/day  cloBAZam (ONFI) 2.5 mg per mL oral liquid, 20 mg, Gastric (NG, OG, PEG, GT), Q8HRS  enoxaparin PF (LOVENOX) 40 mg/0.4 mL SubQ injection, 40 mg, Subcutaneous, Q24H  glycopyrrolate  (ROBINUL) 0.2 mg/mL (200 mcg/mL) injection, 200 mcg, Intravenous, Q8H PRN  hydrALAZINE (APRESOLINE) injection 10 mg, 10 mg, Intravenous, Q4H PRN  ibuprofen (MOTRIN) tablet, 400 mg, Gastric (NG, OG, PEG, GT), Q6H PRN  labetalol (TRANDATE) 5 mg/mL injection, 10 mg, Intravenous, Q1H PRN  lacosamide (VIMPAT) tablet, 200 mg, Gastric (NG, OG, PEG, GT), 2x/day  lanolin-oxyquin-pet, hydrophil (BAG BALM) topical ointment, , Apply Topically, 2x/day PRN  levETIRAcetam (KEPPRA) tablet, 1,500 mg, Gastric (NG, OG, PEG, GT), 2x/day  lisinopril (PRINIVIL) tablet, 40 mg, Gastric (NG, OG, PEG, GT), Daily  magnesium citrate (CITROMA) oral liquid, 296 mL, Gastric (NG, OG, PEG, GT), Daily  nicotine (NICODERM CQ) transdermal patch (mg/24 hr), 7 mg, Transdermal, Daily  nutrition protein supplement 15 g per 30 mL packet, 1 Packet, Gastric (NG, OG, PEG, GT), Daily  nystatin (MYCOSTATIN) 100,000 units per mL oral liquid, 5 mL, Swish & Spit, 4x/day  nystatin (NYSTOP) 100,000 units/g topical powder, , Apply Topically, 2x/day  ondansetron (ZOFRAN) 2 mg/mL injection, 4 mg, Intravenous, Q6H PRN  phenytoin (DILANTIN) 100 mg per 4 mL oral liquid, 100 mg, Gastric (NG, OG, PEG, GT), Q8HRS  polyethylene  glycol (MIRALAX) oral packet, 17 g, Gastric (NG, OG, PEG, GT), Daily  SSIP insulin lispro (HUMALOG) 100 units/mL injection, 0-12 Units, Subcutaneous, Q4H PRN        I/O:  I/O last 24 hours:      Intake/Output Summary (Last 24 hours) at 08/02/2018 0339  Last data filed at 08/01/2018 2200  Gross per 24 hour   Intake 1767 ml   Output --   Net 1767 ml     I/O current shift:  01/09 1900 - 01/10 0659  In: 337 [GT:337]  Out: -     Antibiotics: Date Started Date Completed   1.     2.     3.     4.       Nutrition/Residuals:  MNT PROTOCOL FOR DIETITIAN  DIET NPO - NOW EXCEPT TUBE FEEDS  ADULT TUBE FEED - BOLUS Q4H WHILE AWAKE NO MEALS, TF ONLY; OSMOLITE 1.5; NG; INTERMITTENT - INFUSION SHOULD TAKE 30 - 60 MINUTES; Bolus Amount: 1 can at 0500, 0900, 1300, 1700,  2200    Labs  Please indicate ordered or reviewed)  Reviewed:   Lab Results Today:    Results for orders placed or performed during the hospital encounter of 07/05/18 (from the past 24 hour(s))   POC BLOOD GLUCOSE (RESULTS)   Result Value Ref Range    GLUCOSE, POC 152 (H) 70 - 105 mg/dl   POC BLOOD GLUCOSE (RESULTS)   Result Value Ref Range    GLUCOSE, POC 100 70 - 105 mg/dl   POC BLOOD GLUCOSE (RESULTS)   Result Value Ref Range    GLUCOSE, POC 102 70 - 105 mg/dl   POC BLOOD GLUCOSE (RESULTS)   Result Value Ref Range    GLUCOSE, POC 79 70 - 105 mg/dl   POC BLOOD GLUCOSE (RESULTS)   Result Value Ref Range    GLUCOSE, POC 116 (H) 70 - 105 mg/dl   BASIC METABOLIC PANEL   Result Value Ref Range    SODIUM 137 136 - 145 mmol/L    POTASSIUM 3.6 3.5 - 5.1 mmol/L    CHLORIDE 103 96 - 111 mmol/L    CO2 TOTAL 25 22 - 32 mmol/L    ANION GAP 9 4 - 13 mmol/L    CALCIUM 9.2 8.5 - 10.2 mg/dL    GLUCOSE 172 (H) 65 - 139 mg/dL    BUN 21 8 - 25 mg/dL    CREATININE 0.57 0.49 - 1.10 mg/dL    BUN/CREA RATIO 37 (H) 6 - 22    ESTIMATED GFR >60 >60 mL/min/1.23m^2   MAGNESIUM   Result Value Ref Range    MAGNESIUM 2.0 1.6 - 2.6 mg/dL   PHOSPHORUS   Result Value Ref Range    PHOSPHORUS 3.8 2.4 - 4.7 mg/dL   CBC WITH DIFF   Result Value Ref Range    WBC 7.3 3.7 - 11.0 x10^3/uL    RBC 3.04 (L) 3.85 - 5.22 x10^6/uL    HGB 8.7 (L) 11.5 - 16.0 g/dL    HCT 28.4 (L) 34.8 - 46.0 %    MCV 93.4 78.0 - 100.0 fL    MCH 28.6 26.0 - 32.0 pg    MCHC 30.6 (L) 31.0 - 35.5 g/dL    RDW-CV 17.2 (H) 11.5 - 15.5 %    PLATELETS 336 150 - 400 x10^3/uL    MPV 9.5 8.7 - 12.5 fL    NEUTROPHIL % 67 %    LYMPHOCYTE % 18 %  MONOCYTE % 9 %    EOSINOPHIL % 4 %    BASOPHIL % 1 %    NEUTROPHIL # 4.93 1.50 - 7.70 x10^3/uL    LYMPHOCYTE # 1.31 1.00 - 4.80 x10^3/uL    MONOCYTE # 0.67 0.20 - 1.10 x10^3/uL    EOSINOPHIL # 0.27 <=0.50 x10^3/uL    BASOPHIL # <0.10 <=0.20 x10^3/uL    IMMATURE GRANULOCYTE % 1 0 - 1 %    IMMATURE GRANULOCYTE # <0.10 <0.10 x10^3/uL       Assessment/  Plan:  Active Hospital Problems    Diagnosis   . 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 Tobacco use disorder   . 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 POD28/28.  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  -- CTV intra w/wo (07/20/18): sub-occlusive left transverse sinus thrombus  -- US soft tissue axilla (07/20/18): nonspecific area of increased echogenicity seen at the site of concern in the right axilla measuring approximately 6.5 x 2.0 x 8.9 cm; appears nonmass-like with hazy borders and is likely related to cellulitis  -- MRI-brain w/wo (07/20/18): possible non-occlusive thrombus in transverse sinus  -- CT-brain wo (07/19/18): interval decrease of hematoma  -- Venous duplex BLE (07/16/18): left peroneal (calf vein) DVT  -- Venous duplex BUE (07/16/18): no evidence of DVT  -- TTE (07/12/18): EF 75%, no regional wall abnormalities  -- CT brain w/o (07/11/18):stable   -- CT brain w/o (07/09/18 @ 1328):stable  -- CT brain w/o (07/09/18 @ 0430):stable  -- CT brain w/o (07/08/18):postop changes from  decompressive craniectomy with interval mild extracalvarial brain herniation through the craniectomy site; mild increase in cerebral edema at the tumor resection bed and adjacent parenchyma without significant increase in hemorrhage  -- MRI brain w/wo (07/06/18):gross total resection of a left medial tentorial meningioma; hemorrhagic productswithin the resection cavity extending into the parenchyma of the left thalamus and tracking down within the left midbrain and extending to the fourth ventricle  -- CT brain w/o (07/06/18): postop changes from left decompressive craniectomy for evacuation of hematoma of tumor bed with residual hemorrhage and improved edema and rightward midline shift  -- CT brain w/o (07/06/18): postop changes from decompressive craniectomy for evacuation of tumor bed hematoma with minimal residual blood at the surgical site  -- CT brain w/o (07/05/18): postop changes with development of hematoma at tumor bed and 8 mm of midline shift  -- Pain/spasm control: Tylenol PRN, Motrin PRN  -- Diet: NPO + Osmolite TF  -- Bowel regimen held, last BM  08/01/18  -- Abx: none  -- Activity restrictions                -- Craniectomy precautions, helmet to be worn at all times when OOB  -- PT/OT: SNF vs LTAC  -- DVT ppx: Lovenox, SCDs/Venodynes  -- Consults:                 NCCU/Neurology                -- Keppra1500 mg 2x/day                -- Vimpat 200 mg 2x/day                -- Dilantin 100 mg q8h; continue current dose despite being non-therapeutic (07/31/18)                -- Clobazam 20 mg q8h                -- FU in 4 weeks with Dr. Shelton Silvas                -- Signed off                Trauma - constipation                -- Trach/PEG completed 07/15/18                -- Tracheostomy plate sutures removed 07/23/18; stay sutures removed and place for 2-week follow-up (order placed)                -- Aggressive bowel regimen to alleviate stool burden;  resume tube feeds with return of bowel function  -- Lines/Drains: Trach/PEG  -- Wound: L fronto-temporal incision (staples removed), JP drain Rapide, LD Rapide                -- Please bathe patient daily and clean wound daily    -- Okay for soap/shampoo                -- Apply bacitracin ointment BID  -- Disposition:ongoing, awaiting placement                -- Will need follow-up CT-venogram to assess non-occlusive thrombus in transverse sinus    Cletus Cheyuo, MD PhD      I saw and examined the patient.  I reviewed the resident's note.  I agree with the findings and plan of care as documented in the resident's note.  Any exceptions/additions are edited/noted.    [name] Dus, MD

## 2018-08-02 NOTE — Care Plan (Signed)
Patient remains obtunded, movement localizes with LUE, other extremities withdraw. Tube feedings tolerated well. Trach care completed. Turned q2 hrs. Sitter select in place.

## 2018-08-02 NOTE — Care Plan (Signed)
Patient continues to keep eyes closed, does not follow commands, and is non-verbal. Surgical incision to left side of head open to air Silver Sulfadine  applied to incision site. Suctioned #6 Shiley trach x 3 this shift for thin whitish secretions, area cleansed and inner cannula changed. Tolerating Osmolite 1.5 through peg tube, sit is open to air, area cleansed for large amount of old blood drainage. Helmet in room for No Bone flap to left side of head. Patient shows no s/s of pain or discomfort. Sitter select in place and functioning, call bell within reach.     Problem: Adult Inpatient Plan of Care  Goal: Plan of Care Review  Outcome: Ongoing (see interventions/notes)     Problem: Adult Inpatient Plan of Care  Goal: Patient-Specific Goal (Individualization)  Outcome: Ongoing (see interventions/notes)     Problem: Adult Inpatient Plan of Care  Goal: Absence of Hospital-Acquired Illness or Injury  Outcome: Ongoing (see interventions/notes)     Problem: Adult Inpatient Plan of Care  Goal: Optimal Comfort and Wellbeing  Outcome: Ongoing (see interventions/notes)     Problem: Fall Injury Risk  Goal: Absence of Fall and Fall-Related Injury  Outcome: Ongoing (see interventions/notes)     Problem: Skin Injury Risk Increased  Goal: Skin Health and Integrity  Outcome: Ongoing (see interventions/notes)     Problem: Hypertension Comorbidity  Goal: Blood Pressure in Desired Range  Outcome: Ongoing (see interventions/notes)     Problem: Depression  Goal: Improved Mood  Outcome: Ongoing (see interventions/notes)     Problem: Feeding Intolerance (Enteral Nutrition)  Goal: Feeding Tolerance  Outcome: Ongoing (see interventions/notes)     Problem: Infection (Craniotomy/Craniectomy/Cranioplasty)  Goal: Absence of Infection Signs/Symptoms  Outcome: Ongoing (see interventions/notes)     Problem: Pain (Craniotomy/Craniectomy/Cranioplasty)  Goal: Acceptable Pain Control  Outcome: Ongoing (see interventions/notes)     Problem: Seizure,  Active Management  Goal: Absence of Seizure/Seizure-Related Injury  Outcome: Ongoing (see interventions/notes)     Problem: Cerebral Tissue Perfusion Risk (Craniotomy/Craniectomy/Cranioplasty)  Goal: Effective Cerebral Tissue Perfusion  Outcome: Ongoing (see interventions/notes)     [facility] Highest Level of Mobility Goal      Date: 08/02/18     JH-HLM Goal: 2    Exercise Level: 1- Lying in bed    Goal Outcome: Unable to achieve goal.  Related risk factors: Change in Condition/Status

## 2018-08-02 NOTE — Progress Notes (Signed)
[facility]  NEUROSURGERY   PROGRESS NOTE    [name], [name], 58 y.o. female  Date of Admission:  07/05/2018  Date of Service: 08/03/2018  Date of Birth:  02-22-1961    Chief Complaint: right upper and lower extremity weakness    Subjective:  No acute events overnight.    Vital Signs:  Temp (24hrs) Max:36.9 C (49.7 F)      Systolic (02OVZ), CHY:850 , Min:97 , YDX:412     Diastolic (87OMV), EHM:09, Min:44, Max:71    Temp  Avg: 36.8 C (98.2 F)  Min: 36.8 C (98.2 F)  Max: 36.9 C (98.4 F)  MAP (Non-Invasive)  Avg: 75.4 mmHG  Min: 63 mmHG  Max: 84 mmHG  Pulse  Avg: 94.6  Min: 90  Max: 103  Resp  Avg: 16  Min: 16  Max: 16  SpO2  Avg: 94.9 %  Min: 92 %  Max: 99 %  Pain Score (Numeric, Faces): Other    Min/Max/Avg ICP/CPP last 24hrs:   No data recorded    Today's Physical Exam:    Appears acutely ill, intubated  GCS 1 5 1  T  -- Eyes do not open to noxious stimuli  -- Localizes in LUE, Withdraws in RUE and BLE  PERRL  Face symmetric  +cough    Unable to assess hearing  Unable to assess speech  Unable to assess fund of knowledge  Unable to assess attention span & concentration  Unable to assess recent and remote memory  CN 3 4 6  EOMI Unable to assess  CN 11 shrug symmetric Unable to assess  Muscle Strength Unable to assess  Unable to assess drift    -- L fronto-temporal incision c/d/i, staples removed  -- JP drain Rapide  -- LD Rapide    Assessment/Plan:  [name] is a 58 yo F PMH HTN, HLD w/ 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 POD29/29.  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   -- CTV intra w/wo (07/20/18): sub-occlusive left transverse sinus thrombus   -- US soft tissue axilla (07/20/18): nonspecific area of increased echogenicity seen at the site of concern in the right axilla measuring approximately 6.5 x 2.0 x 8.9 cm; appears nonmass-like with hazy borders and is likely related to cellulitis   -- MRI-brain w/wo (07/20/18): possible non-occlusive thrombus in transverse sinus   -- CT-brain wo (07/19/18): interval decrease of hematoma   -- Venous duplex BLE (07/16/18): left peroneal (calf vein) DVT   -- Venous duplex BUE (07/16/18): no evidence of DVT   -- TTE (07/12/18): EF 75%, no regional wall abnormalities   -- CT brain w/o (07/11/18):stable    -- CT brain w/o (07/09/18 @ 1328):stable   -- CT brain w/o (07/09/18 @ 0430):stable   -- CT brain w/o (07/08/18):postop changes from decompressive craniectomy with interval mild extracalvarial brain herniation through the craniectomy site; mild increase in cerebral edema at the  tumor resection bed and adjacent parenchyma without significant increase in hemorrhage   -- MRI brain w/wo (07/06/18):gross total resection of a left medial tentorial meningioma; hemorrhagic productswithin the resection cavity extending into the parenchyma of the left thalamus and tracking down within the left midbrain and extending to the fourth ventricle   -- CT brain w/o (07/06/18): postop changes from left decompressive craniectomy for evacuation of hematoma of tumor bed with residual hemorrhage and improved edema and rightward midline shift   -- CT brain w/o (07/06/18): postop changes from decompressive craniectomy for evacuation of tumor bed hematoma with minimal residual blood at the surgical site   -- CT brain w/o (07/05/18): postop changes with development of hematoma at tumor bed and  8 mm of midline shift  -- Pain/spasm control: Tylenol PRN, Motrin PRN  -- Diet: NPO + Osmolite TF  -- Bowel regimen held, last BM 08/02/18  -- Abx: none  -- Activity restrictions   -- Craniectomy precautions, helmet to be worn at all times when OOB  -- PT/OT: SNF vs LTAC  -- DVT ppx: Lovenox, SCDs/Venodynes  -- Consults:    NCCU/Neurology   -- Keppra1500 mg 2x/day   -- Vimpat 200 mg 2x/day   -- Dilantin 100 mg q8h; continue current dose despite being non-therapeutic (07/31/18)   -- Clobazam 20 mg q8h   -- FU in 4 weeks with Dr. Shelton Silvas   -- Signed off   Trauma - constipation   -- Trach/PEG completed 07/15/18   -- Tracheostomy plate sutures removed 07/23/18; stay sutures removed and place for 2-week follow-up (order placed)   -- Aggressive bowel regimen to alleviate stool burden; resume tube feeds with return of bowel function  -- Lines/Drains: Trach/PEG  -- Wound: L fronto-temporal incision (staples removed), JP drain Rapide, LD Rapide   -- Please bathe patient daily and clean wound daily    -- Okay for soap/shampoo   -- Apply bacitracin ointment BID  -- Disposition: ongoing, choice obtained for [place]   -- Will need follow-up CT-venogram to assess non-occlusive thrombus in transverse sinus    Satchel Heidinger A. Laural Golden, M.D.  PGY-4 Neurosurgery  08/03/2018, 01:39

## 2018-08-03 LAB — POC BLOOD GLUCOSE (RESULTS)
GLUCOSE, POC: 139 mg/dL — ABNORMAL HIGH (ref 70–105)
GLUCOSE, POC: 97 mg/dL (ref 70–105)
GLUCOSE, POC: 88 mg/dL (ref 70–105)
GLUCOSE, POC: 89 mg/dL (ref 70–105)

## 2018-08-03 MED ADMIN — magnesium citrate oral solution: GASTROSTOMY | @ 09:00:00

## 2018-08-03 MED ADMIN — phenytoin 100 mg/4 mL oral suspension: GASTROSTOMY | @ 23:00:00

## 2018-08-03 MED ADMIN — levETIRAcetam 500 mg tablet: GASTROSTOMY | @ 08:00:00

## 2018-08-03 MED ADMIN — furosemide 40 mg tablet: GASTROSTOMY | @ 22:00:00

## 2018-08-03 MED ADMIN — sodium chloride 0.9 % intravenous solution: GASTROSTOMY | @ 08:00:00 | NDC 00338004904

## 2018-08-03 MED ADMIN — sodium chloride 0.9 % (flush) injection syringe: GASTROSTOMY | @ 15:00:00

## 2018-08-03 NOTE — Care Plan (Signed)
Patient tolerating tube feeds through peg tube with no residual. Trach suctioned x 3, inner cannula changed, site cleansed and new dressing applied. Sitter select in place and functioning, call bell within reach.      Problem: Adult Inpatient Plan of Care  Goal: Plan of Care Review  Outcome: Ongoing (see interventions/notes)  Goal: Patient-Specific Goal (Individualization)  Outcome: Ongoing (see interventions/notes)  Goal: Absence of Hospital-Acquired Illness or Injury  Outcome: Ongoing (see interventions/notes)  Goal: Optimal Comfort and Wellbeing  Outcome: Ongoing (see interventions/notes)  Goal: Rounds/Family Conference  Outcome: Ongoing (see interventions/notes)     Problem: Fall Injury Risk  Goal: Absence of Fall and Fall-Related Injury  Outcome: Ongoing (see interventions/notes)     Problem: Skin Injury Risk Increased  Goal: Skin Health and Integrity  Outcome: Ongoing (see interventions/notes)     Problem: Hypertension Comorbidity  Goal: Blood Pressure in Desired Range  Outcome: Ongoing (see interventions/notes)     Problem: Depression  Goal: Improved Mood  Outcome: Ongoing (see interventions/notes)     Problem: Feeding Intolerance (Enteral Nutrition)  Goal: Feeding Tolerance  Outcome: Ongoing (see interventions/notes)     Problem: Bleeding (Craniotomy/Craniectomy/Cranioplasty)  Goal: Absence of Bleeding  Outcome: Ongoing (see interventions/notes)     Problem: Cerebral Tissue Perfusion Risk (Craniotomy/Craniectomy/Cranioplasty)  Goal: Effective Cerebral Tissue Perfusion  Outcome: Ongoing (see interventions/notes)     Problem: Fluid Imbalance (Craniotomy/Craniectomy/Cranioplasty)  Goal: Fluid Balance  Outcome: Ongoing (see interventions/notes)     Problem: Infection (Craniotomy/Craniectomy/Cranioplasty)  Goal: Absence of Infection Signs/Symptoms  Outcome: Ongoing (see interventions/notes)     Problem: Pain (Craniotomy/Craniectomy/Cranioplasty)  Goal: Acceptable Pain Control  Outcome: Ongoing (see  interventions/notes)     Problem: Bleeding (Surgery Nonspecified)  Goal: Absence of Bleeding  Outcome: Ongoing (see interventions/notes)     Problem: Bowel Elimination Impaired (Surgery Nonspecified)  Goal: Effective Bowel Elimination  Outcome: Ongoing (see interventions/notes)     Problem: Infection (Surgery Nonspecified)  Goal: Absence of Infection Signs/Symptoms  Outcome: Ongoing (see interventions/notes)     Problem: Pain (Surgery Nonspecified)  Goal: Acceptable Pain Control  Outcome: Ongoing (see interventions/notes)     Problem: Seizure, Active Management  Goal: Absence of Seizure/Seizure-Related Injury  Outcome: Ongoing (see interventions/notes)     [facility] Highest Level of Mobility Goal      Date: 08/03/18     JH-HLM Goal: 2    Exercise Level: 1- Lying in bed    Goal Outcome: Unable to achieve goal.  Related risk factors: Fatigue/Weakness

## 2018-08-03 NOTE — Progress Notes (Signed)
[facility]  NEUROSURGERY   PROGRESS NOTE    [name], [name], 58 y.o. female  Date of Admission:  07/05/2018  Date of Service: 08/04/2018  Date of Birth:  06/17/1961    Chief Complaint: right upper and lower extremity weakness    Subjective:  NAEO    Vital Signs:  Temp (24hrs) Max:37.2 C (99 F)      Systolic (13KGM), WNU:272 , Min:102 , ZDG:644     Diastolic (03KVQ), QVZ:56, Min:64, Max:83    Temp  Avg: 36.8 C (98.3 F)  Min: 36.6 C (97.9 F)  Max: 37.2 C (99 F)  MAP (Non-Invasive)  Avg: 85.4 mmHG  Min: 75 mmHG  Max: 98 mmHG  Pulse  Avg: 91.1  Min: 85  Max: 101  Resp  Avg: 16  Min: 16  Max: 16  SpO2  Avg: 98.8 %  Min: 98 %  Max: 100 %  Pain Score (Numeric, Faces): Other    Min/Max/Avg ICP/CPP last 24hrs:   No data recorded    Today's Physical Exam:    Appears acutely ill, intubated  GCS 1 5 1  T  -- Eyes do not open to noxious stimuli  -- Localizes in LUE, Withdraws in RUE and BLE  PERRL  Face symmetric  +cough    Unable to assess hearing  Unable to assess speech  Unable to assess fund of knowledge  Unable to assess attention span & concentration  Unable to assess recent and remote memory  CN 3 4 6  EOMI Unable to assess  CN 11 shrug symmetric Unable to assess  Muscle Strength Unable to assess  Unable to assess drift    -- L fronto-temporal incision c/d/i, staples removed  -- JP drain Rapide  -- LD Rapide    Assessment/Plan:  [name] is a 58 yo F PMH HTN, HLD w/ 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 POD30/30.  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   -- CTV intra w/wo (07/20/18): sub-occlusive left transverse sinus thrombus   -- US soft tissue axilla (07/20/18): nonspecific area of increased echogenicity seen at the site of concern in the right axilla measuring approximately 6.5 x 2.0 x 8.9 cm; appears nonmass-like with hazy borders and is likely related to cellulitis   -- MRI-brain w/wo (07/20/18): possible non-occlusive thrombus in transverse sinus   -- CT-brain wo (07/19/18): interval decrease of hematoma   -- Venous duplex BLE (07/16/18): left peroneal (calf vein) DVT   -- Venous duplex BUE (07/16/18): no evidence of DVT   -- TTE (07/12/18): EF 75%, no regional wall abnormalities   -- CT brain w/o (07/11/18):stable    -- CT brain w/o (07/09/18 @ 1328):stable   -- CT brain w/o (07/09/18 @ 0430):stable   -- CT brain w/o (07/08/18):postop changes from decompressive craniectomy with interval mild extracalvarial brain herniation through the craniectomy site; mild increase in cerebral edema at the tumor resection bed and adjacent parenchyma  without significant increase in hemorrhage   -- MRI brain w/wo (07/06/18):gross total resection of a left medial tentorial meningioma; hemorrhagic productswithin the resection cavity extending into the parenchyma of the left thalamus and tracking down within the left midbrain and extending to the fourth ventricle   -- CT brain w/o (07/06/18): postop changes from left decompressive craniectomy for evacuation of hematoma of tumor bed with residual hemorrhage and improved edema and rightward midline shift   -- CT brain w/o (07/06/18): postop changes from decompressive craniectomy for evacuation of tumor bed hematoma with minimal residual blood at the surgical site   -- CT brain w/o (07/05/18): postop changes with development of hematoma at tumor bed and 8 mm of midline shift  -- Pain/spasm  control: Tylenol PRN, Motrin PRN  -- Diet: NPO + Osmolite TF  -- Bowel regimen held, last BM 08/03/18  -- Abx: none  -- Activity restrictions   -- Craniectomy precautions, helmet to be worn at all times when OOB  -- PT/OT: SNF vs LTAC  -- DVT ppx: Lovenox, SCDs/Venodynes  -- Consults:    NCCU/Neurology   -- Keppra1500 mg 2x/day   -- Vimpat 200 mg 2x/day   -- Dilantin 100 mg q8h; continue current dose despite being non-therapeutic (07/31/18)   -- Clobazam 20 mg q8h   -- FU in 4 weeks with Dr. Shelton Silvas   -- Signed off   Trauma - constipation   -- Trach/PEG completed 07/15/18   -- Tracheostomy plate sutures removed 07/23/18; stay sutures removed and place for 2-week follow-up (order placed)   -- Aggressive bowel regimen to alleviate stool burden; resume tube feeds with return of bowel function  -- Lines/Drains: Trach/PEG  -- Wound: L fronto-temporal incision (staples removed), JP drain Rapide, LD Rapide   -- Please bathe patient daily and clean wound daily    -- Okay for soap/shampoo   -- Apply bacitracin ointment BID  -- Disposition: ongoing, choice obtained for [place]   -- Will need follow-up CT-venogram to assess non-occlusive thrombus in transverse sinus    Keane Police, M.D.  PGY-2 Neurosurgery  08/04/2018, 03:39

## 2018-08-03 NOTE — Care Plan (Signed)
Patient remains unresponsive. Trach care completed. Turned q2 hours. Bag balm and nystatin applied. Tube feedings tolerated well. Patient does not seem to be in any pain. Sitter select in place.

## 2018-08-04 ENCOUNTER — Inpatient Hospital Stay (HOSPITAL_COMMUNITY): Payer: PPO

## 2018-08-04 DIAGNOSIS — D329 Benign neoplasm of meninges, unspecified: Secondary | ICD-10-CM

## 2018-08-04 LAB — POC BLOOD GLUCOSE (RESULTS)
GLUCOSE, POC: 103 mg/dL (ref 70–105)
GLUCOSE, POC: 99 mg/dL (ref 70–105)
GLUCOSE, POC: 85 mg/dL (ref 70–105)
GLUCOSE, POC: 120 mg/dL — ABNORMAL HIGH (ref 70–105)
GLUCOSE, POC: 135 mg/dL — ABNORMAL HIGH (ref 70–105)

## 2018-08-04 MED ADMIN — levETIRAcetam 500 mg tablet: GASTROSTOMY | @ 08:00:00

## 2018-08-04 MED ADMIN — lisinopriL 10 mg tablet: GASTROSTOMY | @ 08:00:00

## 2018-08-04 MED ADMIN — ascorbic acid (vitamin C) 500 mg tablet: GASTROSTOMY | @ 08:00:00

## 2018-08-04 MED ADMIN — lactated Ringers intravenous solution: ORAL | @ 07:00:00 | NDC 00338011704

## 2018-08-04 MED ADMIN — lactated Ringers intravenous solution: TOPICAL | @ 10:00:00

## 2018-08-04 MED ADMIN — nicotine 21 mg/24 hr daily transdermal patch: TRANSDERMAL | @ 08:00:00

## 2018-08-04 MED ADMIN — artificial tears with lanolin eye ointment: GASTROSTOMY | @ 16:00:00 | NDC 00536108691

## 2018-08-04 MED ADMIN — scopolamine 1 mg over 3 days transdermal patch: ORAL | @ 16:00:00

## 2018-08-04 MED ADMIN — lactated Ringers intravenous solution: ORAL | @ 10:00:00

## 2018-08-04 MED ADMIN — lactated Ringers intravenous solution: TOPICAL | @ 20:00:00 | NDC 00264775000

## 2018-08-04 NOTE — Care Plan (Signed)
Obtunded, withdrew x 4 by end of shift. Unable to get L pupil to react, service notified. Also notified service for redness and increased swelling on L foot per family. VSS. Turned q2, mouth care and incontinence care provided. Bath completed. P&V q4. Tolerating feeds. BM this shift. SS in place for safety. Trach care provided at 1600, suctioned for thick/white sputum several times. Trach collar downgraded to RA. CT stable from AM. NPO at midnight, but waiting for decreased swelling before scheduling cranioplasty, no set date at this point, family updated.

## 2018-08-04 NOTE — Nurses Notes (Signed)
Service notified that L pupil not reactive and LUE will not withdraw. Azeem called back and stated that CT from this morning stable and to continue to monitor.

## 2018-08-04 NOTE — Care Plan (Signed)
Problem: Adult Inpatient Plan of Care  Goal: Plan of Care Review  Outcome: Ongoing (see interventions/notes)  Goal: Patient-Specific Goal (Individualization)  Outcome: Ongoing (see interventions/notes)  Goal: Absence of Hospital-Acquired Illness or Injury  Outcome: Ongoing (see interventions/notes)  Goal: Optimal Comfort and Wellbeing  Outcome: Ongoing (see interventions/notes)  Goal: Rounds/Family Conference  Outcome: Ongoing (see interventions/notes)     Problem: Fall Injury Risk  Goal: Absence of Fall and Fall-Related Injury  Outcome: Ongoing (see interventions/notes)     Problem: Skin Injury Risk Increased  Goal: Skin Health and Integrity  Outcome: Ongoing (see interventions/notes)     Problem: Hypertension Comorbidity  Goal: Blood Pressure in Desired Range  Outcome: Ongoing (see interventions/notes)     Problem: Depression  Goal: Improved Mood  Outcome: Ongoing (see interventions/notes)     Problem: Feeding Intolerance (Enteral Nutrition)  Goal: Feeding Tolerance  Outcome: Ongoing (see interventions/notes)     Problem: Bleeding (Craniotomy/Craniectomy/Cranioplasty)  Goal: Absence of Bleeding  Outcome: Ongoing (see interventions/notes)     Problem: Cerebral Tissue Perfusion Risk (Craniotomy/Craniectomy/Cranioplasty)  Goal: Effective Cerebral Tissue Perfusion  Outcome: Ongoing (see interventions/notes)     Problem: Fluid Imbalance (Craniotomy/Craniectomy/Cranioplasty)  Goal: Fluid Balance  Outcome: Ongoing (see interventions/notes)     Problem: Infection (Craniotomy/Craniectomy/Cranioplasty)  Goal: Absence of Infection Signs/Symptoms  Outcome: Ongoing (see interventions/notes)     Problem: Pain (Craniotomy/Craniectomy/Cranioplasty)  Goal: Acceptable Pain Control  Outcome: Ongoing (see interventions/notes)     Problem: Bleeding (Surgery Nonspecified)  Goal: Absence of Bleeding  Outcome: Ongoing (see interventions/notes)     Problem: Bowel Elimination Impaired (Surgery Nonspecified)  Goal: Effective Bowel  Elimination  Outcome: Ongoing (see interventions/notes)     Problem: Infection (Surgery Nonspecified)  Goal: Absence of Infection Signs/Symptoms  Outcome: Ongoing (see interventions/notes)     Problem: Pain (Surgery Nonspecified)  Goal: Acceptable Pain Control  Outcome: Ongoing (see interventions/notes)     Problem: Seizure, Active Management  Goal: Absence of Seizure/Seizure-Related Injury  Outcome: Ongoing (see interventions/notes)   Plan of care reviewed with patient, who is unable to participate. Patient is nonverbal, nonresponsive, does not follow instructions, withdraws from pain times 4 with infrequent spontaneous movement in LUE. Fall precautions maintained, including sitter select, although patient has shown no ability to attempt bed exit. Medications and enteral feedings by PEG, trach patent and secured. PV bed and occasional suction required, productive cough with stimulation. ATC on hospital air for part of night, but returned to 40% FiO2 after SpO2 declined while sleeping more soundly.

## 2018-08-04 NOTE — Nurses Notes (Signed)
Paged service because pt family at bedside concerned about pt being NPO and scheduling concerns. Spoke with service state NPO is for tonight and Dr will be around in the morning to provide an update/plan to answer these questions. Everything is as a precaution. Likeliness of surgery 1/13 is slim and nothing is on the schedule as of now for surgery.     Pt family understands better now and will be here in the morning to speak with dr

## 2018-08-04 NOTE — Nurses Notes (Signed)
Service did come to bedside to speak with family.    Pt was then changed to floor status and Q4

## 2018-08-05 LAB — BASIC METABOLIC PANEL
ANION GAP: 11 mmol/L (ref 4–13)
BUN/CREA RATIO: 27 — ABNORMAL HIGH (ref 6–22)
BUN: 16 mg/dL (ref 8–25)
CALCIUM: 9 mg/dL (ref 8.5–10.2)
CHLORIDE: 103 mmol/L (ref 96–111)
CO2 TOTAL: 24 mmol/L (ref 22–32)
CREATININE: 0.59 mg/dL (ref 0.49–1.10)
ESTIMATED GFR: >60 mL/min/1.73mˆ2 (ref >60)
GLUCOSE: 166 mg/dL — ABNORMAL HIGH (ref 65–139)
POTASSIUM: 3.7 mmol/L (ref 3.5–5.1)
SODIUM: 138 mmol/L (ref 136–145)

## 2018-08-05 LAB — CBC WITH DIFF
BASOPHIL #: <0.1 10*3/uL (ref <=0.20)
BASOPHIL %: 1 %
EOSINOPHIL #: 0.2 10*3/uL (ref <=0.50)
EOSINOPHIL %: 3 %
HCT: 30.7 % — ABNORMAL LOW (ref 34.8–46.0)
HGB: 9.6 g/dL — ABNORMAL LOW (ref 11.5–16.0)
IMMATURE GRANULOCYTE #: <0.1 10*3/uL (ref <0.10)
IMMATURE GRANULOCYTE %: 0 % (ref 0–1)
LYMPHOCYTE #: 1.43 10*3/uL (ref 1.00–4.80)
LYMPHOCYTE %: 21 %
MCH: 28.4 pg (ref 26.0–32.0)
MCHC: 31.3 g/dL (ref 31.0–35.5)
MCV: 90.8 fL (ref 78.0–100.0)
MONOCYTE #: 0.78 x10ˆ3/uL (ref 0.20–1.10)
MONOCYTE %: 11 %
MPV: 9.4 fL (ref 8.7–12.5)
NEUTROPHIL #: 4.42 x10ˆ3/uL (ref 1.50–7.70)
NEUTROPHIL %: 64 %
PLATELETS: 367 x10ˆ3/uL (ref 150–400)
RBC: 3.38 x10ˆ6/uL — ABNORMAL LOW (ref 3.85–5.22)
RDW-CV: 16.9 % — ABNORMAL HIGH (ref 11.5–15.5)
WBC: 6.9 10*3/uL (ref 3.7–11.0)

## 2018-08-05 LAB — POC BLOOD GLUCOSE (RESULTS)
GLUCOSE, POC: 99 mg/dL (ref 70–105)
GLUCOSE, POC: 98 mg/dL (ref 70–105)
GLUCOSE, POC: 92 mg/dL (ref 70–105)

## 2018-08-05 LAB — MAGNESIUM: MAGNESIUM: 1.9 mg/dL (ref 1.6–2.6)

## 2018-08-05 LAB — PHOSPHORUS: PHOSPHORUS: 4.2 mg/dL (ref 2.4–4.7)

## 2018-08-05 MED ADMIN — enoxaparin 40 mg/0.4 mL subcutaneous syringe: SUBCUTANEOUS | @ 16:00:00

## 2018-08-05 MED ADMIN — levETIRAcetam 500 mg tablet: GASTROSTOMY | @ 21:00:00

## 2018-08-05 MED ADMIN — cloBAZam 2.5 mg/mL oral suspension: GASTROSTOMY | @ 14:00:00

## 2018-08-05 MED ADMIN — silver sulfADIAZINE 1 % topical cream: TOPICAL | @ 21:00:00

## 2018-08-05 MED ADMIN — heparin (porcine) 5,000 unit/mL injection solution: TOPICAL | @ 09:00:00

## 2018-08-05 MED ADMIN — sodium chloride 0.9 % intravenous solution: GASTROSTOMY | @ 21:00:00 | NDC 00338004904

## 2018-08-05 MED ADMIN — sodium chloride 0.9 % (flush) injection syringe: TOPICAL | @ 21:00:00

## 2018-08-05 MED ADMIN — ADULT CLINMIX CUSTOM PARENTERAL NUTRITION - ~~LOC~~: TRANSDERMAL | @ 09:00:00

## 2018-08-05 NOTE — Care Plan (Signed)
Patient withdraws to repeated painful stimulation, but otherwise non responsive.

## 2018-08-05 NOTE — Care Plan (Signed)
Pt obtunded all shift. Has no signs of pain or distress. HR SNR- low 100s otherwise VS within parameters. All extremities w/d. Left pupil sluggish, right pupil brisk- service aware of changes compared to day assessment. Pt daughters came to bedside.. Service came to bedside to answer questions as well. Tubefeed held at [place] care approx 0300. FS wnl. Frequent turns.    No acute events   Call bell within reach   Sitter select in place   Seizure precautions in place.   Helmet when oob- at bedside       [facility] Level of Mobility Goal      Date: 08/05/18     JH-HLM Goal: 1    Exercise Level: 1- Lying in bed    Goal Outcome: Goal Achieved                   Problem: Adult Inpatient Plan of Care  Goal: Plan of Care Review  Outcome: Ongoing (see interventions/notes)  Goal: Patient-Specific Goal (Individualization)  Outcome: Ongoing (see interventions/notes)  Flowsheets (Taken 08/05/2018 0138)  Individualized Care Needs: DVT left leg. no SCD on left leg.  Anxieties, Fears or Concerns: pt worried about pt getting foot drop.  Patient-Specific Goals (Include Timeframe): wanting to know schedule for surgery etc.  Goal: Absence of Hospital-Acquired Illness or Injury  Outcome: Ongoing (see interventions/notes)  Goal: Optimal Comfort and Wellbeing  Outcome: Ongoing (see interventions/notes)  Goal: Rounds/Family Conference  Outcome: Ongoing (see interventions/notes)     Problem: Fall Injury Risk  Goal: Absence of Fall and Fall-Related Injury  Outcome: Ongoing (see interventions/notes)     Problem: Skin Injury Risk Increased  Goal: Skin Health and Integrity  Outcome: Ongoing (see interventions/notes)     Problem: Hypertension Comorbidity  Goal: Blood Pressure in Desired Range  Outcome: Ongoing (see interventions/notes)     Problem: Depression  Goal: Improved Mood  Outcome: Ongoing (see interventions/notes)     Problem: Feeding Intolerance (Enteral Nutrition)  Goal: Feeding Tolerance  Outcome: Ongoing (see  interventions/notes)     Problem: Bleeding (Craniotomy/Craniectomy/Cranioplasty)  Goal: Absence of Bleeding  Outcome: Ongoing (see interventions/notes)     Problem: Cerebral Tissue Perfusion Risk (Craniotomy/Craniectomy/Cranioplasty)  Goal: Effective Cerebral Tissue Perfusion  Outcome: Ongoing (see interventions/notes)     Problem: Fluid Imbalance (Craniotomy/Craniectomy/Cranioplasty)  Goal: Fluid Balance  Outcome: Ongoing (see interventions/notes)     Problem: Infection (Craniotomy/Craniectomy/Cranioplasty)  Goal: Absence of Infection Signs/Symptoms  Outcome: Ongoing (see interventions/notes)     Problem: Pain (Craniotomy/Craniectomy/Cranioplasty)  Goal: Acceptable Pain Control  Outcome: Ongoing (see interventions/notes)     Problem: Bleeding (Surgery Nonspecified)  Goal: Absence of Bleeding  Outcome: Ongoing (see interventions/notes)     Problem: Bowel Elimination Impaired (Surgery Nonspecified)  Goal: Effective Bowel Elimination  Outcome: Ongoing (see interventions/notes)     Problem: Infection (Surgery Nonspecified)  Goal: Absence of Infection Signs/Symptoms  Outcome: Ongoing (see interventions/notes)     Problem: Pain (Surgery Nonspecified)  Goal: Acceptable Pain Control  Outcome: Ongoing (see interventions/notes)     Problem: Communication Impairment (Artificial Airway)  Goal: Effective Communication  08/05/2018 0138 by Earley Abide, RN  Outcome: Ongoing (see interventions/notes)  08/05/2018 0138 by Earley Abide, RN  Reactivated     Problem: Device-Related Complication Risk (Artificial Airway)  Goal: Optimal Device Function  08/05/2018 0138 by Earley Abide, RN  Outcome: Ongoing (see interventions/notes)  08/05/2018 0138 by Earley Abide, RN  Reactivated     Problem: Skin and Tissue Injury (Artificial Airway)  Goal: Absence of Device-Related  Skin or Tissue Injury  08/05/2018 0138 by Earley Abide, RN  Outcome: Ongoing (see interventions/notes)  08/05/2018 0138 by Earley Abide, RN  Reactivated     Problem:  Seizure, Active Management  Goal: Absence of Seizure/Seizure-Related Injury  Outcome: Ongoing (see interventions/notes)

## 2018-08-05 NOTE — Progress Notes (Signed)
[facility]  NEUROSURGERY   PROGRESS NOTE    [name], [name], 58 y.o. female  Date of Admission:  07/05/2018  Date of Service: 08/06/2018  Date of Birth:  1961/02/28    Chief Complaint: right upper and lower extremity weakness    Subjective:  NAEO. Care management looking into LTACHs in [place] and [place].    Vital Signs:  Temp (24hrs) Max:37.5 C (19.4 F)      Systolic (17EYC), XKG:818 , Min:101 , HUD:149     Diastolic (70YOV), ZCH:88, Min:55, Max:83    Temp  Avg: 37 C (98.6 F)  Min: 36.7 C (98.1 F)  Max: 37.5 C (99.5 F)  MAP (Non-Invasive)  Avg: 84 mmHG  Min: 70 mmHG  Max: 95 mmHG  Pulse  Avg: 96.4  Min: 90  Max: 103  Resp  Avg: 17.2  Min: 16  Max: 18  SpO2  Avg: 94.1 %  Min: 91 %  Max: 98 %  Pain Score (Numeric, Faces): Other    Min/Max/Avg ICP/CPP last 24hrs:   No data recorded    Today's Physical Exam:    Appears acutely ill, intubated  GCS 1 5 1  T  -- Eyes do not open to noxious stimuli  -- Localizes in LUE, Withdraws in RUE and BLE  Pupils 4 mm L sluggish R brisk  Face symmetric  +cough    Unable to assess hearing  Unable to assess speech  Unable to assess fund of knowledge  Unable to assess attention span & concentration  Unable to assess recent and remote memory  CN 3 4 6  EOMI Unable to assess  CN 11 shrug symmetric Unable to assess  Muscle Strength Unable to assess  Unable to assess drift    -- L fronto-temporal incision c/d/i, staples removed  -- JP drain Rapide  -- LD Rapide    Assessment/Plan:  [name] is a 58 yo F PMH HTN, HLD w/ 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 FOY77/41.  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   -- CTV intra w/wo (07/20/18): sub-occlusive left transverse sinus thrombus   -- US soft tissue axilla (07/20/18): nonspecific area of increased echogenicity seen at the site of concern in the right axilla measuring approximately 6.5 x 2.0 x 8.9 cm; appears nonmass-like with hazy borders and is likely related to cellulitis   -- MRI-brain w/wo (07/20/18): possible non-occlusive thrombus in transverse sinus   -- CT-brain wo (07/19/18): interval decrease of hematoma   -- Venous duplex BLE (07/16/18): left peroneal (calf vein) DVT   -- Venous duplex BUE (07/16/18): no evidence of DVT   -- TTE (07/12/18): EF 75%, no regional wall abnormalities   -- CT brain w/o (07/11/18):stable    -- CT brain w/o (07/09/18 @ 1328):stable   -- CT brain w/o (07/09/18 @ 0430):stable   -- CT brain w/o (07/08/18):postop changes from decompressive craniectomy with interval mild extracalvarial brain herniation through the  craniectomy site; mild increase in cerebral edema at the tumor resection bed and adjacent parenchyma without significant increase in hemorrhage   -- MRI brain w/wo (07/06/18):gross total resection of a left medial tentorial meningioma; hemorrhagic productswithin the resection cavity extending into the parenchyma of the left thalamus and tracking down within the left midbrain and extending to the fourth ventricle   -- CT brain w/o (07/06/18): postop changes from left decompressive craniectomy for evacuation of hematoma of tumor bed with residual hemorrhage and improved edema and rightward midline shift   -- CT brain w/o (07/06/18): postop changes from decompressive craniectomy for evacuation of tumor bed hematoma with minimal residual blood at the surgical site   -- CT brain w/o (07/05/18): postop changes with development  of hematoma at tumor bed and 8 mm of midline shift  -- Pain/spasm control: Tylenol PRN, Motrin PRN  -- Diet: NPO + Osmolite TF  -- Bowel regimen held, last BM 08/05/18  -- Abx: none  -- Activity restrictions   -- Craniectomy precautions, helmet to be worn at all times when OOB  -- PT/OT: SNF vs LTAC  -- DVT ppx: Lovenox, SCDs/Venodynes  -- Consults:    NCCU/Neurology   -- Keppra1500 mg 2x/day   -- Vimpat 200 mg 2x/day   -- Dilantin 100 mg q8h; continue current dose despite being non-therapeutic (07/31/18)   -- Clobazam 20 mg q8h   -- FU in 4 weeks with Dr. Shelton Silvas   -- Signed off   Trauma - constipation   -- Trach/PEG completed 07/15/18   -- Tracheostomy plate sutures removed 07/23/18; stay sutures removed and place for 2-week follow-up (order placed)   -- Aggressive bowel regimen to alleviate stool burden; resume tube feeds with return of bowel function  -- Lines/Drains: Trach/PEG  -- Wound: L fronto-temporal incision (staples removed), JP drain Rapide, LD Rapide   -- Please bathe patient daily and clean wound daily    -- Okay for soap/shampoo   -- Apply bacitracin ointment BID  -- Disposition: ongoing, choice obtained for [place]   -- Will need follow-up CT-venogram to assess non-occlusive thrombus in transverse sinus    Keane Police, M.D.  PGY-2 Neurosurgery  08/06/2018, 00:11       I saw and examined the patient.  I reviewed the resident's note.  I agree with the findings and plan of care as documented in the resident's note.  Any exceptions/additions are edited/noted.    [name], MD

## 2018-08-05 NOTE — Progress Notes (Signed)
[facility]  NEUROSURGERY   PROGRESS NOTE    [name], [name], 58 y.o. female  Date of Admission:  07/05/2018  Date of Service: 08/05/2018  Date of Birth:  [birth year]    Chief Complaint: right upper and lower extremity weakness    Subjective:  Family updated at bedside regarding plan of care.    Vital Signs:  Temp (24hrs) Max:37.8 C (449 F)      Systolic (67RFF), MBW:466 , Min:105 , ZLD:357     Diastolic (01XBL), TJQ:30, Min:58, Max:79    Temp  Avg: 37.2 C (99 F)  Min: 36.9 C (98.5 F)  Max: 37.8 C (100 F)  MAP (Non-Invasive)  Avg: 85 mmHG  Min: 72 mmHG  Max: 94 mmHG  Pulse  Avg: 97.1  Min: 91  Max: 105  Resp  Avg: 16.3  Min: 16  Max: 18  SpO2  Avg: 93.4 %  Min: 89 %  Max: 97 %  Pain Score (Numeric, Faces): Other    Min/Max/Avg ICP/CPP last 24hrs:   No data recorded    Today's Physical Exam:    Appears acutely ill, intubated  GCS 1 5 1  T  -- Eyes do not open to noxious stimuli  -- Localizes in LUE, Withdraws in RUE and BLE  Pupils 4 mm L sluggish R brisk  Face symmetric  +cough    Unable to assess hearing  Unable to assess speech  Unable to assess fund of knowledge  Unable to assess attention span & concentration  Unable to assess recent and remote memory  CN 3 4 6  EOMI Unable to assess  CN 11 shrug symmetric Unable to assess  Muscle Strength Unable to assess  Unable to assess drift    -- L fronto-temporal incision c/d/i, staples removed  -- JP drain Rapide  -- LD Rapide    Assessment/Plan:  [name] is a 58 yo F PMH HTN, HLD w/ 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 POD31/31.  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   -- CTV intra w/wo (07/20/18): sub-occlusive left transverse sinus thrombus   -- US soft tissue axilla (07/20/18): nonspecific area of increased echogenicity seen at the site of concern in the right axilla measuring approximately 6.5 x 2.0 x 8.9 cm; appears nonmass-like with hazy borders and is likely related to cellulitis   -- MRI-brain w/wo (07/20/18): possible non-occlusive thrombus in transverse sinus   -- CT-brain wo (07/19/18): interval decrease of hematoma   -- Venous duplex BLE (07/16/18): left peroneal (calf vein) DVT   -- Venous duplex BUE (07/16/18): no evidence of DVT   -- TTE (07/12/18): EF 75%, no regional wall abnormalities   -- CT brain w/o (07/11/18):stable    -- CT brain w/o (07/09/18 @ 1328):stable   -- CT brain w/o (07/09/18 @ 0430):stable   -- CT brain w/o (07/08/18):postop changes from decompressive craniectomy with interval mild extracalvarial brain herniation through the craniectomy site;  mild increase in cerebral edema at the tumor resection bed and adjacent parenchyma without significant increase in hemorrhage   -- MRI brain w/wo (07/06/18):gross total resection of a left medial tentorial meningioma; hemorrhagic productswithin the resection cavity extending into the parenchyma of the left thalamus and tracking down within the left midbrain and extending to the fourth ventricle   -- CT brain w/o (07/06/18): postop changes from left decompressive craniectomy for evacuation of hematoma of tumor bed with residual hemorrhage and improved edema and rightward midline shift   -- CT brain w/o (07/06/18): postop changes from decompressive craniectomy for evacuation of tumor bed hematoma with minimal residual blood at the surgical site   -- CT brain w/o (07/05/18): postop changes with development of  hematoma at tumor bed and 8 mm of midline shift  -- Pain/spasm control: Tylenol PRN, Motrin PRN  -- Diet: NPO + Osmolite TF (held)  -- Bowel regimen held, last BM 08/04/18  -- Abx: none  -- Activity restrictions   -- Craniectomy precautions, helmet to be worn at all times when OOB  -- PT/OT: SNF vs LTAC  -- DVT ppx: Lovenox, SCDs/Venodynes  -- Consults:    NCCU/Neurology   -- Keppra1500 mg 2x/day   -- Vimpat 200 mg 2x/day   -- Dilantin 100 mg q8h; continue current dose despite being non-therapeutic (07/31/18)   -- Clobazam 20 mg q8h   -- FU in 4 weeks with Dr. Shelton Silvas   -- Signed off   Trauma - constipation   -- Trach/PEG completed 07/15/18   -- Tracheostomy plate sutures removed 07/23/18; stay sutures removed and place for 2-week follow-up (order placed)   -- Aggressive bowel regimen to alleviate stool burden; resume tube feeds with return of bowel function  -- Lines/Drains: Trach/PEG  -- Wound: L fronto-temporal incision (staples removed), JP drain Rapide, LD Rapide   -- Please bathe patient daily and clean wound daily    -- Okay for soap/shampoo   -- Apply bacitracin ointment BID  -- Disposition: ongoing, choice obtained for [place]   -- Will need follow-up CT-venogram to assess non-occlusive thrombus in transverse sinus    Azeem A. Laural Golden, M.D.  PGY-4 Neurosurgery  08/05/2018, 00:20       I saw and examined the patient.  I reviewed the resident's note.  I agree with the findings and plan of care as documented in the resident's note.  Any exceptions/additions are edited/noted.    [name], MD

## 2018-08-05 NOTE — Care Management Notes (Signed)
[facility]  Care Management Note    Patient Name: Mykela Mewborn  Date of Birth: 1961-06-16  Sex: female  Date/Time of Admission: 07/05/2018  7:26 AM  Room/Bed: 1078/B  Payor: HEALTH PLAN / Plan: HEALTH PLAN (PPO-NON ST EMP) / Product Type: PPO /    LOS: 31 days   Primary Care Providers:  Isac Caddy, MD, MD (General)    Admitting Diagnosis:  Meningioma (CMS [facility]) [D32.9]    Assessment:      08/05/18 1202   Assessment Details   Assessment Type Continued Assessment   Date of Care Management Update 08/05/18   Date of Next DCP Update 08/08/18   Care Management Plan   Discharge Planning Status plan in progress   Projected Discharge Date 08/06/18   Discharge Needs Assessment   Discharge Facility/Level of Care Needs LTAC (code 63)   Transportation Available ambulance       Discharge Plan:  LTAC (code 23)  Per service, patient is d/c ready for LTAC. I spoke to [place] from [employer] and she said both [facility] [place] and [place] sites are in network. I spoke to daughter, [name] and she would like me to look for a facility in several areas in [place]. I called Monica at [facility] [employer] and there are no LTAC facilities in [place] with a contract. I explained to [name] ans they will take a ride to visit the [place] facility but will likely choose there. Message left for Melanie at [facility].     The patient will continue to be evaluated for developing discharge needs.     Case Manager: Dyanne Iha, [place] COORDINATOR  Phone: [phone]

## 2018-08-05 NOTE — Nurses Notes (Signed)
Notified Dr. Verdie Mosher that patient's PVR 200 ml. Dr. Verdie Mosher said patient could be straight cath only if above 500 ml.

## 2018-08-06 LAB — POC BLOOD GLUCOSE (RESULTS)
GLUCOSE, POC: 154 mg/dL — ABNORMAL HIGH (ref 70–105)
GLUCOSE, POC: 134 mg/dL — ABNORMAL HIGH (ref 70–105)
GLUCOSE, POC: 128 mg/dL — ABNORMAL HIGH (ref 70–105)
GLUCOSE, POC: 98 mg/dL (ref 70–105)

## 2018-08-06 MED ADMIN — chlorhexidine gluconate 0.12 % mouthwash: ORAL | @ 22:00:00

## 2018-08-06 MED ADMIN — bacitracin 500 unit/gram topical ointment: TOPICAL | @ 22:00:00

## 2018-08-06 MED ADMIN — lacosamide 100 mg tablet: GASTROSTOMY | @ 09:00:00

## 2018-08-06 MED ADMIN — phenytoin 100 mg/4 mL oral suspension: GASTROSTOMY | @ 06:00:00

## 2018-08-06 MED ADMIN — sodium chloride 0.9 % (flush) injection syringe: TOPICAL | @ 09:00:00

## 2018-08-06 MED ADMIN — HYDROmorphone (PF) 30 mg/30 mL (1 mg/mL) in 0.9 % NaCl IV PCA syringe: GASTROSTOMY | @ 09:00:00

## 2018-08-06 MED ADMIN — sodium chloride 0.9 % (flush) injection syringe: GASTROSTOMY | @ 22:00:00

## 2018-08-06 NOTE — Nurses Notes (Signed)
Repos q 2 hours during shift. Suctioned 2-3 times for mod thick secretions .Tube feedings continued without difficulty. Frequent oral care and pericare carried out .Marland Kitchen

## 2018-08-06 NOTE — Nurses Notes (Signed)
Patient suctions for a moderate amount of yellow secretions. Patient's current O2Sat 97% on 35%ATC. Patient incontinent; changed and cleaned at this time with assist of CAs. Feeding administered via G-Tube as ordered with no issues. 0 Residual. Patient turned and repositioned. Will continue to monitor.

## 2018-08-06 NOTE — Care Plan (Signed)
Patient resting in bed, respirations even and unlabored, vss. Trach care provided, supplies and suction at bedside. No change in neuro status, see flow chart. Skin, fall,  seizure, aspiration precautions observed, turned q 2, side rails padded, HOB elevated, sitter select in use. Frequent checks performed to ensure safety and comfort.   Problem: Adult Inpatient Plan of Care  Goal: Plan of Care Review  Outcome: Ongoing (see interventions/notes)     Problem: Adult Inpatient Plan of Care  Goal: Patient-Specific Goal (Individualization)  Outcome: Ongoing (see interventions/notes)  Flowsheets (Taken 08/06/2018 0827)  Individualized Care Needs: fall, skin, seizure precautions turn q 2, sitter select in use     Problem: Fall Injury Risk  Goal: Absence of Fall and Fall-Related Injury  Outcome: Ongoing (see interventions/notes)     Problem: Skin Injury Risk Increased  Goal: Skin Health and Integrity  Outcome: Ongoing (see interventions/notes)     Problem: Pain (Craniotomy/Craniectomy/Cranioplasty)  Goal: Acceptable Pain Control  Outcome: Ongoing (see interventions/notes)     Problem: Seizure, Active Management  Goal: Absence of Seizure/Seizure-Related Injury  Outcome: Ongoing (see interventions/notes)     Problem: Skin and Tissue Injury (Artificial Airway)  Goal: Absence of Device-Related Skin or Tissue Injury  Outcome: Ongoing (see interventions/notes)

## 2018-08-06 NOTE — Care Management Notes (Signed)
[facility]  Care Management Note    Patient Name: Sukhman Kocher  Date of Birth: May 01, 1961  Sex: female  Date/Time of Admission: 07/05/2018  7:26 AM  Room/Bed: 929/A  Payor: HEALTH PLAN / Plan: HEALTH PLAN (PPO-NON ST EMP) / Product Type: PPO /    LOS: 32 days   Primary Care Providers:  Isac Caddy, MD, MD (General)    Admitting Diagnosis:  Meningioma (CMS [facility]) [D32.9]    Assessment:      08/06/18 0820   Assessment Details   Assessment Type Other   Date of Care Management Update 08/06/18   Patient Hand-Off   Clinical/Discharge Plan of Care Information Communicated to:  Clinical Care Coordinator   Comments verbal hand-off given to R. Linwood Dibbles after transfer to [place].       Discharge Plan:  LTAC (code 42)  Patient transferred to [place] - verbal hand-off given to R. Nikzad. Message from daughter on VM states that she has decided on [facility] in [place], [place], liaison has started review and they will proceed with submitting auth once review complete.    The patient will continue to be evaluated for developing discharge needs.     Case Manager: Dyanne Iha, [place] COORDINATOR  Phone: [phone]

## 2018-08-06 NOTE — Care Management Notes (Signed)
[facility]  Care Management Note    Patient Name: [name]  Date of Birth: [birth year]  Sex: female  Date/Time of Admission: 07/05/2018  7:26 AM  Room/Bed: 929/A  Payor: HEALTH PLAN / Plan: HEALTH PLAN (PPO-NON ST EMP) / Product Type: PPO /    LOS: 32 days   Primary Care Providers:  Isac Caddy, MD, MD (General)    Admitting Diagnosis:  Meningioma (CMS [facility]) [D32.9]    Assessment:      08/06/18 1464   Assessment Details   Assessment Type Continued Assessment   Date of Care Management Update 08/06/18   Date of Next DCP Update 08/09/18   Care Management Plan   Discharge Planning Status plan in progress   Projected Discharge Date 08/06/18   Discharge plan discussed with: MPOA   Discharge Needs Assessment   Discharge Facility/Level of Care Needs LTAC (code 51)       Discharge Plan:  LTAC (code 22)  Per TBR with service, patient is medically stable for discharge to LTAC today. [place] spoke to [place] [phone]) and confirmed bed offer. Liaison to submit for auth today, auth could take up to 24 hours per liaison. [place] updated MPOA at bedside to review discharge plan and discuss auth process. MPOA verbalized understanding.  MPOA asking about paperwork completion from last week and who to talk with about helmet. Service updated of MPOA requests. [facility] following LTAC referral, discharge to [place] pending auth.     The patient will continue to be evaluated for developing discharge needs.     Case Manager: Genella Rife, [place] COORDINATOR  Phone: [phone]

## 2018-08-06 NOTE — Nurses Notes (Signed)
Patient arrived from [place] to 929 b

## 2018-08-06 NOTE — Nurses Notes (Signed)
paged service pt has mpoa present and she has concerns, that she

## 2018-08-06 NOTE — Discharge Summary (Signed)
[facility]  DISCHARGE SUMMARY    PATIENT NAME:  [name], [name]  MRN:  [id number]  DOB:  July 19, 1961    ENCOUNTER DATE:  07/05/2018  INPATIENT ADMISSION DATE: 07/05/2018  DISCHARGE DATE:  08/07/2018    ATTENDING PHYSICIAN: [name], MD  SERVICE: NEUROSURGERY 2  PRIMARY CARE PHYSICIAN: Isac Caddy, MD       LAY CAREGIVER: [place] Caregiver Name: Warner Mccreedy Caregiver Contact Number: [phone], Lay Caregiver Relationship to patient: child      PRIMARY DISCHARGE DIAGNOSIS: Meningioma (CMS [facility])  Active Hospital Problems    Diagnosis Date Noted   . 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 Tobacco use disorder [F17.200] 07/11/2018   . Hypokalemia [E87.6] 07/10/2018      Resolved Hospital Problems   No resolved problems to display.     Active Non-Hospital Problems    Diagnosis Date Noted   . Encounter for HCV screening test for high risk patient 06/08/2018        DISCHARGE MEDICATIONS:     Current Discharge Medication List      START taking these medications.      Details   ascorbic acid (vitamin C) 500 mg Tablet  Commonly known as:  VITAMIN C   500 mg, Gastric (NG, OG, PEG, GT), 2 TIMES DAILY  Refills:  0     aspirin 325 mg Tablet  Start taking on:  August 08, 2018   325 mg, Gastric (NG, OG, PEG, GT), DAILY  Refills:  0     bacitracin 500 unit/gram Ointment   Topical, 2 TIMES DAILY  Refills:  0     cloBAZam 2.5 mg/mL Suspension  Commonly known as:  ONFI   20 mg, Gastric (NG, OG, PEG, GT), EVERY 8 HOURS (SCHEDULED)  Refills:  0     enoxaparin 40 mg/0.4 mL Syringe  Commonly known as:  LOVENOX   40 mg, Subcutaneous, EVERY 24 HOURS  Refills:  0     insulin lispro 100 units/mL Injectable  Commonly known as:  HUMALOG   0-12 Units, Subcutaneous, EVERY 4 HOURS PRN  Refills:  0     lacosamide 200 mg Tablet  Commonly known as:  VIMPAT   200 mg, Gastric (NG, OG, PEG,  GT), 2 TIMES DAILY  Refills:  0     levETIRAcetam 750 mg Tablet  Commonly known as:  KEPPRA   1,500 mg, Gastric (NG, OG, PEG, GT), 2 TIMES DAILY  Refills:  0     nicotine 7 mg/24 hr Patch 24 hr  Commonly known as:  NICODERM CQ  Start taking on:  August 08, 2018   7 mg, Transdermal, DAILY  Refills:  0     nutrition protein supplement Liquid  Start taking on:  August 08, 2018   15 g, Gastric (NG, OG, PEG, GT), DAILY  Refills:  0     * nystatin 100,000 unit/mL Suspension  Commonly known as:  MYCOSTATIN   5 mL, Swish & Spit, 4 TIMES DAILY  Refills:  0     * nystatin 100,000 unit/gram Powder  Commonly known as:  NYSTOP   1 Units, Apply Topically, 2 TIMES DAILY  Refills:  0     phenytoin 100 mg/4 mL Suspension  Commonly known as:  DILANTIN   100 mg, Gastric (NG, OG, PEG, GT), EVERY 8 HOURS (SCHEDULED)  Refills:  0         *  This list has 2 medication(s) that are the same as other medications prescribed for you. Read the directions carefully, and ask your doctor or other care provider to review them with you.            CONTINUE these medications - NO CHANGES were made during your visit.      Details   acetaminophen 325 mg Tablet  Commonly known as:  TYLENOL   650 mg, Oral, EVERY 6 HOURS PRN  Refills:  0     diphenoxylate-atropine 2.5-0.025 mg Tablet  Commonly known as:  LOMOTIL   DAILY PRN  Refills:  2     metoprolol tartrate 25 mg Tablet  Commonly known as:  LOPRESSOR   25 mg, Oral, 2 TIMES DAILY  Refills:  0     ondansetron 8 mg Tablet  Commonly known as:  ZOFRAN   8 mg, Oral, 2 TIMES DAILY PRN  Refills:  0     quinapril 40 mg Tablet  Commonly known as:  ACCUPRIL   40 mg, Oral, DAILY  Refills:  2          Discharge med list refreshed?  YES       ALLERGIES:  No Known Allergies      HOSPITAL PROCEDURE(S):   Bedside Procedures:  Orders Placed This Encounter   Procedures   . BEDSIDE  MISC PROCEDURE   . BEDSIDE  MISC PROCEDURE   . BEDSIDE  MISC PROCEDURE   . BEDSIDE  CENTRAL LINE INSERTION/CHANGE/REMOVAL   . BEDSIDE  MISC  PROCEDURE     Surgical Procedure(s):  TRACHEOSTOMY  GASTROSCOPY WITH PLACEMENT PEG TUBE    REASON FOR HOSPITALIZATION AND HOSPITAL COURSE     BRIEF HPI:  This is a 58 y.o., female admitted for a left temporal craniotomy for resection of left tentorial meningioma. She is well known to the Neurosurgery service and was seen in clinic on 06/11/18 with the following documentation:    [name] is a 58 year old female presenting to clinic for follow up hospital admission for worsening right sided weakness.  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 Plan was for outpatient follow up.     The patient reports 2-3 year history of right upper and lower extremity weakness. She was seen by Dr. Jimmey Ralph on 05/21/18. Exam findings included right Hoffman and Babinski with 3+ reflexes. MRI brain and C spine were ordered. The patient later presented to [facility] ED with c/o worsening right sided weakness. 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     BRIEF HOSPITAL NARRATIVE: She was admitted on 07/05/18 for a left temporal craniotomy for resection of a left tentorial meningioma.  The operation was completed by Dr. Margart Sickles without complications. Postoperatively she developed a hematoma in the tumor bed  and returned to the OR for a craniectomy and evacuation.  She remained intubated with  a GCS of 1/5/1T. Repeat imaging was stable, showing persistent cerebral edema and hemorrhage at the tumor resection site.  Final pathology came back was a WHO grade I meningioma.     Trauma surgery was consulted, and she was taken to the OR for a trach/PEG placement on 07/15/18.  The operation was completed by Dr. Darlyn Read without complications.      At this time she was started on Amantadine for neuro-stimulation on 07/16/18.  She was also started on Keppra, Vimpat, Dilantin per neurology after vEEG was completed.  She was then experiencing multiple seizure events on 12/25 so Clobazam was added.      CTV intra w/wo done on 07/20/18 demonstrated a sub-occlusive thrombus in the left transverse sinus. On 12/30 there was concern for septic shock for which antibiotics broadened for empiric coverage. Infectious work-up ordered, central line placed, Foley catheter placement and FMS placed. Levophed started for mild hypotension. Ultrasound of the right axilla obtained demonstrating concern for cellulitis.  Blood cultures were NGTD after 5 days.    Neurology evaluated her current AED course, and recommend maintaining the current medications she was on.  She was otherwise doing well and was awaiting placement in a long term care facility.    The patient was determined safe for discharge on 08/07/18.  Prior to discharge the patient was tolerating a diet, her pain was well controlled on oral pain medications, and she was ambulating well and physical therapy felt she was safe for discharge to an LTACH.  The patient will follow up with Neurosurgery in 4 weeks for reevaluation.  All questions were answered prior to discharge and the patient agreed to be discharged at this time. The patient was instructed to follow up sooner for new or concerning symptoms.       TRANSITION/POST DISCHARGE CARE/PENDING TESTS/REFERRALS:   -- Follow up with Neurosurgery  in 4 weeks with a repeat CT brain and VT venogram  -- Follow up with Neurology in 4 weeks with Dr. Alfredia Ferguson   - Current AEDs:     -- levetiracetam (Keppra)1500 mg 2x/day   -- Lacosamide (Vimpat)200 mg 2x/day   -- phenytoin (Dilantin)100 mg q8h   -- Clobazam (Onfi)20 mg q8h  -- Follow up with Trauma surgery in 2 weeks      CONDITION ON DISCHARGE:  A. Ambulation: unable  B. Self-care Ability: Incapable  C. Cognitive Status Alert and Oriented x 3  D. Code status at discharge:   Code Status Information     Code Status    Full Code          LINES/DRAINS/WOUNDS AT DISCHARGE:   Patient Lines/Drains/Airways Status    Active Line / Dialysis Catheter / Dialysis Graft / Drain / Airway / Wound     Name: Placement date: Placement time: Site: Days:    Peripheral IV Left Median Antebrachial  08/06/18   0530   1    Gastrostomy Tube Center  07/15/18   -   23    Tracheostomy 6 Cuffed  07/15/18   1241   23    Wound (Non-Surgical) Perineum;Groin;Thigh  07/19/18   0800   19    Surgical Incision Left Head  07/05/18   1129   33                DISCHARGE DISPOSITION:  Home discharge and LTACH    NEUROSURGERY RISK FACTORS:  Cerebral Edema  Cerebral Hemorrhage:  Hemorrhagic  Postoperative  Cranietomy  Seizure-Other  DISCHARGE INSTRUCTIONS:  Follow-up Information     Tra[facility] .    Specialty:  Trauma Surgery  Contact information:  [address]  [place] [address]  [phone]  Additional information:  For driving directions to the [facility] in [place], please call 1-855-[place]-CARE [phone]). You may also visit our website at [email or web address]*Valet parking is available to patients at [facility] outpatient clinics for free and tipping is not required.*Visitors to our main campus will Location manager as we are expanding to better serve you. We apologize for any inconvenience this may cause and appreciate  your patience.           Neurology Clinic, [facility] .    Specialty:  Neurology  Contact information:  [address]  [place] [address]  [phone]  Additional information:  For driving directions to the Neurology Clinic, located within the [facility] [place],  please call [phone]-CARE [phone]) or you may visit our website at [email or web address]*Valet parking is available to patients at [facility] outpatient clinics for free and tipping is not required.*Visitors to our main campus will Location manager as we are expanding to better serve you. We apologize for any inconvenience this may cause and appreciate your patience.           Neurosurgery Clinic, [place] .    Specialty:  Neurosurgery  Contact information:  [address]  [place] [address]  [phone]  Additional information:  For driving directions to the [facility] in [place], please call 1-855-[place]-CARE [phone]). You may also visit our website at [email or web address]*Valet parking is available to patients at [facility] outpatient clinics for free and tipping is not required.*Visitors to our main campus will Location manager as we are expanding to better serve you. We apologize for any inconvenience this may cause and appreciate your patience.                  CT BRAIN WO IV CONTRAST    Please schedule in 4 weeks in conjunction with the neurosurgery follow up appt.     Reason for Exam: s/p L temporal craniotomy    What is the patient's weight in lbs? 89.8 kg (197 lb 15.6 oz) (07/27/2018)      CT VENOGRAM HEAD W/WO IV CONTRAST    Please schedule in conjunction with the neurosurgery appointment in 4 weeks     Reason for Exam: sinus thrombosis    What is the patient's weight in lbs? 90.6 kg (199 lb 11.8 oz) (08/03/2018)    Is the patient allergic to IV contrast? No      DISCHARGE INSTRUCTION - MISC     DISCHARGE INSTRUCTION -  MISC    Apply bacitracin or neosporin ointment twice daily to incision.    Diet: tube feed bolus q4h while awake. Osmolite 1.5 infusion should take 30-60 minutes for 1 can    Please give all medications through the PEG tube    Patient is currently on Lovenox for DVT ppx, this may be managed by the receiving facility.      Continue Aspirin 325mg  daily for treatment of transverse sinus thrombosis.  This may also be managed at the discretion of the receiving facility.    Continue to hold Coreg, it was held while in the hospital due to the patient being hypotensive.  This may be managed at the discretion of the receiving facility.  Please ensure patient wears her craniectomy helmet whenever out of bed.     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 Alfredia Ferguson      SCHEDULE FOLLOW-UP NEUROSURGERY - [facility]     Follow-up in: 4 WEEKS    Reason for visit: HOSPITAL DISCHARGE    Follow-up reason: L tentorial meningioma s/p L temporal craniotomy for resection w/ post operative hemorrhage s/p L decompressive craniectomy    Coordination with other outpt. appts.? YES (provide details in comment section & place separate orders for those items) CT brain   Provider: Dr. Jerrilyn Cairo, PA-C  08/07/2018, 15:37      Copies sent to Care Team       Relationship Specialty Notifications Start End    Isac Caddy, MD PCP - General EXTERNAL  03/14/18     Phone: [phone] Fax: [phone]         [place] [address]            Referring providers can utilize [email or web address] to access their referred [place] patient's information.

## 2018-08-06 NOTE — Nurses Notes (Signed)
pt given medication and feeding at this time.  talking to pt as work being done, no signs of response until RN went to do mouth care, and pt was clenching teeth, no signs of pain, but did give tylenol just in case.  This may have been sign of pain, and not just clenching teeth to prevent mouth care.

## 2018-08-06 NOTE — Ancillary Notes (Signed)
Pt was seen for adjustment to protective  Helmet,padding was added to give better fit helmet was left at bedside to be donned when OOB.            [place]  [place]

## 2018-08-06 NOTE — Progress Notes (Signed)
[facility]  NEUROSURGERY   PROGRESS NOTE    [name] [name], 58 y.o. female  Date of Admission:  07/05/2018  Date of Service: 08/07/2018  Date of Birth:  December 25, 1960    Chief Complaint: right upper and lower extremity weakness    Subjective: Grimacing to pain.  Localizing w/ LUE.  Family with multiple questions regarding follow-up plans.    Vital Signs:  Temp (24hrs) Max:38 C (426.8 F)      Systolic (34HDQ), QIW:979 , Min:99 , GXQ:119     Diastolic (41DEY), CXK:48, Min:64, Max:87    Temp  Avg: 37.2 C (99 F)  Min: 36.8 C (98.2 F)  Max: 38 C (100.4 F)  MAP (Non-Invasive)  Avg: 85.3 mmHG  Min: 76 mmHG  Max: 103 mmHG  Pulse  Avg: 98.2  Min: 84  Max: 105  Resp  Avg: 18  Min: 18  Max: 18  SpO2  Avg: 92 %  Min: 87 %  Max: 96 %  Pain Score (Numeric, Faces): Other    Min/Max/Avg ICP/CPP last 24hrs:   No data recorded    Today's Physical Exam:    Appears acutely ill, intubated  GCS 1 5 1  T  -- Eyes do not open to noxious stimuli  -- Localizes in LUE, Withdraws in RUE and BLE   -- Attempts to localize w/ RUE but does not cross midline  Pupils 4 mm L sluggish R brisk  Face symmetric  +cough    Unable to assess hearing  Unable to assess speech  Unable to assess fund of knowledge  Unable to assess attention span & concentration  Unable to assess recent and remote memory  CN 3 4 6  EOMI Unable to assess  CN 11 shrug symmetric Unable to assess  Muscle Strength Unable to assess  Unable to assess drift    -- L fronto-temporal incision c/d/i, staples removed  -- JP drain Rapide  -- LD Rapide    Assessment/Plan:  [name] [name] is a 58 yo F PMH HTN, HLD w/ 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 POD33/33.  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   -- CTV intra w/wo (07/20/18): sub-occlusive left transverse sinus thrombus   -- US soft tissue axilla (07/20/18): nonspecific area of increased echogenicity seen at the site of concern in the right axilla measuring approximately 6.5 x 2.0 x 8.9 cm; appears nonmass-like with hazy borders and is likely related to cellulitis   -- MRI-brain w/wo (07/20/18): possible non-occlusive thrombus in transverse sinus   -- CT-brain wo (07/19/18): interval decrease of hematoma   -- Venous duplex BLE (07/16/18): left peroneal (calf vein) DVT   -- Venous duplex BUE (07/16/18): no evidence of DVT   -- TTE (07/12/18): EF 75%, no regional wall abnormalities   -- CT brain w/o (07/11/18):stable    -- CT brain w/o (07/09/18 @ 1328):stable   -- CT brain  w/o (07/09/18 @ 0430):stable   -- CT brain w/o (07/08/18):postop changes from decompressive craniectomy with interval mild extracalvarial brain herniation through the craniectomy site; mild increase in cerebral edema at the tumor resection bed and adjacent parenchyma without significant increase in hemorrhage   -- MRI brain w/wo (07/06/18):gross total resection of a left medial tentorial meningioma; hemorrhagic productswithin the resection cavity extending into the parenchyma of the left thalamus and tracking down within the left midbrain and extending to the fourth ventricle   -- CT brain w/o (07/06/18): postop changes from left decompressive craniectomy for evacuation of hematoma of tumor bed with residual hemorrhage and improved edema and rightward midline shift   -- CT brain w/o (07/06/18): postop changes from decompressive craniectomy  for evacuation of tumor bed hematoma with minimal residual blood at the surgical site   -- CT brain w/o (07/05/18): postop changes with development of hematoma at tumor bed and 8 mm of midline shift  -- Pain/spasm control: Tylenol PRN, Motrin PRN  -- Diet: NPO + Osmolite TF  -- Bowel regimen held, last BM 08/06/18  -- Abx: none  -- Activity restrictions   -- Craniectomy precautions, helmet to be worn at all times when OOB  -- PT/OT: SNF vs LTAC  -- DVT ppx: Lovenox, SCDs/Venodynes  -- Consults:    NCCU/Neurology   -- Keppra1500 mg 2x/day   -- Vimpat 200 mg 2x/day   -- Dilantin 100 mg q8h; continue current dose despite being non-therapeutic (07/31/18)   -- Clobazam 20 mg q8h   -- FU in 4 weeks with Dr. Shelton Silvas   -- Signed off   Trauma - constipation   -- Trach/PEG completed 07/15/18   -- Tracheostomy plate sutures removed 07/23/18; stay sutures removed and place for 2-week follow-up (order placed)   -- Aggressive bowel regimen to alleviate stool burden; resume tube feeds with return of bowel function  -- Lines/Drains: Trach/PEG  -- Wound: L fronto-temporal incision (staples removed), JP drain Rapide, LD Rapide   -- Please bathe patient daily and clean wound daily    -- Okay for soap/shampoo   -- Apply bacitracin ointment BID  -- Disposition: ongoing, choice obtained for [facility]   -- Will need follow-up CT-venogram to assess non-occlusive thrombus in transverse sinus    Reola Calkins, MD, PhD  Department of Neurosurgery  PGY5      I saw and examined the patient.  I reviewed the resident's note.  I agree with the findings and plan of care as documented in the resident's note.  Any exceptions/additions are edited/noted.    [name], MD

## 2018-08-06 NOTE — Care Management Notes (Signed)
[facility]  Care Management Note    Patient Name: [name]  Date of Birth: 20-Nov-1960  Sex: female  Date/Time of Admission: 07/05/2018  7:26 AM  Room/Bed: 929/A  Payor: HEALTH PLAN / Plan: HEALTH PLAN (PPO-NON ST EMP) / Product Type: PPO /    LOS: 32 days   Primary Care Providers:  Isac Caddy, MD, MD (General)    Admitting Diagnosis:  Meningioma (CMS [facility]) [D32.9]    Assessment:      08/06/18 1416   Assessment Details   Assessment Type Continued Assessment   Date of Care Management Update 08/06/18   Date of Next DCP Update 08/09/18   Care Management Plan   Discharge Planning Status plan in progress   Projected Discharge Date 08/06/18   Discharge plan discussed with: MPOA   CM will evaluate for rehabilitation potential yes   Patient choice offered to patient/family yes   Form for patient choice reviewed/signed and on chart yes   Facility or Agency Preferences [facility]   Discharge Needs Assessment   Discharge Facility/Level of Care Needs LTAC (code 49)       Discharge Plan:  LTAC (code 4)  [place] met with MPOA to review [place] form. Signed FOC form on chart for scanning. Uncompleted [employer] paperwork (for guardianship/conservator) given back to MPOA and discussed option of completion with licensed physician either here at [facility] or at an outside facility. MPOA verbalized understanding. Discharge to LTAC pending auth, potential plan for discharge in next 24 hours pending auth.    The patient will continue to be evaluated for developing discharge needs.     Case Manager: Genella Rife, [place] COORDINATOR  Phone: [phone]

## 2018-08-06 NOTE — Respiratory Therapy (Addendum)
Patient was found to be on a 21% ATC with an SpO2 of 87%. Nursing suctioned with no improvement. Patient changed to a 35% ATC with an SpO2 of 92%. Will continue to monitor.

## 2018-08-07 LAB — POC BLOOD GLUCOSE (RESULTS)
GLUCOSE, POC: 143 mg/dL — ABNORMAL HIGH (ref 70–105)
GLUCOSE, POC: 105 mg/dL (ref 70–105)

## 2018-08-07 MED ORDER — ASCORBIC ACID (VITAMIN C) 500 MG TABLET: 500 mg | Freq: Two times a day (BID) | ORAL | 0 refills | 0 days | Status: AC

## 2018-08-07 MED ORDER — INSULIN LISPRO 100 UNIT/ML SUB-Q SSIP: 0 [IU] | SUBCUTANEOUS | 0 refills | 0 days | Status: AC | PRN

## 2018-08-07 MED ORDER — PHENYTOIN 100 MG/4 ML ORAL SUSPENSION: 100 mg | Freq: Three times a day (TID) | ORAL | 0 refills | 0 days | Status: AC

## 2018-08-07 MED ORDER — LACOSAMIDE 200 MG TABLET
200.00 mg | ORAL_TABLET | Freq: Two times a day (BID) | ORAL | Status: AC
Start: 2018-08-07 — End: ?

## 2018-08-07 MED ORDER — ENOXAPARIN 40 MG/0.4 ML SUBCUTANEOUS SYRINGE: 40 mg | SUBCUTANEOUS | 0 refills | 0 days | Status: AC

## 2018-08-07 MED ORDER — BACITRACIN 500 UNIT/G OINTMENT TUBE: Freq: Two times a day (BID) | CUTANEOUS | 0 refills | 0 days | Status: AC

## 2018-08-07 MED ORDER — CLOBAZAM 2.5 MG/ML ORAL SUSPENSION: 20 mg | Freq: Three times a day (TID) | ORAL | 0 refills | 0 days | Status: AC

## 2018-08-07 MED ORDER — LEVETIRACETAM 750 MG TABLET: 1500 mg | Freq: Two times a day (BID) | ORAL | 0 refills | 0 days | Status: AC

## 2018-08-07 MED ORDER — NYSTATIN 100,000 UNIT/ML ORAL SUSPENSION: 5 mL | Freq: Four times a day (QID) | ORAL | 0 refills | 0 days | Status: AC

## 2018-08-07 MED ORDER — NICOTINE 7 MG/24 HR DAILY TRANSDERMAL PATCH
7.0000 mg | MEDICATED_PATCH | Freq: Every day | TRANSDERMAL | Status: DC
Start: 2018-08-08 — End: 2019-01-08

## 2018-08-07 MED ORDER — ASPIRIN 325 MG TABLET
325.0000 mg | ORAL_TABLET | Freq: Every day | ORAL | Status: DC
Start: 2018-08-08 — End: 2019-01-15

## 2018-08-07 MED ORDER — NUTRITION PROTEIN SUPPLEMENT - TUBE FEED
1.0000 | Freq: Every day | Status: DC
Start: 2018-08-08 — End: 2019-01-08

## 2018-08-07 MED ORDER — NYSTATIN 100,000 UNIT/GRAM TOPICAL POWDER: 1 [IU] | Freq: Two times a day (BID) | CUTANEOUS | 0 refills | 0 days | Status: AC

## 2018-08-07 MED ADMIN — levETIRAcetam 500 mg tablet: GASTROSTOMY | @ 21:00:00

## 2018-08-07 MED ADMIN — amino ac-protein hydro-whey protein 10 gram-100 kcal/30 mL oral liquid: GASTROSTOMY | @ 09:00:00

## 2018-08-07 MED ADMIN — enoxaparin 40 mg/0.4 mL subcutaneous syringe: SUBCUTANEOUS | @ 18:00:00

## 2018-08-07 MED ADMIN — magnesium citrate oral solution: GASTROSTOMY | @ 09:00:00

## 2018-08-07 MED ADMIN — aspirin 325 mg tablet: GASTROSTOMY | @ 10:00:00

## 2018-08-07 MED ADMIN — chlorhexidine gluconate 0.12 % mouthwash: ORAL | @ 21:00:00

## 2018-08-07 MED ADMIN — silver sulfADIAZINE 1 % topical cream: TOPICAL | @ 10:00:00

## 2018-08-07 MED ADMIN — scopolamine 1 mg over 3 days transdermal patch: GASTROSTOMY | @ 10:00:00

## 2018-08-07 MED ADMIN — fentaNYL (PF) 50 mcg/mL injection solution: GASTROSTOMY | @ 10:00:00

## 2018-08-07 MED ADMIN — lactated Ringers intravenous solution: TOPICAL | @ 10:00:00 | NDC 00338011704

## 2018-08-07 NOTE — Nurses Notes (Signed)
Patient remains unresponsive and only withdraws from pain. Patient does not allow oral care to be done, she clenches her teeth and will not relax to allow staff to do. I was able to suction the insides of her cheeks but could not go any further. Medications given through her G tube as ordered and feeding also given as well.     Changed dressing on trach and suctioned as well. Patient tolerated well. Patient has been cleaned up several times this morning for incontinence. Family is at the bedside. Call bell remains in reach.     Patient is scheduled to go to [facility] facility later today. Will continue to monitor.

## 2018-08-07 NOTE — Care Management Notes (Signed)
CCC spoke to [place] [phone]), Josem Kaufmann is still pending. CCC to follow up again later today.

## 2018-08-07 NOTE — Care Plan (Signed)
pt transport to [facility]

## 2018-08-07 NOTE — Care Management Notes (Signed)
CCC spoke to [employer] [phone]) this morning, Josem Kaufmann is still pending. Service, bedside nurse, and family updated. Waiting for auth to LTAC.

## 2018-08-07 NOTE — Care Management Notes (Signed)
CCC confirmed auth received for LTAC. Service and bedside nurse updated, plan for discharge this evening to [facility] in [place], [place]. [facility] updated MPOA, MPOA verbalized understanding. Ambulance scheduled for 7pm to LTAC.

## 2018-08-07 NOTE — Care Plan (Signed)
pt appears to be asleep, but doesn't wake for care.  orders are as given.    Problem: Adult Inpatient Plan of Care  Goal: Plan of Care Review  Outcome: Ongoing (see interventions/notes)  Goal: Patient-Specific Goal (Individualization)  Outcome: Ongoing (see interventions/notes)  Goal: Absence of Hospital-Acquired Illness or Injury  Outcome: Ongoing (see interventions/notes)  Goal: Optimal Comfort and Wellbeing  Outcome: Ongoing (see interventions/notes)  Goal: Rounds/Family Conference  Outcome: Ongoing (see interventions/notes)

## 2018-08-07 NOTE — Care Plan (Addendum)
[facility]  Medical Nutrition Therapy Follow Up    SUBJECTIVE: Pt unable to answer any questions at time of visit. Family present. Denied issues with TF such as N/V or bloating. Possible D/c to LTAC today. Weight up 27# past 1 month standing vs bedscale.    OBJECTIVE:     Current Diet Order/Nutrition Support:  MNT PROTOCOL FOR DIETITIAN  ADULT TUBE FEED - BOLUS Q4H WHILE AWAKE NO MEALS, TF ONLY; OSMOLITE 1.5; NG; INTERMITTENT - INFUSION SHOULD TAKE 30 - 60 MINUTES; Bolus Amount: 1 can at 0500, 0900, 1300, 1700, 2200  DIET NPO - SPECIFIC DATE & TIME EXCEPT ALL MEDS WITH SIPS OF WATER, EXCEPT TUBE FEEDS     Height Used for Calculations: 167.6 cm (5' 5.98")  Weight Used For Calculations: 78.2 kg (172 lb 6.4 oz)(standing weight upon admission)  BMI (kg/m2): 27.9  BMI Assessment: BMI 25-29.9: overweight  Ideal Body Weight (IBW) (kg): 59.54  % Ideal Body Weight: 131.34  Adjusted/Standard Body Weight  Adjusted BW: 64 kg          Re-assessed needs if applicable:    Energy Calorie Requirements: 1900-2100(30-33 kcal/64 kg Adj BW)   Protein Requirements (gms/day): 78-90(1.3-1.5 gm/60 kg IBW)   Fluid Requirements: 1900-2100(1 ml/kcal)       Plan/Interventions :  1. Continue intermittent bolus Tube feeds of Osmolite 1.5, increase to 5.5 cans/day and D/C prosource                1980 calories =31 calories/kg                83 grams protein = 1.4 g/kg               1007 ml. Additional 200 ml water recommended every 6 hours per day (1807 ml water total per day)    Hold tube feeds 1 hour before and after Dilantin administration     2. Monitor I/Os for hydration status and feeding tolerance   3. Continue Vitamin C as appropriate, add daily MVI   4. Daily weight   5. Monitor blood sugars and adjust insulin as needed - 98-154 mg/dL past 24 hours     Nutrition Diagnosis:Swallowing difficultyrelated to trach in place, decreased mental statusas evidenced by Need for TF(addendum;in progress)      Alba Destine, RDLD  08/07/2018, 15:11  Pager # [phone]    Problem: Adult Inpatient Plan of Care  Goal: Plan of Care Review  Outcome: Ongoing (see interventions/notes)     Problem: Feeding Intolerance (Enteral Nutrition)  Goal: Feeding Tolerance  Outcome: Ongoing (see interventions/notes)  Intervention: Prevent and Manage Feeding Intolerance  Flowsheets (Taken 08/07/2018 1510)  Nutrition Support Management: enteral feeding continued

## 2018-08-07 NOTE — Discharge Instructions (Signed)
Discharge Recommendations/ Plan:Discharge PS:UGAY (code 63)      Please call report to [place] [phone]

## 2018-08-07 NOTE — Nurses Notes (Signed)
pt medications called to TARA  at [facility], [phone] These were medicsations given between 1900-2130

## 2018-08-09 ENCOUNTER — Other Ambulatory Visit (HOSPITAL_BASED_OUTPATIENT_CLINIC_OR_DEPARTMENT_OTHER): Payer: Self-pay | Admitting: Radiation Oncology

## 2018-08-09 ENCOUNTER — Other Ambulatory Visit (HOSPITAL_BASED_OUTPATIENT_CLINIC_OR_DEPARTMENT_OTHER): Payer: Self-pay | Admitting: Anesthesiology

## 2018-08-09 NOTE — Care Management Notes (Signed)
Referral Information  ++++++ Placed Provider #1 ++++++  Case Manager: Genella Rife  Provider Type: [facility]  Provider Name: [facility]  Address:  [address] [facility] [place] [address]  Contact: Creta Levin    Phone: [phone] x  Fax:   Fax: [phone]

## 2018-08-10 ENCOUNTER — Other Ambulatory Visit (HOSPITAL_BASED_OUTPATIENT_CLINIC_OR_DEPARTMENT_OTHER): Payer: Self-pay | Admitting: Hematology & Oncology

## 2018-08-10 DIAGNOSIS — D329 Benign neoplasm of meninges, unspecified: Secondary | ICD-10-CM

## 2018-08-19 ENCOUNTER — Encounter: Payer: Self-pay | Admitting: FAMILY PRACTICE

## 2018-08-22 ENCOUNTER — Encounter (INDEPENDENT_AMBULATORY_CARE_PROVIDER_SITE_OTHER): Payer: Self-pay

## 2018-08-23 ENCOUNTER — Ambulatory Visit (HOSPITAL_BASED_OUTPATIENT_CLINIC_OR_DEPARTMENT_OTHER): Payer: PPO

## 2018-08-23 ENCOUNTER — Ambulatory Visit (HOSPITAL_BASED_OUTPATIENT_CLINIC_OR_DEPARTMENT_OTHER): Payer: PPO | Admitting: Hematology & Oncology

## 2018-08-27 ENCOUNTER — Encounter (HOSPITAL_BASED_OUTPATIENT_CLINIC_OR_DEPARTMENT_OTHER): Payer: Self-pay | Admitting: Hematology & Oncology

## 2018-08-27 ENCOUNTER — Encounter (INDEPENDENT_AMBULATORY_CARE_PROVIDER_SITE_OTHER): Payer: Self-pay

## 2018-08-27 NOTE — Nursing Note (Signed)
No showed new patient appointment 08/23/18. In basket message sent to referral coordinator, Judeen Hammans, to assist with rescheduling appointment.

## 2018-08-28 ENCOUNTER — Ambulatory Visit (INDEPENDENT_AMBULATORY_CARE_PROVIDER_SITE_OTHER): Payer: Self-pay

## 2018-08-28 ENCOUNTER — Encounter (HOSPITAL_BASED_OUTPATIENT_CLINIC_OR_DEPARTMENT_OTHER): Payer: Self-pay | Admitting: Hematology & Oncology

## 2018-08-28 ENCOUNTER — Ambulatory Visit (INDEPENDENT_AMBULATORY_CARE_PROVIDER_SITE_OTHER): Payer: Self-pay | Admitting: Anesthesiology

## 2018-08-28 NOTE — Nursing Note (Signed)
[name]  MRN:   [id number]  Female, 58 y.o., [birth year]  County:   [place] Phone:   [phone]  Home:   [phone]  Mobile:   [phone]  Work:   [phone]  Last Weight:   90.6 kg (199 lb 11.8 oz)  PCP:   Isac Caddy, MD  Next Appt:   10/15/2018  Next Appt with Me:   None  Language:   English  Allergies  No Known Allergies  Primary Ins:   HEALTH P  [facility]:   Active  Pregnancy Status:   None  Current GA:   None  Message   Received: Today   Message Contents   Amanda Cockayne, RN            [facility],     Patient is in a care facility and is not able to come to appointments right now.     Sherry~    Previous Messages      ----- Message -----   From: Eligha Bridegroom, RN   Sent: 08/27/2018  1:52 PM EST   To: [name]     No showed new patient visit 08/23/18.   Thanks

## 2018-08-28 NOTE — Telephone Encounter (Signed)
Anything you need scheduled?  I will cancel appt

## 2018-08-28 NOTE — Telephone Encounter (Signed)
-----   Message from Sarina Ser sent at 08/28/2018 11:10 AM EST -----  [facility] with [facility] states that the pt is currently at their facility. He is calling in regards to the pt's appt's on 2.11.20, and states that they are an acute care facility so they probably wouldn't be able to get the pt to those appts. He states that the pt is currently non responsive, has a trach, and a feeding tube. He states that it would be very hard on the pt to come here as well. He would like to know if there is any testing that they could do there for the pt instead of her traveling here. Please call [name] back at [phone] to discuss. Thanks

## 2018-08-28 NOTE — Telephone Encounter (Addendum)
-----   Message from Sarina Ser sent at 08/28/2018 11:11 AM EST -----  [facility] with [facility] states that the pt is currently at their facility. He is calling in regards to the pt's appt's on 2.11.20, and states that they are an acute care facility so they probably wouldn't be able to get the pt to those appts. He states that the pt is currently non responsive, has a trach, and a feeding tube. He states that it would be very hard on the pt to come here as well. He would like to know if there is any testing that they could do there for the pt instead of her traveling here. Please call [name] back at [phone] to discuss. Thanks   ------------------------------------------  Returned call and spoke with Ellison Hughs NP regarding patient.  Related to patient status, facility will obtain a CT brain wo contrast and send disc with report to [place] for viewing.  Ronalee Belts then nurse practitioner will remove incision staples and medically manage.  Per Dr. Margart Sickles this will suffice until patient able to attend RPV here.  CT venogram and RPV rescheduled for 10/15/18 with [name] at facility.    Jenita Seashore, RN  7/0/0525, 12:53

## 2018-08-30 NOTE — Telephone Encounter (Signed)
Called [place] and lmom

## 2018-08-30 NOTE — Telephone Encounter (Signed)
Jaynee Eagles, MD                Not at this time that I would need her facility to perform. Thanks     Jaynee Eagles

## 2018-09-03 ENCOUNTER — Other Ambulatory Visit (HOSPITAL_COMMUNITY): Payer: Self-pay

## 2018-09-03 ENCOUNTER — Encounter (INDEPENDENT_AMBULATORY_CARE_PROVIDER_SITE_OTHER): Payer: Self-pay | Admitting: Anesthesiology

## 2018-09-03 ENCOUNTER — Encounter (INDEPENDENT_AMBULATORY_CARE_PROVIDER_SITE_OTHER): Payer: Self-pay

## 2018-09-17 NOTE — Progress Notes (Signed)
MULTIDISCIPLINARY BRAIN TUMOR BOARD DISCUSSION:    PATIENT:   [name] NUMBER:   [id number]  DOB:    07/05/1961  AGE:   58 y.o.  DATE:   09/17/2018    REFERRING PROVIDER: No ref. 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 Margart Sickles at [place]: No  ELIGIBLE CLINICAL TRIALS: No    NEED FOR PALLIATIVE CARE?: No  PSYCHOSOCIAL CONCERNS?: No  REHABILITATION CONCERNS?: No   NUTRITIONAL CONCERNS?:  No     PATIENT NAVIGATOR DISCUSSED OUTREACH ACTIVITIES/PROGRAMS?:  No  PATIENT NAVIGATOR PROVIDED EDUCATIONAL MATERIAL TO PATIENT?:  No    THE PATIENT'S MOST RECENT CLINICAL INFORMATION, IMAGING, AND PATHOLOGY WAS DISCUSSED TODAY AT [facility] MULTIDISCIPLINARY BRAIN TUMOR BOARD BY THE MEDICAL ONCOLOGY, RADIATION ONCOLOGY, SURGERY, RADIOLOGY, PATHOLOGY, SOCIAL SERVICES, REHAB, AND NURSING SERVICES.    PRELIMINARY RECOMMENDATIONS BASED ON TODAY'S TUMOR BOARD DISCUSSION:   -No role for adjuvant therapy at this time  -Observation    York Pellant 09/17/2018 12:30  Oncology Tumor Board Coordinator

## 2018-10-10 ENCOUNTER — Ambulatory Visit (INDEPENDENT_AMBULATORY_CARE_PROVIDER_SITE_OTHER): Payer: Self-pay | Admitting: Anesthesiology

## 2018-10-10 NOTE — Telephone Encounter (Addendum)
Regarding: Dr. Margart Sickles  ----- Message from Myrtha Mantis sent at 10/10/2018  3:05 PM EDT -----  Dr. Gaspar Garbe calling from [facility] to see if pt will need to keep apt for testing and Dr. Margart Sickles on 3/24, he said they are going to be sending her to another facility at some point & needs to know what to do about the apt.  Please call him at [phone]  ---------------------------------------------  Returned call and spoke with [name], Informed him that patient will need to keep follow up appointment since she had not been seen and evaluated since discharge.  [name] verbalized understanding.  Instructed [name] that if our plans change related to unforseen circumstances we will contact by 10/14/18

## 2018-10-14 ENCOUNTER — Encounter (INDEPENDENT_AMBULATORY_CARE_PROVIDER_SITE_OTHER): Payer: Self-pay | Admitting: Family

## 2018-10-14 NOTE — Progress Notes (Deleted)
Spoke with patient via phone regarding her concerns about the injection she received. Re-assured patient that she was ordered the correct injection and Dr. Freddi Che charted that this injection was completed. She is still upset and thinks that Dr. Skeet Simmer described a different type of injection. Will have Dr. Skeet Simmer call the patient later in the week. Patient verbalizes understanding and is OK with waiting for a call from Dr. Skeet Simmer.   Luisa Dago, APRN,NP-C  10/14/2018, 14:47

## 2018-10-15 ENCOUNTER — Ambulatory Visit (INDEPENDENT_AMBULATORY_CARE_PROVIDER_SITE_OTHER): Payer: Self-pay | Admitting: Anesthesiology

## 2018-10-15 ENCOUNTER — Encounter (INDEPENDENT_AMBULATORY_CARE_PROVIDER_SITE_OTHER): Payer: Self-pay | Admitting: Anesthesiology

## 2018-10-15 ENCOUNTER — Other Ambulatory Visit (HOSPITAL_COMMUNITY): Payer: Self-pay

## 2018-10-15 NOTE — Telephone Encounter (Addendum)
09/25/18: I spoke with NP [name] @ [phone] at the facility that Ferris is currently at. I adv that Dr. Margart Sickles didn't think it was best to transport [place] here w/having a trach in, on O2, unresponsive during the COVID-19. Dr Margart Sickles recommended that the CT Brain disc be mailed to the clinic in which he will review it and then can have a teleconference or televideo with Paulena's daughter [name].  [name] said that he agreed with Dr. Laroy Apple recommendation, he took down the address, and stated that he would send it asap.  [name] also provided me an updated number for [facility] @ [phone]. I updated the patient's account to reflect the new change.  I contacted Briana-Philicia's daughter.  I informed her of the same information per Dr. Margart Sickles and that we would be cancelling the appointment for 10/21/18. She verbalized understanding.  I Salena Saner that NP [name] is going to have a CT completed tonight-10/15/18 because the patient is being moved tomorrow-10/16/18.  [name] was inquiring that she had some concerns in regards to her mom. [name] wanted to know if there are any insights or thoughts on when they are planning on "waking her mom up?" She stated that her mom's appointment with Neurology was also cancelled to see Dr. Tera Helper and she said she was originally advised that it would be 3 1/2 to 4 months for them to check her mom and then attempt to wake her up. [name] stated it has been past this time frame. I sent an in basket message to Dr. Margart Sickles and RN Kathalene Frames to contact the patient.jlm

## 2018-10-16 ENCOUNTER — Telehealth (INDEPENDENT_AMBULATORY_CARE_PROVIDER_SITE_OTHER): Payer: Self-pay

## 2018-10-16 NOTE — Telephone Encounter (Signed)
-----   Message from Nolen Mu, RN sent at 10/15/2018  4:50 PM EDT -----  This was scheduled on Dr.Carper's appointment and canceleld due to hopsitalization- can you look into this.     Randall Hiss  ----- Message -----  From: Lesli Albee, [place]  Sent: 10/15/2018   3:16 PM EDT  To: [name], MD, Nolen Mu, RN    09/25/18: I spoke with NP [name] @ [phone] at the facility that [name] is currently at. I adv that Dr. Margart Sickles didn't think it was best to transport [place] here w/having a trach in, on O2, unresponsive during the COVID-19. Dr Margart Sickles recommended that the CT Brain disc be mailed to the clinic in which he will review it and then can have a teleconference or televideo with [name] daughter [name].  [name] said that he agreed with Dr. Laroy Apple recommendation, he took down the address, and stated that he would send it asap.  [name] also provided me an updated number for [name] @ [phone]. I updated the patient's account to reflect the new change.  I contacted [name]'s daughter.  I informed her of the same information per Dr. Margart Sickles and that we would be cancelling the appointment for 10/21/18. She verbalized understanding.  I Salena Saner that NP [name] is going to have a CT completed tonight-10/15/18 because the patient is being moved tomorrow-10/16/18.  [name] was inquiring that she had some concerns in regards to her mom. [name] wanted to know if there are any insights or thoughts on when they are planning on "waking her mom up?" She stated that her mom's appointment with Neurology was also cancelled to see Dr. Tera Helper and she said she was originally advised that it would be 3 1/2 to 4 months for them to check her mom and then attempt to wake her up. [name] stated it has been past this time frame. I sent an in basket message to Dr. Margart Sickles and RN Kathalene Frames to contact the patient.jlm      #Dr. Margart Sickles NP Melinda Crutch adv me to inbasket you to contact the  daughter to discuss your position and part with her mothers care.    #Mr. Dunithan-I explained to you earlier so you know what's up sir.      Thank you both greatly,   Jessi

## 2018-10-16 NOTE — Telephone Encounter (Signed)
"  She stated that her mom's appointment with Neurology was also cancelled to see Dr. Tera Helper and she said she was originally advised that it would be 3 1/2 to 4 months for them to check her mom and then attempt to wake her up. [name] stated it has been past this time frame"    Dr.Carper - if she is still "asleep", we couldn't see her, could we? Could you call --[name] @ [phone]. Thanks

## 2018-10-22 ENCOUNTER — Encounter (INDEPENDENT_AMBULATORY_CARE_PROVIDER_SITE_OTHER): Payer: Self-pay | Admitting: Anesthesiology

## 2018-10-29 ENCOUNTER — Ambulatory Visit (INDEPENDENT_AMBULATORY_CARE_PROVIDER_SITE_OTHER): Payer: Self-pay | Admitting: Anesthesiology

## 2018-10-29 NOTE — Telephone Encounter (Signed)
Regarding: Margart Sickles  ----- Message from Azucena Kuba sent at 10/29/2018  1:28 PM EDT -----  Pt's daughter is returning call to [place]. [name] can be reached at [phone].

## 2018-10-29 NOTE — Telephone Encounter (Signed)
I called and left message for [name] at [phone] to please send [place] another copy of CD and reports. mcgeel 10-29-18

## 2018-11-04 ENCOUNTER — Ambulatory Visit (INDEPENDENT_AMBULATORY_CARE_PROVIDER_SITE_OTHER): Payer: Self-pay | Admitting: Anesthesiology

## 2018-11-04 NOTE — Telephone Encounter (Signed)
I called again today left message for [name] to please send [place] the cd disk along with reports. This is my second attempt to get these. mcgeel 11-04-18

## 2018-11-05 NOTE — Telephone Encounter (Signed)
[name] called me back at 8:30 he advised me that he will send out new disk ASAP. mcgeel 11-05-18

## 2018-11-26 ENCOUNTER — Ambulatory Visit (INDEPENDENT_AMBULATORY_CARE_PROVIDER_SITE_OTHER): Payer: Self-pay | Admitting: Anesthesiology

## 2018-11-26 NOTE — Telephone Encounter (Signed)
I called and left another message for [name] to please return my call in regards to getting some disk mailed to [place] for Dr.Bhatia to review. mcgeel 11-26-18

## 2018-11-27 ENCOUNTER — Ambulatory Visit (INDEPENDENT_AMBULATORY_CARE_PROVIDER_SITE_OTHER): Payer: Self-pay | Admitting: Anesthesiology

## 2018-11-27 NOTE — Telephone Encounter (Signed)
I called left message for [name] in regards to sending me disk on [place]. I had to leave message again. I then called [name] daughter to find out exactly where she had imaging done at. I was told [facility] hospital imaging was closed until tomorrow. I will try back then to see if I have any luck getting those mailed to [place]. mcgeel 11-27-18

## 2018-11-28 ENCOUNTER — Ambulatory Visit (INDEPENDENT_AMBULATORY_CARE_PROVIDER_SITE_OTHER): Payer: Self-pay | Admitting: Anesthesiology

## 2018-11-28 NOTE — Telephone Encounter (Signed)
I called [name] back again today along with [facility] trying to get imaging sent to [place]. I am not having any luck getting a hold of anyone in regards to that. mcgeel 11-28-18

## 2018-12-02 ENCOUNTER — Ambulatory Visit (INDEPENDENT_AMBULATORY_CARE_PROVIDER_SITE_OTHER): Payer: Self-pay | Admitting: Anesthesiology

## 2018-12-02 NOTE — Telephone Encounter (Signed)
I called [name] at [phone] left another message for him to call me back. I called [facility] to try and get disk. I keep getting answering machine that states to call back between 7 and 3:30 I have done that still no answer. I did try and call daughter back I just get a busy signal. mcgeel 12-02-18

## 2018-12-02 NOTE — Telephone Encounter (Signed)
I was able to get a hold of Renae the charge nurse that has been working on [facility] case. She was going to get a hold of [name] to have those images sent over ASAP. mcgeel 12-02-18

## 2018-12-24 ENCOUNTER — Other Ambulatory Visit (HOSPITAL_COMMUNITY): Payer: Self-pay

## 2018-12-24 ENCOUNTER — Encounter (INDEPENDENT_AMBULATORY_CARE_PROVIDER_SITE_OTHER): Payer: Self-pay | Admitting: Anesthesiology

## 2019-01-08 ENCOUNTER — Emergency Department (HOSPITAL_COMMUNITY): Payer: MEDICAID

## 2019-01-08 ENCOUNTER — Encounter (HOSPITAL_COMMUNITY): Payer: Self-pay

## 2019-01-08 ENCOUNTER — Emergency Department
Admission: EM | Admit: 2019-01-08 | Discharge: 2019-01-08 | Disposition: A | Discharge status: Other Acute Inpatient Hospital | Payer: MEDICAID | Source: Skilled Nursing Facility | Attending: Emergency Medicine | Admitting: Emergency Medicine

## 2019-01-08 ENCOUNTER — Other Ambulatory Visit: Payer: Self-pay

## 2019-01-08 ENCOUNTER — Inpatient Hospital Stay
Admission: EM | Admit: 2019-01-08 | Discharge: 2019-01-15 | DRG: 314 | Disposition: A | Discharge status: Hospice | Payer: MEDICAID | Source: Other Acute Inpatient Hospital | Attending: INTERNAL MEDICINE | Admitting: INTERNAL MEDICINE

## 2019-01-08 ENCOUNTER — Ambulatory Visit (INDEPENDENT_AMBULATORY_CARE_PROVIDER_SITE_OTHER): Payer: Self-pay | Admitting: Anesthesiology

## 2019-01-08 ENCOUNTER — Emergency Department (EMERGENCY_DEPARTMENT_HOSPITAL): Payer: MEDICAID

## 2019-01-08 ENCOUNTER — Inpatient Hospital Stay (HOSPITAL_COMMUNITY): Payer: MEDICAID | Admitting: PULMONARY DISEASE

## 2019-01-08 DIAGNOSIS — D329 Benign neoplasm of meninges, unspecified: Secondary | ICD-10-CM

## 2019-01-08 DIAGNOSIS — K148 Other diseases of tongue: Secondary | ICD-10-CM | POA: Insufficient documentation

## 2019-01-08 DIAGNOSIS — G919 Hydrocephalus, unspecified: Secondary | ICD-10-CM | POA: Diagnosis present

## 2019-01-08 DIAGNOSIS — R0600 Dyspnea, unspecified: Secondary | ICD-10-CM

## 2019-01-08 DIAGNOSIS — Z66 Do not resuscitate: Secondary | ICD-10-CM | POA: Diagnosis not present

## 2019-01-08 DIAGNOSIS — R569 Unspecified convulsions: Secondary | ICD-10-CM

## 2019-01-08 DIAGNOSIS — G40919 Epilepsy, unspecified, intractable, without status epilepticus: Secondary | ICD-10-CM | POA: Diagnosis present

## 2019-01-08 DIAGNOSIS — D320 Benign neoplasm of cerebral meninges: Secondary | ICD-10-CM

## 2019-01-08 DIAGNOSIS — Z6826 Body mass index (BMI) 26.0-26.9, adult: Secondary | ICD-10-CM

## 2019-01-08 DIAGNOSIS — R402350 Coma scale, best motor response, localizes pain, unspecified time: Secondary | ICD-10-CM | POA: Diagnosis not present

## 2019-01-08 DIAGNOSIS — R7881 Bacteremia: Secondary | ICD-10-CM | POA: Diagnosis present

## 2019-01-08 DIAGNOSIS — T80211A Bloodstream infection due to central venous catheter, initial encounter: Principal | ICD-10-CM | POA: Diagnosis present

## 2019-01-08 DIAGNOSIS — J9610 Chronic respiratory failure, unspecified whether with hypoxia or hypercapnia: Secondary | ICD-10-CM

## 2019-01-08 DIAGNOSIS — Z931 Gastrostomy status: Secondary | ICD-10-CM

## 2019-01-08 DIAGNOSIS — R402114 Coma scale, eyes open, never, 24 hours or more after hospital admission: Secondary | ICD-10-CM | POA: Diagnosis not present

## 2019-01-08 DIAGNOSIS — J189 Pneumonia, unspecified organism: Secondary | ICD-10-CM | POA: Diagnosis present

## 2019-01-08 DIAGNOSIS — N390 Urinary tract infection, site not specified: Secondary | ICD-10-CM | POA: Diagnosis present

## 2019-01-08 DIAGNOSIS — Z85841 Personal history of malignant neoplasm of brain: Secondary | ICD-10-CM | POA: Insufficient documentation

## 2019-01-08 DIAGNOSIS — I959 Hypotension, unspecified: Secondary | ICD-10-CM | POA: Diagnosis present

## 2019-01-08 DIAGNOSIS — B9562 Methicillin resistant Staphylococcus aureus infection as the cause of diseases classified elsewhere: Secondary | ICD-10-CM | POA: Diagnosis present

## 2019-01-08 DIAGNOSIS — Z930 Tracheostomy status: Secondary | ICD-10-CM

## 2019-01-08 DIAGNOSIS — T17490A Other foreign object in trachea causing asphyxiation, initial encounter: Secondary | ICD-10-CM | POA: Diagnosis not present

## 2019-01-08 DIAGNOSIS — Z515 Encounter for palliative care: Secondary | ICD-10-CM | POA: Diagnosis not present

## 2019-01-08 DIAGNOSIS — G935 Compression of brain: Secondary | ICD-10-CM | POA: Diagnosis present

## 2019-01-08 DIAGNOSIS — I10 Essential (primary) hypertension: Secondary | ICD-10-CM | POA: Insufficient documentation

## 2019-01-08 DIAGNOSIS — Z87891 Personal history of nicotine dependence: Secondary | ICD-10-CM | POA: Insufficient documentation

## 2019-01-08 DIAGNOSIS — R403 Persistent vegetative state: Secondary | ICD-10-CM | POA: Diagnosis present

## 2019-01-08 DIAGNOSIS — Z79899 Other long term (current) drug therapy: Secondary | ICD-10-CM | POA: Insufficient documentation

## 2019-01-08 DIAGNOSIS — R402140 Coma scale, eyes open, spontaneous, unspecified time: Secondary | ICD-10-CM | POA: Diagnosis not present

## 2019-01-08 DIAGNOSIS — R627 Adult failure to thrive: Secondary | ICD-10-CM | POA: Diagnosis present

## 2019-01-08 DIAGNOSIS — B957 Other staphylococcus as the cause of diseases classified elsewhere: Secondary | ICD-10-CM | POA: Diagnosis present

## 2019-01-08 DIAGNOSIS — F17210 Nicotine dependence, cigarettes, uncomplicated: Secondary | ICD-10-CM | POA: Insufficient documentation

## 2019-01-08 DIAGNOSIS — E876 Hypokalemia: Secondary | ICD-10-CM | POA: Diagnosis present

## 2019-01-08 DIAGNOSIS — B952 Enterococcus as the cause of diseases classified elsewhere: Secondary | ICD-10-CM | POA: Diagnosis present

## 2019-01-08 DIAGNOSIS — R0902 Hypoxemia: Secondary | ICD-10-CM

## 2019-01-08 DIAGNOSIS — Z7401 Bed confinement status: Secondary | ICD-10-CM

## 2019-01-08 DIAGNOSIS — R918 Other nonspecific abnormal finding of lung field: Secondary | ICD-10-CM

## 2019-01-08 DIAGNOSIS — R402314 Coma scale, best motor response, none, 24 hours or more after hospital admission: Secondary | ICD-10-CM | POA: Diagnosis not present

## 2019-01-08 DIAGNOSIS — R402214 Coma scale, best verbal response, none, 24 hours or more after hospital admission: Secondary | ICD-10-CM | POA: Diagnosis not present

## 2019-01-08 DIAGNOSIS — I517 Cardiomegaly: Secondary | ICD-10-CM

## 2019-01-08 DIAGNOSIS — F419 Anxiety disorder, unspecified: Secondary | ICD-10-CM | POA: Diagnosis present

## 2019-01-08 DIAGNOSIS — J9621 Acute and chronic respiratory failure with hypoxia: Secondary | ICD-10-CM | POA: Diagnosis not present

## 2019-01-08 DIAGNOSIS — R402210 Coma scale, best verbal response, none, unspecified time: Secondary | ICD-10-CM | POA: Diagnosis not present

## 2019-01-08 HISTORY — DX: Personal history of Methicillin resistant Staphylococcus aureus infection: Z86.14

## 2019-01-08 HISTORY — DX: Respiratory failure, unspecified, unspecified whether with hypoxia or hypercapnia (CMS HCC): J96.90

## 2019-01-08 LAB — GOLD TOP TUBE

## 2019-01-08 LAB — CBC WITH DIFF
BASOPHIL #: 0.05 10*3/uL (ref <=0.20)
BASOPHIL #: <0.1 10*3/uL (ref <=0.20)
BASOPHIL %: 1 %
BASOPHIL %: 0 %
BASOPHIL %: 0 %
EOSINOPHIL #: 0.38 10*3/uL (ref <=0.50)
EOSINOPHIL #: 0.36 10*3/uL (ref <=0.50)
EOSINOPHIL %: 4 %
EOSINOPHIL %: 3 %
HCT: 38 % (ref 34.8–46.0)
HCT: 36.3 % (ref 34.8–46.0)
HGB: 12.2 g/dL (ref 11.5–16.0)
HGB: 11.9 g/dL (ref 11.5–16.0)
IMMATURE GRANULOCYTE #: <0.04 10*3/uL (ref <0.10)
IMMATURE GRANULOCYTE #: <0.1 10*3/uL (ref <0.10)
IMMATURE GRANULOCYTE %: 0 % (ref 0–1)
IMMATURE GRANULOCYTE %: 1 % (ref 0–1)
LYMPHOCYTE #: 1.32 10*3/uL (ref 1.00–4.80)
LYMPHOCYTE #: 1.82 10*3/uL (ref 1.00–4.80)
LYMPHOCYTE %: 13 %
LYMPHOCYTE %: 13 %
MCH: 28.9 pg (ref 26.0–32.0)
MCH: 29 pg (ref 26.0–32.0)
MCHC: 32.1 g/dL (ref 31.0–35.5)
MCHC: 32.8 g/dL (ref 31.0–35.5)
MCV: 90 fL (ref 78.0–100.0)
MCV: 88.3 fL (ref 78.0–100.0)
MONOCYTE #: 0.83 10*3/uL (ref 0.20–1.10)
MONOCYTE #: 1.04 10*3/uL (ref 0.20–1.10)
MONOCYTE %: 8 %
MONOCYTE %: 7 %
MPV: 10.9 fL (ref 8.7–12.5)
MPV: 9.7 fL (ref 8.7–12.5)
NEUTROPHIL #: 7.88 10*3/uL — ABNORMAL HIGH (ref 1.50–7.70)
NEUTROPHIL #: 11.06 10*3/uL — ABNORMAL HIGH (ref 1.50–7.70)
NEUTROPHIL %: 74 %
NEUTROPHIL %: 76 %
PLATELETS: 319 10*3/uL (ref 150–400)
PLATELETS: 366 10*3/uL (ref 150–400)
RBC: 4.22 10*6/uL (ref 3.85–5.22)
RBC: 4.11 10*6/uL (ref 3.85–5.22)
RDW-CV: 14.9 % (ref 11.5–15.5)
RDW-CV: 14.8 % (ref 11.5–15.5)
WBC: 10.5 10*3/uL (ref 3.7–11.0)
WBC: 14.4 10*3/uL — ABNORMAL HIGH (ref 3.7–11.0)

## 2019-01-08 LAB — VENOUS BLOOD GAS/LACTATE
%FIO2 (VENOUS): 100 %
BASE EXCESS: 9.9 mmol/L — ABNORMAL HIGH (ref ?–3.0)
BICARBONATE (VENOUS): 32.4 mmol/L — ABNORMAL HIGH (ref 22.0–26.0)
LACTATE: 1.1 mmol/L (ref 0.0–1.3)
PCO2 (VENOUS): 53 mmHg — ABNORMAL HIGH (ref 41.00–51.00)
PH (VENOUS): 7.44 — ABNORMAL HIGH (ref 7.31–7.41)
PO2 (VENOUS): 52 mm/Hg — ABNORMAL HIGH (ref 35.0–50.0)
PO2 (VENOUS): 52 mmHg — ABNORMAL HIGH (ref 35.0–50.0)

## 2019-01-08 LAB — BASIC METABOLIC PANEL
ANION GAP: 11 mmol/L (ref 4–13)
ANION GAP: 11 mmol/L (ref 4–13)
BUN/CREA RATIO: 53 — ABNORMAL HIGH (ref 6–22)
BUN: 27 mg/dL — ABNORMAL HIGH (ref 8–25)
CALCIUM: 10.1 mg/dL (ref 8.5–10.2)
CHLORIDE: 97 mmol/L (ref 96–111)
CO2 TOTAL: 33 mmol/L — ABNORMAL HIGH (ref 22–32)
CREATININE: 0.51 mg/dL (ref 0.49–1.10)
ESTIMATED GFR: >60 mL/min/{1.73_m2} (ref >60)
GLUCOSE: 112 mg/dL (ref 65–139)
GLUCOSE: 112 mg/dL (ref 65–139)
POTASSIUM: 3.1 mmol/L — ABNORMAL LOW (ref 3.5–5.1)
SODIUM: 141 mmol/L (ref 136–145)

## 2019-01-08 LAB — COMPREHENSIVE METABOLIC PANEL, NON-FASTING
ALBUMIN: 3.7 g/dL (ref 3.5–5.0)
ALKALINE PHOSPHATASE: 177 U/L — ABNORMAL HIGH (ref 38–126)
ALT (SGPT): 31 U/L (ref <=35)
ANION GAP: 5 mmol/L — ABNORMAL LOW (ref 10–20)
AST (SGOT): 30 U/L (ref 14–36)
BILIRUBIN TOTAL: 0.2 mg/dL — ABNORMAL LOW (ref 0.3–1.3)
BILIRUBIN TOTAL: 0.2 mg/dL — ABNORMAL LOW (ref 0.3–1.3)
BUN/CREA RATIO: 93
BUN: 28 mg/dL — ABNORMAL HIGH (ref 8–25)
CALCIUM: 10.3 mg/dL (ref 8.5–10.4)
CHLORIDE: 94 mmol/L — ABNORMAL LOW (ref 98–107)
CO2 TOTAL: 38 mmol/L — ABNORMAL HIGH (ref 22–32)
CREATININE: 0.3 mg/dL — ABNORMAL LOW (ref 0.49–1.10)
ESTIMATED GFR: 126 mL/min/{1.73_m2}
GLUCOSE: 115 mg/dL — ABNORMAL HIGH (ref 70–105)
POTASSIUM: 3.3 mmol/L — ABNORMAL LOW (ref 3.5–5.1)
PROTEIN TOTAL: 7.3 g/dL (ref 6.0–8.0)
SODIUM: 137 mmol/L (ref 137–145)

## 2019-01-08 LAB — LACTIC ACID LEVEL W/ REFLEX FOR LEVEL >2.0: LACTIC ACID: 1.2 mmol/L (ref 0.7–2.1)

## 2019-01-08 LAB — PT/INR
INR: 1.06 (ref <=4.50)
PROTHROMBIN TIME: 12.3 s (ref 9.4–12.5)

## 2019-01-08 LAB — MAGNESIUM
MAGNESIUM: 1.9 mg/dL (ref 1.6–2.5)
MAGNESIUM: 1.8 mg/dL (ref 1.6–2.6)

## 2019-01-08 LAB — PHOSPHORUS: PHOSPHORUS: 4.3 mg/dL (ref 2.4–4.7)

## 2019-01-08 LAB — TROPONIN-I (FOR ED ONLY): TROPONIN I: 19 ng/L (ref 0–30)

## 2019-01-08 LAB — PHENYTOIN
PHENYTOIN LEVEL: 25.2 ug/mL — ABNORMAL HIGH (ref 10.0–20.0)
PHENYTOIN LEVEL: 25.2 ug/mL — ABNORMAL HIGH (ref 10.0–20.0)

## 2019-01-08 LAB — TROPONIN-I: TROPONIN I: <=0.01 ng/mL (ref <=0.03)

## 2019-01-08 LAB — THYROID STIMULATING HORMONE (SENSITIVE TSH): TSH: 0.826 u[IU]/mL (ref 0.350–5.000)

## 2019-01-08 LAB — CREATINE KINASE (CK), TOTAL, SERUM OR PLASMA: CREATINE KINASE: <20 U/L — ABNORMAL LOW (ref 30–135)

## 2019-01-08 LAB — PTT (PARTIAL THROMBOPLASTIN TIME): APTT: 39.7 s — ABNORMAL HIGH (ref 25.1–36.5)

## 2019-01-08 MED ORDER — DEXAMETHASONE SODIUM PHOSPHATE (PF) 10 MG/ML INJECTION SOLUTION
10.00 mg | INTRAMUSCULAR | Status: AC
Start: 2019-01-09 — End: 2019-01-08
  Administered 2019-01-08: 10 mg via INTRAVENOUS
  Filled 2019-01-08: qty 1

## 2019-01-08 MED ORDER — SODIUM CHLORIDE 0.9 % INTRAVENOUS SOLUTION
1500.00 mg | INTRAVENOUS | Status: AC
Start: 2019-01-09 — End: 2019-01-09
  Administered 2019-01-08: 1500 mg via INTRAVENOUS
  Administered 2019-01-09: 0 mg via INTRAVENOUS
  Filled 2019-01-08: qty 15

## 2019-01-08 NOTE — ED Nurses Note (Signed)
Neurology service at bedside

## 2019-01-08 NOTE — ED Attending Note (Signed)
I was physically present and directly supervised this patients care. Patient seen and examined with the resident, Dr. Tamala Julian, and history and exam reviewed. Key elements in addition to and/or correction of that documentation are as follows:      Patient is a 58 y.o.  female presenting to the ED with chief complaint of Seizure and altered mental status.  Patient was transferred from outside facility due to concerns for possible status epilepticus have a setting of recent history of meningioma status post resection.  Patient is trach and peg dependent.  CT outside displayed worsening intracranial pressure.  Patient has Given flap in place to alleviate pressure.  She was noted to have intermittent hypoxic episodes prior to arrival.  Unsure if this is a related to seizure-like activity.  She has also been noted to be biting her tongue.  Per family she has full code.    For further details surrounding HPI please refer to Primary Note       ROS: Otherwise negative, if commented on in the HPI.   Filed Vitals:    01/08/19 2256   BP: (!) 149/101   Pulse: 93   Resp: (!) 24   Temp: 36.4 C (97.5 F)   SpO2: 100%       PMH, PSH, medications, allergies, SH, and FH per resident note. Important aspects of these fields pertaining to today's visit taken into consideration during history/physical and MDM.    Physical Exam:     Please see primary note for physical exam findings. Additional findings of my own are documented below.         MDM:   During the patient's stay in the emergency department, images and/or labs were performed to assist with medical decision making and were reviewed by myself.  Patient seen and examined with the assistance of Dr. Tamala Julian. Agree with above. I have reviewed and agree with Dr. Thompson Caul MDM/course.     On arrival patient is vitally stable.  Called family to discuss code status.  Discussed code status with MPOA.  MPOA states that patient is full code at this time.  She plans on discussing this further  with her family after admission.  Patient was given Ativan on arrival.  Had improvement symptoms.  Required repeat dose of Ativan 1 patient was found to be biting her tongue.  Changed uncuffed tube cuffed tube in case patient need to be ventilated secondary to anti epileptic medications disc pressing respiratory drive.  She was also loaded with 3 g of Keppra.  Neurosurgery at neurology consulted.  Given multiple medical problems the are asking for admission to the MICU.    Impression:   Encounter Diagnoses   Name Primary?   . Vegetative state (CMS [place]) Yes   . Seizure (CMS [place])    . Pneumonia    . 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 Chronic respiratory failure (CMS [facility])      Disposition: Admitted  Critical care time:  Patient presented with/developed status epilepticus. I spent 63 minutes while the patient was in this condition providing airway management, repeat at administrations of antiepileptics such as Ativan and Keppra, and frequent reassessment. My care also included initial evaluation and stabilization, review of data, re-examination, discussion with admitting and consulting services to arrange definitive care, and was exclusive of any procedures performed. In addition, I reviewed the resident's documentation and agree with the assessment and  plan of care.      Chart completed after conclusion of patient care due to time constraints of direct patient care during shift.  Chart was dictated using voice recognition software, which may lead to minor grammatical or syntax errors.

## 2019-01-08 NOTE — ED Provider Notes (Signed)
Emergency Department           Encounter Diagnoses   Name Primary?   . Vegetative state (CMS [place]) Yes   . Seizure (CMS [place])    . 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  Concerns for seizure.  Patient had concerns for status and was given Ativan in the ED.  Which appeared to abate symptoms.  Patient was on a trach that was not appropriate for ventilation therefore trach tube was changed to a cuffed tube.  Upon changing the cuff purulent material exuded from the trach tube.  Patient was started on broad-spectrum antibiotics.  Due to concerns for aspiration pneumonia.  Neurosurgery consulted due to concerning CT findings determined no acute intervention.  Neurology consulted due to concerns for seizure and patient was loaded with 3 g of Keppra.  Agreed to following patient.  MICU was called for admission due to ventilator status and most likely will need been changes only next 24 hours.  Patient remained stable throughout the course was eventually admitted to MICU for further evaluation management.        Consults: NSGY, Neuro, MICU      Disposition: Admitted              Current Discharge Medication List      CONTINUE these medications - NO CHANGES were made during your visit.      Details   acetaminophen 325 mg Tablet  Commonly known as:  TYLENOL   650 mg, Oral, EVERY 6 HOURS PRN  Refills:  0     amLODIPine 10 mg Tablet  Commonly known as:  NORVASC   10 mg, Oral, DAILY, G tube   Refills:  0     apixaban 2.5 mg Tablet  Commonly known as:  ELIQUIS   2.5 mg, Oral, 2 TIMES DAILY, G tube   Refills:  0     ascorbic acid (vitamin C) 500 mg Tablet  Commonly known as:  VITAMIN C   500 mg, Gastric (NG, OG, PEG, GT), 2 TIMES DAILY  Refills:  0     aspirin 325 mg Tablet   325 mg, Gastric (NG, OG, PEG, GT), DAILY  Refills:  0     cloBAZam 2.5 mg/mL Suspension  Commonly known as:  ONFI   20 mg, Gastric (NG, OG,  PEG, GT), EVERY 8 HOURS (SCHEDULED)  Refills:  0     * Diastat AcuDiaL 5-7.5-10 mg Kit  Generic drug:  diazePAM   10 mg, Rectal, EVERY 6 HOURS PRN  Refills:  0     * diazePAM 5 mg Tablet  Commonly known as:  VALIUM   5 mg, Oral, 2 TIMES DAILY, G tube   Refills:  0     fluconazole 150 mg Tablet  Commonly known as:  DIFLUCAN   150 mg, Oral, EVERY 7 DAYS, On Friday by g tube   Refills:  0     IPRATROPIUM-ALBUTEROL INHL   Inhalation, Every 4 hrs as needed for sob   Refills:  0     JUVEN ORAL   Oral, DAILY  Refills:  0     lacosamide 200 mg Tablet  Commonly known as:  VIMPAT   200 mg, Gastric (NG, OG, PEG, GT), 2 TIMES DAILY  Refills:  0     levETIRAcetam 750 mg Tablet  Commonly known as:  KEPPRA  1,500 mg, Gastric (NG, OG, PEG, GT), 2 TIMES DAILY  Refills:  0     lisinopriL 40 mg Tablet  Commonly known as:  PRINIVIL   40 mg, Oral, DAILY  Refills:  0     metoprolol tartrate 25 mg Tablet  Commonly known as:  LOPRESSOR   100 mg, Oral, 2 TIMES DAILY  Refills:  0     Peridex 0.12 % Mouthwash  Generic drug:  chlorhexidine gluconate   15 mL, Swish & Spit, 2 TIMES DAILY  Refills:  0     phenytoin 100 mg/4 mL Suspension  Commonly known as:  DILANTIN   100 mg, Gastric (NG, OG, PEG, GT), EVERY 8 HOURS (SCHEDULED)  Refills:  0         * This list has 2 medication(s) that are the same as other medications prescribed for you. Read the directions carefully, and ask your doctor or other care provider to review them with you.                  Chief Complaint:  Patient presents with     Chief Complaint   Patient presents with   . Seizure Prior Hx Of     biting tongue earlier. concern for subclinical seizure         HPI    Lynnelle Mesmer, date of birth [birth year], is a 58 y.o. female who presents to the Emergency Department for concerns over seizure and altered mental status.  Pain is on multiple antiepileptic drugs in the chart.  Patient was transfer from outside hospital due to concerns over altered mental status and potentially  status epilepticus.  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  CT from the outside hospital displayed worsening intracranial pressure.  Patient skin flap in place to alleviate pressure.  Patient was intermittently hypoxic prior to arrival.      Review of Systems   Unable to perform ROS: Patient nonverbal   Constitutional: Negative for chills, fatigue and fever.   HENT: Negative for congestion, rhinorrhea and sore throat.    Eyes: Negative for pain and redness.   Respiratory: Negative for cough and wheezing.    Cardiovascular: Negative for chest pain and leg swelling.   Gastrointestinal: Negative for abdominal pain, constipation, diarrhea, nausea and vomiting.   Genitourinary: Negative for dysuria and flank pain.   Musculoskeletal: Negative for back pain.   Skin: Negative for rash.   Neurological: Negative for weakness, light-headedness, numbness and headaches.            Physical Exam   Constitutional: She is oriented to person, place, and time. No distress.   HENT:   Head: Normocephalic and atraumatic.   Clenching of the jaw.  Laceration to the tongue secondary to jaw clenching concerning for status epilepticus.   Eyes: Pupils are equal, round, and reactive to light. Conjunctivae are normal. No scleral icterus.   Neck: Normal range of motion. Neck supple.   Cardiovascular: Normal rate, regular rhythm, normal heart sounds and intact distal pulses.   Pulmonary/Chest: Effort normal. No respiratory distress.   Trach dependent.  Purulent material coming from trach tube.   Abdominal: Soft. She exhibits no distension.   Musculoskeletal: Normal range of motion.         General: No deformity.   Neurological: She is alert and oriented to person, place, and time.   Skin: Skin is warm and dry.  Psychiatric: She has a normal mood and affect.       Vitals:  Filed Vitals:    01/10/19 2216 01/10/19 2337 01/11/19 0351 01/11/19 0707   BP:  112/61  123/71   Pulse:   91  90   Resp:    16   Temp:  36 C (96.8 F) 36 C (96.8 F) 36.2 C (97.2 F)   SpO2: 100% 100%         Past Medical History:  Diagnosis     Past Medical History:   Diagnosis Date   . Anxiety    . Arthropathy    . Cancer (CMS [facility])     brain tumor   . CAP (community acquired pneumonia) 01/09/2019   . Dyspnea on exertion    . Esophageal reflux     does not take meds   . Heart murmur     benign   . HTN (hypertension)    . Hx MRSA infection    . Hyperlipemia    . Muscle weakness     right sided weakness   . Palpitations    . Respiratory failure (CMS [facility])    . Seizure (CMS [place]) 01/09/2019   . Shortness of breath    . Wears glasses     reading       Past Surgical History:  Past Surgical History:   Procedure Laterality Date   . Hx appendectomy     . Hx breast augmentation Bilateral    . Hx lap cholecystectomy     . Hx lumbar diskectomy  1999   . Hx other Right    . Hx other     . Hx tubal ligation     . Tracheostomy         Family History:   Family History   Problem Relation Age of Onset   . Heart Attack Mother    . Lymphoma Mother    . Ehlers-Danlos syndrome Daughter        Social History     Social History     Tobacco Use   . Smoking status: Current Every Day Smoker     Packs/day: 1.00     Years: 30.00     Pack years: 30.00     Types: Cigarettes   . Smokeless tobacco: Never Used   . Tobacco comment: down to 8 cigarettes, counseled on 1-800-QUIT_NOW   Substance Use Topics   . Alcohol use: Never     Frequency: Never   . Drug use: Never         Social History Main Topics     Social History     Substance and Sexual Activity   Drug Use Never           No Known Allergies        Vital Signs:  Pre-disposition vitals  Filed Vitals:    01/10/19 2216 01/10/19 2337 01/11/19 0351 01/11/19 0707   BP:  112/61  123/71   Pulse:  91  90   Resp:    16   Temp:  36 C (96.8 F) 36 C (96.8 F) 36.2 C (97.2 F)   SpO2: 100% 100%         Old records reviewed by me:  Meds/labs/notes       Diagnostics:    Labs:    Results for orders placed or  performed during the hospital encounter of 01/08/19   ADULT ROUTINE BLOOD CULTURE, SET OF 2 BOTTLES (BACTERIA  AND YEAST)   Result Value Ref Range    BLOOD CULTURE, ROUTINE Abnormal Stain (AA)     BLOOD CULTURE, ROUTINE (A)      Staphylococcus epidermidis, Coagulase negative Staphylococcus    GRAM STAIN Gram Positive Cocci/Clusters (A)     GRAM STAIN Gram Positive Cocci/Clusters (A)    ADULT ROUTINE BLOOD CULTURE, SET OF 2 BOTTLES (BACTERIA AND YEAST)   Result Value Ref Range    BLOOD CULTURE, ROUTINE No Growth 2 Days    URINE CULTURE   Result Value Ref Range    URINE CULTURE >100000 Enterococcus faecalis (A)        Susceptibility    Enterococcus faecalis - MIC SUSCEPTIBILITY     Ampicillin <=2 Sensitive mcg/mL     Gentamicin High Level Synergy Resistant Resistant mcg/mL     Daptomycin 4 Intermediate mcg/mL     Ciprofloxacin >=8 Resistant mcg/mL     Levofloxacin >=8 Resistant mcg/mL     Linezolid 2 Sensitive mcg/mL     Vancomycin 1 Sensitive mcg/mL     Tigecycline <=0.12 Sensitive mcg/mL     Nitrofurantoin <=16 Sensitive mcg/mL   MRSA SCREEN, PCR, RAPID   Result Value Ref Range    MRSA COLONIZATION SCREEN Positive (A) Negative   STREPTOCOCCUS PNEUMONIAE ANTIGEN,URINE   Result Value Ref Range    S.PNEUMONIA ANTIGEN Negative Negative, Indeterminate   LEGIONELLA URINE ANTIGEN   Result Value Ref Range    LEGIONELLA ANTIGEN Negative Negative, Indeterminate   BASIC METABOLIC PANEL   Result Value Ref Range    SODIUM 141 136 - 145 mmol/L    POTASSIUM 3.1 (L) 3.5 - 5.1 mmol/L    CHLORIDE 97 96 - 111 mmol/L    CO2 TOTAL 33 (H) 22 - 32 mmol/L    ANION GAP 11 4 - 13 mmol/L    CALCIUM 10.1 8.5 - 10.2 mg/dL    GLUCOSE 112 65 - 139 mg/dL    BUN 27 (H) 8 - 25 mg/dL    CREATININE 0.51 0.49 - 1.10 mg/dL    BUN/CREA RATIO 53 (H) 6 - 22    ESTIMATED GFR >60 >60 mL/min/1.46m2   VENOUS BLOOD GAS/LACTATE   Result Value Ref Range    %FIO2 (VENOUS) 100.0 %    PH (VENOUS) 7.44 (H) 7.31 - 7.41    PCO2 (VENOUS) 53.00 (H) 41.00 - 51.00 mm/Hg     PO2 (VENOUS) 52.0 (H) 35.0 - 50.0 mm/Hg    BASE EXCESS 9.9 (H) -3.0 - 3.0 mmol/L    BICARBONATE (VENOUS) 32.4 (H) 22.0 - 26.0 mmol/L    LACTATE 1.1 0.0 - 1.3 mmol/L   PHOSPHORUS   Result Value Ref Range    PHOSPHORUS 4.3 2.4 - 4.7 mg/dL   MAGNESIUM   Result Value Ref Range    MAGNESIUM 1.8 1.6 - 2.6 mg/dL   TROPONIN-I (FOR ED ONLY)   Result Value Ref Range    TROPONIN I 19 0 - 30 ng/L   LEVETIRACETAM, SERUM   Result Value Ref Range    LEVETIRACETAM,SERUM 10.2 (L) 12.0 - 46.0 mcg/mL   PHENYTOIN (Dilantin) LEVEL   Result Value Ref Range    PHENYTOIN LEVEL 25.2 (H) 10.0 - 20.0 ug/mL   CBC WITH DIFF   Result Value Ref Range    WBC 14.4 (H) 3.7 - 11.0 x10^3/uL    RBC 4.11 3.85 - 5.22 x10^6/uL    HGB 11.9 11.5 - 16.0 g/dL    HCT 36.3 34.8 - 46.0 %  MCV 88.3 78.0 - 100.0 fL    MCH 29.0 26.0 - 32.0 pg    MCHC 32.8 31.0 - 35.5 g/dL    RDW-CV 14.8 11.5 - 15.5 %    PLATELETS 366 150 - 400 x10^3/uL    MPV 9.7 8.7 - 12.5 fL    NEUTROPHIL % 76 %    LYMPHOCYTE % 13 %    MONOCYTE % 7 %    EOSINOPHIL % 3 %    BASOPHIL % 0 %    NEUTROPHIL # 11.06 (H) 1.50 - 7.70 x10^3/uL    LYMPHOCYTE # 1.82 1.00 - 4.80 x10^3/uL    MONOCYTE # 1.04 0.20 - 1.10 x10^3/uL    EOSINOPHIL # 0.36 <=0.50 x10^3/uL    BASOPHIL # <0.10 <=0.20 x10^3/uL    IMMATURE GRANULOCYTE % 1 0 - 1 %    IMMATURE GRANULOCYTE # <0.10 <0.10 x10^3/uL   URINALYSIS, MACROSCOPIC   Result Value Ref Range    SPECIFIC GRAVITY 1.018 1.005 - 1.030    GLUCOSE Negative Negative mg/dL    PROTEIN Negative Negative mg/dL    BILIRUBIN Negative Negative mg/dL    UROBILINOGEN Negative Negative mg/dL    PH 5.0 5.0 - 8.0    BLOOD Negative Negative mg/dL    KETONES Negative Negative mg/dL    NITRITE Negative Negative    LEUKOCYTES Small (A) Negative WBCs/uL    APPEARANCE Cloudy (A) Clear    COLOR Normal (Yellow) Normal (Yellow)   URINALYSIS, MICROSCOPIC   Result Value Ref Range    WBCS 8.0 <11.0 /hpf    RBCS 0.0 <6.0 /hpf    BACTERIA Several (A) Occasional or less /hpf    HYALINE CASTS 9.0 (H)  <4.0 /lpf    SQUAMOUS EPITHELIAL CELLS Occasional or less Occasional or less /lpf    MUCOUS Light Light /lpf   COVID-19 SCREENING - Admitted or Likely to be Admitted   Result Value Ref Range    SARS-CoV-2 Not Detected Not Detected   PROCALCITONIN   Result Value Ref Range    PROCALCITONIN  <0.05 <0.50 ng/mL   BASIC METABOLIC PANEL   Result Value Ref Range    SODIUM 140 136 - 145 mmol/L    POTASSIUM 4.3 3.5 - 5.1 mmol/L    CHLORIDE 99 96 - 111 mmol/L    CO2 TOTAL 33 (H) 22 - 32 mmol/L    ANION GAP 8 4 - 13 mmol/L    CALCIUM 8.8 8.5 - 10.2 mg/dL    GLUCOSE 146 (H) 65 - 139 mg/dL    BUN 25 8 - 25 mg/dL    CREATININE 0.54 0.49 - 1.10 mg/dL    BUN/CREA RATIO 46 (H) 6 - 22    ESTIMATED GFR >60 >60 mL/min/1.23m2   MAGNESIUM   Result Value Ref Range    MAGNESIUM 1.8 1.6 - 2.6 mg/dL   PHOSPHORUS   Result Value Ref Range    PHOSPHORUS 3.9 2.4 - 4.7 mg/dL   CBC WITH DIFF   Result Value Ref Range    WBC 9.4 3.7 - 11.0 x10^3/uL    RBC 3.38 (L) 3.85 - 5.22 x10^6/uL    HGB 9.6 (L) 11.5 - 16.0 g/dL    HCT 29.9 (L) 34.8 - 46.0 %    MCV 88.5 78.0 - 100.0 fL    MCH 28.4 26.0 - 32.0 pg    MCHC 32.1 31.0 - 35.5 g/dL    RDW-CV 15.0 11.5 - 15.5 %    PLATELETS 301 150 -  400 x10^3/uL    MPV 10.0 8.7 - 12.5 fL    NEUTROPHIL % 86 %    LYMPHOCYTE % 10 %    MONOCYTE % 3 %    EOSINOPHIL % 0 %    BASOPHIL % 0 %    NEUTROPHIL # 8.05 (H) 1.50 - 7.70 x10^3/uL    LYMPHOCYTE # 0.97 (L) 1.00 - 4.80 x10^3/uL    MONOCYTE # 0.29 0.20 - 1.10 x10^3/uL    EOSINOPHIL # <0.10 <=0.50 x10^3/uL    BASOPHIL # <0.10 <=0.20 x10^3/uL    IMMATURE GRANULOCYTE % 1 0 - 1 %    IMMATURE GRANULOCYTE # <0.10 <0.10 x10^3/uL   AMMONIA   Result Value Ref Range    AMMONIA 38 15 - 50 umol/L   VENOUS BLOOD GAS/LACTATE   Result Value Ref Range    %FIO2 (VENOUS) 30.0 %    PH (VENOUS) 7.44 (H) 7.31 - 7.41    PCO2 (VENOUS) 49.00 41.00 - 51.00 mm/Hg    PO2 (VENOUS) 61.0 (H) 35.0 - 50.0 mm/Hg    BASE EXCESS 7.8 (H) -3.0 - 3.0 mmol/L    BICARBONATE (VENOUS) 30.9 (H) 22.0 - 26.0 mmol/L     LACTATE 1.1 0.0 - 1.3 mmol/L   HGA1C (HEMOGLOBIN A1C WITH EST AVG GLUCOSE)   Result Value Ref Range    HEMOGLOBIN A1C 5.5 4.0 - 5.6 %    ESTIMATED AVERAGE GLUCOSE 111 mg/dL   BASIC METABOLIC PANEL   Result Value Ref Range    SODIUM 140 136 - 145 mmol/L    POTASSIUM 3.0 (L) 3.5 - 5.1 mmol/L    CHLORIDE 102 96 - 111 mmol/L    CO2 TOTAL 28 22 - 32 mmol/L    ANION GAP 10 4 - 13 mmol/L    CALCIUM 9.1 8.5 - 10.2 mg/dL    GLUCOSE 143 (H) 65 - 139 mg/dL    BUN 22 8 - 25 mg/dL    CREATININE 0.63 0.49 - 1.10 mg/dL    BUN/CREA RATIO 35 (H) 6 - 22    ESTIMATED GFR >60 >60 mL/min/1.47m2   LACTIC ACID LEVEL   Result Value Ref Range    LACTIC ACID 3.2 (H) 0.5 - 2.2 mmol/L   LACTIC ACID LEVEL   Result Value Ref Range    LACTIC ACID 1.1 0.5 - 2.2 mmol/L   BASIC METABOLIC PANEL   Result Value Ref Range    SODIUM 143 136 - 145 mmol/L    POTASSIUM 2.7 (LL) 3.5 - 5.1 mmol/L    CHLORIDE 102 96 - 111 mmol/L    CO2 TOTAL 32 22 - 32 mmol/L    ANION GAP 9 4 - 13 mmol/L    CALCIUM 9.0 8.5 - 10.2 mg/dL    GLUCOSE 101 65 - 139 mg/dL    BUN 20 8 - 25 mg/dL    CREATININE 0.52 0.49 - 1.10 mg/dL    BUN/CREA RATIO 38 (H) 6 - 22    ESTIMATED GFR >60 >60 mL/min/1.750m   MAGNESIUM   Result Value Ref Range    MAGNESIUM 2.0 1.6 - 2.6 mg/dL   PHOSPHORUS   Result Value Ref Range    PHOSPHORUS 3.3 2.4 - 4.7 mg/dL   LACTIC ACID LEVEL   Result Value Ref Range    LACTIC ACID 0.9 0.5 - 2.2 mmol/L   CBC WITH DIFF   Result Value Ref Range    WBC 6.4 3.7 - 11.0 x10^3/uL    RBC 3.07 (L) 3.85 -  5.22 x10^6/uL    HGB 8.8 (L) 11.5 - 16.0 g/dL    HCT 27.8 (L) 34.8 - 46.0 %    MCV 90.6 78.0 - 100.0 fL    MCH 28.7 26.0 - 32.0 pg    MCHC 31.7 31.0 - 35.5 g/dL    RDW-CV 15.3 11.5 - 15.5 %    PLATELETS 222 150 - 400 x10^3/uL    MPV 10.6 8.7 - 12.5 fL    NEUTROPHIL % 58 %    LYMPHOCYTE % 27 %    MONOCYTE % 10 %    EOSINOPHIL % 3 %    BASOPHIL % 1 %    NEUTROPHIL # 3.85 1.50 - 7.70 x10^3/uL    LYMPHOCYTE # 1.72 1.00 - 4.80 x10^3/uL    MONOCYTE # 0.61 0.20 - 1.10 x10^3/uL     EOSINOPHIL # 0.19 <=0.50 x10^3/uL    BASOPHIL # <0.10 <=0.20 x10^3/uL    IMMATURE GRANULOCYTE % 1 0 - 1 %    IMMATURE GRANULOCYTE # <0.10 <0.10 x10^3/uL   LACTIC ACID LEVEL   Result Value Ref Range    LACTIC ACID 0.6 0.5 - 2.2 mmol/L   POTASSIUM   Result Value Ref Range    POTASSIUM 3.5 3.5 - 5.1 mmol/L   PHENYTOIN   Result Value Ref Range    PHENYTOIN LEVEL 15.4 10.0 - 20.0 ug/mL   PHENYTOIN, FREE   Result Value Ref Range    FREE PHENYTOIN LEVEL 1.6 1.0 - 2.0 ug/mL   BASIC METABOLIC PANEL   Result Value Ref Range    SODIUM 143 136 - 145 mmol/L    POTASSIUM 3.0 (L) 3.5 - 5.1 mmol/L    CHLORIDE 106 96 - 111 mmol/L    CO2 TOTAL 29 22 - 32 mmol/L    ANION GAP 8 4 - 13 mmol/L    CALCIUM 8.6 8.5 - 10.2 mg/dL    GLUCOSE 107 65 - 139 mg/dL    BUN 14 8 - 25 mg/dL    CREATININE 0.45 (L) 0.49 - 1.10 mg/dL    BUN/CREA RATIO 31 (H) 6 - 22    ESTIMATED GFR >60 >60 mL/min/1.99m2   PHOSPHORUS   Result Value Ref Range    PHOSPHORUS 3.0 2.4 - 4.7 mg/dL   MAGNESIUM   Result Value Ref Range    MAGNESIUM 1.8 1.6 - 2.6 mg/dL   CBC WITH DIFF   Result Value Ref Range    WBC 6.0 3.7 - 11.0 x10^3/uL    RBC 3.02 (L) 3.85 - 5.22 x10^6/uL    HGB 8.6 (L) 11.5 - 16.0 g/dL    HCT 27.2 (L) 34.8 - 46.0 %    MCV 90.1 78.0 - 100.0 fL    MCH 28.5 26.0 - 32.0 pg    MCHC 31.6 31.0 - 35.5 g/dL    RDW-CV 15.1 11.5 - 15.5 %    PLATELETS 242 150 - 400 x10^3/uL    MPV 9.9 8.7 - 12.5 fL    NEUTROPHIL % 65 %    LYMPHOCYTE % 19 %    MONOCYTE % 9 %    EOSINOPHIL % 5 %    BASOPHIL % 1 %    NEUTROPHIL # 3.89 1.50 - 7.70 x10^3/uL    LYMPHOCYTE # 1.14 1.00 - 4.80 x10^3/uL    MONOCYTE # 0.52 0.20 - 1.10 x10^3/uL    EOSINOPHIL # 0.30 <=0.50 x10^3/uL    BASOPHIL # <0.10 <=0.20 x10^3/uL    IMMATURE GRANULOCYTE % 1 0 -  1 %    IMMATURE GRANULOCYTE # <0.10 <0.10 x10^3/uL   ECG 12-LEAD   Result Value Ref Range    Ventricular rate 96 BPM    Atrial Rate 96 BPM    PR Interval 136 ms    QRS Duration 104 ms    QT Interval 388 ms    QTC Calculation 490 ms    Calculated P Axis 23  degrees    Calculated R Axis -23 degrees    Calculated T Axis 50 degrees   TYPE AND SCREEN   Result Value Ref Range    UNITS ORDERED NOT STATED         ABO/RH(D) O POSITIVE     ANTIBODY SCREEN NEGATIVE     SPECIMEN EXPIRATION DATE 01/12/2019      Labs reviewed and interpreted by me.    Radiology:    Results for orders placed or performed during the hospital encounter of 01/08/19   XR AP MOBILE CHEST     Status: None    Narrative    [facility]  Female, 58 years old.    XR AP MOBILE CHEST performed on 01/08/2019 11:16 PM.    REASON FOR EXAM:  dyspnea, hypoxia    TECHNIQUE: 1 views/1 images submitted for interpretation.    COMPARISON: Plain radiograph performed January 08, 2019    FINDINGS: Cardiac silhouette is stable in size compared to prior exam.  There appears to be retrocardiac opacity with air bronchograms. Left  basilar atelectasis is demonstrated. Linear atelectasis is again  demonstrated on the right. No significant pleural effusion or pneumothorax  is identified.    Tracheostomy tube is demonstrated with tip projecting approximately 5 cm  above the carina.  Left PICC is demonstrated with tip projecting over the expected location of  mid SVC.      Impression    Suspect retrocardiac atelectasis/consolidation.     CT LAB BRAIN WO IV CONTRAST     Status: None    Narrative    *Procedure not read by radiology.  *Refer to procedure note for result.   MRI BRAIN W/WO CONTRAST     Status: None    Narrative    [facility]  Female, 58 years old.    MRI BRAIN W/WO CONTRAST performed on 01/10/2019 1:00 AM.    REASON FOR EXAM:  seizures post op      INTRAVENOUS CONTRAST: 7.5 ml's of Gadavist  CREATININE/GFR: GFR>60    TECHNIQUE: MRI of the brain performed with and without intravenous  contrast.    COMPARISON: CT performed on January 08, 2019.    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 There is again tissue loss noted to involve the left  thalamus cerebral  peduncle and left midbrain there is a similar degree of  herniation of the left hemisphere especially the temporal lobe through the  left craniectomy defect. There is unchanged dilation of the temporal horn  of the left lateral ventricle with milder dilation involving the occipital  horn. There is unchanged mild prominence of the body and frontal horn of  the left lateral ventricle and of the right lateral ventricle. There is T2  hyperintensity around the temporal horn of the left lateral ventricle in  the anterior temporal lobe white matter corresponding to hypodensity seen  on the prior study and likely represents some transabdominal CSF flow and  possibly a component of gliosis. Minimal extra-axial fluid is noted over  the left temporal lobe anteriorly.  Diffusion sequences are negative for acute infarct. There is some blood  breakdown product tissue staining around the tissue loss involving the left  thalamus, cerebral peduncle and midbrain is blood breakdown product tissue  staining is also seen in the left cerebellar hemisphere. There are moderate  to large bilateral mastoid effusions. Postcontrast sequences demonstrate no  evidence of residual meningioma. Mild dural thickening along the left  tentorium at the resection site. No abnormal parenchymal enhancement is  otherwise identified.      Impression    1.   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  2.  Unchanged dilation of the left lateral ventricle especially the  temporal horn. There is unchanged herniation of the left hemisphere  especially the temporal lobe through the craniectomy defect. These findings  are however worse than the prior CT of August 04, 2018.         EKG:  See Trace Master      Procedures:  none    Orders:  Orders Placed This Encounter   . ADULT ROUTINE BLOOD CULTURE, SET OF 2 BOTTLES (BACTERIA AND YEAST)   . ADULT ROUTINE BLOOD CULTURE, SET OF 2 BOTTLES (BACTERIA AND YEAST)   . URINE CULTURE   . BAL  (QUANT CULTURE/GRAM STAIN)   . MRSA SCREEN, PCR, RAPID   . STREPTOCOCCUS PNEUMONIAE ANTIGEN,URINE   . LEGIONELLA URINE ANTIGEN   . ADULT ROUTINE BLOOD CULTURE, SET OF 2 BOTTLES (BACTERIA AND YEAST)   . ADULT ROUTINE BLOOD CULTURE, SET OF 2 BOTTLES (BACTERIA AND YEAST)   . CATHETER TIP CULTURE   . XR AP MOBILE CHEST   . MRI BRAIN W/WO CONTRAST   . CT LAB BRAIN WO IV CONTRAST   . BASIC METABOLIC PANEL   . CBC/DIFF   . VENOUS BLOOD GAS/LACTATE   . PHOSPHORUS   . MAGNESIUM   . TROPONIN-I (FOR ED ONLY)   . URINALYSIS, MACROSCOPIC AND MICROSCOPIC W/CULTURE REFLEX   . LEVETIRACETAM, SERUM   . LACOSAMIDE (VIMPAT)   . PHENYTOIN (Dilantin) LEVEL   . CBC WITH DIFF   . URINALYSIS, MACROSCOPIC   . URINALYSIS, MICROSCOPIC   . COVID-19 SCREENING - Admitted or Likely to be Admitted   . CELL COUNT AND DIFFERENTIAL-BAL   . BAL FLUID COUNT   . BAL FLUID MANUAL DIFFERENTIAL   . PROCALCITONIN   . BASIC METABOLIC PANEL   . CBC/DIFF   . MAGNESIUM   . PHOSPHORUS   . CBC WITH DIFF   . AMMONIA   . CANCELED: POTASSIUM   . VENOUS BLOOD GAS/LACTATE   . CBC/DIFF   . BASIC METABOLIC PANEL   . MAGNESIUM   . PHOSPHORUS   . CANCELED: LACTIC ACID LEVEL   . HGA1C (HEMOGLOBIN A1C WITH EST AVG GLUCOSE)   . BASIC METABOLIC PANEL   . LACTIC ACID LEVEL   . CBC WITH DIFF   . POTASSIUM   . PHENYTOIN   . PHENYTOIN, FREE   . BASIC METABOLIC PANEL   . PHOSPHORUS   . MAGNESIUM   . CBC/DIFF   . CBC WITH DIFF   . OT EVAL & TREAT (INPATIENT ONLY)   . PT EVALUATE AND TREAT (INPATIENT ONLY)   . CANCELED: OXYGEN - AEROSOL TRACH COLLAR   . CANCELED: VENTILATOR - SIMV(PRVC)PS / APV-SIMV   . CANCELED: END TIDAL CO2   . CANCELED: VENTILATOR - SIMV(PRVC)PS / APV-SIMV   . INCENTIVE SPIROMETRY - RT INSTRUCT -  AGGRESSIVE PULMONARY TOILET   .  CANCELED: PULMONARY EVALUATION - AGGRESSIVE PULMONARY TOILET   . CANCELED: VENTILATOR - CPAP(PS) / SPONTANEOUS   . OXYGEN - AEROSOL TRACH COLLAR   . PULMONARY EVALUATION - AGGRESSIVE PULMONARY TOILET   . ECG 12-LEAD   . CANCELED: TYPE  AND SCREEN   . TYPE AND SCREEN   . EEG - CONTINUOUS VIDEO   . INSERT & MAINTAIN PERIPHERAL IV ACCESS   . REMOVE CENTRAL VENOUS ACCESS - PICC/MIDELINE/CENTRAL LINE (VASCULAR ACCESS TEAM)   . PATIENT CLASS/LEVEL OF CARE DESIGNATION   . FOLLOW-UP: [facility] - [place], [place]   . PLEASE KEEP FOLLOW-UP APPT ALREADY SCHEDULED IN NEUROSURGERY- POC   . levETIRAcetam (KEPPRA) 1,500 mg in NS 100 mL IVPB   . dexamethasone (PF) 10 mg/mL injection   . LORazepam (ATIVAN) 2 mg/mL injection   . LORazepam (ATIVAN) 2 mg/mL injection ---Cabinet Override   . LORazepam (ATIVAN) 2 mg/mL injection   . levETIRAcetam (KEPPRA) 1,500 mg in NS 100 mL IVPB   . vancomycin (VANCOCIN) 1,500 mg in NS 500 mL IVPB   . piperacillin-tazobactam (ZOSYN) 4.5 g in NS 100 mL IVPB   . LORazepam (ATIVAN) 2 mg/mL injection   . AND Linked Order Group    . NS flush syringe    . NS flush syringe   . Vancomycin IV - Pharmacist to Dose per Protocol   . vancomycin (VANCOCIN) 1,250 mg in NS 250 mL IVPB   . electrolyte-A (PLASMALYTE-A) bolus infusion 1,000 mL   . levETIRAcetam (KEPPRA) tablet   . lacosamide (VIMPAT) tablet   . phenytoin (DILANTIN) chewable tablet   . ipratropium-albuterol 0.5 mg-3 mg(2.5 mg base)/3 mL Solution for Nebulization   . potassium bicarbonate-citric acid (EFFER-K) effervescent tablet   . senna concentrate (SENNA) 587m per 177moral liquid   . polyethylene glycol (MIRALAX) oral packet   . heparin 5,000 unit/mL injection   . famotidine (PEPCID) tablet   . alteplase (ACTIVASE) 1 mg/2 mL for CATHETER OCCLUSION   . electrolyte-A (PLASMALYTE-A) bolus infusion 1,000 mL   . cloBAZam (ONFI) tablet   . albumin human (ALBUMINAR) 5% premix infusion   . nutrition protein supplement 15 g per 30 mL packet   . gadobutrol (GADAVIST) 1 mmol/mL (7.5 mL) injection   . potassium chloride 10 mEq in SW 100 mL premix infusion   . potassium bicarbonate-citric acid (EFFER-K) effervescent tablet   . enoxaparin PF (LOVENOX) 40 mg/0.4 mL SubQ  injection   . LORazepam (ATIVAN) 2 mg/mL injection   . LORazepam (ATIVAN) 2 mg/mL injection ---Cabinet Override   . LORazepam (ATIVAN) 2 mg/mL injection ---Cabinet Override   . potassium chloride 10 mEq in SW 100 mL premix infusion   . magnesium oxide (MAG-OX) tablet       KyMorton PetersMD  01/11/2019, 07:58

## 2019-01-08 NOTE — Incoming ED Transfer Note (Signed)
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 Has trach and PEG.  Today NH staff noted patient had clamped teeth down onto tongue and was not releasing.  Gave valium gel to finally get patient to release. No significant damage to tongue reported. Family and NH nurse contacted Dr. Skeet Simmer of NeuroSurg who recommended ED evaluation and possible transfer.  Patient transported to [facility] ED.  Staff noted patient euyes open,  nonverbal, withdraw to pain, No posturing. CT not showing bleed. Dr. Barnett Abu of Neuro Surg contacted and recommended contact Neurology as patient over 6 months postop with possible seizure.  Connected Dr. Shawn Stall with Dr. Joseph Pierini of Neuro who recommended ED to ED transfer.          Type of Transfer: GENERAL    INCOMING GENERAL PT INFORMATION    Transferring Facility: [facility]     Incoming Transfer ETA: 2220    Mode of Arrival: Ambulance    Reason for transfer: Neurology Evaluation     Arrived at Transferring Facility:Today     Interventions: exam, labs, imaging    Response to Interventions: stable    Anticoagulation/Antiplatet: Yes Aspirin     Pertinent Labs: WCC 10.5, K 3.3, creat 0.3, glucose 115     Reported Vital Signs:     HR 85      BP 135/74    Temp 36.6    O2 Sat 100    RR 23  ATC 10 liters    Weight 84.4 kg     Images Obtained Prior to Transfer: Yes    If Yes, What Images?  CT Head and CXR  Mode of Receiving Images?  IMAGES AVAIL MODE: Epic  Key Findings: Post operative changes on the left which is associated with interval  increase in brain herniation through the craniotomy with secondary  ventricular dilation. No hemorrhage or other acute process.    Comments:  Daughter is Ashley Royalty [phone]. [facility] will review current visitation policy   Recommendations to transferring facility: ED to ED transfer    Anticipated immediate needs on arrival (please provide  rationale):  NEUROLOGY              **[facility] ONLY - Go To Navigator and choose the Type of Activation to print to MEDCOM**    Elsie Stain, RN, 01/08/2019 20:27

## 2019-01-08 NOTE — ED Nurses Note (Signed)
Placed on cardiac monitor, ED providers at bedside.  Pt suctioned by RT and RN upon arrival.   Per EMS, pt has cranial flap, is supposed to wear a helmet, but was left at nursing home.  Pt is non verbal at baseline.  Has PICC to left upper arm.  Labs were obtained from PICC as well as 1 set of blood cultures.  EKG and CXR completed at bedside.

## 2019-01-08 NOTE — ED Attending Handoff Note (Signed)
No Known Allergies    Vitals:    01/08/19 1830 01/08/19 1845 01/08/19 1900 01/08/19 1945   BP: 116/64 (!) 113/50 122/65 126/71   Pulse: 89 88 87 87   Resp: 19 16 (!) 26 (!) 22   Temp:       SpO2:  100%     Weight:       Height:       BMI:               HPI:  In brief, patient is a 58 y.o. femalewho presents to the emergency department due to seizure activity. Hx of left temporal craniectomy for left tentorial meningioma in 06/2018. Patient in a vegetative state since that time. Hx of seizures following the surgery as well. Nursing home resident at this time. Nursing home concerned the patient may have had breakthrough seizures today. Normally receives Keppra, phenytoin and vimpat.     Pertinent Exam Findings:  See prior attending note    Pertinent Imaging/Lab results:  Labs Reviewed   COMPREHENSIVE METABOLIC PANEL, NON-FASTING - Abnormal; Notable for the following components:       Result Value    POTASSIUM 3.3 (*)     CHLORIDE 94 (*)     CO2 TOTAL 38 (*)     ANION GAP 5 (*)     BUN 28 (*)     CREATININE 0.30 (*)     GLUCOSE 115 (*)     ALKALINE PHOSPHATASE 177 (*)     BILIRUBIN TOTAL 0.2 (*)     All other components within normal limits    Narrative:     Estimated Glomerular Filtration Rate (eGFR) calculated using the CKD-EPI (2009) equation, intended for patients 60 years of age and older. If race and/or gender is not documented or "unknown," there will be no eGFR calculation.   CREATINE KINASE (CK), TOTAL, SERUM - Abnormal; Notable for the following components:    CREATINE KINASE <20 (*)     All other components within normal limits   PTT (PARTIAL THROMBOPLASTIN TIME) - Abnormal; Notable for the following components:    APTT 39.7 (*)     All other components within normal limits   CBC WITH DIFF - Abnormal; Notable for the following components:    NEUTROPHIL # 7.88 (*)     All other components within normal limits    Narrative:     Percent NRBC - Not Measured  NRB # AUTO - Not Measured   LACTIC ACID LEVEL W/  REFLEX FOR LEVEL >2.0 - Normal   MAGNESIUM - Normal   PT/INR - Normal   THYROID STIMULATING HORMONE (SENSITIVE TSH) - Normal   TROPONIN-I - Normal    Narrative:     <= .03 ng/mL: Negative/"normal". Repeat testing in 3-6 hours if clinically indicated.                                            Marland Kitchen030-0.3 ng/mL: Suspicious for myocardial injury. Serial measurements and/or clinical correlation required.   Repeat testing in 3-6 hours as clinically indicated     0.3 ng/mL or greater: Consistent with myocardial injury   ADULT ROUTINE BLOOD CULTURE, SET OF 2 BOTTLES (BACTERIA AND YEAST)   ADULT ROUTINE BLOOD CULTURE, SET OF 2 BOTTLES (BACTERIA AND YEAST)   CBC/DIFF    Narrative:     The following orders  were created for panel order CBC/DIFF.  Procedure                               Abnormality         Status                     ---------                               -----------         ------                     CBC WITH ZOXW[[id number]]                Abnormal            Final result                 Please view results for these tests on the individual orders.   URINALYSIS, MACROSCOPIC AND MICROSCOPIC W/CULTURE REFLEX    Narrative:     The following orders were created for panel order URINALYSIS, MACROSCOPIC AND MICROSCOPIC W/CULTURE REFLEX.  Procedure                               Abnormality         Status                     ---------                               -----------         ------                     URINALYSIS, MACROSCOPIC[[id number]]                                                     URINALYSIS, MICROSCOPIC[[id number]]                                                       Please view results for these tests on the individual orders.   URINALYSIS, MACROSCOPIC   URINALYSIS, MICROSCOPIC   EXTRA TUBES - [facility]    Narrative:     The following orders were created for panel order EXTRA TUBES - [place].  Procedure                               Abnormality         Status                     ---------                                -----------         ------  GOLD TOP MLYY[[id number]]                                    In process                   Please view results for these tests on the individual orders.   GOLD TOP TUBE     Results for orders placed or performed during the hospital encounter of 01/08/19 (from the past 24 hour(s))   XR AP MOBILE CHEST     Status: None    Narrative    [facility]    Female, 58 years old.    XR AP MOBILE CHEST performed on 01/08/2019 7:32 PM.    REASON FOR EXAM:  possible seizure, trach dependent, r/o aspiration    TECHNIQUE: 1 views/1 images submitted for interpretation.    COMPARISON:  07/29/2018    FINDINGS:  The cardiomediastinal structures are stable with tracheostomy  again seen in position. No pneumothorax or effusion. Bibasilar atelectasis  is again seen. No edema.      Impression    Bibasilar atelectasis.        Radiologist location ID: [id number]     CT BRAIN WO IV CONTRAST     Status: None    Narrative    [facility]    Female, 58 years old.    CT BRAIN WO IV CONTRAST performed on 01/08/2019 7:33 PM.    REASON FOR EXAM:  meningioma resection Dec 2019, new seizure like movements  RADIATION DOSE:  1471.20  TECHNIQUE: CT images of the head were obtained without contrast and    COMPARISON: 08/04/2018    FINDINGS:  Again demonstrated is left temporal parietal craniectomy defect  with interval increased herniation of primarily the left temporal lobe.  This is associated with ex vacuo dilatation of the lateral ventricles in  particular the temporal horn on the left. No intracranial hemorrhage or  acute infarct is seen. No other evidence for herniation or mass effect is  seen. Sphenoid and ethmoidal air cell opacification is again seen but  diminished when compared to prior.      Impression    Post operative changes on the left which is associated with interval  increase in brain herniation through the craniotomy with secondary  ventricular dilation. No hemorrhage or other  acute process.    The CT exam was performed using one or more the following a dose reduction  techniques: Automated exposure control, adjustment of the mA and/or kV  according to the patient's size, or use of iterative reconstruction  technique.         Radiologist location ID: [id number]           Pending Studies:  None    Consults:  Neurosurgery at [facility]    After a thorough discussion of the patient including presentation, ED course, and review of above information I have assumed care of [facility] from Dr. Shawn Stall at 20:44    Truitt Merle, MD    Course:  Orders Placed This Encounter   . ADULT ROUTINE BLOOD CULTURE, SET OF 2 BOTTLES (BACTERIA AND YEAST)   . ADULT ROUTINE BLOOD CULTURE, SET OF 2 BOTTLES (BACTERIA AND YEAST)   . XR AP MOBILE CHEST   . CT BRAIN WO IV CONTRAST   . CBC/DIFF   . COMPREHENSIVE METABOLIC PANEL, NON-FASTING   . CREATINE KINASE (CK),  TOTAL, SERUM   . LACTIC ACID LEVEL W/ REFLEX FOR LEVEL >2.0   . MAGNESIUM   . PT/INR   . PTT (PARTIAL THROMBOPLASTIN TIME)   . THYROID STIMULATING HORMONE (SENSITIVE TSH)   . TROPONIN-I   . URINALYSIS, MACROSCOPIC AND MICROSCOPIC W/CULTURE REFLEX   . CBC WITH DIFF   . URINALYSIS, MACROSCOPIC   . URINALYSIS, MICROSCOPIC   . EXTRA TUBES - [facility]   . GOLD TOP TUBE     Patient vitally stable under my care in the emergency department.  Patient transferred to the emergency department at [facility] under care of Dr. Aggie Hacker.  Neurosurgery has been consulted as well and recommended transfer to the ED at the outside facility as well.  Patient required no further intervention while under my care.  Patient will be transported by ambulance to [facility].    Disposition:  Transferred to [facility]    Encounter Diagnoses   Name Primary?   . Tongue biting Yes   . Seizure-like activity (CMS HCC)        Truitt Merle, MD  01/08/2019, 19:54

## 2019-01-08 NOTE — Incoming ED Transfer Note (Signed)
Brought in [facility]  Possible seizure  Hx of brain surgery for CA  Trach g-tube, PICC LUE  10LNRB  No seizure activity seen at outside facility  Coming here for neurology.   Normally wears a helmet but helmet was left at nursing home  Daughter wants called with update when pt gets here.

## 2019-01-08 NOTE — Telephone Encounter (Addendum)
Regarding: Margart Sickles  ----- Message from Dorann Ou sent at 01/08/2019  1:23 PM EDT -----  The Nurse case manager at [facility] is requesting a follow up for [facility] with Dr Margart Sickles with a thyroid u/s and an MRI of the brain       She would like to have these appointment scheduled on the same day if possible      Patient is declining    Please call Vaughan Basta at [phone]  ------------------------------------  Returned call and spoke with Vaughan Basta at care facility.  Advised [facility] would not be able to order U/S thyroid without supporting assessment and documentation.  Provided number for contact to get CT and RPV with Dr. Margart Sickles rescheduled.  Vaughan Basta verbalized understanding.  Jenita Seashore, RN  1/69/4503, 14:36

## 2019-01-08 NOTE — ED Attending Note (Signed)
[facility]  Department of Emergency Medicine        Chief Complaint:    Tongue biting/ seizure like activity    History of Present Illness:    58 y.o. White female PMH brain cancer s/p left temporal craniotomy for resection of left tentorial meningioma in Dec 2019 at [facility] by Dr Lisbeth Ply trach and peg placed at that time, in vegetative state at [facility] for tongue biting which NH was concerned was seizure activity. Chart review shows Keppra 1500 per g-tube bid and Phenytoin 100 mg q8 hours via g-tube and Vimpat 200 bid via g-tube    Review of Systems:    Unable to assess due to vegetative state    All other systems were reviewed and were negative except for what is mentioned in the HPI.    Past Medical History:  Past Medical History:   Diagnosis Date   . Anxiety    . Arthropathy    . Cancer (CMS HCC)     brain tumor   . Dyspnea on exertion    . Esophageal reflux     does not take meds   . Heart murmur     benign   . HTN (hypertension)    . Hyperlipemia    . Muscle weakness     right sided weakness   . Palpitations    . Shortness of breath    . Wears glasses     reading     Past Surgical History:   Procedure Laterality Date   . Hx appendectomy     . Hx breast augmentation Bilateral    . Hx lap cholecystectomy     . Hx lumbar diskectomy  1999   . Hx other Right    . Hx other     . Hx tubal ligation         Above history reviewed with patient.  Allergies, medication list and old records also reviewed.     Social History:    Social History     Tobacco Use   . Smoking status: Current Every Day Smoker     Packs/day: 1.00     Years: 30.00     Pack years: 30.00     Types: Cigarettes   . Smokeless tobacco: Never Used   . Tobacco comment: down to 8 cigarettes, counseled on 1-800-QUIT_NOW   Substance Use Topics   . Alcohol use: Never     Frequency: Never   . Drug use: Never         Filed Vitals:    01/08/19 1809   BP: (!) 123/55   Pulse: 85   Resp: (!) 24   Temp: 36.6 C (97.9 F)   SpO2: 100%       Physical  Exam:     Nursing note and vitals reviewed.  Vital signs reviewed as above.     Constitutional: Pt is older than stated age, debilitated, minimally responsive, wakes to light touch, nonverbal, doesn't initiate movement on her own  Head: Normocephalic, atraumatic  Eyes: PERRLA. EOMI. No conjunctival injection  Ears: Grossly normal  Nose: Nares patent, no rhinorrhea  Mouth: Moist pink mucus membranes, copious secretions  Neck: Trach with copious clear secretions  Cardiac: RRR, no M/R/G  Pulm: CTAB, no wheezes, rhonchi. No inc WOB.   Abdomen: SNT, nondistended, Peg tube in place  MSK: Nl ROM, no deformities  Neuro: Face symmetric, owngoing toes, no posturing, localizes pain,   Skin: Nl color, turgor, temp.  No rash   Psych: Blinks when you ask her to      Labs:    Results for orders placed or performed during the hospital encounter of 01/08/19   COMPREHENSIVE METABOLIC PANEL, NON-FASTING   Result Value Ref Range    SODIUM 137 137 - 145 mmol/L    POTASSIUM 3.3 (L) 3.5 - 5.1 mmol/L    CHLORIDE 94 (L) 98 - 107 mmol/L    CO2 TOTAL 38 (H) 22 - 32 mmol/L    ANION GAP 5 (L) 10 - 20 mmol/L    BUN 28 (H) 8 - 25 mg/dL    CREATININE 0.30 (L) 0.49 - 1.10 mg/dL    BUN/CREA RATIO 93     ESTIMATED GFR 126 mL/min/1.8m^2    ALBUMIN 3.7 3.5 - 5.0 g/dL    CALCIUM 10.3 8.5 - 10.4 mg/dL    GLUCOSE 115 (H) 70 - 105 mg/dL    ALKALINE PHOSPHATASE 177 (H) 38 - 126 U/L    ALT (SGPT) 31 <=35 U/L    AST (SGOT) 30 14 - 36 U/L    BILIRUBIN TOTAL 0.2 (L) 0.3 - 1.3 mg/dL    PROTEIN TOTAL 7.3 6.0 - 8.0 g/dL   CREATINE KINASE (CK), TOTAL, SERUM   Result Value Ref Range    CREATINE KINASE <20 (L) 30 - 135 U/L   LACTIC ACID LEVEL W/ REFLEX FOR LEVEL >2.0   Result Value Ref Range    LACTIC ACID 1.2 0.7 - 2.1 mmol/L   MAGNESIUM   Result Value Ref Range    MAGNESIUM 1.9 1.6 - 2.5 mg/dL   PT/INR   Result Value Ref Range    PROTHROMBIN TIME 12.3 9.4 - 12.5 seconds    INR 1.06 <=4.50   PTT (PARTIAL THROMBOPLASTIN TIME)   Result Value Ref Range    APTT 39.7 (H)  25.1 - 36.5 seconds   THYROID STIMULATING HORMONE (SENSITIVE TSH)   Result Value Ref Range    TSH 0.826 0.350 - 5.000 uIU/mL   TROPONIN-I   Result Value Ref Range    TROPONIN I <=0.01 <=0.03 ng/mL   CBC WITH DIFF   Result Value Ref Range    WBC 10.5 3.7 - 11.0 x10^3/uL    RBC 4.22 3.85 - 5.22 x10^6/uL    HGB 12.2 11.5 - 16.0 g/dL    HCT 38.0 34.8 - 46.0 %    MCV 90.0 78.0 - 100.0 fL    MCH 28.9 26.0 - 32.0 pg    MCHC 32.1 31.0 - 35.5 g/dL    RDW-CV 14.9 11.5 - 15.5 %    PLATELETS 319 150 - 400 x10^3/uL    MPV 10.9 8.7 - 12.5 fL    NEUTROPHIL % 74 %    LYMPHOCYTE % 13 %    MONOCYTE % 8 %    EOSINOPHIL % 4 %    BASOPHIL % 1 %    NEUTROPHIL # 7.88 (H) 1.50 - 7.70 x10^3/uL    LYMPHOCYTE # 1.32 1.00 - 4.80 x10^3/uL    MONOCYTE # 0.83 0.20 - 1.10 x10^3/uL    EOSINOPHIL # 0.38 <=0.50 x10^3/uL    BASOPHIL # 0.05 <=0.20 x10^3/uL    IMMATURE GRANULOCYTE % 0 0 - 1 %    IMMATURE GRANULOCYTE # <0.04 <0.10 x10^3/uL       Imaging:     CT head  CXR      MDM:     DDX: seizure vs other cause of jaw clenching  Labs, UA, CT head, CXR    Requring trach suctioning frequently here, also drooling copiously  Will hold off on adminstering glycopyrrolate    WBC 10.5 Lactic Neg. Afebrile    CT head shows  Post operative changes on the left which is associated with interval  increase in brain herniation through the craniotomy with secondary  ventricular dilation. No hemorrhage or other acute process    CXR shows atelectasis no acute process    Call into [facility], neurosurgeon Dr Sherrilee Gilles we speak with neurology Dr Joseph Pierini. Dr Joseph Pierini thinks ED to ED transfer is best. Dr Aggie Hacker accepting    Impression:     Encounter Diagnoses   Name Primary?   . Tongue biting Yes   . Seizure-like activity (CMS HCC)        Disposition/Plan:    Plan transfer to [facility] ED

## 2019-01-08 NOTE — ED Nurses Note (Signed)
Report given to Anda Kraft, Therapist, sports at [facility].

## 2019-01-08 NOTE — ED Nurses Note (Signed)
Report to VMT given and patient left in stable condition.

## 2019-01-08 NOTE — ED Triage Notes (Signed)
Pt arrived via ems from [facility] for possible seizure activity. Pt had brain surgery for ca . Pt has trach, g tube, PICC line left arm in place. On O2 and is vegetative state

## 2019-01-09 ENCOUNTER — Inpatient Hospital Stay (HOSPITAL_COMMUNITY): Payer: MEDICAID

## 2019-01-09 ENCOUNTER — Encounter (HOSPITAL_COMMUNITY): Payer: Self-pay

## 2019-01-09 DIAGNOSIS — R4182 Altered mental status, unspecified: Secondary | ICD-10-CM

## 2019-01-09 DIAGNOSIS — G919 Hydrocephalus, unspecified: Secondary | ICD-10-CM

## 2019-01-09 DIAGNOSIS — R569 Unspecified convulsions: Secondary | ICD-10-CM

## 2019-01-09 DIAGNOSIS — E876 Hypokalemia: Secondary | ICD-10-CM

## 2019-01-09 DIAGNOSIS — J189 Pneumonia, unspecified organism: Secondary | ICD-10-CM

## 2019-01-09 DIAGNOSIS — R627 Adult failure to thrive: Secondary | ICD-10-CM

## 2019-01-09 DIAGNOSIS — R403 Persistent vegetative state: Secondary | ICD-10-CM | POA: Diagnosis present

## 2019-01-09 DIAGNOSIS — Z515 Encounter for palliative care: Secondary | ICD-10-CM

## 2019-01-09 HISTORY — DX: Pneumonia, unspecified organism: J18.9

## 2019-01-09 HISTORY — DX: Unspecified convulsions (CMS HCC): R56.9

## 2019-01-09 LAB — AMMONIA: AMMONIA: 38 umol/L (ref 15–50)

## 2019-01-09 LAB — TYPE AND SCREEN
ABO/RH(D): O POS
ANTIBODY SCREEN: NEGATIVE

## 2019-01-09 LAB — URINALYSIS, MACROSCOPIC
BLOOD: NEGATIVE mg/dL
COLOR: NORMAL
GLUCOSE: NEGATIVE mg/dL
KETONES: NEGATIVE mg/dL
NITRITE: NEGATIVE
PH: 5 (ref 5.0–8.0)
PROTEIN: NEGATIVE mg/dL
SARS-CoV-2: NOT DETECTED mg/dL — AB
SPECIFIC GRAVITY: 1.018 (ref 1.005–1.030)
UROBILINOGEN: NEGATIVE mg/dL

## 2019-01-09 LAB — ECG 12-LEAD
Atrial Rate: 96 {beats}/min
Calculated P Axis: 23 degrees
Calculated R Axis: -23 degrees
Calculated T Axis: 50 degrees
PR Interval: 136 ms
QRS Duration: 104 ms
QT Interval: 388 ms
QTC Calculation: 490 ms
Ventricular rate: 96 {beats}/min

## 2019-01-09 LAB — MAGNESIUM: MAGNESIUM: 1.8 mg/dL (ref 1.6–2.6)

## 2019-01-09 LAB — URINALYSIS, MICROSCOPIC
HYALINE CASTS: 9 /LPF — ABNORMAL HIGH (ref <4.0)
HYALINE CASTS: 9 /lpf — ABNORMAL HIGH (ref <4.0)
RBCS: 0 /HPF (ref <6.0)
WBCS: 8 /HPF (ref <11.0)

## 2019-01-09 LAB — VENOUS BLOOD GAS/LACTATE
%FIO2 (VENOUS): 30 %
BASE EXCESS: 7.8 mmol/L — ABNORMAL HIGH (ref ?–3.0)
BICARBONATE (VENOUS): 30.9 mmol/L — ABNORMAL HIGH (ref 22.0–26.0)
LACTATE: 1.1 mmol/L (ref 0.0–1.3)
PCO2 (VENOUS): 49 mmHg (ref 41.00–51.00)
PH (VENOUS): 7.44 — ABNORMAL HIGH (ref 7.31–7.41)
PO2 (VENOUS): 61 mmHg — ABNORMAL HIGH (ref 35.0–50.0)

## 2019-01-09 LAB — BASIC METABOLIC PANEL
ANION GAP: 8 mmol/L (ref 4–13)
ANION GAP: 10 mmol/L (ref 4–13)
BUN/CREA RATIO: 46 — ABNORMAL HIGH (ref 6–22)
BUN/CREA RATIO: 35 — ABNORMAL HIGH (ref 6–22)
BUN: 25 mg/dL (ref 8–25)
BUN: 22 mg/dL (ref 8–25)
CALCIUM: 8.8 mg/dL (ref 8.5–10.2)
CALCIUM: 9.1 mg/dL (ref 8.5–10.2)
CHLORIDE: 99 mmol/L (ref 96–111)
CHLORIDE: 102 mmol/L (ref 96–111)
CO2 TOTAL: 33 mmol/L — ABNORMAL HIGH (ref 22–32)
CO2 TOTAL: 28 mmol/L (ref 22–32)
CREATININE: 0.54 mg/dL (ref 0.49–1.10)
CREATININE: 0.63 mg/dL (ref 0.49–1.10)
ESTIMATED GFR: >60 mL/min/{1.73_m2} (ref >60)
ESTIMATED GFR: >60 mL/min/{1.73_m2} (ref >60)
GLUCOSE: 146 mg/dL — ABNORMAL HIGH (ref 65–139)
GLUCOSE: 143 mg/dL — ABNORMAL HIGH (ref 65–139)
POTASSIUM: 4.3 mmol/L (ref 3.5–5.1)
POTASSIUM: 3 mmol/L — ABNORMAL LOW (ref 3.5–5.1)
SODIUM: 140 mmol/L (ref 136–145)
SODIUM: 140 mmol/L (ref 136–145)

## 2019-01-09 LAB — CBC WITH DIFF
BASOPHIL #: <0.1 10*3/uL (ref <=0.20)
BASOPHIL %: 0 %
EOSINOPHIL #: <0.1 10*3/uL (ref <=0.50)
EOSINOPHIL %: 0 %
HCT: 29.9 % — ABNORMAL LOW (ref 34.8–46.0)
HGB: 9.6 g/dL — ABNORMAL LOW (ref 11.5–16.0)
IMMATURE GRANULOCYTE #: <0.1 10*3/uL (ref <0.10)
IMMATURE GRANULOCYTE %: 1 % (ref 0–1)
LYMPHOCYTE #: 0.97 10*3/uL — ABNORMAL LOW (ref 1.00–4.80)
LYMPHOCYTE %: 10 %
MCH: 28.4 pg (ref 26.0–32.0)
MCHC: 32.1 g/dL (ref 31.0–35.5)
MCV: 88.5 fL (ref 78.0–100.0)
MONOCYTE #: 0.29 10*3/uL (ref 0.20–1.10)
MONOCYTE %: 3 %
MPV: 10 fL (ref 8.7–12.5)
NEUTROPHIL #: 8.05 10*3/uL — ABNORMAL HIGH (ref 1.50–7.70)
NEUTROPHIL %: 86 %
PLATELETS: 301 10*3/uL (ref 150–400)
RBC: 3.38 10*6/uL — ABNORMAL LOW (ref 3.85–5.22)
RDW-CV: 15 % (ref 11.5–15.5)
WBC: 9.4 10*3/uL (ref 3.7–11.0)

## 2019-01-09 LAB — MRSA SCREEN, PCR, RAPID: MRSA COLONIZATION SCREEN: POSITIVE — AB

## 2019-01-09 LAB — PHOSPHORUS: PHOSPHORUS: 3.9 mg/dL (ref 2.4–4.7)

## 2019-01-09 LAB — LEGIONELLA URINE ANTIGEN: LEGIONELLA ANTIGEN: NEGATIVE

## 2019-01-09 LAB — LACTIC ACID LEVEL
LACTIC ACID: 3.2 mmol/L — ABNORMAL HIGH (ref 0.5–2.2)
LACTIC ACID: 1.1 mmol/L (ref 0.5–2.2)

## 2019-01-09 LAB — STREPTOCOCCUS PNEUMONIAE ANTIGEN,URINE: S.PNEUMONIA ANTIGEN: NEGATIVE

## 2019-01-09 LAB — ADULT ROUTINE BLOOD CULTURE, SET OF 2 BOTTLES (BACTERIA AND YEAST): BLOOD CULTURE, ROUTINE: NO GROWTH

## 2019-01-09 LAB — PROCALCITONIN: PROCALCITONIN: <0.05 ng/mL (ref <0.50)

## 2019-01-09 LAB — HGA1C (HEMOGLOBIN A1C WITH EST AVG GLUCOSE)
ESTIMATED AVERAGE GLUCOSE: 111 mg/dL
HEMOGLOBIN A1C: 5.5 % (ref 4.0–5.6)

## 2019-01-09 LAB — COVID-19 ~~LOC~~ MOLECULAR LAB TESTING: SARS-CoV-2: NOT DETECTED

## 2019-01-09 MED ORDER — VANCOMYCIN IV - PHARMACIST TO DOSE PER PROTOCOL - NO EXCLUSION CRITERIA
Freq: Every day | Status: DC | PRN
Start: 2019-01-09 — End: 2019-01-12

## 2019-01-09 MED ORDER — CLOBAZAM 10 MG TABLET
20.00 mg | ORAL_TABLET | Freq: Three times a day (TID) | ORAL | Status: DC
Start: 2019-01-09 — End: 2019-01-09

## 2019-01-09 MED ORDER — CEFEPIME 2 GRAM SOLUTION FOR INJECTION
2.0000 g | INTRAMUSCULAR | Status: DC
Start: 2019-01-09 — End: 2019-01-09

## 2019-01-09 MED ORDER — PIPERACILLIN-TAZOBACTAM 4.5 GRAM INTRAVENOUS SOLUTION
4.5000 g | Freq: Three times a day (TID) | INTRAVENOUS | Status: DC
Start: 2019-01-09 — End: 2019-01-10
  Administered 2019-01-09 (×2): 4.5 g via INTRAVENOUS
  Administered 2019-01-09 (×2): 0 g via INTRAVENOUS
  Administered 2019-01-10 (×2): 4.5 g via INTRAVENOUS
  Administered 2019-01-10 (×2): 0 g via INTRAVENOUS
  Filled 2019-01-09 (×8): qty 20

## 2019-01-09 MED ORDER — CLOBAZAM 10 MG TABLET
20.00 mg | ORAL_TABLET | Freq: Three times a day (TID) | ORAL | Status: DC
Start: 2019-01-09 — End: 2019-01-15
  Administered 2019-01-09 – 2019-01-15 (×19): 20 mg via GASTROSTOMY
  Filled 2019-01-09 (×19): qty 2

## 2019-01-09 MED ORDER — CLOBAZAM 2.5 MG/ML ORAL SUSPENSION
20.00 mg | Freq: Three times a day (TID) | ORAL | Status: DC
Start: 2019-01-09 — End: 2019-01-09
  Administered 2019-01-09: 05:00:00 20 mg via GASTROSTOMY
  Filled 2019-01-09: qty 7
  Filled 2019-01-09: qty 1

## 2019-01-09 MED ORDER — LACOSAMIDE 100 MG TABLET
200.00 mg | ORAL_TABLET | Freq: Two times a day (BID) | ORAL | Status: DC
Start: 2019-01-09 — End: 2019-01-15
  Administered 2019-01-09 – 2019-01-15 (×13): 200 mg via GASTROSTOMY
  Filled 2019-01-09 (×13): qty 2

## 2019-01-09 MED ORDER — ALBUMIN, HUMAN 5 % INTRAVENOUS SOLUTION
INTRAVENOUS | Status: DC
Start: 2019-01-09 — End: 2019-01-09
  Filled 2019-01-09: qty 500

## 2019-01-09 MED ORDER — IPRATROPIUM 0.5 MG-ALBUTEROL 3 MG (2.5 MG BASE)/3 ML NEBULIZATION SOLN
3.0000 mL | INHALATION_SOLUTION | Freq: Four times a day (QID) | RESPIRATORY_TRACT | Status: DC
Start: 2019-01-09 — End: 2019-01-12
  Administered 2019-01-09 (×4): 3 mL via RESPIRATORY_TRACT
  Administered 2019-01-10: 16:00:00 0 mL via RESPIRATORY_TRACT
  Administered 2019-01-10 – 2019-01-12 (×8): 3 mL via RESPIRATORY_TRACT
  Filled 2019-01-09: qty 9
  Filled 2019-01-09 (×3): qty 3

## 2019-01-09 MED ORDER — SODIUM CHLORIDE 0.9 % (FLUSH) INJECTION SYRINGE
2.0000 mL | INJECTION | Freq: Three times a day (TID) | INTRAMUSCULAR | Status: DC
Start: 2019-01-09 — End: 2019-01-15
  Administered 2019-01-09: 14:00:00 0 mL
  Administered 2019-01-09 – 2019-01-10 (×3): 2 mL
  Administered 2019-01-10 (×2): 0 mL
  Administered 2019-01-11 (×2): 2 mL
  Administered 2019-01-11: 0 mL
  Administered 2019-01-12: 14:00:00 2 mL
  Administered 2019-01-12 – 2019-01-13 (×3): 0 mL
  Administered 2019-01-13: 2 mL
  Administered 2019-01-13: 0 mL
  Administered 2019-01-14: 06:00:00 2 mL
  Administered 2019-01-14: 0 mL
  Administered 2019-01-14 – 2019-01-15 (×2): 2 mL
  Administered 2019-01-15: 06:00:00 0 mL

## 2019-01-09 MED ORDER — POTASSIUM BICARBONATE-CITRIC ACID 20 MEQ EFFERVESCENT TABLET
20.0000 meq | EFFERVESCENT_TABLET | Freq: Once | ORAL | Status: DC
Start: 2019-01-09 — End: 2019-01-09

## 2019-01-09 MED ORDER — LORAZEPAM 2 MG/ML INJECTION SOLUTION
INTRAMUSCULAR | Status: DC
Start: 2019-01-09 — End: 2019-01-09
  Filled 2019-01-09: qty 1

## 2019-01-09 MED ORDER — POTASSIUM CHLORIDE 10 MEQ/100ML IN STERILE WATER INTRAVENOUS PIGGYBACK
10.0000 meq | INJECTION | INTRAVENOUS | Status: DC
Start: 2019-01-09 — End: 2019-01-09
  Administered 2019-01-09: 09:00:00

## 2019-01-09 MED ORDER — PHENYTOIN 50 MG CHEWABLE TABLET
100.0000 mg | CHEWABLE_TABLET | Freq: Three times a day (TID) | ORAL | Status: DC
Start: 2019-01-09 — End: 2019-01-15
  Administered 2019-01-09: 07:00:00
  Administered 2019-01-09: 05:00:00 100 mg via GASTROSTOMY
  Administered 2019-01-09 – 2019-01-10 (×4)
  Administered 2019-01-10: 0 mg via GASTROSTOMY
  Administered 2019-01-10: 14:00:00
  Administered 2019-01-11 – 2019-01-15 (×14): 100 mg via GASTROSTOMY
  Filled 2019-01-09 (×21): qty 2

## 2019-01-09 MED ORDER — SODIUM CHLORIDE 0.9 % INTRAVENOUS SOLUTION
1500.00 mg | INTRAVENOUS | Status: AC
Start: 2019-01-09 — End: 2019-01-09
  Administered 2019-01-09: 1500 mg via INTRAVENOUS
  Administered 2019-01-09: 01:00:00 0 mg via INTRAVENOUS
  Filled 2019-01-09: qty 15

## 2019-01-09 MED ORDER — ALBUMIN, HUMAN 5 % INTRAVENOUS SOLUTION
25.0000 g | INTRAVENOUS | Status: AC
Start: 2019-01-09 — End: 2019-01-09
  Administered 2019-01-09: 25 g via INTRAVENOUS
  Administered 2019-01-09: 0 g via INTRAVENOUS

## 2019-01-09 MED ORDER — LORAZEPAM 2 MG/ML INJECTION SOLUTION
3.00 mg | INTRAMUSCULAR | Status: AC
Start: 2019-01-09 — End: 2019-01-09

## 2019-01-09 MED ORDER — NUTRITION PROTEIN SUPPLEMENT - TUBE FEED
1.0000 | Freq: Two times a day (BID) | Status: DC
Start: 2019-01-09 — End: 2019-01-12
  Administered 2019-01-09 – 2019-01-12 (×7): 15 g via GASTROSTOMY

## 2019-01-09 MED ORDER — VANCOMYCIN 10 GRAM INTRAVENOUS SOLUTION
18.00 mg/kg | Freq: Two times a day (BID) | INTRAVENOUS | Status: DC
Start: 2019-01-09 — End: 2019-01-12
  Administered 2019-01-09: 1250 mg via INTRAVENOUS
  Administered 2019-01-09 – 2019-01-10 (×3): 0 mg via INTRAVENOUS
  Administered 2019-01-10 (×2): 1250 mg via INTRAVENOUS
  Administered 2019-01-11: 0 mg via INTRAVENOUS
  Administered 2019-01-11 (×2): 1250 mg via INTRAVENOUS
  Administered 2019-01-11 – 2019-01-12 (×2): 0 mg via INTRAVENOUS
  Filled 2019-01-09 (×8): qty 12.5

## 2019-01-09 MED ORDER — CEFEPIME 2 GRAM SOLUTION FOR INJECTION
2.0000 g | Freq: Three times a day (TID) | INTRAMUSCULAR | Status: DC
Start: 2019-01-09 — End: 2019-01-09

## 2019-01-09 MED ORDER — LORAZEPAM 2 MG/ML INJECTION SOLUTION
1.00 mg | INTRAMUSCULAR | Status: DC
Start: 2019-01-09 — End: 2019-01-09

## 2019-01-09 MED ORDER — LORAZEPAM 2 MG/ML INJECTION SOLUTION
1.00 mg | INTRAMUSCULAR | Status: AC
Start: 2019-01-09 — End: 2019-01-09
  Administered 2019-01-09: 1 mg via INTRAVENOUS

## 2019-01-09 MED ORDER — HEPARIN (PORCINE) 5,000 UNIT/ML INJECTION SOLUTION
5000.0000 [IU] | Freq: Three times a day (TID) | INTRAMUSCULAR | Status: AC
Start: 2019-01-09 — End: 2019-01-09
  Administered 2019-01-09 (×3): 5000 [IU] via SUBCUTANEOUS
  Filled 2019-01-09 (×3): qty 1

## 2019-01-09 MED ORDER — POLYETHYLENE GLYCOL 3350 17 GRAM ORAL POWDER PACKET
17.0000 g | Freq: Every day | ORAL | Status: DC
Start: 2019-01-09 — End: 2019-01-15
  Administered 2019-01-09 – 2019-01-11 (×3): 17 g via GASTROSTOMY
  Administered 2019-01-12 – 2019-01-13 (×2): 0 g via GASTROSTOMY
  Administered 2019-01-14: 09:00:00 17 g via GASTROSTOMY
  Administered 2019-01-15: 09:00:00 0 g via GASTROSTOMY
  Filled 2019-01-09 (×4): qty 1

## 2019-01-09 MED ORDER — ELECTROLYTE-A INTRAVENOUS SOLUTION BOLUS
1000.00 mL | Freq: Once | INTRAVENOUS | Status: AC
Start: 2019-01-09 — End: 2019-01-09
  Administered 2019-01-09: 07:00:00 1000 mL via INTRAVENOUS
  Administered 2019-01-09: 0 mL via INTRAVENOUS

## 2019-01-09 MED ORDER — LEVETIRACETAM 500 MG TABLET
1500.0000 mg | ORAL_TABLET | Freq: Two times a day (BID) | ORAL | Status: DC
Start: 2019-01-09 — End: 2019-01-15
  Administered 2019-01-09 – 2019-01-15 (×13): 1500 mg via GASTROSTOMY
  Filled 2019-01-09 (×13): qty 3

## 2019-01-09 MED ORDER — ELECTROLYTE-A INTRAVENOUS SOLUTION BOLUS
1000.00 mL | Freq: Once | INTRAVENOUS | Status: AC
Start: 2019-01-09 — End: 2019-01-09
  Administered 2019-01-09: 04:00:00 1000 mL via INTRAVENOUS
  Administered 2019-01-09: 0 mL via INTRAVENOUS

## 2019-01-09 MED ORDER — ALTEPLASE 1 MG/2 ML SYRINGE
1.0000 mg | INJECTION | Freq: Once | INTRAMUSCULAR | Status: AC
Start: 2019-01-09 — End: 2019-01-09
  Administered 2019-01-09 (×2): 1 mg
  Filled 2019-01-09 (×2): qty 2

## 2019-01-09 MED ORDER — LORAZEPAM 2 MG/ML INJECTION SOLUTION
INTRAMUSCULAR | Status: AC
Start: 2019-01-09 — End: 2019-01-09
  Administered 2019-01-09: 3 mg via INTRAVENOUS
  Filled 2019-01-09: qty 2

## 2019-01-09 MED ORDER — POTASSIUM BICARBONATE-CITRIC ACID 20 MEQ EFFERVESCENT TABLET
40.0000 meq | EFFERVESCENT_TABLET | Freq: Once | ORAL | Status: AC
Start: 2019-01-09 — End: 2019-01-09
  Administered 2019-01-09: 40 meq via GASTROSTOMY
  Filled 2019-01-09: qty 2

## 2019-01-09 MED ORDER — SODIUM CHLORIDE 0.9 % INTRAVENOUS SOLUTION
INTRAVENOUS | Status: DC
Start: 2019-01-10 — End: 2019-01-10

## 2019-01-09 MED ORDER — FAMOTIDINE 20 MG TABLET
20.0000 mg | ORAL_TABLET | Freq: Two times a day (BID) | ORAL | Status: DC
Start: 2019-01-09 — End: 2019-01-15
  Administered 2019-01-09 – 2019-01-15 (×13): 20 mg via GASTROSTOMY
  Filled 2019-01-09 (×13): qty 1

## 2019-01-09 MED ORDER — SENNA LEAF EXTRACT 176 MG/5 ML ORAL SYRUP
10.0000 mL | ORAL_SOLUTION | Freq: Two times a day (BID) | ORAL | Status: DC
Start: 2019-01-09 — End: 2019-01-15
  Administered 2019-01-09 – 2019-01-11 (×6): 352 mg via GASTROSTOMY
  Administered 2019-01-12 (×2): 0 mg via GASTROSTOMY
  Administered 2019-01-13: 20:00:00 352 mg via GASTROSTOMY
  Administered 2019-01-13: 09:00:00 0 mg via GASTROSTOMY
  Administered 2019-01-14 (×2): 352 mg via GASTROSTOMY
  Administered 2019-01-15: 09:00:00 0 mg via GASTROSTOMY
  Filled 2019-01-09 (×9): qty 15

## 2019-01-09 MED ORDER — LORAZEPAM 2 MG/ML INJECTION SOLUTION
3.00 mg | INTRAMUSCULAR | Status: AC
Start: 2019-01-09 — End: 2019-01-09
  Administered 2019-01-09: 3 mg via INTRAVENOUS

## 2019-01-09 MED ORDER — SODIUM CHLORIDE 0.9 % (FLUSH) INJECTION SYRINGE
2.0000 mL | INJECTION | INTRAMUSCULAR | Status: DC | PRN
Start: 2019-01-09 — End: 2019-01-15

## 2019-01-09 MED ORDER — VANCOMYCIN 10 GRAM INTRAVENOUS SOLUTION
20.00 mg/kg | INTRAVENOUS | Status: AC
Start: 2019-01-09 — End: 2019-01-09
  Administered 2019-01-09: 0 mg via INTRAVENOUS
  Administered 2019-01-09: 1500 mg via INTRAVENOUS
  Filled 2019-01-09: qty 15

## 2019-01-09 MED ORDER — SODIUM CHLORIDE 0.9 % INTRAVENOUS PIGGYBACK
4.5000 g | INTRAVENOUS | Status: AC
Start: 2019-01-09 — End: 2019-01-09
  Administered 2019-01-09: 0 g via INTRAVENOUS
  Administered 2019-01-09: 4.5 g via INTRAVENOUS
  Filled 2019-01-09 (×2): qty 20

## 2019-01-09 MED ORDER — POTASSIUM BICARBONATE-CITRIC ACID 20 MEQ EFFERVESCENT TABLET
40.00 meq | EFFERVESCENT_TABLET | Freq: Three times a day (TID) | ORAL | Status: DC
Start: 2019-01-09 — End: 2019-01-09
  Administered 2019-01-09: 09:00:00

## 2019-01-09 MED ORDER — LORAZEPAM 2 MG/ML INJECTION SOLUTION
INTRAMUSCULAR | Status: DC
Start: 2019-01-09 — End: 2019-01-09
  Administered 2019-01-09: 01:00:00
  Filled 2019-01-09: qty 1

## 2019-01-09 MED ADMIN — Medication: INTRAVENOUS | @ 04:00:00

## 2019-01-09 MED ADMIN — lacosamide 100 mg tablet: GASTROSTOMY | @ 05:00:00

## 2019-01-09 MED ADMIN — albumin, human 5 % intravenous solution: @ 09:00:00

## 2019-01-09 MED ADMIN — LORazepam 2 mg/mL injection solution: @ 01:00:00

## 2019-01-09 MED ADMIN — Medication: INTRAVENOUS | @ 02:00:00

## 2019-01-09 MED ADMIN — polyethylene glycol 3350 17 gram oral powder packet: GASTROSTOMY | @ 08:00:00

## 2019-01-09 MED ADMIN — Medication: INTRAVENOUS | @ 18:00:00

## 2019-01-09 MED ADMIN — cloBAZam 10 mg tablet: GASTROSTOMY | @ 22:00:00

## 2019-01-09 MED ADMIN — potassium bicarbonate-citric acid 20 mEq effervescent tablet: @ 09:00:00

## 2019-01-09 MED ADMIN — electrolyte-A intravenous solution: INTRAVENOUS | @ 08:00:00

## 2019-01-09 MED ADMIN — sodium chloride 0.9 % (flush) injection syringe: @ 14:00:00

## 2019-01-09 MED ADMIN — alteplase 50 mg intravenous solution: @ 07:00:00

## 2019-01-09 MED ADMIN — phenytoin 50 mg chewable tablet: @ 22:00:00

## 2019-01-09 MED ADMIN — LORazepam 2 mg/mL injection solution: INTRAVENOUS

## 2019-01-09 MED ADMIN — potassium chloride 10 mEq/100mL in sterile water intravenous piggyback: @ 09:00:00

## 2019-01-09 MED ADMIN — Medication: INTRAVENOUS

## 2019-01-09 MED ADMIN — phenytoin 50 mg chewable tablet: @ 14:00:00

## 2019-01-09 MED ADMIN — sodium chloride 0.9 % (flush) injection syringe: @ 22:00:00

## 2019-01-09 MED ADMIN — Medication: INTRAVENOUS | @ 11:00:00

## 2019-01-09 MED ADMIN — Medication: INTRAVENOUS | @ 15:00:00

## 2019-01-09 MED ADMIN — amino ac-protein hydro-whey protein 10 gram-100 kcal/30 mL oral liquid: GASTROSTOMY | @ 21:00:00

## 2019-01-09 MED ADMIN — levETIRAcetam 500 mg tablet: GASTROSTOMY | @ 22:00:00

## 2019-01-09 MED ADMIN — ipratropium 0.5 mg-albuteroL 3 mg (2.5 mg base)/3 mL nebulization soln: RESPIRATORY_TRACT | @ 16:00:00

## 2019-01-09 MED ADMIN — heparin (porcine) 5,000 unit/mL injection solution: SUBCUTANEOUS | @ 05:00:00

## 2019-01-09 MED ADMIN — Medication: INTRAVENOUS | @ 01:00:00

## 2019-01-09 MED ADMIN — cloBAZam 10 mg tablet: GASTROSTOMY | @ 15:00:00

## 2019-01-09 MED ADMIN — levETIRAcetam 500 mg tablet: GASTROSTOMY | @ 05:00:00

## 2019-01-09 MED ADMIN — Medication: INTRAVENOUS | @ 17:00:00

## 2019-01-09 MED ADMIN — phenytoin 50 mg chewable tablet: GASTROSTOMY | @ 05:00:00

## 2019-01-09 NOTE — Consults (Signed)
[facility]  Surgery Initial Consult  [place], 58 y.o., female  Date of Birth: 25-Sep-1960  Date of Service: 01/09/19   Inpatient Admission Date: 01/08/2019      Information Obtained from: patient and health care provider  Chief Complaint: Increased ICP/Seizure    PCP: Isac Caddy, MD    Consult Requested By: Thedore Mins and Neurosurgery      IMPRESSION:    This is a 58 y.o. 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     Active Hospital Problems   (*Primary Problem)    Diagnosis   . *Seizure (CMS [place])   . Vegetative state (CMS [place])   . CAP (community acquired pneumonia)        Recommendations (if Consult)   -Will need to coordinate timing for surgery with neurosurgery  -General surgery will be available for assistance after 1pm tomorrow.   -Please call or page service with questions     HPI: (must include no less than 4 of the following main descriptors) Location (of pain): Quality (character of pain) Severity (minimal, mild, severe, scale or 1-10) Duration (how long has pain/sx present) Timing (when does pain/sx occur)  Context (activity at/before onset) Modifying Factors (what makes pain/sx  Better/worse) Associate Sign/Sx (what accompanies main pain/sx)     [name] is a 58 y.o. 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 She is a resident of [facility]. On further evaluation, CT brain demonstrated increased herniation and neurosurgery was consulted and plan to do VP shunt. General surgery was consulted to assist with VP shunt placement. Per chart  review, patient has a history of appendectomy, laparoscopic cholecystectomy, tubal ligation, breast augmentation and Trach/PEG tube placed during her previous admission already mentioned.        ROS:  MUST comment on all "Abnormal" findings   ROS Other than ROS in the HPI, all other systems were negative.    PAST MEDICAL/ FAMILY/ SOCIAL HISTORY:       Past Medical History:   Diagnosis Date   . Anxiety    . Arthropathy    . Cancer (CMS HCC)     brain tumor   . CAP (community acquired pneumonia) 01/09/2019   . Dyspnea on exertion    . Esophageal reflux     does not take meds   . Heart murmur     benign   . HTN (hypertension)    . Hx MRSA infection    . Hyperlipemia    . Muscle weakness     right sided weakness   . Palpitations    . Respiratory failure (CMS HCC)    . Seizure (CMS [place]) 01/09/2019   . Shortness of breath    . Wears glasses     reading           No Known Allergies    Prior to Admission medications    Medication Sig Start Date End Date Taking? Authorizing Provider   acetaminophen (TYLENOL) 325 mg Oral Tablet Take 2 Tabs (650 mg total) by mouth Every 6 hours as needed for Pain  Patient taking differently: 650 mg by Gastric (  NG, OG, PEG, GT) route Every 4 hours as needed for Pain  06/09/18   Arsanious, Shanon Brow, MD   amLODIPine (NORVASC) 10 mg Oral Tablet Take 10 mg by mouth Once a day G tube    Provider, Historical   apixaban (ELIQUIS) 2.5 mg Oral Tablet Take 2.5 mg by mouth Twice daily G tube    Provider, Historical   arginine/glutamine/calcium bmb ([place]) Take by mouth Once a day    Provider, Historical   ascorbic acid, vitamin C, (VITAMIN C) 500 mg Oral Tablet 1 Tab (500 mg total) by Gastric (NG, OG, PEG, GT) route Twice daily 08/07/18   Spatzer, Judson Roch, PA-C   aspirin 325 mg Oral Tablet 1 Tab (325 mg total) by Gastric (NG, OG, PEG, GT) route Once a day 08/08/18   Spatzer, Judson Roch, PA-C   chlorhexidine gluconate (PERIDEX) 0.12 % Mucous Membrane Mouthwash 15 mL swish and spit Twice daily    Provider,  Historical   cloBAZam (ONFI) 2.5 mg/mL Oral Suspension 8 mL (20 mg total) by Gastric (NG, OG, PEG, GT) route Every 8 hours 08/07/18   Spatzer, Judson Roch, PA-C   diazePAM (DIASTAT ACUDIAL) 5-7.5-10 mg Rectal Kit 10 mg by Rectal route Every 6 hours as needed    Provider, Historical   diazePAM (VALIUM) 5 mg Oral Tablet Take 5 mg by mouth Twice daily G tube    Provider, Historical   fluconazole (DIFLUCAN) 150 mg Oral Tablet Take 150 mg by mouth Every 7 days On Friday by g tube    Provider, Historical   ipratropium/albuterol sulfate (IPRATROPIUM-ALBUTEROL INHL) Take by inhalation Every 4 hrs as needed for sob    Provider, Historical   lacosamide (VIMPAT) 200 mg Oral Tablet 1 Tab (200 mg total) by Gastric (NG, OG, PEG, GT) route Twice daily 08/07/18   Spatzer, Judson Roch, PA-C   levETIRAcetam (KEPPRA) 750 mg Oral Tablet 2 Tabs (1,500 mg total) by Gastric (NG, OG, PEG, GT) route Twice daily 08/07/18   Spatzer, Judson Roch, PA-C   lisinopriL (PRINIVIL) 40 mg Oral Tablet Take 40 mg by mouth Once a day    Provider, Historical   metoprolol tartrate (LOPRESSOR) 25 mg Oral Tablet Take 100 mg by mouth Twice daily     Provider, Historical   phenytoin (DILANTIN) 100 mg/4 mL Oral Suspension 4 mL (100 mg total) by Gastric (NG, OG, PEG, GT) route Every 8 hours  Patient taking differently: 150 mg by Gastric (NG, OG, PEG, GT) route Every 8 hours  08/07/18   Spatzer, Judson Roch, PA-C       No current outpatient medications on file.       Past Surgical History:   Procedure Laterality Date   . CRANIOTOMY Left 07/05/2018    Performed by [name], MD at [place]   . CRANIOTOMY FOR TUMOR WITH NAVIGATION Left 07/05/2018    Performed by [name], MD at [place]   . CT SCAN N/A 07/05/2018    Performed by [name], MD at [place]   . GASTROSCOPY WITH PLACEMENT PEG TUBE N/A 07/15/2018    Performed by Ann Held, MD at [place]   . HX APPENDECTOMY     . HX BREAST AUGMENTATION Bilateral    . HX LAP CHOLECYSTECTOMY     . HX  LUMBAR DISKECTOMY  1999   . HX OTHER Right     benign tumor removed from upper right leg x3   . HX OTHER  exploratory lap   . HX TUBAL LIGATION     . TRACHEOSTOMY     . TRACHEOSTOMY N/A 07/15/2018    Performed by Ann Held, MD at [place] History:     Problem Relation (Age of Onset)    Ehlers-Danlos syndrome Daughter    Heart Attack Mother    Lymphoma Mother              Social History     Tobacco Use   . Smoking status: Current Every Day Smoker     Packs/day: 1.00     Years: 30.00     Pack years: 30.00     Types: Cigarettes   . Smokeless tobacco: Never Used   . Tobacco comment: down to 8 cigarettes, counseled on 1-800-QUIT_NOW   Substance Use Topics   . Alcohol use: Never     Frequency: Never   . Drug use: Never         PHYSICAL EXAMINATION: MUST comment on all "Abnormal" findings    Exam BP (!) 78/49   Pulse 89   Temp 36.2 C (97.2 F)   Resp (!) 22   Wt 76.2 kg (167 lb 15.9 oz)   SpO2 98%   BMI 26.31 kg/m       General:Unresponsive  Eyes: Sclera non-icteric L eye deviatoin.   HENT: VEEG wires on head, Craniectomy defect on L side.  Neck: Supple, normal ROM  Pulmonary: Unlabored breathing  CV: RR  GI Remotely healed laparoscopic port incisions, RLQ incision, Appearssoft NT ND  Extrem: moves all extremities, 5/5 strength in all extremities  Vascular: All pulses palpable and equal bilaterally  Skin: warm and perfused  Neuro: Unresponsive  Psychosocial: Unable to assess    Labs Ordered/ Reviewed (Please indicate ordered or reviewed)   Reviewed: Labs:  Lab Results Today:    Results for orders placed or performed during the hospital encounter of 01/08/19 (from the past 24 hour(s))   BASIC METABOLIC PANEL   Result Value Ref Range    SODIUM 141 136 - 145 mmol/L    POTASSIUM 3.1 (L) 3.5 - 5.1 mmol/L    CHLORIDE 97 96 - 111 mmol/L    CO2 TOTAL 33 (H) 22 - 32 mmol/L    ANION GAP 11 4 - 13 mmol/L    CALCIUM 10.1 8.5 - 10.2 mg/dL    GLUCOSE 112 65 - 139 mg/dL    BUN 27 (H) 8 - 25  mg/dL    CREATININE 0.51 0.49 - 1.10 mg/dL    BUN/CREA RATIO 53 (H) 6 - 22    ESTIMATED GFR >60 >60 mL/min/1.51m2   PHOSPHORUS   Result Value Ref Range    PHOSPHORUS 4.3 2.4 - 4.7 mg/dL   MAGNESIUM   Result Value Ref Range    MAGNESIUM 1.8 1.6 - 2.6 mg/dL   TROPONIN-I (FOR ED ONLY)   Result Value Ref Range    TROPONIN I 19 0 - 30 ng/L   PHENYTOIN (Dilantin) LEVEL   Result Value Ref Range    PHENYTOIN LEVEL 25.2 (H) 10.0 - 20.0 ug/mL   CBC WITH DIFF   Result Value Ref Range    WBC 14.4 (H) 3.7 - 11.0 x10^3/uL    RBC 4.11 3.85 - 5.22 x10^6/uL    HGB 11.9 11.5 - 16.0 g/dL    HCT 36.3 34.8 - 46.0 %    MCV 88.3 78.0 - 100.0 fL  MCH 29.0 26.0 - 32.0 pg    MCHC 32.8 31.0 - 35.5 g/dL    RDW-CV 14.8 11.5 - 15.5 %    PLATELETS 366 150 - 400 x10^3/uL    MPV 9.7 8.7 - 12.5 fL    NEUTROPHIL % 76 %    LYMPHOCYTE % 13 %    MONOCYTE % 7 %    EOSINOPHIL % 3 %    BASOPHIL % 0 %    NEUTROPHIL # 11.06 (H) 1.50 - 7.70 x10^3/uL    LYMPHOCYTE # 1.82 1.00 - 4.80 x10^3/uL    MONOCYTE # 1.04 0.20 - 1.10 x10^3/uL    EOSINOPHIL # 0.36 <=0.50 x10^3/uL    BASOPHIL # <0.10 <=0.20 x10^3/uL    IMMATURE GRANULOCYTE % 1 0 - 1 %    IMMATURE GRANULOCYTE # <0.10 <0.10 x10^3/uL   PROCALCITONIN   Result Value Ref Range    PROCALCITONIN  <0.05 <0.50 ng/mL   ECG 12-LEAD   Result Value Ref Range    Ventricular rate 96 BPM    Atrial Rate 96 BPM    PR Interval 136 ms    QRS Duration 104 ms    QT Interval 388 ms    QTC Calculation 490 ms    Calculated P Axis 23 degrees    Calculated R Axis -23 degrees    Calculated T Axis 50 degrees   VENOUS BLOOD GAS/LACTATE   Result Value Ref Range    %FIO2 (VENOUS) 100.0 %    PH (VENOUS) 7.44 (H) 7.31 - 7.41    PCO2 (VENOUS) 53.00 (H) 41.00 - 51.00 mm/Hg    PO2 (VENOUS) 52.0 (H) 35.0 - 50.0 mm/Hg    BASE EXCESS 9.9 (H) -3.0 - 3.0 mmol/L    BICARBONATE (VENOUS) 32.4 (H) 22.0 - 26.0 mmol/L    LACTATE 1.1 0.0 - 1.3 mmol/L   COVID-19 SCREENING - Admitted or Likely to be Admitted   Result Value Ref Range    SARS-CoV-2 Not  Detected Not Detected   URINALYSIS, MACROSCOPIC   Result Value Ref Range    SPECIFIC GRAVITY 1.018 1.005 - 1.030    GLUCOSE Negative Negative mg/dL    PROTEIN Negative Negative mg/dL    BILIRUBIN Negative Negative mg/dL    UROBILINOGEN Negative Negative mg/dL    PH 5.0 5.0 - 8.0    BLOOD Negative Negative mg/dL    KETONES Negative Negative mg/dL    NITRITE Negative Negative    LEUKOCYTES Small (A) Negative WBCs/uL    APPEARANCE Cloudy (A) Clear    COLOR Normal (Yellow) Normal (Yellow)   URINALYSIS, MICROSCOPIC   Result Value Ref Range    WBCS 8.0 <11.0 /hpf    RBCS 0.0 <6.0 /hpf    BACTERIA Several (A) Occasional or less /hpf    HYALINE CASTS 9.0 (H) <4.0 /lpf    SQUAMOUS EPITHELIAL CELLS Occasional or less Occasional or less /lpf    MUCOUS Light Light /lpf   BASIC METABOLIC PANEL   Result Value Ref Range    SODIUM 140 136 - 145 mmol/L    POTASSIUM 4.3 3.5 - 5.1 mmol/L    CHLORIDE 99 96 - 111 mmol/L    CO2 TOTAL 33 (H) 22 - 32 mmol/L    ANION GAP 8 4 - 13 mmol/L    CALCIUM 8.8 8.5 - 10.2 mg/dL    GLUCOSE 146 (H) 65 - 139 mg/dL    BUN 25 8 - 25 mg/dL    CREATININE 0.54 0.49 - 1.10  mg/dL    BUN/CREA RATIO 46 (H) 6 - 22    ESTIMATED GFR >60 >60 mL/min/1.28m2   MAGNESIUM   Result Value Ref Range    MAGNESIUM 1.8 1.6 - 2.6 mg/dL   PHOSPHORUS   Result Value Ref Range    PHOSPHORUS 3.9 2.4 - 4.7 mg/dL   MRSA SCREEN, PCR, RAPID   Result Value Ref Range    MRSA COLONIZATION SCREEN Positive (A) Negative   CBC WITH DIFF   Result Value Ref Range    WBC 9.4 3.7 - 11.0 x10^3/uL    RBC 3.38 (L) 3.85 - 5.22 x10^6/uL    HGB 9.6 (L) 11.5 - 16.0 g/dL    HCT 29.9 (L) 34.8 - 46.0 %    MCV 88.5 78.0 - 100.0 fL    MCH 28.4 26.0 - 32.0 pg    MCHC 32.1 31.0 - 35.5 g/dL    RDW-CV 15.0 11.5 - 15.5 %    PLATELETS 301 150 - 400 x10^3/uL    MPV 10.0 8.7 - 12.5 fL    NEUTROPHIL % 86 %    LYMPHOCYTE % 10 %    MONOCYTE % 3 %    EOSINOPHIL % 0 %    BASOPHIL % 0 %    NEUTROPHIL # 8.05 (H) 1.50 - 7.70 x10^3/uL    LYMPHOCYTE # 0.97 (L) 1.00 - 4.80  x10^3/uL    MONOCYTE # 0.29 0.20 - 1.10 x10^3/uL    EOSINOPHIL # <0.10 <=0.50 x10^3/uL    BASOPHIL # <0.10 <=0.20 x10^3/uL    IMMATURE GRANULOCYTE % 1 0 - 1 %    IMMATURE GRANULOCYTE # <0.10 <0.10 x10^3/uL   STREPTOCOCCUS PNEUMONIAE ANTIGEN,URINE   Result Value Ref Range    S.PNEUMONIA ANTIGEN Negative Negative, Indeterminate   LEGIONELLA URINE ANTIGEN   Result Value Ref Range    LEGIONELLA ANTIGEN Negative Negative, Indeterminate   AMMONIA   Result Value Ref Range    AMMONIA 38 15 - 50 umol/L   VENOUS BLOOD GAS/LACTATE   Result Value Ref Range    %FIO2 (VENOUS) 30.0 %    PH (VENOUS) 7.44 (H) 7.31 - 7.41    PCO2 (VENOUS) 49.00 41.00 - 51.00 mm/Hg    PO2 (VENOUS) 61.0 (H) 35.0 - 50.0 mm/Hg    BASE EXCESS 7.8 (H) -3.0 - 3.0 mmol/L    BICARBONATE (VENOUS) 30.9 (H) 22.0 - 26.0 mmol/L    LACTATE 1.1 0.0 - 1.3 mmol/L         Radiology Tests Ordered/ Reviewed (Please indicate ordered or reviewed)   Reviewed:  Results for orders placed or performed during the hospital encounter of 01/08/19 (from the past 24 hour(s))   XR AP MOBILE CHEST     Status: None    Narrative    [facility] 58[facility]    XR AP MOBILE CHEST performed on 01/08/2019 11:16 PM.    REASON FOR EXAM:  dyspnea, hypoxia    TECHNIQUE: 1 views/1 images submitted for interpretation.    COMPARISON: Plain radiograph performed January 08, 2019    FINDINGS: Cardiac silhouette is stable in size compared to prior exam.  There appears to be retrocardiac opacity with air bronchograms. Left  basilar atelectasis is demonstrated. Linear atelectasis is again  demonstrated on the right. No significant pleural effusion or pneumothorax  is identified.    Tracheostomy tube is demonstrated with tip projecting approximately 5 cm  above the carina.  Left PICC is demonstrated with tip projecting over the  expected location of  mid SVC.      Impression    Suspect retrocardiac atelectasis/consolidation.            Dimple Nanas, MD 01/09/2019 10:28          I saw and  examined the patient.  Late entry for 01/09/2019.  I reviewed the resident's note.  I agree with the findings and plan of care as documented in the resident's note.  Any exceptions/additions are edited/noted.  We are available to assist.      Rita Ohara, MD

## 2019-01-09 NOTE — Nurses Notes (Signed)
TPA aspirated from white port of Left arm PICC line.   10 Ml blood returned.  Line flushed with normal saline.  Line capped.

## 2019-01-09 NOTE — Care Plan (Addendum)
Patient admitted to MICU overnight from ED for seizure-like activity while at her nursing home. Patient trached to vent on arrival to MICU. Swabbed for COVID in ED; resulted negative. Given 2 loading doses of Keppra in ED and given several pushes of ativan. Antibiotics started. 2 sets of cultures obtained. GCS 4-1-1. Vegetative state at baseline, per family and ED RN. Patient clamps down on tongue when having seizure-like activity; service aware. MICU at bedside to assess. 1 L plasmalyte bolus given for low BP's. Neurosurgery consulted. Patient turned and repositioned Q2 hours. VS and assessments per flowsheets, will continue to monitor.   Gregor Hams, RN  01/09/2019, 04:52    Problem: Communication Impairment (Mechanical Ventilation, Invasive)  Goal: Effective Communication  Outcome: Ongoing (see interventions/notes)     Problem: Device-Related Complication Risk (Mechanical Ventilation, Invasive)  Goal: Optimal Device Function  Outcome: Ongoing (see interventions/notes)     Problem: Inability to Wean (Mechanical Ventilation, Invasive)  Goal: Mechanical Ventilation Liberation  Outcome: Ongoing (see interventions/notes)     Problem: Nutrition Impairment (Mechanical Ventilation, Invasive)  Goal: Optimal Nutrition Delivery  Outcome: Ongoing (see interventions/notes)     Problem: Skin and Tissue Injury (Mechanical Ventilation, Invasive)  Goal: Absence of Device-Related Skin and Tissue Injury  Outcome: Ongoing (see interventions/notes)     Problem: Ventilator-Induced Lung Injury (Mechanical Ventilation, Invasive)  Goal: Absence of Ventilator-Induced Lung Injury  Outcome: Ongoing (see interventions/notes)     Problem: Skin Injury Risk Increased  Goal: Skin Health and Integrity  Outcome: Ongoing (see interventions/notes)     Problem: Seizure, Active Management  Goal: Absence of Seizure/Seizure-Related Injury  Outcome: Ongoing (see interventions/notes)     Problem: COVID-19 Confirmed or Rule-Out  Description: Resident  requires specific care to avoid complications secondary to COVID-19  Goal: Resident will remain free of complications due to [id number]  Description: 1. Review and update the resident's isolation status in the Isolation activity  2. Keep the resident's door closed at all times and limit movement of the resident outside of the room to medically essential purposes. If applicable, transfer resident to a single-person room   3. Educate and reinforce infection prevention and control practices recommended by CDC  4. Frequently monitor for development of more severe symptoms   5. Use appropriate PPE when providing care for resident  6. Reinforce no visitor policy and non-essential health care personnel policy, except for certain compassionate care situations  7. If worsening of symptoms occur, alert the nearest Hospital caring for confirmed COVID-19 patients and arrange for transfer with proper precautions including placing a facemask on the resident during transfer  8. Communicate information about known or suspected case of COVID-19 to appropraite public health personnel  9. Avoid procedures that are likely to induce coughing (e.g., sputum induction, open suctioning of airways). If required, do so in a Airborne Infection Isolation Room. The health care provider in the room should wear an N95 or higher-level respirator, eye protection, gloves, and a gown. The number of HCP present during the procedure should be limited to only those essential for resident care and procedure support. Visitors should not be present for the procedure. Clean and disinfect procedure room surfaces promptly  10. Update resident, family, and resident representatives as needed      Outcome: Ongoing (see interventions/notes)

## 2019-01-09 NOTE — ED Nurses Note (Signed)
Pt found to be biting very hard on her tongue, providers called to the bedside, 4mg  IV ativan given in total, concern for active seizure.  After the ativan, pt's tongue was able to be placed back in mouth using tongue depressors.  Pt moved to ED room 14, plan to exchange trach with cuff and place on vent.

## 2019-01-09 NOTE — Consults (Signed)
[facility]  Neurology Initial Consult    [name], [name], 58 y.o. 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 The patient is trach and PEG dependent. Today she was found today at the nursing home clamping teeth down onto tongue and was not releasing. She was given Valium gel to finally get patient to release. No significant damage to tongue reported. She was then taken to the outside hospital where CT scan head did not show acute changes. The patient has cranial flap, is supposed to wear a helmet, but was left at nursing home.  She is unfortunately bedridden, non verbal and does not follow commands at baseline. The patient was transferred to our facility for concern for seizure activity. On arrival the patient was stable however, she was found to be biting very hard on her tongue and was given total of 27m IV ativan which helped relaxing her jaw and stabilizing the tongue inside the mouth using tongue depressors. The patient was also given 1500 mg of Keppra.   The patient's lab studies and imaging are suggestive of pneumonia and increased oxygen requirements therefore, the patient was transferred to the MICU for further management.     Admission Source:  [place] hospital -  [place]  None-Discussed  Hospice involvement prior to admission?  Not applicable    Location (of pain): Quality (character  of pain) Severity (minimal, mild, severe, scale or 1-10) Duration (how long has pain/sx present) Timing (when does pain/sx occur)  Context (activity at/before onset) Modifying Factors (what makes pain/sx  Better/worse) Associate Sign/Sx (what accompanies main pain/sx)    Past Medical History:   Diagnosis Date   . Anxiety    . Arthropathy    . Cancer (CMS HCC)     brain tumor   . Dyspnea on exertion    . Esophageal reflux     does not take meds   . Heart murmur     benign   . HTN (hypertension)    . Hx MRSA infection    . Hyperlipemia    . Muscle weakness     right sided weakness   . Palpitations    . Respiratory failure (CMS HCC)    . Shortness of breath    . Wears glasses     reading         Past Surgical History:   Procedure Laterality Date   . HX APPENDECTOMY     . HX BREAST AUGMENTATION Bilateral    . HX LAP CHOLECYSTECTOMY     . HX LUMBAR DISKECTOMY  1999   . HX OTHER Right     benign tumor removed from upper right leg x3   . HX OTHER      exploratory lap   . HX TUBAL LIGATION     . TRACHEOSTOMY           Medications Prior to Admission  Prescriptions    acetaminophen (TYLENOL) 325 mg Oral Tablet    Take 2 Tabs (650 mg total) by mouth Every 6 hours as needed for Pain    Patient taking differently:  650 mg by Gastric (NG, OG, PEG, GT) route Every 4 hours as needed for Pain     amLODIPine (NORVASC) 10 mg Oral Tablet    Take 10 mg by mouth Once a day G tube    apixaban (ELIQUIS) 2.5 mg Oral Tablet    Take 2.5 mg by mouth Twice daily G tube    arginine/glutamine/calcium bmb (JUVEN ORAL)    Take by mouth Once a day    ascorbic acid, vitamin C, (VITAMIN C) 500 mg Oral Tablet    1 Tab (500 mg total) by Gastric (NG, OG, PEG, GT) route Twice daily    aspirin 325 mg Oral Tablet    1 Tab (325 mg total) by Gastric (NG, OG, PEG, GT) route Once a day    chlorhexidine gluconate (PERIDEX) 0.12 % Mucous Membrane Mouthwash    15 mL swish and spit Twice daily    cloBAZam (ONFI) 2.5 mg/mL Oral Suspension    8 mL (20 mg total) by  Gastric (NG, OG, PEG, GT) route Every 8 hours    diazePAM (DIASTAT ACUDIAL) 5-7.5-10 mg Rectal Kit    10 mg by Rectal route Every 6 hours as needed    diazePAM (VALIUM) 5 mg Oral Tablet    Take 5 mg by mouth Twice daily G tube    fluconazole (DIFLUCAN) 150 mg Oral Tablet    Take 150 mg by mouth Every 7 days On Friday by g tube    ipratropium/albuterol sulfate (IPRATROPIUM-ALBUTEROL INHL)    Take by inhalation Every 4 hrs as needed for sob    lacosamide (VIMPAT) 200 mg Oral Tablet    1 Tab (200 mg total) by Gastric (NG, OG, PEG, GT) route Twice daily    levETIRAcetam (KEPPRA) 750 mg Oral Tablet    2 Tabs (1,500 mg total) by Gastric (NG, OG, PEG, GT) route Twice daily    lisinopriL (PRINIVIL) 40 mg Oral Tablet    Take 40 mg by mouth Once a day    metoprolol tartrate (LOPRESSOR) 25 mg Oral Tablet    Take 100 mg by mouth Twice daily     phenytoin (DILANTIN) 100 mg/4 mL Oral Suspension    4 mL (100 mg total) by Gastric (NG, OG, PEG, GT) route Every 8 hours    Patient taking differently:  150 mg by Gastric (NG, OG, PEG, GT) route Every 8 hours         No Known Allergies  Social History     Tobacco Use   . Smoking status: Current Every Day Smoker     Packs/day: 1.00     Years: 30.00     Pack years: 30.00     Types: Cigarettes   . Smokeless tobacco: Never Used   . Tobacco comment: down to 8 cigarettes, counseled on 1-800-QUIT_NOW   Substance Use Topics   . Alcohol use: Never     Frequency: Never     Family Medical History:     Problem Relation (Age of Onset)    Ehlers-Danlos syndrome Daughter    Heart Attack Mother    Lymphoma Mother              ROS: Unable to asess due to AMS    Exam:  Temperature: 36.4 C (97.5 F)  Heart  Rate: 89  BP (Non-Invasive): (!) 95/52  Respiratory Rate: 13  SpO2: 94 %  General: appears chronically ill  QZE:SPQZ without erythema or injection, mucous membranes moist.  Neck: no thyromegaly or lymphadenopathy  Carotids:Carotids normal without bruit  Respiratory: Clear to auscultation bilaterally.      Cardiovascular: regular rate and rhythm  Gastrointestinal: Soft, non-tender  Genitourinary: Deferred  Lymphatic/Immunologic/Hematologic: No lymphadenopathy  Psychiatric: Unable to assess due to vegetative state   Musculoskeletal: Left temporal swelling (5 cm diameter)  Ophthalomscopic: Poorly visualized fundus in a non dilated exam   Glasgow:  Eye opening:  4 spontaneous, Verbal response:  1 no response, Best motor response:  5 moved to localized pain  Mental status: unable to assess due to unresponsiveness   Level of Consciousness: alert  Orientations:   Memory unable to assess due to unresponsiveness   Attentions unable to assess due to unresponsiveness   Knowledge:  unable to assess due to unresponsiveness   Language:  unable to assess due to unresponsiveness   Speech:  unable to assess due to unresponsiveness   Cranial nerves:   CN2: Visual acuity and fields intact and Right visual field deficit:  upper and lower  CN 3,4,6: Pupils: equal and sluggish and ophthalmoparesis:   right  CN 5Corneal reflex:  Normal  CN 7Face symmetrical  CN 8: unable to assess due to unresponsiveness   CN 9,10: Palate symmetric and gag normal and Gag reflex:  normal  CN 11:  unable to assess due to unresponsiveness   CN 12: protruded tongue, no fasciculations   Gait, Coordination, and Reflexes:   Gait: bedridden  Coordination:  unable to assess due to unresponsiveness     Muscle tone: flaccid  Muscle exam: withdrawing to pain in all 4 extremities     Reflexes:   RJ BJ TJ KJ AJ Plantars Hoffman's   Right 4+ 4+ 4+ 3+ 3+ Upgoing present   Left 2+ 2+ 2+ 2+ 2+ Downgoing Not present     Sensory: Withdrawing to tactile stimuli throughout   Integumentary: Skin warm and dry  Diabetes Monitors:   FOOTEXAM: Both feet without edema or ulcerations. Pulses normal bilaterally. Sensation normal bilaterally      Labs:    I have reviewed all lab results.  Lab Results Today:    Results for orders placed or performed during the hospital encounter of  01/08/19 (from the past 24 hour(s))   BASIC METABOLIC PANEL   Result Value Ref Range    SODIUM 141 136 - 145 mmol/L    POTASSIUM 3.1 (L) 3.5 - 5.1 mmol/L    CHLORIDE 97 96 - 111 mmol/L    CO2 TOTAL 33 (H) 22 - 32 mmol/L    ANION GAP 11 4 - 13 mmol/L    CALCIUM 10.1 8.5 - 10.2 mg/dL    GLUCOSE 112 65 - 139 mg/dL    BUN 27 (H) 8 - 25 mg/dL    CREATININE 0.51 0.49 - 1.10 mg/dL    BUN/CREA RATIO 53 (H) 6 - 22    ESTIMATED GFR >60 >60 mL/min/1.17m2   PHOSPHORUS   Result Value Ref Range    PHOSPHORUS 4.3 2.4 - 4.7 mg/dL   MAGNESIUM   Result Value Ref Range    MAGNESIUM 1.8 1.6 - 2.6 mg/dL   TROPONIN-I (FOR ED ONLY)   Result Value Ref Range    TROPONIN I 19 0 - 30 ng/L   PHENYTOIN (Dilantin) LEVEL   Result Value Ref Range  PHENYTOIN LEVEL 25.2 (H) 10.0 - 20.0 ug/mL   CBC WITH DIFF   Result Value Ref Range    WBC 14.4 (H) 3.7 - 11.0 x10^3/uL    RBC 4.11 3.85 - 5.22 x10^6/uL    HGB 11.9 11.5 - 16.0 g/dL    HCT 36.3 34.8 - 46.0 %    MCV 88.3 78.0 - 100.0 fL    MCH 29.0 26.0 - 32.0 pg    MCHC 32.8 31.0 - 35.5 g/dL    RDW-CV 14.8 11.5 - 15.5 %    PLATELETS 366 150 - 400 x10^3/uL    MPV 9.7 8.7 - 12.5 fL    NEUTROPHIL % 76 %    LYMPHOCYTE % 13 %    MONOCYTE % 7 %    EOSINOPHIL % 3 %    BASOPHIL % 0 %    NEUTROPHIL # 11.06 (H) 1.50 - 7.70 x10^3/uL    LYMPHOCYTE # 1.82 1.00 - 4.80 x10^3/uL    MONOCYTE # 1.04 0.20 - 1.10 x10^3/uL    EOSINOPHIL # 0.36 <=0.50 x10^3/uL    BASOPHIL # <0.10 <=0.20 x10^3/uL    IMMATURE GRANULOCYTE % 1 0 - 1 %    IMMATURE GRANULOCYTE # <0.10 <0.10 x10^3/uL   VENOUS BLOOD GAS/LACTATE   Result Value Ref Range    %FIO2 (VENOUS) 100.0 %    PH (VENOUS) 7.44 (H) 7.31 - 7.41    PCO2 (VENOUS) 53.00 (H) 41.00 - 51.00 mm/Hg    PO2 (VENOUS) 52.0 (H) 35.0 - 50.0 mm/Hg    BASE EXCESS 9.9 (H) -3.0 - 3.0 mmol/L    BICARBONATE (VENOUS) 32.4 (H) 22.0 - 26.0 mmol/L    LACTATE 1.1 0.0 - 1.3 mmol/L   COVID-19 SCREENING - Admitted or Likely to be Admitted   Result Value Ref Range    SARS-CoV-2 Not Detected Not Detected      URINALYSIS, MACROSCOPIC   Result Value Ref Range    SPECIFIC GRAVITY 1.018 1.005 - 1.030    GLUCOSE Negative Negative mg/dL    PROTEIN Negative Negative mg/dL    BILIRUBIN Negative Negative mg/dL    UROBILINOGEN Negative Negative mg/dL    PH 5.0 5.0 - 8.0    BLOOD Negative Negative mg/dL    KETONES Negative Negative mg/dL    NITRITE Negative Negative    LEUKOCYTES Small (A) Negative WBCs/uL    APPEARANCE Cloudy (A) Clear    COLOR Normal (Yellow) Normal (Yellow)   URINALYSIS, MICROSCOPIC   Result Value Ref Range    WBCS 8.0 <11.0 /hpf    RBCS 0.0 <6.0 /hpf    BACTERIA Several (A) Occasional or less /hpf    HYALINE CASTS 9.0 (H) <4.0 /lpf    SQUAMOUS EPITHELIAL CELLS Occasional or less Occasional or less /lpf    MUCOUS Light Light /lpf       Review of reports and notes reveal:   vEEG 07/31/18   The EEG described above is suggestive of areas of bilateral epileptogenic potential.  The left hemisphere seems to be particularly prone to epileptogenic potential, but no clear ictal activity is noted.    Independent Interpretation of images or specimens:  CT head wo IV contrast   Post operative changes on the left which is associated with interval increase in brain herniation through the craniotomy with secondary ventricular dilation. No hemorrhage or other acute process.      Assessment/Plan:  Active Hospital Problems    Diagnosis   . Primary Problem: Seizure (CMS [place])   .  Vegetative state (CMS [place])   . CAP (community acquired pneumonia)     [name] a 58 y.o.,Whitefemalewith history of left tentorial meningioma and a filling deficit in the left transverse sinus concerning for chronic venous thrombusnow s/p lefttemporal craniotomy for resection of left tentorial meningioma (07/06/18),complicated by tumor bed hemorrhage s/p L fronto-temporal craniectomy with evacuation.  Neurology is consulted for evaluation of concern for status epilepticus in a patient with jaw clenching episodes in the setting of  possible pneumonia   She is currently on multiple of AEDs that were started for multiple clinical seizures and subclinical seizures on vEEG.    Spell-Tongue biting  - Recommend Q4H Neuro checks  - Recommend seizure precautions   - Recommend vEEG with history of seizure and concern for breakthrough status epilepticus at this time in the setting of pneumonia, apparently patient is not at her baseline given these spells, therefore, vEEG is indicated to characterize spell  - Agree with AED levels Phenytoin, Levetiracetam and Lacosamide  - Recommend keeping AEDs as prescribed  -- levetiracetam (Keppra)1500 mg 2x/day   -- Lacosamide (Vimpat)200 mg 2x/day   -- phenytoin (Dilantin)100 mg q8h   -- Clobazam (Onfi)20 mg q8h                -- The patient has also been given Dizepam 5 mg BID    - Please continue medical management of underlying medical issues that can serve as provoking factors for the patient's known epilepsy   - Please avoid medications that can lower seizure threshold such as Wellbutrin, Ultram, Lithium, Lidocaine IV, Quinolones, Imipenem or Cefepime         DNR Status this admission:  Full Code  Palliative/Supportive Care consulted?  no  Hospice Consulted?  Not applicable    Current Comorbid Conditions - Neurology Consult  Compression of the Brain:  no  Coma (GCS less than 8):Coma -Not applicable  TIA not applicable  Encephalopathy:  yes  Encephalitis-Not applicable  Seizure-Unspecified   Respiratory Failure/Other-Not applicable  No  Coagulopathy Not applicable  N/A    DVT/PE Prophylaxis: Per primary team    Nunzio Cory, MD      I saw and examined the patient.  I reviewed the resident's note.  I agree with the findings and plan of care as documented in the resident's note.  Any exceptions/additions are edited/noted.    Valora Corporal, MD

## 2019-01-09 NOTE — Progress Notes (Signed)
Interval Progress Note:    58 y.o F with persistent severe neurologic deficits currently admitted with concerns for seizure and HCAP    Suspected Seizure in the setting of severe neurologic deficits:  - patient has a hx of L tentorial meningioma s/p resection in 52/7782 complicated by hematoma and repeat OR intervention  - Seizure-like activity terminated in ED with 7mg  Ativan.  Was also given Keppra and Dexamethasone.   - CT head with increased herniation through craniotomy site.  - Neurosurgery consulted, appreciate assistance.  Per assessment, has developed hydrocephalus. Will need MRI brain w/wo contrast  - NCCU consulted, appreciate assistance                - Holding Phenytoin 100 q8 due to increased level,  continue Keppra 1500 BID, Vimpat 200 BID, Clobazam 20 q8H                - Vimpat level, phenytoin, Keppra levels pending  - q4 neuro checks ordered  - vEEG ordered.   - Supportive care consulted for additional goals of care clarification and treatment limitations. Will consider LP after discussion.    Healthcare Associated Pneumonia:  - CXR with possible retrocardiac PNA vs atelectasis.  There was noted more secretions/pus from trach site.    - received 2L NS and albumin bolus  - COVID negative. MRSA positive  - urine Strep/Legionella negative, Procal, Blood Cultures pending  - continue vancomycin and zosyn  - Duoneb QID scheduled.  Pulm toilet  - will monitor urine output and trend lactate Q6H to assess shock while goal clarification is pending.    Hypokalemia  - repleting PO  - repeat in afternoon    Prophylaxis:                DVT/PE:  Heparin                GI: H2 blocker                Bowels:  Miralax/senna                Skin:  routine ppx   Analgesia: none  Sedation: none  Pressors:none  Antibiotics: Vanc/Zosyn  Nutrition:  NPO   Therapy: PT/OT   Activity status:  Bedrest    Joyice Faster, M.D.  Internal Medicine PGY-2  01/09/2019 - 11:17    I saw and examined the patient with the resident. I  have reviewed Dr.Khan'snote. I agree with the findings and plan of care as documented in the  note. Any exceptions/additions are noted above.    For plan and CM see H&P    Oley Balm ,MD  Assistant Professor,Section of Pulmonary ,Critical Care and Sleep Medicine  Department of Medicine, [facility]

## 2019-01-09 NOTE — Consults (Signed)
[facility]  Neurosurgery Consult  Initial Consult Note      [name], [name], 58 y.o. female  Date of Admission:  01/08/2019  Date of Service: 01/09/2019  Date of Birth:  15-Sep-1960    Primary Service: MICU2  Requesting Faculty: Rod Holler  Reason for Consult: seizure like activity      Information Obtained from: history reviewed via medical record  Chief Complaint: tongue biting    HPI:  Aften [name] is a 58 y.o., White female with PMH L temporal crani for L tentorial meningioma + L [facility] for evacuation of hematoma in tumor bed (07/06/18, Margart Sickles). She presented to an outside ED from the [facility] where she has resided since being discharged from [facility] in January. Per report, she was noted to have been biting her tongue which precipitated concern for seizure like activity which prompted transfer to the outside ED and subsequent transfer to [facility]. Of note, CT brain was obtained at the outside facility which demonstrated interval increase in transcalvarial herniation for which neurosurgery was consulted.      ROS: (MUST comment on all "Abnormal" findings)  Other than ROS in the HPI, all other systems were negative.    PAST MEDICAL/ FAMILY/ SOCIAL HISTORY:   Past Medical History:   Diagnosis Date   . Anxiety    . Arthropathy    . Cancer (CMS HCC)     brain tumor   . Dyspnea on exertion    . Esophageal reflux     does not take meds   . Heart murmur     benign   . HTN (hypertension)    . Hx MRSA infection    . Hyperlipemia    . Muscle weakness     right sided weakness   . Palpitations    . Respiratory failure (CMS HCC)    . Shortness of breath    . Wears glasses     reading         Medications Prior to Admission     Prescriptions    acetaminophen (TYLENOL) 325 mg Oral Tablet    Take 2 Tabs (650 mg total) by mouth Every 6 hours as needed for Pain    Patient taking differently:  650 mg by Gastric (NG, OG, PEG, GT) route Every 4 hours as needed for Pain     amLODIPine (NORVASC) 10 mg Oral Tablet    Take 10 mg by  mouth Once a day G tube    apixaban (ELIQUIS) 2.5 mg Oral Tablet    Take 2.5 mg by mouth Twice daily G tube    arginine/glutamine/calcium bmb (JUVEN ORAL)    Take by mouth Once a day    ascorbic acid, vitamin C, (VITAMIN C) 500 mg Oral Tablet    1 Tab (500 mg total) by Gastric (NG, OG, PEG, GT) route Twice daily    aspirin 325 mg Oral Tablet    1 Tab (325 mg total) by Gastric (NG, OG, PEG, GT) route Once a day    chlorhexidine gluconate (PERIDEX) 0.12 % Mucous Membrane Mouthwash    15 mL swish and spit Twice daily    cloBAZam (ONFI) 2.5 mg/mL Oral Suspension    8 mL (20 mg total) by Gastric (NG, OG, PEG, GT) route Every 8 hours    diazePAM (DIASTAT ACUDIAL) 5-7.5-10 mg Rectal Kit    10 mg by Rectal route Every 6 hours as needed    diazePAM (VALIUM) 5 mg Oral Tablet    Take 5  mg by mouth Twice daily G tube    fluconazole (DIFLUCAN) 150 mg Oral Tablet    Take 150 mg by mouth Every 7 days On Friday by g tube    ipratropium/albuterol sulfate (IPRATROPIUM-ALBUTEROL INHL)    Take by inhalation Every 4 hrs as needed for sob    lacosamide (VIMPAT) 200 mg Oral Tablet    1 Tab (200 mg total) by Gastric (NG, OG, PEG, GT) route Twice daily    levETIRAcetam (KEPPRA) 750 mg Oral Tablet    2 Tabs (1,500 mg total) by Gastric (NG, OG, PEG, GT) route Twice daily    lisinopriL (PRINIVIL) 40 mg Oral Tablet    Take 40 mg by mouth Once a day    metoprolol tartrate (LOPRESSOR) 25 mg Oral Tablet    Take 100 mg by mouth Twice daily     phenytoin (DILANTIN) 100 mg/4 mL Oral Suspension    4 mL (100 mg total) by Gastric (NG, OG, PEG, GT) route Every 8 hours    Patient taking differently:  150 mg by Gastric (NG, OG, PEG, GT) route Every 8 hours          cefepime (MAXIPIME) 2 g in NS 100 mL IVPB, 2 g, Intravenous, Now  cefepime (MAXIPIME) 2 g in NS 100 mL IVPB, 2 g, Intravenous, Q8H  LORazepam (ATIVAN) 2 mg/mL injection ---Cabinet Override, , ,   LORazepam (ATIVAN) 2 mg/mL injection ---Cabinet Override, , ,   NS flush syringe, 2 mL,  Intracatheter, Q8HRS    And  NS flush syringe, 2-6 mL, Intracatheter, Q1 MIN PRN  vancomycin (VANCOCIN) 1,250 mg in NS 250 mL IVPB, 18 mg/kg (Adjusted), Intravenous, Q12H  vancomycin (VANCOCIN) 1,500 mg in NS 500 mL IVPB, 20 mg/kg (Adjusted), Intravenous, Now  Vancomycin IV - Pharmacist to Dose per Protocol, , Does not apply, Daily PRN      No Known Allergies  Past Surgical History:   Procedure Laterality Date   . HX APPENDECTOMY     . HX BREAST AUGMENTATION Bilateral    . HX LAP CHOLECYSTECTOMY     . HX LUMBAR DISKECTOMY  1999   . HX OTHER Right     benign tumor removed from upper right leg x3   . HX OTHER      exploratory lap   . HX TUBAL LIGATION     . TRACHEOSTOMY           Social History     Tobacco Use   . Smoking status: Current Every Day Smoker     Packs/day: 1.00     Years: 30.00     Pack years: 30.00     Types: Cigarettes   . Smokeless tobacco: Never Used   . Tobacco comment: down to 8 cigarettes, counseled on 1-800-QUIT_NOW   Substance Use Topics   . Alcohol use: Never     Frequency: Never   . Drug use: Never       Past Family History:   Family Medical History:     Problem Relation (Age of Onset)    Ehlers-Danlos syndrome Daughter    Heart Attack Mother    Lymphoma Mother          PHYSICAL EXAMINATION: (MUST comment on all "Abnormal" findings)    Glascow coma scale:Eye opening:  4 spontaneous, Verbal response:  2 incomprehensible sounds, Best motor response:  4 flexion withdrawal    Constitutional  Temperature: 36.4 C (97.5 F)  Heart Rate: 89  BP (Non-Invasive): Marland Kitchen)  95/52  Respiratory Rate: 13  SpO2: 94 %  Appears stated age, NAD  A&Ox0  GCS '4 4 2 ' trach  No speech  No fund of knowledge  No attention span & concentration  No recent and remote memory  Sclera white  Trachea midline  Regular respirations  Skin will perfused  Mouth symmetric  CN 2 PERRL  CN '3 4 6 ' EOM conjugate  CN 5 facial sensation UTA  CN 7 Face symmetric  CN 8 Hearing UTA  CN 11 shrug UTA  CN 12 Tongue midline  Muscle Strength   no  movement BUE, WD RLE, no movement LLE  Muscle tone WNL    Impression/Recommendations  Cassi Jenne is a 58 y.o. female with PMH L temporal crani for L tentorial meningioma + L [facility] for evacuation of hematoma in tumor bed (07/06/18, Margart Sickles) presenting from [facility] with seizure like activity. HD1.  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 Will decide after she has has a MRI brain with and without contrast.  I saw and examined the patient.  I reviewed the resident's note.  I agree with the findings and plan of care as documented in the resident's note.  Any exceptions/additions are edited/noted.    [name], MD

## 2019-01-09 NOTE — H&P (Signed)
[facility]  MICU  Admission H&P    Date of Service:  01/09/2019  [name], 58 y.o. female  Date of Admission:  01/08/2019  Date of Birth:  1961/05/27  PCP: Isac Caddy, MD    Information Obtained from: history reviewed via medical record    Chief Complaint:  concern for seizure with tongue biting    HPI:  [name] is a 58 y.o., White female who presents as transfer from [facility] ED to [facility] ED with above complaint(s).  She was noted to be clamping down onto her tongue and concern was for seizure.  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  During that prolonged admission from 07/05/18-08/07/18 she ended up with trach/peg and was sent to [facility].  According to telephone documentation from as far back as Jan/Feb or 2020 she has been nonresponsive from neurologic standpoint.  CT head at outside negative or major acute process.  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    Med rec unable to be completed.        PAST MEDICAL:    Past Medical History:   Diagnosis Date   . Anxiety    . Arthropathy    . Cancer (CMS HCC)     brain tumor   . CAP (community acquired pneumonia) 01/09/2019   . Dyspnea on exertion    . Esophageal reflux     does not take meds   . Heart murmur     benign   . HTN (hypertension)    . Hx MRSA infection    . Hyperlipemia    . Muscle weakness     right sided weakness   . Palpitations    . Respiratory failure (CMS HCC)    . Seizure (CMS [place]) 01/09/2019   . Shortness of breath    . Wears glasses     reading        Past Surgical History:   Procedure Laterality Date   . HX APPENDECTOMY     . HX BREAST AUGMENTATION Bilateral    . HX LAP CHOLECYSTECTOMY     . HX LUMBAR DISKECTOMY  1999   . HX OTHER Right     benign tumor removed from upper right leg x3   . HX OTHER      exploratory lap   . HX TUBAL LIGATION      . TRACHEOSTOMY              Medications Prior to Admission     Prescriptions    acetaminophen (TYLENOL) 325 mg Oral Tablet    Take 2 Tabs (650 mg total) by mouth Every 6 hours as needed for Pain    Patient taking differently:  650 mg by Gastric (NG, OG, PEG, GT) route Every 4 hours as needed for Pain     amLODIPine (NORVASC) 10 mg Oral Tablet    Take 10 mg by mouth Once a day G tube    apixaban (ELIQUIS) 2.5 mg Oral Tablet    Take 2.5 mg by mouth Twice daily G tube    arginine/glutamine/calcium bmb (JUVEN ORAL)    Take by mouth Once a day    ascorbic acid, vitamin C, (VITAMIN C) 500 mg Oral Tablet    1 Tab (500 mg total) by Gastric (NG, OG, PEG, GT) route Twice daily  aspirin 325 mg Oral Tablet    1 Tab (325 mg total) by Gastric (NG, OG, PEG, GT) route Once a day    chlorhexidine gluconate (PERIDEX) 0.12 % Mucous Membrane Mouthwash    15 mL swish and spit Twice daily    cloBAZam (ONFI) 2.5 mg/mL Oral Suspension    8 mL (20 mg total) by Gastric (NG, OG, PEG, GT) route Every 8 hours    diazePAM (DIASTAT ACUDIAL) 5-7.5-10 mg Rectal Kit    10 mg by Rectal route Every 6 hours as needed    diazePAM (VALIUM) 5 mg Oral Tablet    Take 5 mg by mouth Twice daily G tube    fluconazole (DIFLUCAN) 150 mg Oral Tablet    Take 150 mg by mouth Every 7 days On Friday by g tube    ipratropium/albuterol sulfate (IPRATROPIUM-ALBUTEROL INHL)    Take by inhalation Every 4 hrs as needed for sob    lacosamide (VIMPAT) 200 mg Oral Tablet    1 Tab (200 mg total) by Gastric (NG, OG, PEG, GT) route Twice daily    levETIRAcetam (KEPPRA) 750 mg Oral Tablet    2 Tabs (1,500 mg total) by Gastric (NG, OG, PEG, GT) route Twice daily    lisinopriL (PRINIVIL) 40 mg Oral Tablet    Take 40 mg by mouth Once a day    metoprolol tartrate (LOPRESSOR) 25 mg Oral Tablet    Take 100 mg by mouth Twice daily     phenytoin (DILANTIN) 100 mg/4 mL Oral Suspension    4 mL (100 mg total) by Gastric (NG, OG, PEG, GT) route Every 8 hours    Patient taking  differently:  150 mg by Gastric (NG, OG, PEG, GT) route Every 8 hours         No allergies listed in chart.  patient unable to provide history herself.      Review of Systems:  unable to obtain as patient is nonverbal      Family History  Family Medical History:     Problem Relation (Age of Onset)    Ehlers-Danlos syndrome Daughter    Heart Attack Mother    Lymphoma Mother          Social History  Social History     Tobacco Use   . Smoking status: Current Every Day Smoker     Packs/day: 1.00     Years: 30.00     Pack years: 30.00     Types: Cigarettes   . Smokeless tobacco: Never Used   . Tobacco comment: down to 8 cigarettes, counseled on 1-800-QUIT_NOW   Substance Use Topics   . Alcohol use: Never     Frequency: Never          Physical Exam:  GENERAL:  chronically ill-appearing  HEAD: defect on L side where flap present  EYES: PERRL, sclera nonicteric  EARS: no gross discharge noted  NOSE: No nasal discharge   MOUTH: blood in mouth from bite on tongue  NECK: no gross JVD or adenopathy present, trach present with yellowish secretions  CARDIAC: rrr on monitor, nml heart sounds, no m/r/g appreciated   LUNGS: clear to ausculation bilaterally with some suspected transmitted breath sounds  ABDOMEN: +BS, soft, nondistended, nontender to palpation   MSK: will move all 4 extremities spontaneously  EXTREMITIES: no significant deformity, no joint abnormality, no pitting edema, warm  NEURO: not responding to commands, withdraws  SKIN: appropriate color and turgor without notable rashes, lesions, or eruptions  PSYCH: unable to assess      Temperature: 36.4 C (97.5 F) Heart Rate: 91 BP (Non-Invasive): (!) 88/56   Respiratory Rate: 14 SpO2: 97 %       Wt Readings from Last 10 Encounters:   01/09/19 76.2 kg (167 lb 15.9 oz)   01/08/19 84.4 kg (186 lb)   08/03/18 90.6 kg (199 lb 11.8 oz)   07/18/18 93.9 kg (207 lb)   06/25/18 78.5 kg (173 lb 1 oz)   06/11/18 79.6 kg (175 lb 7.8 oz)   06/08/18 77.1 kg (170 lb)   05/21/18 78 kg  (171 lb 15.3 oz)   03/29/18 77.1 kg (170 lb)   ]    Labs:    I have reviewed all admission labs.      Imaging Studies:    I have reviewed all admission imaging.      Code Status:    Full Code      Assessment/Plan:     Active Hospital Problems    Diagnosis   . Primary Problem: Seizure (CMS [place])   . Vegetative state (CMS [place])   . CAP (community acquired pneumonia)       58 yo F with persistent severe neurologic deficits admitted with concerns for seizure, and now intubated in ICU with concerns for PNA.    Needs accurate med rec.  Was not able to contact daughter.      Seizure  Persistent vegetative state  - Terminated in ED with 81m Ativan.  Was also given Keppra and Dexamethasone.   - CT head with increased herniation through craniotomy site  - Neurosurgery consulted, appreciate assistance  - NCCU consulted, appreciate assistance   - Phenytoin 100 q8, Keppra 1500 BID, Vimpat 200 BID, Clobazam 20 q8H   - Vimpat level, phenytoin, Keppra levels pending  - q4 neuro checks ordered  - vEEG ordered    CAP  - CXR with possible retrocardiac PNA vs atelectasis.  There was noted more secretions/pus from trach site.    - for some softer pressures given 1L IVF  - COVID negative  - urine Strep/Legionella, Procal, Blood Cultures ordered  - Vanc/Zosyn, can de-escalate as able  - Duoneb QID scheduled.  Pulm toilet    Hypokalemia  - repleting PO  - repeat in afternoon      Prophylaxis:   DVT/PE:  Heparin   GI: H2 blocker   Bowels:  Miralax/senna   Skin:  routine ppx   Analgesia: none  Sedation: none  Pressors:none  Antibiotics: Vanc/Zosyn  Nutrition:  NPO   Therapy: PT/OT   Activity status:  [place] DO, PGY-2  [phone]   Staff addendum:  58year old female with 6 months of tarch PEG LTACH s/p meningioma resection in Dec 2019. Poor neurological recovery since. Admitted last night with ongoing seizures. She is currently on 4 AEDs, hooked upto EEG. MRI planned for tonight. Doe shave low grade fevers, might do an LP.  Overall prognosis is guarded . Was hypotensive on am rounds thought to be likely from her high dose seizure meds, will continue with Vanc zosyn ( cultures pending) and start Levo if MAP persistently below 60 mmHg. Will involve supportive care given her poor clinical condition since December and likely a poor overall outcome.    Critical Care Time:  Total critical care time spent in direct care of this patient at high risk based on presenting history/exam/and complaint, including initial evaluation  and stabilization, review of data, re-examination, discussion with admitting and consulting services to arrange definitive care, and exclusive of any procedures performed, was 45 minutes.    Oley Balm ,MD  Assistant Professor,Section of Pulmonary ,Critical Care and Sleep Medicine  Department of Medicine, [facility]

## 2019-01-09 NOTE — Anesthesia Preprocedure Evaluation (Signed)
ANESTHESIA PRE-OP EVALUATION  Planned Procedure: INSERTION SHUNT VENTRICULAR PERITONEAL WITH NAVIGATION (N/A )  CT SCAN (N/A )  Review of Systems         patient summary reviewed  nursing notes reviewed        Pulmonary   pneumonia and Smoking history,   Cardiovascular    Hypertension and ECG reviewed ,       GI/Hepatic/Renal    GERD     Endo/Other    drug induced coagulopathy,       Neuro/Psych/MS    seizures and poorly controlled     Cancer  CA,                    Physical Assessment      Patient summary reviewed and Nursing notes reviewed   Airway     Comment: Tracheostomy in place              No endotracheal tube present  Tracheostomy present    Dental                    Pulmonary    Breath sounds clear to auscultation       Cardiovascular    Rhythm: regular  Rate: Normal       Other findings            Plan  ASA 4     Planned anesthesia type: general            Additional Plans: Arterial line  Intravenous induction                                     Cardiac Hx :AFib  Pulmonary Hx :Smoker  Functional Status  :Unable to assess  GERD Hx :No noted history  Anesthesia Hx :No noted history  NPO status :To be assessed morning of surgery  Allergies :NKDA    ECG :01/08/2019  ECHO : 07/12/2018  1. Normal left ventricular size. There is hyperdynamic left ventricular systolic function. LV Ejection  Fraction is 75 %. Eccentric hypertrophy.  2. Resting Segmental Wall Motion Analysis: Total wall motion score is 1.00. There are no regional wall  motion abnormalities.  3. Normal right ventricular size. Normal right ventricular systolic function.  CXR :01/08/2019   Bibasilar atelectasis.  Other Testing Brain CT 01/08/2019: Post operative changes on the left which is associated with interval increase in brain herniation through the craniotomy with secondary ventricular dilation. No hemorrhage or other acute process.    Labs reviewed.  Hgb 9.6   Plt 301   K 3.0   Cr 0.63   T/S sent (01/09/2019)    The risks of general anesthesia  including but not limited to perioperative MI/CVA/death, post-op intubation/ventilation, line placement, blood loss requiring transfusion, damage to teeth/lips/gums, throat pain, PONV, post-op pain were discussed with the patient. All questions/concerns regarding general anesthesia were addressed prior to proceeding with surgery. Consent was obtained.

## 2019-01-09 NOTE — Care Plan (Signed)
VENTILATOR - SIMV(PRVC)PS / APV-SIMV CONTINUOUS    Discontinue  Reschedule   Duration: Until Specified    Priority: Routine       Question Answer Comment   FIO2 (%) 30    Peep(cm/H2O) 5    Rate(bpm) 12    Tidal Volume(mls) 400    Pressure Support(cm/H2O) 8    Indications IMPROVE DISTRIBUTION OF VENTILATION           Patient is on the above settings, RT attempted to wean to CPAP/PS, patient did not tolerate and went apenic. Patient is currently resting on the above settings, respiratory to manage and follow.

## 2019-01-09 NOTE — Nurses Notes (Addendum)
01/09/19 0345   Vital Signs   BP (Non-Invasive) (!) 78/40   MAP (Non-Invasive) 52 mmHG       MICU resident notified of low BP. 1 L bolus plasmalyte given. See MAR. Will continue to monitor.   Gregor Hams, RN  01/09/2019, 04:14

## 2019-01-09 NOTE — ED Nurses Note (Signed)
Verbal report given to receiving RN.

## 2019-01-09 NOTE — Consults (Signed)
[name]    Date of Service:  01/09/2019  Requesting MD: Rod Holler MD   Reason for Consult:  Goals of care clarification and Communication with pt/family  Assessment & Recommendations:     Encounter for Palliative Care for Adult failure to thrive secondary to altered mental status from hydrocephalus, seizures, and h/o post-operative intracranial hematoma and s/p trach and gastrostomy tube.      1.  Recommend: continue current treatment plan without limitations  2.  Family will discuss together regarding CPR and further treatment goals  3.  MPOA daughter requesting information regarding prognosis and expected recovery from neurology/neurosurgical services  4.  Supportive Care will continue to follow      Decision Making Capacity:  No    Advance Directives prior to consultation:  MPOA- Yes, Name: [name]     and Phone Number [phone]    Patient/Family Meeting held, present were: Mal Misty, MD    Discussion with patient and family included: diagnoses, current problems, treatment limitations, treatment options and pt/family goals for care    Location of pt/family meeting:  telephone conference due to [place] vistation restrictions and patient lacking [facility]   Outcome of pt/family meeting: continue full care at this time, will discuss with rest of family regarding CPR and further treatment goals - patients daughter, MPOA, states that at this time they would not want to pursue any further surgical intervention without a discussion prior to surgery    Patient/Family primary goal(s) include: continue present treatment    Information Obtained from: daughter, health care provider and history reviewed via medical record  HPI/Discussion: Patients MPOA contacted by phone on 01/09/19 at 10:30 AM. Patients daughter states that prior to admission the patient was nonverbal and  not following commands. Patients daughter reports the patient resided in a nursing facility and was receiving tube feeds for nutrition. Patients daughter states that the patient follows closely with Dr Margart Sickles and several discussions have occurred regarding CT imaging findings, neurologic status and possible interventions moving forward (placing bone flap). Patients daughter reports that the patient would not likely want another "brain surgery" without a chance at significant recovery as her reasoning for the initial surgery was her lack of ability to utilize her right side which was causing her distress and a quality of life that she would not want to continue experiencing. Patients daughter states that she would like to hear from neurology and/or neurosurgery regarding their diagnosis, prognosis (to include any insight to possible quality recovery) prior to making any decisions regarding goals of care or treatment limitations. Patients daughter denies any further questions and is provided contact information for supportive care and primary service.     Past Family, Medical, and Social History and ROS see H&P on 01/09/19 by Dr Harrel Carina    Medical orders prior to consultation: DNR Card - No  Code Status:  Full  Treatment Limitations prior to consultation:  None    Exam:  Temperature: 36.2 C (97.2 F)  Heart Rate: 89  BP (Non-Invasive): (!) 78/49  Respiratory Rate: (!) 22  SpO2: 98 %  General: appears older than age, laying supine with HOB elevated   Eyes:  Conjunctiva clear, Sclera non-icteric, no exudate noted   HENT: trachea in place attached to ventilator, mild thin clear to yellow secretions, no surrounding erythema or exudate   Neck: No JVD or thyromegaly, trach in place   Lungs: coarse mechanical breath sounds, no crackles, no rhonchi   Cardiovascular: RRR, no click, no rub   Abdomen: soft, bowel sounds normal and non-distended  Extremities: no cyanosis or edema, grossly intact   Skin: Skin warm and dry, No  rashes  Neurologic: not following commands, withdraws from pain in all 4 extremities, pupils sluggish to light b/l     Ventilator Settings:  Set Rate: 12 Breaths Per Minute  Set PEEP: 5 cmH2O  Pressure Support: 8 cmH2O  FiO2: 30 %     Labs:  I have reviewed labs from this admission -- BUN 25, Cr 0.54, WBC 9.4, Hgb 9.6.    Imaging Studies:  Images and Reports reviewed from this admission -- CXR 01/08/2019    Consultant:  Baltazar Apo, DO    Nita Sells, DO  Internal Medicine PGY-3  Pager [phone]         I saw and examined the patient.  I reviewed the resident's note.  I agree with the findings and plan of care as documented in the resident's note.  Any exceptions/additions are edited/noted.    Pricilla Riffle, MD

## 2019-01-09 NOTE — Pharmacy (Signed)
[facility] / Department of Pharmaceutical Services  Therapeutic Drug Monitoring: Vancomycin  01/09/2019      Patient name: [name], [name]  Date of Birth:  Dec 18, 1960    Actual Weight:        BMI:       Date RPh Current regimen (including mg/kg) Indication Target Levels (mcg/mL) SCr (mg/dL) CrCl* (mL/min) Measured level (mcg/mL) Plan (including when levels are due) Comments   6/18 Janavia Rottman Vancomycin 1500mg  (20 mg/kg adjBW) IV x 1 in ED Empiric PNA  0.51    + Zosyn                                                                             *Creatinine clearance is estimated by using the Cockcroft-Gault equation for adult patients and the Carol Ada for pediatric patients.    The decision to discontinue vancomycin therapy will be determined by the primary service.  Please contact the pharmacist with any questions regarding this patient's medication regimen.

## 2019-01-09 NOTE — Nurses Notes (Signed)
63m Alteplace instilled into blue port of left arm PICC.  No resistence met. Will aspirate within 2 hours.

## 2019-01-09 NOTE — Pharmacy (Addendum)
[facility] / Department of Pharmaceutical Services  Therapeutic Drug Monitoring: Vancomycin  01/09/2019      Patient name: [name], [name]  Date of Birth:  10-20-1960    Actual Weight:        BMI:       Date RPh Current regimen (including mg/kg) Indication Target Levels (mcg/mL) SCr (mg/dL) CrCl* (mL/min) Measured level (mcg/mL) Plan (including when levels are due) Comments   6/18 [name] 1500mg  (20 mg/kg adjBW) IV x 1 in ED Empiric PNA  0.51    + Zosyn   6/18 [name] S/p 1500mg  at 220 Empiric pna 10-15 0.51 >120  Start Vancomycin 1250mg  q12h  -obtain level if to be continued >72 hours                                                                  *Creatinine clearance is estimated by using the Cockcroft-Gault equation for adult patients and the Carol Ada for pediatric patients.    The decision to discontinue vancomycin therapy will be determined by the primary service.  Please contact the pharmacist with any questions regarding this patient's medication regimen.

## 2019-01-09 NOTE — Care Plan (Signed)
Medical Nutrition Therapy Assessment        SUBJECTIVE : Pt. Has Trach and is on ventilator.  Pt. admitted with G-tube for feedings.     OBJECTIVE:     Current Diet Order/Nutrition Support:  MNT PROTOCOL FOR DIETITIAN  DIET NPO - NOW EXCEPT ALL MEDS WITH SIPS OF WATER, EXCEPT TUBE FEEDS  ADULT TUBE FEED - BOLUS TID NO MEALS, TF ONLY; OSMOLITE 1.5; OG; BOLUS; Bolus Amount: 1 Can     Provides: 1080 cal = 14 cals/kg                45 g protein = 0.7 g/kg                548 ml free water from formula         Height Used for Calculations: 170.2 cm (5' 7.01")  Weight Used For Calculations: 76.2 kg (167 lb 15.9 oz)  BMI (kg/m2): 26.36  BMI Assessment: BMI 25-29.9: overweight  Ideal Body Weight (IBW) (kg): 61.88  % Ideal Body Weight: 123.14       Estimated Needs:    Energy Calorie Requirements: 1900-2300 per day (25-30 kcals/76.2 kg BW)  Protein Requirements (gms/day): 91-110 per day (1.5-1.8 g/61 kg IBW)      Comments: 58 y.o., White female who presents as transfer from [place] ED to [facility] ED with above complaint(s).  She was noted to be clamping down onto her tongue and concern was for seizure.  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  During that prolonged admission from 07/05/18-08/07/18 she ended up with trach/peg and was sent to [facility]       Plan/Interventions :   1. Start enteral tube feeds and advance to goal     Tube Feed Formula : Osmolite 1.5 + 2 packets PROsource    Goal:   5 cans daily    Provides: 1920 cal = 25 cals/kg                105 g protein = 1.7 g/kg                914 ml free water from formula    HOLD feeds 1 hour before and 1 hour after Dilantin doses'    2. Monitor electrolytes; supplement as needed  3. Continue Miralax and senna   4. Monitor weight trend        Nutrition Diagnosis: Inability to swallow related to Patient on vent, , has Trach and neurologic deficits as  evidenced by Need for TF long term via G-tube     Monte Zinni L. Angelo Prindle, [place], LD, [place]  01/09/2019, 11:19    Pager # [phone]

## 2019-01-09 NOTE — Progress Notes (Signed)
Interval update    Patients MPOA, Ashley Royalty, requesting that patient be made no CPR but otherwise continue other treatment plan at this time.    Order placed for NO CPR     J. Eber Jones, DO  Internal Medicine PGY-3  Pager [phone]

## 2019-01-09 NOTE — ED Nurses Note (Signed)
ED attending speaking with daughter regarding plan of care

## 2019-01-09 NOTE — Care Plan (Signed)
MSW contacted Pt's daughter/MPOA [name] at [phone]. [name] explained that Pt has been comatose for 6 months. Address confirmed. Pt lives at [facility]. She will need ambulance transport for discharge. Insurance confirmed. PCP confirmed but Pt has been seeing doctors at the SNF facility. Pt has directives on file. [name] explained that she spoke with Bosie Helper with [facility] and requested to make Pt DNR but she is not sure if it is official. MSW contacted Suanne Marker with supportive care at [phone] and inquired about this. Suanne Marker explained that they would be completing DNR card once MPOA is asked for consent on an upcoming procedure.

## 2019-01-09 NOTE — Nurses Notes (Signed)
Patient transferred to MICU from ED trached to vent. MICU notified of admission. VS and assessments per flowsheets, will continue to monitor.   Gregor Hams, RN  01/09/2019, 01:53

## 2019-01-09 NOTE — Care Plan (Signed)
Patient is currently trached and ventilated.  Ventilator settings below.  No changes have been made today.  Plan for OR tomorrow.  Will continue to wean ventilator as patient tolerates.    VENTILATOR - SIMV(PRVC)PS / APV-SIMV CONTINUOUS    Discontinue   Duration: Until Specified    Priority: Routine       Question Answer Comment   FIO2 (%) 30    Peep(cm/H2O) 5    Rate(bpm) 12    Tidal Volume(mls) 400    Pressure Support(cm/H2O) 8    Indications IMPROVE DISTRIBUTION OF VENTILATION

## 2019-01-09 NOTE — Nurses Notes (Signed)
01/09/19 0600 01/09/19 0615   Vital Signs   BP (Non-Invasive) (!) 81/48 (!) 81/46   MAP (Non-Invasive) 58 mmHG 55 mmHG       MICU resident notified of low BP's despite 1 L fluid bolus. 1 additional L to be given. See MAR. Will continue to monitor.   Gregor Hams, RN  01/09/2019, 06:44

## 2019-01-09 NOTE — Consults (Signed)
[facility]  Neurosurgery Consult  Initial Consult Note      [name], [name], 58 y.o. female  Date of Admission:  01/08/2019  Date of Service: 01/09/2019  Date of Birth:  1961-02-12    Impression/Recommendations  [name] is a 58 y.o. 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

## 2019-01-09 NOTE — Care Management Notes (Signed)
[place] Management Initial Evaluation    Patient Name: [name]  Date of Birth: [birth year]  Sex: female  Date/Time of Admission: 01/08/2019 10:52 PM  Room/Bed: 05/A  Payor: [place] MEDICAID / Plan: [place] MEDICAID / Product Type: Medicaid /   Primary Care Providers:  Isac Caddy, MD, MD (General)    Pharmacy Info:   Preferred Pharmacy     [id number] - [place], [place] - [address]    [address] [place] [address]    Phone: [phone] Fax: [phone]    Not a 24 hour pharmacy; exact hours not known.        Emergency Contact Info:   Extended Emergency Contact Information  Primary Emergency Contact: [place] Phone: [phone]  Work Phone: [phone]  Mobile Phone: [phone]  Relation: Daughter  Secondary Emergency Contact: Zigmund Daniel, sandy  Address: [address]           [place], [place] [address] Johnnette Litter of [place] Phone: [phone]  Work Phone: [phone]  Mobile Phone: [phone]  Relation: Mother  Interpreter needed? No    History:   Enid Maultsby is a 58 y.o., female, admitted Seizure    Height/Weight:   / 76.2 kg (167 lb 15.9 oz)     LOS: 0 days   Admitting Diagnosis: Seizure (CMS [facility]) [R56.9]    Assessment:      01/09/19 1516   Assessment Details   Assessment Type Admission   Date of Care Management Update 01/09/19   Date of Next DCP Update 01/10/19   Readmission   Is this a readmission? No   Care Management Plan   Discharge Planning Status initial meeting   Projected Discharge Date 12/27/2018   Discharge plan discussed with: MPOA   CM will evaluate for rehabilitation potential yes   Patient choice offered to patient/family no   Form for patient choice reviewed/signed and on chart no   Patient aware of possible cost for ambulance transport?  No   Discharge Needs Assessment   Equipment Currently Used at Home none   Discharge Facility/Level of Care Needs SNF Return (Medicare certified)(code 3)   Transportation Available ambulance      Referral Information   Admission Type inpatient   Address Verified verified-no changes   Arrived From admitted as an inpatient;acute hospital, other;nursing facility  [facility] SNF)   [place] Verified verified-no change   ADVANCE DIRECTIVES   Does the Patient have an Advance Directive? Yes, Patient Does Have Advance Directive for Healthcare Treatment   Type of Advance Directive Completed Medical Power of Attorney   Copy of Advance Directives in Chart? 0   Name of [place] or Healthcare Surrogate Ashley Royalty   Phone Number of [place] or Healthcare Surrogate [phone]   Patient Requests Assistance in Having Advance Directive Notarized. N/A   LAY CAREGIVER    Appointed Lay Caregiver? I Decline   Employment/Financial   Patient has Prescription Coverage?  Yes   Living Environment   Select an age group to open "lives with" row.  Adult   Lives With facility resident   Independence to Return to Prior Arrangements yes   Home Safety   Home Assessment: No Problems Identified   Home Accessibility no concerns     MSW contacted Pt's daughter/MPOA Briana at [phone]. [name] explained that Pt has been comatose for 6 months. Address confirmed. Pt lives  at [facility]. She will need ambulance transport for discharge. Insurance confirmed. PCP confirmed but Pt has been seeing doctors at the SNF facility. Pt has directives on file. [name] explained that she spoke with Bosie Helper with Supportive Care and requested to make Pt DNR but she is not sure if it is official. MSW contacted Suanne Marker with supportive care at [phone] and inquired about this. Suanne Marker explained that they would be completing DNR card once MPOA is asked for consent on an upcoming procedure.     Discharge Plan:  SNF Return (Medicare certified) (code 3)    The patient will continue to be evaluated for developing discharge needs.     Case Manager: Collins [place], [place]  Phone:  [phone]

## 2019-01-09 NOTE — ED Nurses Note (Signed)
Received report from previous RN, pt moved from room 30, to room 14. Verbal order for foley catheter by ED attending. Urine was collected. Respiratory at bedside to change pt trach.

## 2019-01-10 ENCOUNTER — Encounter (HOSPITAL_COMMUNITY)
Admission: EM | Disposition: A | Discharge status: Hospice | Payer: Self-pay | Source: Other Acute Inpatient Hospital | Attending: INTERNAL MEDICINE

## 2019-01-10 ENCOUNTER — Encounter (HOSPITAL_COMMUNITY): Payer: Self-pay | Admitting: ANESTHESIOLOGY

## 2019-01-10 ENCOUNTER — Inpatient Hospital Stay (HOSPITAL_COMMUNITY): Payer: MEDICAID

## 2019-01-10 DIAGNOSIS — N390 Urinary tract infection, site not specified: Secondary | ICD-10-CM

## 2019-01-10 DIAGNOSIS — Z9889 Other specified postprocedural states: Secondary | ICD-10-CM

## 2019-01-10 DIAGNOSIS — G935 Compression of brain: Secondary | ICD-10-CM

## 2019-01-10 DIAGNOSIS — G919 Hydrocephalus, unspecified: Secondary | ICD-10-CM

## 2019-01-10 DIAGNOSIS — Z931 Gastrostomy status: Secondary | ICD-10-CM

## 2019-01-10 DIAGNOSIS — B952 Enterococcus as the cause of diseases classified elsewhere: Secondary | ICD-10-CM

## 2019-01-10 DIAGNOSIS — Z930 Tracheostomy status: Secondary | ICD-10-CM | POA: Diagnosis present

## 2019-01-10 DIAGNOSIS — D320 Benign neoplasm of cerebral meninges: Secondary | ICD-10-CM

## 2019-01-10 DIAGNOSIS — J9610 Chronic respiratory failure, unspecified whether with hypoxia or hypercapnia: Secondary | ICD-10-CM

## 2019-01-10 LAB — CBC WITH DIFF
BASOPHIL #: <0.1 10*3/uL (ref <=0.20)
BASOPHIL %: 1 %
EOSINOPHIL #: 0.19 10*3/uL (ref <=0.50)
EOSINOPHIL %: 3 %
HCT: 27.8 % — ABNORMAL LOW (ref 34.8–46.0)
HGB: 8.8 g/dL — ABNORMAL LOW (ref 11.5–16.0)
IMMATURE GRANULOCYTE #: <0.1 10*3/uL (ref <0.10)
IMMATURE GRANULOCYTE %: 1 % (ref 0–1)
LYMPHOCYTE #: 1.72 10*3/uL (ref 1.00–4.80)
LYMPHOCYTE %: 27 %
MCH: 28.7 pg (ref 26.0–32.0)
MCHC: 31.7 g/dL (ref 31.0–35.5)
MCV: 90.6 fL (ref 78.0–100.0)
MONOCYTE #: 0.61 10*3/uL (ref 0.20–1.10)
MONOCYTE %: 10 %
MPV: 10.6 fL (ref 8.7–12.5)
NEUTROPHIL #: 3.85 10*3/uL (ref 1.50–7.70)
NEUTROPHIL %: 58 %
PLATELETS: 222 10*3/uL (ref 150–400)
RBC: 3.07 10*6/uL — ABNORMAL LOW (ref 3.85–5.22)
RDW-CV: 15.3 % (ref 11.5–15.5)
WBC: 6.4 10*3/uL (ref 3.7–11.0)

## 2019-01-10 LAB — BASIC METABOLIC PANEL
ANION GAP: 9 mmol/L (ref 4–13)
BUN/CREA RATIO: 38 — ABNORMAL HIGH (ref 6–22)
BUN: 20 mg/dL (ref 8–25)
CALCIUM: 9 mg/dL (ref 8.5–10.2)
CHLORIDE: 102 mmol/L (ref 96–111)
CO2 TOTAL: 32 mmol/L (ref 22–32)
CREATININE: 0.52 mg/dL (ref 0.49–1.10)
ESTIMATED GFR: >60 mL/min/{1.73_m2} (ref >60)
GLUCOSE: 101 mg/dL (ref 65–139)
POTASSIUM: 2.7 mmol/L — CL (ref 3.5–5.1)
SODIUM: 143 mmol/L (ref 136–145)

## 2019-01-10 LAB — LACTIC ACID LEVEL
LACTIC ACID: 0.9 mmol/L (ref 0.5–2.2)
LACTIC ACID: 0.6 mmol/L (ref 0.5–2.2)

## 2019-01-10 LAB — URINE CULTURE: URINE CULTURE: >100000 — AB

## 2019-01-10 LAB — MRSA/MSSA MOLECULAR SCREEN, BLOOD
METHICILLIN RESISTANCE: NEGATIVE
STAPHYLOCOCCUS AUREUS: NEGATIVE

## 2019-01-10 LAB — PHENYTOIN: PHENYTOIN LEVEL: 15.4 ug/mL (ref 10.0–20.0)

## 2019-01-10 LAB — PHOSPHORUS: PHOSPHORUS: 3.3 mg/dL (ref 2.4–4.7)

## 2019-01-10 LAB — POTASSIUM: POTASSIUM: 3.5 mmol/L (ref 3.5–5.1)

## 2019-01-10 LAB — MAGNESIUM: MAGNESIUM: 2 mg/dL (ref 1.6–2.6)

## 2019-01-10 LAB — LEVETIRACETAM, SERUM: LEVETIRACETAM,SERUM: 10.2 ug/mL — ABNORMAL LOW (ref 12.0–46.0)

## 2019-01-10 SURGERY — INSERTION SHUNT VENTRICULAR PERITONEAL WITH NAVIGATION
Anesthesia: General

## 2019-01-10 MED ORDER — POTASSIUM CHLORIDE 10 MEQ/100ML IN STERILE WATER INTRAVENOUS PIGGYBACK
10.0000 meq | INJECTION | INTRAVENOUS | Status: AC
Start: 2019-01-10 — End: 2019-01-10
  Administered 2019-01-10 (×2): 0 meq via INTRAVENOUS
  Administered 2019-01-10: 10 meq via INTRAVENOUS
  Administered 2019-01-10: 08:00:00 0 meq via INTRAVENOUS
  Administered 2019-01-10 (×3): 10 meq via INTRAVENOUS
  Administered 2019-01-10: 07:00:00 0 meq via INTRAVENOUS
  Filled 2019-01-10 (×4): qty 100

## 2019-01-10 MED ORDER — POTASSIUM BICARBONATE-CITRIC ACID 20 MEQ EFFERVESCENT TABLET
40.0000 meq | EFFERVESCENT_TABLET | ORAL | Status: AC
Start: 2019-01-10 — End: 2019-01-10
  Administered 2019-01-10: 40 meq via GASTROSTOMY
  Filled 2019-01-10: qty 2

## 2019-01-10 MED ORDER — LORAZEPAM 2 MG/ML INJECTION SOLUTION
2.0000 mg | INTRAMUSCULAR | Status: AC
Start: 2019-01-10 — End: 2019-01-10

## 2019-01-10 MED ORDER — GADOBUTROL 7.5 MMOL/7.5 ML (1 MMOL/ML) INTRAVENOUS SOLUTION
7.50 mL | INTRAVENOUS | Status: AC
Start: 2019-01-10 — End: 2019-01-10
  Administered 2019-01-10: 01:00:00 7.5 mL via INTRAVENOUS

## 2019-01-10 MED ORDER — LORAZEPAM 2 MG/ML INJECTION SOLUTION
INTRAMUSCULAR | Status: AC
Start: 2019-01-10 — End: 2019-01-10
  Administered 2019-01-10: 2 mg via INTRAVENOUS
  Filled 2019-01-10: qty 1

## 2019-01-10 MED ORDER — ENOXAPARIN 40 MG/0.4 ML SUBCUTANEOUS SYRINGE
40.0000 mg | INJECTION | SUBCUTANEOUS | Status: DC
Start: 2019-01-10 — End: 2019-01-12
  Administered 2019-01-10 – 2019-01-11 (×2): 40 mg via SUBCUTANEOUS
  Filled 2019-01-10 (×2): qty 0.4

## 2019-01-10 MED ORDER — LORAZEPAM 2 MG/ML INJECTION SOLUTION
INTRAMUSCULAR | Status: AC
Start: 2019-01-10 — End: 2019-01-11
  Administered 2019-01-10: 22:00:00 0 mg
  Filled 2019-01-10: qty 1

## 2019-01-10 MED ADMIN — amino ac-protein hydro-whey protein 10 gram-100 kcal/30 mL oral liquid: GASTROSTOMY | @ 21:00:00

## 2019-01-10 MED ADMIN — cloBAZam 10 mg tablet: GASTROSTOMY | @ 22:00:00

## 2019-01-10 MED ADMIN — potassium chloride 10 mEq/100mL in sterile water intravenous piggyback: INTRAVENOUS | @ 07:00:00

## 2019-01-10 MED ADMIN — famotidine 20 mg tablet: GASTROSTOMY | @ 10:00:00

## 2019-01-10 MED ADMIN — Medication: INTRAVENOUS | @ 06:00:00

## 2019-01-10 MED ADMIN — phenytoin 50 mg chewable tablet: @ 22:00:00

## 2019-01-10 MED ADMIN — cloBAZam 10 mg tablet: GASTROSTOMY | @ 06:00:00

## 2019-01-10 MED ADMIN — sodium chloride 0.9 % (flush) injection syringe: @ 06:00:00

## 2019-01-10 MED ADMIN — phenytoin 50 mg chewable tablet: GASTROSTOMY | @ 22:00:00

## 2019-01-10 MED ADMIN — potassium chloride 10 mEq/100mL in sterile water intravenous piggyback: INTRAVENOUS | @ 08:00:00

## 2019-01-10 MED ADMIN — Medication: INTRAVENOUS | @ 04:00:00

## 2019-01-10 MED ADMIN — phenytoin 50 mg chewable tablet

## 2019-01-10 MED ADMIN — potassium bicarbonate-citric acid 20 mEq effervescent tablet: GASTROSTOMY | @ 06:00:00

## 2019-01-10 MED ADMIN — ipratropium 0.5 mg-albuteroL 3 mg (2.5 mg base)/3 mL nebulization soln: RESPIRATORY_TRACT | @ 22:00:00

## 2019-01-10 MED ADMIN — senna leaf extract 176 mg/5 mL oral syrup: GASTROSTOMY | @ 22:00:00

## 2019-01-10 MED ADMIN — potassium chloride 10 mEq/100mL in sterile water intravenous piggyback: INTRAVENOUS | @ 09:00:00

## 2019-01-10 MED ADMIN — Medication: INTRAVENOUS | @ 10:00:00

## 2019-01-10 MED ADMIN — Medication: INTRAVENOUS | @ 14:00:00

## 2019-01-10 MED ADMIN — levETIRAcetam 500 mg tablet: GASTROSTOMY | @ 22:00:00

## 2019-01-10 MED ADMIN — phenytoin 50 mg chewable tablet: @ 21:00:00

## 2019-01-10 MED ADMIN — Medication: INTRAVENOUS | @ 18:00:00

## 2019-01-10 MED ADMIN — polyethylene glycol 3350 17 gram oral powder packet: GASTROSTOMY | @ 10:00:00

## 2019-01-10 MED ADMIN — ipratropium 0.5 mg-albuteroL 3 mg (2.5 mg base)/3 mL nebulization soln: RESPIRATORY_TRACT | @ 07:00:00

## 2019-01-10 MED ADMIN — potassium chloride 10 mEq/100mL in sterile water intravenous piggyback: INTRAVENOUS | @ 06:00:00

## 2019-01-10 MED ADMIN — senna leaf extract 176 mg/5 mL oral syrup: GASTROSTOMY | @ 10:00:00

## 2019-01-10 MED ADMIN — lacosamide 100 mg tablet: GASTROSTOMY | @ 10:00:00

## 2019-01-10 MED ADMIN — LORazepam 2 mg/mL injection solution: INTRAVENOUS | @ 21:00:00

## 2019-01-10 MED ADMIN — phenytoin 50 mg chewable tablet: @ 06:00:00

## 2019-01-10 SURGICAL SUPPLY — 52 items
BLANKET MISTRAL-AIR ADULT LWR BODY 55.9X40.2IN FRC AIR HI VOL BLWR INTUITIVE CONTROL PNL LRG LED (MISCELLANEOUS PT CARE ITEMS) IMPLANT
BLANKET WARMER 55.9INW X 40.2I_LOWER BODY MISTRAL-AIR (MISCELLANEOUS PT CARE ITEMS)
CANNULA INJ 17GA NDLS SYRG BLUNT STRL LF  10ML BD INTRLNK PLASTIC BXTR INTLNK ABT LS MCGAW SAFELINE (IV TUBING & ACCESSORIES) IMPLANT
COMB HAIR PLASTIC MINI FIRM FL_XB TTH HNDL NRW SPC BLK ADLT (TOLT)
CONV USE ITEM 161877 - COMB HAIR PLASTIC MINI FIRM FL_XB TTH HNDL NRW SPC BLK ADLT (TOLT) IMPLANT
CONV USE ITEM 337890 - PACK SURG BSIN 2 STRL LF  DISP (CUSTOM TRAYS & PACK) IMPLANT
CONV USE ITEM 338688 - PACK SURG NEURO SHUNT NONST DISP LF (CUSTOM TRAYS & PACK) IMPLANT
COVER JAW SURGIPAW RED 1/10IN_121000 BX/10 (INSTRUMENTS)
COVER JAW SURGIPAW RED 1/10IN_121000 BX/10 (SURGICAL INSTRUMENTS) IMPLANT
COVER WAND RFD STRL 50EA/CS_01-0020 (EQUIPMENT MINOR)
COVER WND RF DETECT STRL CLR EQP (EQUIPMENT MINOR) IMPLANT
DISCONTINUED SUB PENDING - MANOMETER CVP MEDEX 4W STOPCOCK EXT SET POLE CLAMP MALE LL 35CM 50IN CEN VEN STRL LF  DISP (CUFF) IMPLANT
DUPE USE ITEM 49603 - TROCAR BLUNTPORT W/FOAM GRIP_176626P 3EA/BX (ENDOSCOPIC SUPPLIES) IMPLANT
GARMENT COMPRESS MED CALF CENTAURA NYL VASOGRAD LTWT BRTHBL SEQ FIL BLU 18- IN (ORTHOPEDICS (NOT IMPLANTS)) IMPLANT
GARMENT COMPRESS MED CALF CENT_AURA NYL VASOGRAD LTWT BRTHBL (ORTHOPEDICS (NOT IMPLANTS))
GOWN SURG 2XL AAMI L3 REINF HK_LP CLSR SET IN SLEEVE STRL LF (PROTECTIVE PRODUCTS/GARMENTS)
GOWN SURG 2XL L3 REINF HKLP CLSR SET IN SLEEVE STRL LF  DISP BLU SIRUS SMS 49IN (PROTECTIVE PRODUCTS/GARMENTS) IMPLANT
HANDLE RIGID PLASTIC STRL LF  DISP DVN EZ HNDL SURG LIGHT (INSTRUMENTS) IMPLANT
HANDLE RIGID PLASTIC STRL LF_DISP DVN EZ HNDL SURG LIGHT (INSTRUMENTS)
MANOMETER CVP MEDEX 4W STOPCOCK EXT SET POLE CLAMP MALE LL 35CM 50IN CEN VEN STRL LF  DISP (CUFF) IMPLANT
MBO USE ITEM 317672 - HANDLE RIGID PLASTIC STRL LF  DISP DVN EZ HNDL SURG LIGHT (SURGICAL INSTRUMENTS) IMPLANT
PACK BASIN DBL CUSTOM (CUSTOM TRAYS & PACK)
PACK SURG NEURO SHUNT NONST DISP LF (CUSTOM TRAYS & PACK)
PACK SURG TBG STRL DISP 30IN SIL LF (Connecting Tubes/Misc) IMPLANT
PERFORATOR 11-14MM CRANIAL DGR-O SURG STRL DISP PED (SURGICAL INSTRUMENTS) IMPLANT
PERFORATOR 11MM 7MM MINI CRANIAL DGR-II SURG STRL DISP PED (INSTRUMENTS) IMPLANT
SET BLOOD COL 23GA .75IN SFTWNG WNG NEEDLE TUBE CLAMP 12IN STRL LF  DISP (IV TUBING & ACCESSORIES) IMPLANT
SET BLOOD COL 23GA .75IN SFTWN_G WNG NEEDLE TUBE CLAMP 12IN (IV TUBING & ACCESSORIES)
SET TUBING PNEUMOCLEAR HIFLO S MOKE EVAC DEMOTE (Connecting Tubes/Misc)
SET TUBING PNEUMOCLEAR HIFLO SMOKE EVAC (Connecting Tubes/Misc) IMPLANT
SHEATH 61CM POLYPROP CVR SHUNT TRANSLUC INTROD PERITONEUM (GUIDING)
SHEATH INTROD 61CM PERI POLYPROP TRANSLUC REPL STRL REUSE (GUIDING) IMPLANT
SOL ANFG DFGR ISOPRPNL PAD OVAL BTL NABRSV ADH STRL LF  DISP (KITS & TRAYS (DISPOSABLE)) IMPLANT
SOLUTION ANTI FOG W/SPONGE 280101 DEFOG ENDOMATE 20EA/CS (KITS & TRAYS (DISPOSABLE))
STYLET 2 COIL SNGL PK (NEUR)
STYLET NAVIGATE 2 COIL STRL LF  DISP (NEUR) IMPLANT
SUTURE 1 PERMAHAND SUTUPK 60IN BLK BRD TIE 2 STRN PCUT NONAB (SUTURE/WOUND CLOSURE) IMPLANT
SUTURE 3-0 SH VICRYL 18IN VIOL CR BRD 8 STRN COAT ABS (SUTURE/WOUND CLOSURE) IMPLANT
SUTURE 3-0 SH VICRYL 18IN VIOL_CR BRD 8 STRN COAT ABS (SUTURE/WOUND CLOSURE)
SUTURE SILK 0 PERMAHAND 30IN BLK BRD TIE 6 STRN PCUT NONAB (SUTURE/WOUND CLOSURE) IMPLANT
SYRINGE LL 10ML LF  STRL GRAD N-PYRG DEHP-FR PVC FREE MED DISP (NEEDLES & SYRINGE SUPPLIES) IMPLANT
SYRINGE LL 5ML LF STRL GRAD N-PYRG DEHP-FR PVC FREE MED DISP CLR (NEEDLES & SYRINGE SUPPLIES) IMPLANT
TRACKER NAVIGATE NINVS STRL LF DISP (NEUR)
TRACKER NAVIGATE STRL LF  DISP (NEUR) IMPLANT
TRAY CATH 16FR LUBRISIL CMP CR 2 CONN ADV FOLEY SAF FLOW URMTR SILVER LF (Drains/Resovoirs) IMPLANT
TROCAR LAPSCP STD 100MM 11MM VERSAONE SMOOTH CANN BLADE STRL LF  DISP (ENDOSCOPIC SUPPLIES) IMPLANT
TROCAR LAPSCP STD 100MM 5MM VERSAONE FIX CANN BLDLS DLPHN NOSE TIP STRL LF  DISP (ENDOSCOPIC SUPPLIES) IMPLANT
TROCAR SPLIT 82-4095 (TROC)
TROCAR SURG 10CM SPLT STRL DISP (TROC) IMPLANT
TUBE INDIV SPECI STRL (TUB) IMPLANT
TUBE SP INDIV STRL (TUB)
TUBING SILICONE 30IN (Connecting Tubes/Misc)

## 2019-01-10 NOTE — Consults (Signed)
Interval consult note    Spoke to Neurosurgery regarding VP shunt placement. Neurosurgery will not be placing VP shunt at this time and are cancelling the procedure. Therefore, general surgery will sign off at this time. Please call or page service with questions or concerns.     Dimple Nanas, MD

## 2019-01-10 NOTE — Care Plan (Signed)
OTHER(Specify in Comments) IS/PEP, AIRWAY CLEARANCE, Wean O2       01/09/19 0431    01/09/19 0330  VENTILATOR - SIMV(PRVC)PS / APV-SIMV CONTINUOUS    Discontinue   Duration: Until Specified    Priority: Routine       Question Answer Comment   FIO2 (%) 30    Peep(cm/H2O) 5    Rate(bpm) 12    Tidal Volume(mls) 400    Pressure Support(cm/H2O) 8    Indications             Patient remains on the above vent settings. Plan to go to OR in the morning for shunt. Will wean as tolerated.

## 2019-01-10 NOTE — Ancillary Notes (Signed)
[place]  MRI Technologist Note        MRI has been completed.        Kallie Edward, RTR 01/10/2019, 00:58

## 2019-01-10 NOTE — Progress Notes (Signed)
Called to patient's bedside due to patient patient biting and clamping down on her tongue and concern for possible seizure. During bedside assessment tongue biting was appreciated and patient's extremities were also semi-rigid. 2mg  of IV ativan was administered. Neurology was consulted and assisted with patient assessment. After review by Neurology, it was felt that there was a low likilihood that the patient was having an active seizure. Plan to continue to monitor patient at this time. Plan to restart dilantin if serum level not therapeutic. Thank you to Neurology for their assistance.    Marny Lowenstein, MD PGY-1  [facility] Internal Medicine

## 2019-01-10 NOTE — Progress Notes (Signed)
Medical Intensive Care Unit  PROGRESS NOTE     Name: [name], [name], 58 y.o. female Date of service: 01/10/2019   Room: 05/A  LOS: 1    MRN: [id number] Attending: Algis Liming, MD   Code Status: No CPR Admitted to MICU for:  seizure like activity      SUBJECTIVE  INTERVAL EVENTS:     58 y.o F with persistent severe neurologic deficits currently admitted with concerns for seizure and HCAP. All stemmed from meningioma resection complicated by post-op hematoma back in 06/2018, s/p Trach/PEG, has been in [facility] since that time with severe neurologic deficits.     NAEON, pt. went to MRI w/o any problems. Pt. was made no CPR yesterday, but to continue all care otherwise.     OBJECTIVE:    VITAL SIGNS:  Temperature: 36.4 C (97.5 F) BP (Non-Invasive): (!) 142/72 Heart Rate: 81   Respiratory Rate: 18 SpO2: 100 % Weight: 76.8 kg (169 lb 5 oz)        INTAKE & OUTPUT:  Net Since Admission: +3.3 L     Intake/Output Summary (Last 24 hours) at 01/10/2019 0745  Last data filed at 01/10/2019 0700  Gross per 24 hour   Intake 2412 ml   Output 1355 ml   Net 1057 ml     Last Bowel Movement: (PTA)      VENTILATOR SETTINGS:  Conventional settings:  Set VT: 400 mL  Set Rate: 12 Breaths Per Minute  Set PEEP: 5 cmH2O  Pressure Support: 8 cmH2O  FiO2: 30 %       LINES & DRAINS:   Patient Lines/Drains/Airways Status    Active Line / Dialysis Catheter / Dialysis Graft / Drain / Airway / Wound     Name: Placement date: Placement time: Site: Days:    Peripheral IV Ultrasound guided;Extended dwell catheter Left;Lower Arm  01/09/19   0300   1    Peripheral IV Ultrasound guided;Extended dwell catheter Right;Lower Arm  01/09/19   0300   1    PICC Double Lumen Left  -   -   -      Gastrostomy Tube Center  07/15/18   -   179    Foley Catheter  01/09/19   0040   1    Tracheostomy 6 Cuffed  07/15/18   1241   178    Wound (Non-Surgical) Perineum;Groin;Thigh  07/19/18   0800   174    Wound (Non-Surgical) Right Buttock;Thigh  01/09/19   0400   1                   DAILY EXAMINATION:  General: In poor overall appearing health  Head: defect on L side where flap present   Eyes: PERRLA, sclareae non-icteric, conjunctivae clear  Throat: Oropharynx clear, mucous membranes moist.  Neck: Trachea midline.  Cardiovascular: RRR, no murmurs, rubs, gallops.  Respiratory: CTABL, no wheezes, rhonchi, crackles.  Abdominal: BS normal; abdomen soft, non-tender to palpation  Extremities: No peripheraledema. DPP 2+ bilaterally.   Skin: Warm and dry.  Neurological: Withdraws from pain only, not responding to commands  [place] Coma Scale Score: 6       LABS  IMAGING  MICROBIOLOGY  PATHOLOGY:     I have reviewed all of the recent labs, imaging, microbiology and pathology.    CBC Differential   Recent Labs     01/08/19  2306 01/09/19  0622 01/10/19  0347   WBC 14.4* 9.4 6.4   HGB  11.9 9.6* 8.8*   HCT 36.3 29.9* 27.8*   PLTCNT 366 301 222    Recent Labs     01/08/19  2306 01/09/19  0622 01/10/19  0347   PMNS 76 86 58   MONOCYTES _0 BASOPHILS 0  <0.10 0  <0.10 1  <0.10   PMNABS 11.06* 8.05* 3.85   LYMPHSABS 1.82 0.97* 1.72   MONOSABS 1.04 0.29 0.61   EOSABS 0.36 <0.10 0.19      BMP LFTs   Recent Labs     01/09/19  1613 01/10/19  0347   SODIUM 140 143   POTASSIUM 3.0* 2.7*   CHLORIDE 102 102   CO2 28 32   BUN 22 20   CREATININE 0.63 0.52   GLUCOSENF 143* 101   ANIONGAP 10 9   BUNCRRATIO 35* 38*   GFR >60 >60   CALCIUM 9.1 9.0   MAGNESIUM  --  2.0   PHOSPHORUS  --  3.3    No results found for this encounter   CoAgs Blood Gas:   No results found for this encounter No results found for this encounter    Cardiac Markers Lipid Panel   Recent Labs     01/08/19  2306   TROPONINI 19    No results found for this encounter   Urine Analysis Other Labs   No results found for this encounter No results found for this encounter    Invalid input(s): PRL     Blood Cx: 6/17: 3 of 6 GPC  +MRSA screen on 6/18    MRI Brain: radiology report  IMPRESSION:  1.   Stable postoperative changes from left  tentorial meningioma resection.  2.  Unchanged dilation of the left lateral ventricle especially the  temporal horn. There is unchanged herniation of the left hemisphere  especially the temporal lobe through the craniectomy defect. These findings  are however worse than the prior CT of August 04, 2018.    CXR: 6/17 - suspect retrocardiac atelectasis/consolidation     ASSESSMENT:     58 yo F with persistent severe neurologic deficits admitted with behavior change/AMS concerning for seizure, as well as CAP    Daily Updates - MRI brain overnight, planning for VP shunt today with neuro and general surgery.      PLAN:   NEURO:  GCS: E1=None (Does Not Open Eyes) M4=Withdraws To Pain V1=None (Makes No Noise or Intubated)  Imaging: MRI brain same as CT, CT showed ventriculomegaly with increased herniation through craniotomy  Sz prophylaxis: Dilantin, Keppra, Vimpat, Clobazam   Sedation/analgesia: none  remains on continuous EEG  Neurosurgery consulted, not recommending any intervention during this hospitalization.     CARDIOVASCULAR:  MAP's stable 76-97 overnight, HR 79-89  Meds: Home amlodipine, lisinopril, lopressor held  Pressors: none        PULMONARY:  Airway Ventilator Settings   Trach Conventional settings:  Set VT: 400 mL  Set Rate: 12 Breaths Per Minute  Set PEEP: 5 cmH2O  Pressure Support: 8 cmH2O  FiO2: 30 %     Nebs: duonebs prn  CXR: retrocardiac opacity   Plan: continue Vanc/Zosyn for CAP with +MRSA screen and 3/6 GPC cultures    GI:  NPO, PEG tube in place for tube feeds  Last BM: PTA  Proph: Miralax, Senokot, Pepcid    RENAL/GU:  Na 140, K 3, Cl 02, HCO3 28, Cr 0.63, Glu 143  Potassium repletion ordered    I/O last  24 hours:  +1.1L  urine output 0.7 ml/kg/hr  Foley in critically ill pt for strict I/O's  IV fluids: NS @ 75 ml/hr  Diuretics: none    HEME:  Hb 8.8 today from 9.6 from 11.9  Transfusions: none  Proph: SCD's, was getting heparin SQ, held for surgery    ID:  Temp 36.4C overnight  WBC 6.4 from 9.4  from 14.4  Blood cultures: 3/6 from 6/17 GPC, + MRSA screen  Urine cultures: Enterococcus >100,000   BAL: Pending    ABX: Vanc/Zosyn for CAP, UTI  Remove PICC today, repeat blood cultures, catheter tip culture    ENDO:  Glu well controlled 100-150, no insulin ordered    MSK:  No skin breakdown    OTHER:  Activity: Bedrest, HOB 30deg  PT/OT:  ordered  MNT:  ordered    PLAN:  Transfer to medicine  remove PICC, draw cultures, PICC line tip culture  supportive care following, likely changing to limited interventions vs. CMO later today per daughter       Family communication/Prognosis:   Family member Ali Lowe updated about patient's current condition and prognosis.     Prognosis is Poor.    Ileene Rubens, MD 01/10/2019, 07:45   PGY-2 Emergency Medicine  [facility]    ----------------------------------------------------------------------------------------------------------------------  Attending Note:   DOS: 01/10/2019    I have seen and evaluated the patient with the medical staff, all labs, and imaging studies reviewed.   I agree with the findings including assessment and plan as documented in the resident's note.  Any exceptions/additions are noted .    Case was discussed in the multidiscipliary rounds.      Eula Flax MD  Assistant Professor, Pulmonary/Critical Care

## 2019-01-10 NOTE — Procedures (Signed)
NAMEVELENCIA, [name]   HOSPITAL NUMBER:  [id number]  DATE OF SERVICE:  01/09/2019  DOB:  05/21/1961  SEX:  F      Date of Study:  January 09, 2019, start time 08:59:20, end time January 10, 2019, at 11:03:54.   EEG #: [id number].  Technician initials:  MS.    REQUESTING PHYSICIAN:  Algis Liming, MD.    HISTORY:  The patient is a 58 year old woman with history of left tentorial meningioma status post left temporal craniotomy for resection complicated by hemorrhage, craniectomy with evacuation.  EEG is requested to evaluate jaw-clenching episodes.    MEDICATIONS:  1. Keppra 1500 twice a day.  2. Vimpat 200 twice a day.  3. Dilantin 100 q.8 hours.  4. Onfi 20 q.8 hours.    REPORT:  This is a digitally acquired video EEG performed with the standard 10-20 system of electrode placement.      The background was characterized by generalized theta admixed with polymorphic delta activity.  No posterior dominant rhythm or anteroposterior gradient was seen.      The background was symmetric but discontinuous.     Poorly formed sleep spindles were seen occasionally.     Hyperventilation and photic stimulation were not performed.     No clinical events were marked.    ABNORMAL ACTIVITY:  Near continuous left central/parietal periodic discharges (LPDs) were seen at 1 Hz in the initial several hours of the record, and these resolved in the overnight record.   No definite seizures were recorded and no clinical correlate was seen to the above-described periodic discharges.    INTERPRETATION:  Near continuous left central/parietal periodic discharges(LPDs) at 1 Hz without a definite clinical correlate in the initial several hours of the study, which resolved in the overnight record.   These suggest an underlying highly epileptic focus.  No definite seizures were recorded.      Olean Ree MD  Assistant Professor, Neurology  [facility]            CC:   Algis Liming, MD   Fax: [phone]       DD:  01/10/2019  15:02:05  DT:  01/10/2019 15:29:39 DG  D#:  [id number]

## 2019-01-10 NOTE — Consults (Signed)
[facility]  Neurology Interval/Cross Coverage Note    [name]  01/10/19 21:05        Interval/Cross coverage update  Contacted by Medicine cross covering team. There is concern that patient continues to have tongue biting episodes.     Patient's neuro exam is same as baseline. GCS 3/5/1t with trach in place. Does not track examiner or follow commands. Withdraws in all extremities.     Reviewed continuous vEEG report. While the events in question were not marked in the study, the EEG was ordered to evaluate these tongue biting spells. While on EEG the patient had frequent periodic discharges in the left central/ parietal region at the site of prior craniotomy. This indicates increased epileptic potential (likely breach rhythm), but she did not have any seizures while on EEG monitoring for 24 hours. vEEG just discontinued this morning.     Low likelihood that these are seizures given that EEG was negative. Would not recommend repeat continuous monitoring at this time for these spells.     Patient's dilantin was held due to elevated level. Recommend repeating a level. If normal, would restart dilantin.     Ashley Murrain, MD

## 2019-01-10 NOTE — Respiratory Therapy (Signed)
Patient resting comfortably in bed. Currently on ATC 28% with a saturation of 98%.  BBS are diminished. Will continue to monitor.

## 2019-01-10 NOTE — Progress Notes (Signed)
[facility]  Medicine Progress Note  No CPR    [name]  Date of service: 01/10/2019    Subjective: [name] is a 58 yo female who was transferred this afternoon from the MICU to stepdown level of care. 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  On 6/18, patient was found at the nursing home clamping teeth down onto tongue and was not releasing. She was given Valium gel to finally get patient to release. No significant damage to tongue reported. She was then taken to the outside hospital where CT scan head did not show acute changes. She is unfortunately bedridden, non verbal and does not follow commands at baseline. The patient was transferred to our facility for concern for seizure activity. On arrival the patient was stable however, she was found to be biting very hard on her tongue and was given total of 7mg  IV ativan which helped relaxing her jaw and stabilizing the tongue inside the mouth using tongue depressors. The patient was also given 1500 mg of Keppra. Also, her chest XR and increased O2 requirements were concerning for pneumonia. Her BC grew Staph epi and urine cultures grew E fecalis. Patient was initially started on Vanc and Zosyn, however the Zosyn was discontinue and patient is currently on Vanc only.    Vital Signs:  Temp (24hrs) Max:36.6 C (08.6 F)      Systolic (76PPJ), KDT:267 , Min:70 , TIW:580     Diastolic (99IPJ), ASN:05, Min:50, Max:82    Temp  Avg: 36.2 C (97.2 F)  Min: 35.6 C (96.1 F)  Max: 36.6 C (97.9 F)  MAP (Non-Invasive)  Avg: 80.9 mmHG  Min: 57 mmHG  Max: 97 mmHG  Pulse  Avg: 87.8  Min: 79  Max: 104  Resp  Avg: 16.6  Min: 12  Max:  21  SpO2  Avg: 99.7 %  Min: 99 %  Max: 100 %     Fi02    I/O:  I/O last 24 hours:      Intake/Output Summary (Last 24 hours) at 01/10/2019 2024  Last data filed at 01/10/2019 1941  Gross per 24 hour   Intake 418 ml   Output 1330 ml   Net -912 ml     I/O current shift:  06/19 1900 - 06/20 0659  In: -   Out: 425 [Urine:425]  Blood Sugars: 101    cloBAZam (ONFI) tablet, 20 mg, Gastric (NG, OG, PEG, GT), Q8H  enoxaparin PF (LOVENOX) 40 mg/0.4 mL SubQ injection, 40 mg, Subcutaneous, Q24H  famotidine (PEPCID) tablet, 20 mg, Gastric (NG, OG, PEG, GT), 2x/day  ipratropium-albuterol 0.5 mg-3 mg(2.5 mg base)/3 mL Solution for Nebulization, 3 mL, Nebulization, 4x/day  lacosamide (VIMPAT) tablet, 200 mg, Gastric (NG, OG, PEG, GT), 2x/day  levETIRAcetam (KEPPRA) tablet, 1,500 mg, Gastric (NG, OG, PEG, GT), 2x/day  NS flush syringe, 2 mL, Intracatheter, Q8HRS    And  NS flush syringe, 2-6 mL, Intracatheter, Q1 MIN PRN  nutrition protein supplement 15 g per 30 mL packet, 1 Packet, Gastric (NG, OG, PEG, GT), 2x/day  [Held by provider] phenytoin (DILANTIN) chewable tablet, 100 mg, Gastric (NG, OG, PEG, GT), Q8HRS  polyethylene glycol (MIRALAX) oral packet, 17 g, Gastric (NG,  OG, PEG, GT), Daily  senna concentrate (SENNA) 528mg  per 44mL oral liquid, 10 mL, Gastric (NG, OG, PEG, GT), 2x/day  vancomycin (VANCOCIN) 1,250 mg in NS 250 mL IVPB, 18 mg/kg (Adjusted), Intravenous, Q12H  Vancomycin IV - Pharmacist to Dose per Protocol, , Does not apply, Daily PRN        No Known Allergies    Physical Exam:  General: appears chronically ill  BPZ:WCHE without erythema or injection, mucous membranes moist.  Neck: no thyromegaly or lymphadenopathy  Carotids:Carotids normal without bruit  Respiratory: Clear to auscultation bilaterally.   Cardiovascular: regular rate and rhythm  Gastrointestinal: Soft, non-tender  Lymphatic/Immunologic/Hematologic: No lymphadenopathy  Psychiatric: Unable to assess due to vegetative state   Musculoskeletal: Left  temporal edema  Neurologic: (limited due to unresponsiveness) awake, unable to answer orientation questions, pupils sluggish reaction to light, no facial droop, protruded tongue; bedridden  [place]:  Eye opening:  4 spontaneous, Verbal response:  1 no response, Best motor response: 3 withdrew from painful stimuli  Mental status: unable to assess due to unresponsiveness     Labs:  I have reviewed all labs.    HgB8.8  K2.7    Radiology:    CT LAB BRAIN WO IV CONTRAST   Final Result      MRI BRAIN W/WO CONTRAST   Final Result   1.   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   2.  Unchanged dilation of the left lateral ventricle especially the   temporal horn. There is unchanged herniation of the left hemisphere   especially the temporal lobe through the craniectomy defect. These findings   are however worse than the prior CT of August 04, 2018.         XR AP MOBILE CHEST   Final Result   Suspect retrocardiac atelectasis/consolidation.               Microbiology:    [facility] (6/17) grew Staph epi  [facility] (6/18) grew E fecalis  Legionella and strep antigen negative  MRSA screen Positive      Consults: Neurology, Neurosurgery, Supportive care    Hardware (lines, foley's, tubes):   Patient Lines/Drains/Airways Status    Active Line / Dialysis Catheter / Dialysis Graft / Drain / Airway / Wound     Name: Placement date: Placement time: Site: Days:    Peripheral IV Ultrasound guided;Extended dwell catheter Left;Lower Arm  01/09/19   0300   1    Peripheral IV Ultrasound guided;Extended dwell catheter Right;Lower Arm  01/09/19   0300   1    Gastrostomy Tube Center  07/15/18   --   179    Foley Catheter  01/09/19   0040   1    Tracheostomy 6 Cuffed  07/15/18   1241   179    Wound (Non-Surgical) Perineum;Groin;Thigh  07/19/18   0800   175    Wound (Non-Surgical) Right Buttock;Thigh  01/09/19   0400   1                Assessment/ Plan:   Active Hospital Problems    Diagnosis   . Primary Problem: Seizure (CMS  [place])   . 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 PEG (percutaneous endoscopic gastrostomy) status (CMS [facility])   . Chronic respiratory failure (CMS [facility])   . Vegetative state (CMS [place])   . CAP (community acquired pneumonia)   . Hypokalemia   . 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    Jaw clenching  Tongue biting  Hx of epilepsy  - neurology consulted, will appreciate recs  - Q4H neuro checks and seizure precautions  - pt was off video EEG when I examined her at bedside today  - AED levels checked, Dilantin level high  - Current AEDs:    -- levetiracetam (Keppra)1500 mg 2x/day   -- Lacosamide (Vimpat)200 mg 2x/day   -- phenytoin (Dilantin)100 mg q8h (held due to  increased level)  -- Clobazam (Onfi)20 mg q8h  - will avoid medications that can lower seizure threshold such as Wellbutrin, Ultram, Lithium, Lidocaine IV, Quinolones, Imipenem or Cefepime  - NSGY was consulted for worsening left hemisphere herniation compared to Jan, 2020 with hydrocephalus ex vacuo, no intervention at this time  - Supportive care consulted, will appreciate input   -- I discussed with daughter over the phone, who decided No intubation; they want to discuss more with family tomorrow about comfort care after they visit the patient in the hospital    Staph epi bacteremia  - BC as above  - Chest XR showed retrocardiac opacity,  concerning for pneumonia  - will continue Vanc for now, continue duo nebs  - repeat labs in AM    E fecalis UTI  -sensitive to Vanc    DVT/PE Prophylaxis: enoxaparin    MNT PROTOCOL FOR DIETITIAN  ADULT TUBE FEED - BOLUS TID NO MEALS, TF ONLY; OSMOLITE 1.5; OG; BOLUS; Bolus Amount: 1 Can  DIET NPO - SPECIFIC DATE & TIME INCLUDING TUBE FEEDS     Disposition Planning: TBD    [name]  PGY 1, Neurology  01/10/2019, 22:26        This is a late entry from 6/20. I saw and examined the patient on 01/10/2019.  I reviewed the resident's note.  I agree with the findings and plan of care as documented in the resident's note.  Any exceptions/additions are edited/noted.    [name] Guadeloupe, MD 01/11/2019, 06:16  Department of Internal Medicine  [facility]

## 2019-01-10 NOTE — Care Management Notes (Signed)
[facility] Management Note    Patient Name: [name]  Date of Birth: [birth year]  Sex: female  Date/Time of Admission: 01/08/2019 10:52 PM  Room/Bed: 05/A  Payor: [place] MEDICAID / Plan: [place] MEDICAID / Product Type: Medicaid /    LOS: 1 day   Primary Care Providers:  Isac Caddy, MD, MD (General)    Admitting Diagnosis:  Seizure (CMS [facility]) [R56.9]    Assessment:      01/10/19 1338   Assessment Details   Assessment Type Continued Assessment   Date of Care Management Update 01/10/19   Date of Next DCP Update 01/13/19   Care Management Plan   Discharge Planning Status plan in progress   Projected Discharge Date 01/14/2019   Discharge Needs Assessment   Discharge Facility/Level of Care Needs SNF Return (Medicare certified)(code 3)   Plan to transfer to medicine floor. Supportive care following with plan to likely change plan of care to limited interventions vs CMO. Overall prognosis is poor.     Discharge Plan:  SNF Return (Medicare certified) (code 3)  Discharge disposition undetermined. Pt may be CMO. Overall prognosis poor. Pt [facility] at [facility].     The patient will continue to be evaluated for developing discharge needs.     Case Manager: Theodis Aguas  Phone: handoff given to MSW [place], [phone]

## 2019-01-10 NOTE — Nurses Notes (Signed)
PICC line pulled per order. PICC line from an outside facility. Met resistance when initially trying to pull PICC line out. Warm compress placed to left upper arm for approximately 1 hour before line was able to be removed. Checked site every 10-15 while warm compress was on patient's arm. 38 cm intact. Occlusive dressing with bacitracin applied. Tip sent for culture.

## 2019-01-10 NOTE — Consults (Signed)
[facility] Medicine Consult  Follow Up Note    Emile, Dynastee, 58 y.o. female  Date of Service: 01/10/2019  Date of Birth:  Nov 25, 1960    Hospital Day:  LOS: 1 day     Assessment/Recommendations:      Encounter for palliative care for adult failure to thrive secondary to altered mental status from hydrocephalus, seizures, and h/o post-operative intracranial hematoma; and s/p trach and gastrostomy tube.      Met with MPOA daughter [name] and daughter April in person to discuss goals of care.  Discussed in hallway outside unit at their request.  [name] was able to speak with Dr. Margart Sickles this morning.  At this time, family is now very doubtful that Ms. Greb will make any significant neurologic recovery to a degree that she would consider an acceptable quality of life.  They wish to all speak as a family to decide how to proceed and wished to discuss what forgoing life-prolonging measures would look like, and instead focusing on CMO.  If they decide to transition to [facility], they do not wish to have any life-prolonging measures.  I discussed that this would entail:  -humidified medical air via trach collar with no escalation to supplemental O2, ventilator, or CPAP assistance  -stopping tube feeds  -stopping IV antibiotics  - stopping labs and imaging studies  - stopping IV fluids  -using medications to treat any symptoms of discomfort such as pain or dyspnea.    I also discussed current visiting policy if she were to transition to [facility].  Disposition of possible SNF with hospice versus inpatient hospice (and it's criteria) were discussed.      1.  Continue DNR with no treatment limitations at this time.  2.  If family elects to pursue CMO, please see limitations as outlined above  3.  If family elects to pursue CMO, consider:  -Morphine 467m IV q 30 min PRN pain or dyspnea  -If multiple prn doses of morphine are required over a shift, consider starting infusion (ex. morphine  2667mhr)  -Titrate up PRN dose as needed for comfort, by 50%-100% increases at a time  -Can titrate up morphine infusion rate by 50%-100% every several hours if needed for comfort  -glycopyrrolate 0.67m34mV q 4 hr prn excessive secretions  -lorazepam 1mg70m q 4 hr agitation or anxiety  -acetaminophen prn  -continue anti-epileptic medications    If she is CMO and stable for discharge with hospice, can use:  Morphine concentrate 3mg 56m GT q 1 hr prn pain or dyspnea  Scopolamine patch if needed for excessive secretions  Lorazepam liquid concentrate 1mg p27mGT q 4 hr prn  Continue anti-epileptic medications  Consider completion of POST form    4. Renal:  5. GI:    6.  Cardiac:  QTc  7.  Goals of Care:        Pain: PainAD  0    POST completed:  POST form not discussed    Subjective:     No acute events overnight.  Per Neurosurgery, VP shunt not being placed.            Objective:  Temperature: 36.1 C (97 F)  Heart Rate: 80  BP (Non-Invasive): (!) 142/77  Respiratory Rate: 18  SpO2: 100 %    General: appears older than age, laying in bed  Eyes: Conjunctiva clear, Sclera non-icteric, no exudate noted   HENT: trachea in place attached to ATC, mild thin clear  to yellow secretions  Lungs: spontaneous respirations; unlabored; on 10LPM atc  Cardiovascular: RRR  Extremities: edema ble  Skin: Skin warm and dry, No rashes  Neurologic: not following commands, withdraws from pain in all 4 extremities          Labs:    I have reviewed all lab results.  Lab Results Today:    Results for orders placed or performed during the hospital encounter of 01/08/19 (from the past 24 hour(s))   LACTIC ACID LEVEL   Result Value Ref Range    LACTIC ACID 1.1 0.5 - 2.2 mmol/L   TYPE AND SCREEN   Result Value Ref Range    UNITS ORDERED NOT STATED         ABO/RH(D) O POSITIVE     ANTIBODY SCREEN NEGATIVE     SPECIMEN EXPIRATION DATE 22/29/7989    BASIC METABOLIC PANEL   Result Value Ref Range    SODIUM 143 136 - 145 mmol/L    POTASSIUM 2.7 (LL) 3.5 -  5.1 mmol/L    CHLORIDE 102 96 - 111 mmol/L    CO2 TOTAL 32 22 - 32 mmol/L    ANION GAP 9 4 - 13 mmol/L    CALCIUM 9.0 8.5 - 10.2 mg/dL    GLUCOSE 101 65 - 139 mg/dL    BUN 20 8 - 25 mg/dL    CREATININE 0.52 0.49 - 1.10 mg/dL    BUN/CREA RATIO 38 (H) 6 - 22    ESTIMATED GFR >60 >60 mL/min/1.42m2   MAGNESIUM   Result Value Ref Range    MAGNESIUM 2.0 1.6 - 2.6 mg/dL   PHOSPHORUS   Result Value Ref Range    PHOSPHORUS 3.3 2.4 - 4.7 mg/dL   LACTIC ACID LEVEL   Result Value Ref Range    LACTIC ACID 0.9 0.5 - 2.2 mmol/L   CBC WITH DIFF   Result Value Ref Range    WBC 6.4 3.7 - 11.0 x10^3/uL    RBC 3.07 (L) 3.85 - 5.22 x10^6/uL    HGB 8.8 (L) 11.5 - 16.0 g/dL    HCT 27.8 (L) 34.8 - 46.0 %    MCV 90.6 78.0 - 100.0 fL    MCH 28.7 26.0 - 32.0 pg    MCHC 31.7 31.0 - 35.5 g/dL    RDW-CV 15.3 11.5 - 15.5 %    PLATELETS 222 150 - 400 x10^3/uL    MPV 10.6 8.7 - 12.5 fL    NEUTROPHIL % 58 %    LYMPHOCYTE % 27 %    MONOCYTE % 10 %    EOSINOPHIL % 3 %    BASOPHIL % 1 %    NEUTROPHIL # 3.85 1.50 - 7.70 x10^3/uL    LYMPHOCYTE # 1.72 1.00 - 4.80 x10^3/uL    MONOCYTE # 0.61 0.20 - 1.10 x10^3/uL    EOSINOPHIL # 0.19 <=0.50 x10^3/uL    BASOPHIL # <0.10 <=0.20 x10^3/uL    IMMATURE GRANULOCYTE % 1 0 - 1 %    IMMATURE GRANULOCYTE # <0.10 <0.10 x10^3/uL   LACTIC ACID LEVEL   Result Value Ref Range    LACTIC ACID 0.6 0.5 - 2.2 mmol/L   POTASSIUM   Result Value Ref Range    POTASSIUM 3.5 3.5 - 5.1 mmol/L         Imaging Studies:  Images and Reports of  MRI brain  01/10/2019 reviewed to current date.      Consultant:  JPricilla Riffle MD  Total Time of Encounter:  50 min,  >50% spent in counseling and coordination of care        Pricilla Riffle, MD

## 2019-01-11 LAB — CBC WITH DIFF
BASOPHIL #: <0.1 10*3/uL (ref <=0.20)
BASOPHIL %: 1 %
EOSINOPHIL #: 0.3 10*3/uL (ref <=0.50)
EOSINOPHIL %: 5 %
HCT: 27.2 % — ABNORMAL LOW (ref 34.8–46.0)
HGB: 8.6 g/dL — ABNORMAL LOW (ref 11.5–16.0)
IMMATURE GRANULOCYTE #: <0.1 10*3/uL (ref <0.10)
IMMATURE GRANULOCYTE %: 1 % (ref 0–1)
LYMPHOCYTE #: 1.14 10*3/uL (ref 1.00–4.80)
LYMPHOCYTE %: 19 %
MCH: 28.5 pg (ref 26.0–32.0)
MCHC: 31.6 g/dL (ref 31.0–35.5)
MCV: 90.1 fL (ref 78.0–100.0)
MONOCYTE #: 0.52 10*3/uL (ref 0.20–1.10)
MONOCYTE %: 9 %
MPV: 9.9 fL (ref 8.7–12.5)
NEUTROPHIL #: 3.89 10*3/uL (ref 1.50–7.70)
NEUTROPHIL %: 65 %
PLATELETS: 242 10*3/uL (ref 150–400)
RBC: 3.02 10*6/uL — ABNORMAL LOW (ref 3.85–5.22)
RDW-CV: 15.1 % (ref 11.5–15.5)
WBC: 6 10*3/uL (ref 3.7–11.0)

## 2019-01-11 LAB — BASIC METABOLIC PANEL
ANION GAP: 8 mmol/L (ref 4–13)
BUN/CREA RATIO: 31 — ABNORMAL HIGH (ref 6–22)
BUN: 14 mg/dL (ref 8–25)
CALCIUM: 8.6 mg/dL (ref 8.5–10.2)
CHLORIDE: 106 mmol/L (ref 96–111)
CO2 TOTAL: 29 mmol/L (ref 22–32)
CREATININE: 0.45 mg/dL — ABNORMAL LOW (ref 0.49–1.10)
ESTIMATED GFR: >60 mL/min/{1.73_m2} (ref >60)
GLUCOSE: 107 mg/dL (ref 65–139)
POTASSIUM: 3 mmol/L — ABNORMAL LOW (ref 3.5–5.1)
SODIUM: 143 mmol/L (ref 136–145)

## 2019-01-11 LAB — PHOSPHORUS: PHOSPHORUS: 3 mg/dL (ref 2.4–4.7)

## 2019-01-11 LAB — PHENYTOIN, FREE: FREE PHENYTOIN LEVEL: 1.6 ug/mL (ref 1.0–2.0)

## 2019-01-11 LAB — CATHETER TIP CULTURE: CATHETER TIP CULTURE: <15 — AB

## 2019-01-11 LAB — MAGNESIUM: MAGNESIUM: 1.8 mg/dL (ref 1.6–2.6)

## 2019-01-11 LAB — LACOSAMIDE (VIMPAT): LACOSAMIDE (VIMPAT): 4.6 ug/mL (ref 1.0–10.0)

## 2019-01-11 MED ORDER — DIAZEPAM 5 MG/5 ML (1 MG/ML, 5 ML) ORAL SOLUTION
5.00 mg | Freq: Two times a day (BID) | ORAL | Status: DC
Start: 2019-01-11 — End: 2019-01-15
  Administered 2019-01-11 – 2019-01-15 (×9): 5 mg via GASTROSTOMY
  Filled 2019-01-11 (×9): qty 5

## 2019-01-11 MED ORDER — DIAZEPAM 5 MG/ML INJECTION SYRINGE
5.0000 mg | INJECTION | INTRAMUSCULAR | Status: DC
Start: 2019-01-11 — End: 2019-01-11

## 2019-01-11 MED ORDER — POTASSIUM BICARBONATE-CITRIC ACID 20 MEQ EFFERVESCENT TABLET
60.0000 meq | EFFERVESCENT_TABLET | Freq: Once | ORAL | Status: AC
Start: 2019-01-11 — End: 2019-01-11
  Administered 2019-01-11: 60 meq via GASTROSTOMY
  Filled 2019-01-11: qty 3

## 2019-01-11 MED ORDER — POTASSIUM CHLORIDE 10 MEQ/100ML IN STERILE WATER INTRAVENOUS PIGGYBACK
10.0000 meq | INJECTION | INTRAVENOUS | Status: DC
Start: 2019-01-11 — End: 2019-01-11
  Filled 2019-01-11 (×4): qty 100

## 2019-01-11 MED ORDER — MAGNESIUM OXIDE 400 MG (241.3 MG MAGNESIUM) TABLET
400.0000 mg | ORAL_TABLET | Freq: Two times a day (BID) | ORAL | Status: AC
Start: 2019-01-11 — End: 2019-01-11
  Administered 2019-01-11 (×2): 400 mg via GASTROSTOMY
  Filled 2019-01-11 (×2): qty 1

## 2019-01-11 MED ADMIN — amino ac-protein hydro-whey protein 10 gram-100 kcal/30 mL oral liquid: GASTROSTOMY | @ 22:00:00

## 2019-01-11 MED ADMIN — levETIRAcetam 500 mg tablet: GASTROSTOMY | @ 22:00:00

## 2019-01-11 MED ADMIN — ipratropium 0.5 mg-albuteroL 3 mg (2.5 mg base)/3 mL nebulization soln: RESPIRATORY_TRACT | @ 21:00:00

## 2019-01-11 MED ADMIN — polyethylene glycol 3350 17 gram oral powder packet: GASTROSTOMY | @ 08:00:00

## 2019-01-11 MED ADMIN — ipratropium 0.5 mg-albuteroL 3 mg (2.5 mg base)/3 mL nebulization soln: RESPIRATORY_TRACT | @ 08:00:00

## 2019-01-11 MED ADMIN — senna leaf extract 176 mg/5 mL oral syrup: GASTROSTOMY | @ 08:00:00

## 2019-01-11 MED ADMIN — lacosamide 100 mg tablet: GASTROSTOMY | @ 22:00:00

## 2019-01-11 MED ADMIN — Medication: INTRAVENOUS | @ 17:00:00

## 2019-01-11 MED ADMIN — Medication: INTRAVENOUS | @ 04:00:00

## 2019-01-11 MED ADMIN — enoxaparin 40 mg/0.4 mL subcutaneous syringe: SUBCUTANEOUS | @ 22:00:00

## 2019-01-11 MED ADMIN — senna leaf extract 176 mg/5 mL oral syrup: GASTROSTOMY | @ 22:00:00

## 2019-01-11 MED ADMIN — potassium bicarbonate-citric acid 20 mEq effervescent tablet: GASTROSTOMY | @ 11:00:00

## 2019-01-11 MED ADMIN — diazePAM 5 mg/5 mL (1 mg/mL, 5 mL) oral solution: GASTROSTOMY | @ 11:00:00

## 2019-01-11 MED ADMIN — levETIRAcetam 500 mg tablet: GASTROSTOMY | @ 08:00:00

## 2019-01-11 MED ADMIN — lacosamide 100 mg tablet: GASTROSTOMY | @ 08:00:00

## 2019-01-11 MED ADMIN — famotidine 20 mg tablet: GASTROSTOMY | @ 08:00:00

## 2019-01-11 MED ADMIN — cloBAZam 10 mg tablet: GASTROSTOMY | @ 07:00:00

## 2019-01-11 MED ADMIN — Medication: INTRAVENOUS | @ 19:00:00

## 2019-01-11 MED ADMIN — amino ac-protein hydro-whey protein 10 gram-100 kcal/30 mL oral liquid: GASTROSTOMY | @ 09:00:00

## 2019-01-11 MED ADMIN — famotidine 20 mg tablet: GASTROSTOMY | @ 22:00:00

## 2019-01-11 MED ADMIN — cloBAZam 10 mg tablet: GASTROSTOMY | @ 22:00:00

## 2019-01-11 MED ADMIN — magnesium oxide 400 mg (241.3 mg magnesium) tablet: GASTROSTOMY | @ 22:00:00

## 2019-01-11 MED ADMIN — phenytoin 50 mg chewable tablet: GASTROSTOMY | @ 22:00:00

## 2019-01-11 MED ADMIN — phenytoin 50 mg chewable tablet: GASTROSTOMY | @ 07:00:00

## 2019-01-11 NOTE — Progress Notes (Signed)
[facility]  Medicine Progress Note  No CPR    [name]  Date of service: 01/11/2019    Subjective:   No acute events were reported overnight. Patient remained afebrile and hemodynamically stable. However, she was again biting her tongue with the jaw clenched during the night, and neurology was called due to the concern for seizures. Her neuro exam remained unchanged and no EEG was recommended given the lack of seizure evidence on the video EEG that was obtained initially upon admission. The repeat serum Dilantin level was normal and the Dilantin was restarted overnight. This morning when we examined the patient at bedside, she had the jaw clenched with her tongue bitten by her teeth. We asked the RT to place a bite block to prevent the patient from tongue biting and bleeding. The patient will e monitored given that there is still a risk of biting the block and aspirating it. I spoke to the daughter over the phone and she stated that patient had a bite block back in January and was discharged to the [facility] with it. The other 2 daughters plan to visit the patient today before they decide whether to proceed with CMO or not. Per the nurse, they are waiting on a couple of other family members to visit the patient today before most likely proceeding with CMO tomorrow.    Vital Signs:  Temp (24hrs) Max:36.4 C (85.4 F)      Systolic (62VOJ), JKK:938 , Min:108 , HWE:993     Diastolic (71IRC), VEL:38, Min:58, Max:82    Temp  Avg: 36.1 C (96.9 F)  Min: 35.6 C (96.1 F)  Max: 36.4 C (97.5 F)  MAP (Non-Invasive)  Avg: 82.9 mmHG  Min: 73 mmHG  Max: 96 mmHG  Pulse  Avg: 83.9  Min: 80  Max: 91  Resp  Avg: 15.6  Min: 12  Max: 21  SpO2  Avg: 100 %  Min: 100 %  Max: 100 %     Fi02    I/O:  I/O last 24 hours:      Intake/Output Summary (Last 24 hours) at 01/11/2019 0741  Last data filed at 01/11/2019 0447  Gross per 24 hour   Intake 350 ml   Output 1735 ml   Net -1385 ml     I/O current shift:  No intake/output  data recorded.  Blood Sugars: 107    cloBAZam (ONFI) tablet, 20 mg, Gastric (NG, OG, PEG, GT), Q8H  enoxaparin PF (LOVENOX) 40 mg/0.4 mL SubQ injection, 40 mg, Subcutaneous, Q24H  famotidine (PEPCID) tablet, 20 mg, Gastric (NG, OG, PEG, GT), 2x/day  ipratropium-albuterol 0.5 mg-3 mg(2.5 mg base)/3 mL Solution for Nebulization, 3 mL, Nebulization, 4x/day  lacosamide (VIMPAT) tablet, 200 mg, Gastric (NG, OG, PEG, GT), 2x/day  levETIRAcetam (KEPPRA) tablet, 1,500 mg, Gastric (NG, OG, PEG, GT), 2x/day  LORazepam (ATIVAN) 2 mg/mL injection ---Cabinet Override, , ,   magnesium oxide (MAG-OX) tablet, 400 mg, Gastric (NG, OG, PEG, GT), 2x/day  NS flush syringe, 2 mL, Intracatheter, Q8HRS    And  NS flush syringe, 2-6 mL, Intracatheter, Q1 MIN PRN  nutrition protein supplement 15 g per 30 mL packet, 1 Packet, Gastric (NG, OG, PEG, GT), 2x/day  phenytoin (DILANTIN) chewable tablet, 100 mg, Gastric (NG, OG, PEG, GT), Q8HRS  polyethylene glycol (MIRALAX) oral packet, 17 g, Gastric (NG, OG, PEG, GT), Daily  potassium chloride 10 mEq in SW 100 mL premix infusion, 10 mEq, Intravenous, Q1H  senna concentrate (SENNA) 528mg  per 2mL  oral liquid, 10 mL, Gastric (NG, OG, PEG, GT), 2x/day  vancomycin (VANCOCIN) 1,250 mg in NS 250 mL IVPB, 18 mg/kg (Adjusted), Intravenous, Q12H  Vancomycin IV - Pharmacist to Dose per Protocol, , Does not apply, Daily PRN        No Known Allergies    Physical Exam:  General: Appears stated age, chronically and acutely ill, in no acute distress  Respiratory: Increased breath sounds bilaterally, no crackles or wheezing   Cardiovascular: RRR, S1/S2 normal, no murmur  Gastrointestinal:  bowel sounds normal, abdomen soft, nontender to palpation  Musculoskeletal: Left temporal edema  Neurologic: (limited due to unresponsiveness) awake, unable to follow commands or answer questions, pupils sluggishly reactive to light, jaw clenched with tongue bitten, bedridden; withdraws all extremities to painful  stimuli  Mental status: unable to assess due to unresponsiveness     Labs:  I have reviewed all labs. 6/20  CBC grossly unchanged, stable normocytic anemia  BMP K 3.0    Radiology:    CT LAB BRAIN WO IV CONTRAST   Final Result      MRI BRAIN W/WO CONTRAST   Final Result   1.   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   2.  Unchanged dilation of the left lateral ventricle especially the   temporal horn. There is unchanged herniation of the left hemisphere   especially the temporal lobe through the craniectomy defect. These findings   are however worse than the prior CT of August 04, 2018.         XR AP MOBILE CHEST   Final Result   Suspect retrocardiac atelectasis/consolidation.               Microbiology:    [facility] (6/17) grew Staph epi  MRSA screen Positive  [facility] (6/18) grew E fecalis  Legionella and strep antigen negative    Consults: Neurology, Neurosurgery, Supportive care    Hardware (lines, foley's, tubes):   Patient Lines/Drains/Airways Status    Active Line / Dialysis Catheter / Dialysis Graft / Drain / Airway / Wound     Name: Placement date: Placement time: Site: Days:    Peripheral IV Ultrasound guided;Extended dwell catheter Left;Lower Arm  01/09/19   0300   2    Peripheral IV Ultrasound guided;Extended dwell catheter Right;Lower Arm  01/09/19   0300   2    Gastrostomy Tube Center  07/15/18   --   180    Foley Catheter  01/09/19   0040   2    Tracheostomy 6 Cuffed  07/15/18   1241   179    Wound (Non-Surgical) Perineum;Groin;Thigh  07/19/18   0800   175    Wound (Non-Surgical) Right Buttock;Thigh  01/09/19   0400   2                Assessment/ Plan:   Active Hospital Problems    Diagnosis   . Primary Problem: Seizure (CMS [place])   . 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 PEG (percutaneous endoscopic gastrostomy) status (CMS [facility])   . Chronic respiratory failure (CMS [facility])   . Vegetative state (CMS [place])   . CAP (community acquired pneumonia)   . Hypokalemia   . 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 Now presenting with episodes of tongue biting and clenched jaw initially concerning for status epilepticus. Patient is currently onmultipleof AEDs that were previously started for multiple clinical and subclinical seizures on vEEG. During this admission, video EEG was negative for seizures. Patient was also suspected to have pneumonia and was found to have Staph epi bacteremia as well as E fecalis UTI and continues treatment with IV vancomycin. She was seen by neurology while in the ICU. Her serum Dilantin level was elevated, so Dilantin was initially held.  Patient was transferred to step-down on 06/19.  She was noted to have recurring episodes of tongue biting and jaw clenching.  Repeat serum Dilantin level was normal, so Dilantin was restarted.  We also started p.o. Valium 5 mg b.i.d. Per Neurology recommendations.  Patient was placed a bite block to prevent her from biting the tongue and avoid bleeding.  Supportive care was consulted for communication with family and goals of care clarification.  Patient was made DNR/DNI. Family discussed with supportive care about the option of CMO.  Patient's daughters and other family members would like to visit her, and are most likely planning to proceed with CMO tomorrow.    Tongue biting and jaw clenching  Hx of L tentorial meningioma s/p resection complicated by hemorrhage and evacuation  Left hemisphere herniation through the craniectomy  defect  Semi-vegetative state, s/p trach and PEG  - neurology was consulted, etiology likely nonepileptic as video EEG did not show any seizures  - video EEG during this admission showed near continuous central/parietal periodic 1 Hz discharges with no definite seizures or clinical correlate during these discharges  - will continue home anti-epileptics, including:     -- Keppra1500 mg b.i.d.   -- Vimpat200 mg b.i.d.   -- Dilantin100 mg q8h (resumed today after repeated Dilantin level normal)  -- Clobazam (Onfi)20 mg q8h  - added Diazepam 5mg  b.i.d. Per Neurology recommendations  - we will continue seizure precautions and q.4 hours neuro checks  - Bite block in place  - MRI brain during this admission showed worsening herniation of the left hemisphere through the craniectomy defect when compared to CT from August 04, 2018  - neurosurgery was consulted, no surgical intervention at this time  - supportive care was consulted for goals of care discussion; family decided to proceed with DNR/DNI, however would like to visit her first before finally proceeding with CMO, likely on Sunday 6/21    Staph epi bacteremia  E fecalis UTI  - BC, urine cultures with susceptibilities and imaging were reviewed as above  - continue treatment with IV vancomycin per pharmacy dosing and trough  - continue duo-nebs QID    Hypokalemia  - K 3.0 this AM  - Replaced with 60 mEq Effer-K  - Will repeat BMP in AM    DVT/PE Prophylaxis: enoxaparin    MNT PROTOCOL FOR DIETITIAN  ADULT TUBE FEED - BOLUS TID NO MEALS, TF ONLY; OSMOLITE 1.5; OG; BOLUS; Bolus Amount: 1 Can  DIET NPO - SPECIFIC DATE & TIME INCLUDING TUBE FEEDS     Disposition Planning: TBD    [place]  PGY 1, Neurology  01/11/2019, 07:41        I saw and examined the patient.  I reviewed the  resident's note.  I agree with the findings and plan of care as documented in the resident's note.  Any exceptions/additions are  edited/noted.    Sindy Guadeloupe, MD 01/11/2019, [phone]  Department of Internal Medicine  [facility]

## 2019-01-11 NOTE — Respiratory Therapy (Signed)
Pt seen for pulmonary evaluation. Pt trached on 28% ATC. Spo2 high 90's. BS clear/diminished. Suctioned trach thick moderate pale creamy yellow secretions. Receiving scheduled duoneb treatments.

## 2019-01-11 NOTE — Consults (Signed)
[facility]  Neurology Consult Follow Up    [name], 58 y.o. female  Date of Service: 01/11/2019  Date of Birth:  September 12, 1960    Hospital Day:  LOS: 2 days       Chief Complaint:  Jaw clenching  Subjective: Overnight, patient was having tongue biting episodes. Assessed by neurology and reviewed extended EEG from yesterday for 24 hours which did not show any seizures. 2mg  ativan was given and advised to restart dilantin as repeat level was normal.    Objective:  Temperature: 36.2 C (97.2 F)  Heart Rate: 90  BP (Non-Invasive): 123/71  Respiratory Rate: 16  SpO2: 100 %  [place]: Eye opening:  3 to speech, Verbal response:  T Intubated, Best motor response:  5 moved to localized pain  General: appears chronically ill  PJA:SNKN without erythema or injection, mucous membranes moist.  Neck: no thyromegaly or lymphadenopathy  Carotids:Carotids normal without bruit  Respiratory: Clear to auscultation bilaterally.   Cardiovascular: regular rate and rhythm  Gastrointestinal: Soft, non-tender  Genitourinary: Deferred  Lymphatic/Immunologic/Hematologic: No lymphadenopathy  Psychiatric: Unable to assess due to vegetative state   Musculoskeletal: Left temporal swelling (5 cm diameter)  Ophthalomscopic: Poorly visualized fundus in a non dilated exam   Glasgow:  Eye opening:  4 spontaneous, Verbal response:  1 no response, Best motor response:  5 moved to localized pain  Mental status: unable to assess due to unresponsiveness   Level of Consciousness: alert  Orientations:   Memory unable to assess due to unresponsiveness   Attentions unable to assess due to unresponsiveness   Knowledge:  unable to assess due to unresponsiveness   Language:  unable to assess due to unresponsiveness   Speech:  unable to assess due to unresponsiveness   Cranial nerves:   CN2: Visual acuity and fields intact and Right visual field deficit:  upper and lower  CN 3,4,6: Pupils: equal and sluggish and ophthalmoparesis:   right  CN  5Corneal reflex:  Normal  CN 7Face symmetrical  CN 8: unable to assess due to unresponsiveness   CN 9,10: Palate symmetric and gag normal and Gag reflex:  normal  CN 11:  unable to assess due to unresponsiveness   CN 12: protruded tongue, no fasciculations   Gait, Coordination, and Reflexes:   Gait: bedridden  Coordination:  unable to assess due to unresponsiveness     Muscle tone: flaccid  Muscle exam: withdrawing to pain in all 4 extremities     Reflexes:   RJ BJ TJ KJ AJ Plantars Hoffman's   Right 4+ 4+ 4+ 3+ 3+ Upgoing present   Left 2+ 2+ 2+ 2+ 2+ Downgoing Not present     Sensory: Withdrawing to tactile stimuli throughout   Integumentary: Skin warm and dry  Diabetes Monitors:   FOOTEXAM: Both feet without edema or ulcerations. Pulses normal bilaterally. Sensation normal bilaterally        Current Comorbid Conditions:  -None of the following conditions apply  Compression of the Brain:  no  Coma less than 8:  Coma -Not applicable  Respiratory Failure/Other-Not applicable  No  Coagulopathy Not applicable  N/A    Labs:    I have reviewed all lab results.  Lab Results Today:    Results for orders placed or performed during the hospital encounter of 01/08/19 (from the past 24 hour(s))   LACTIC ACID LEVEL   Result Value Ref Range    LACTIC ACID 0.6 0.5 - 2.2 mmol/L   POTASSIUM  Result Value Ref Range    POTASSIUM 3.5 3.5 - 5.1 mmol/L   PHENYTOIN   Result Value Ref Range    PHENYTOIN LEVEL 15.4 10.0 - 20.0 ug/mL   PHENYTOIN, FREE   Result Value Ref Range    FREE PHENYTOIN LEVEL 1.6 1.0 - 2.0 ug/mL   BASIC METABOLIC PANEL   Result Value Ref Range    SODIUM 143 136 - 145 mmol/L    POTASSIUM 3.0 (L) 3.5 - 5.1 mmol/L    CHLORIDE 106 96 - 111 mmol/L    CO2 TOTAL 29 22 - 32 mmol/L    ANION GAP 8 4 - 13 mmol/L    CALCIUM 8.6 8.5 - 10.2 mg/dL    GLUCOSE 107 65 - 139 mg/dL    BUN 14 8 - 25 mg/dL    CREATININE 0.45 (L) 0.49 - 1.10 mg/dL    BUN/CREA RATIO 31 (H) 6 - 22    ESTIMATED GFR >60 >60 mL/min/1.55m^2   PHOSPHORUS      Result Value Ref Range    PHOSPHORUS 3.0 2.4 - 4.7 mg/dL   MAGNESIUM   Result Value Ref Range    MAGNESIUM 1.8 1.6 - 2.6 mg/dL   CBC WITH DIFF   Result Value Ref Range    WBC 6.0 3.7 - 11.0 x10^3/uL    RBC 3.02 (L) 3.85 - 5.22 x10^6/uL    HGB 8.6 (L) 11.5 - 16.0 g/dL    HCT 27.2 (L) 34.8 - 46.0 %    MCV 90.1 78.0 - 100.0 fL    MCH 28.5 26.0 - 32.0 pg    MCHC 31.6 31.0 - 35.5 g/dL    RDW-CV 15.1 11.5 - 15.5 %    PLATELETS 242 150 - 400 x10^3/uL    MPV 9.9 8.7 - 12.5 fL    NEUTROPHIL % 65 %    LYMPHOCYTE % 19 %    MONOCYTE % 9 %    EOSINOPHIL % 5 %    BASOPHIL % 1 %    NEUTROPHIL # 3.89 1.50 - 7.70 x10^3/uL    LYMPHOCYTE # 1.14 1.00 - 4.80 x10^3/uL    MONOCYTE # 0.52 0.20 - 1.10 x10^3/uL    EOSINOPHIL # 0.30 <=0.50 x10^3/uL    BASOPHIL # <0.10 <=0.20 x10^3/uL    IMMATURE GRANULOCYTE % 1 0 - 1 %    IMMATURE GRANULOCYTE # <0.10 <0.10 x10^3/uL       Review of reports and notes reveal:     Independent Interpretation of images or specimens:  CT head wo IV contrast   Post operative changes on the left which is associated with interval increase in brain herniation through the craniotomy with secondary ventricular dilation. No hemorrhage or other acute process.    Assessment/Recommendations:  [name] Monningeris a 58 y.o.,Whitefemalewith history of left tentorial meningioma and a filling deficit in the left transverse sinus concerning for chronic venous thrombusnow s/p lefttemporal craniotomy for resection of left tentorial meningioma (07/06/18),complicated by tumor bed hemorrhage s/p L fronto-temporal craniectomy with evacuation.  Neurology is consulted forevaluation of concern for status epilepticus in a patient with jaw clenching episodes in the setting of possible pneumonia. She is currently on multipleof AEDs that were started for multiple clinical seizures and subclinical seizures on vEEG.    Spell-Tongue biting  Etiology likely nonepileptic as video EEG did not show any seizures  - Recommend Q4H Neuro  checks  - Recommend seizure precautions   - Discontinued video EEG  - Recommend continuing AEDs as  prescribed  -- levetiracetam (Keppra)1500 mg 2x/day   -- Lacosamide (Vimpat)200 mg 2x/day   -- phenytoin (Dilantin)100 mg q8h   -- Clobazam (Onfi)20 mg q8h                -- Please restart patient's home Dizepam 5 mg BID    - Please continue medical management of underlying medical issues that can serve as provoking factors for the patient's known epilepsy   - Please avoid medications that can lower seizure threshold such as Wellbutrin, Ultram, Lithium, Lidocaine IV, Quinolones, Imipenem or Cefepime     Thank you for the consult. Will sign off at this time. Please page with any questions or concerns.    Yvonna Alanis, MD 01/11/2019 08:06  PGY-3 Neurology  Pager [phone]      I saw and examined the patient.  I reviewed the resident's note.  I agree with the findings and plan of care as documented in the resident's note.  Any exceptions/additions are edited/noted.    Seward Grater, MD

## 2019-01-11 NOTE — Care Plan (Signed)
Patient remained stable during the day. No signs/symptoms of pain. Remains unable to follow any commands but does open eyes to voice. Patient continuing to bite tongue. Permission obtained from house supervisor and service for bite block. No observed wounds or trauma at time of insertion per RT. Lower tooth noticed to be loose later this evening. Bite block repositioned to avoid pressure on tooth. Valium added. Daughter at bedside. Service and nursing updated family via phone and at bedside of need to protect tongue with bite block and updates. ATC continued and oxygen saturations remain high. Plan is for family to switch patient to [place] and discuss plans with supportive care tmrw. Patient continues to have large amounts of oral secretions and yaunker used. Patient turned every two hours and checked for incontinence. Foley catheter continued. Will continue to monitor.   Problem: Communication Impairment (Mechanical Ventilation, Invasive)  Goal: Effective Communication  Outcome: Ongoing (see interventions/notes)     Problem: Device-Related Complication Risk (Mechanical Ventilation, Invasive)  Goal: Optimal Device Function  Outcome: Ongoing (see interventions/notes)     Problem: Inability to Wean (Mechanical Ventilation, Invasive)  Goal: Mechanical Ventilation Liberation  Outcome: Ongoing (see interventions/notes)     Problem: Nutrition Impairment (Mechanical Ventilation, Invasive)  Goal: Optimal Nutrition Delivery  Outcome: Ongoing (see interventions/notes)     Problem: Skin and Tissue Injury (Mechanical Ventilation, Invasive)  Goal: Absence of Device-Related Skin and Tissue Injury  Outcome: Ongoing (see interventions/notes)     Problem: Ventilator-Induced Lung Injury (Mechanical Ventilation, Invasive)  Goal: Absence of Ventilator-Induced Lung Injury  Outcome: Ongoing (see interventions/notes)     Problem: Skin Injury Risk Increased  Goal: Skin Health and Integrity  Outcome: Ongoing (see interventions/notes)        Problem: Seizure, Active Management  Goal: Absence of Seizure/Seizure-Related Injury  Outcome: Ongoing (see interventions/notes)     Problem: COVID-19 Confirmed or Rule-Out  Description: Resident requires specific care to avoid complications secondary to COVID-19  Goal: Resident will remain free of complications due to QBHAL-93  Description: 1. Review and update the resident's isolation status in the Isolation activity  2. Keep the resident's door closed at all times and limit movement of the resident outside of the room to medically essential purposes. If applicable, transfer resident to a single-person room   3. Educate and reinforce infection prevention and control practices recommended by CDC  4. Frequently monitor for development of more severe symptoms   5. Use appropriate PPE when providing care for resident  6. Reinforce no visitor policy and non-essential health care personnel policy, except for certain compassionate care situations  7. If worsening of symptoms occur, alert the nearest Hospital caring for confirmed COVID-19 patients and arrange for transfer with proper precautions including placing a facemask on the resident during transfer  8. Communicate information about known or suspected case of COVID-19 to appropraite public health personnel  9. Avoid procedures that are likely to induce coughing (e.g., sputum induction, open suctioning of airways). If required, do so in a Airborne Infection Isolation Room. The health care provider in the room should wear an N95 or higher-level respirator, eye protection, gloves, and a gown. The number of HCP present during the procedure should be limited to only those essential for resident care and procedure support. Visitors should not be present for the procedure. Clean and disinfect procedure room surfaces promptly  10. Update resident, family, and resident representatives as needed      Outcome: Outcome Achieved

## 2019-01-11 NOTE — Respiratory Therapy (Signed)
Per Dr Seth Bake Labus, device with bite blocked placed due to patient locking jaw on tongue. MD states pt wears bite block for biting of tongue baseline at prior facility. No oral trauma noted from tongue biting or placement of device. House and charge RN aware of bite block being in use. Respiratory will continue to monitor.

## 2019-01-12 LAB — BASIC METABOLIC PANEL
ANION GAP: 7 mmol/L (ref 4–13)
BUN/CREA RATIO: 26 — ABNORMAL HIGH (ref 6–22)
BUN: 12 mg/dL (ref 8–25)
CALCIUM: 9 mg/dL (ref 8.5–10.2)
CHLORIDE: 106 mmol/L (ref 96–111)
CO2 TOTAL: 30 mmol/L (ref 22–32)
CREATININE: 0.47 mg/dL — ABNORMAL LOW (ref 0.49–1.10)
ESTIMATED GFR: >60 mL/min/{1.73_m2} (ref >60)
GLUCOSE: 89 mg/dL (ref 65–139)
POTASSIUM: 3.3 mmol/L — ABNORMAL LOW (ref 3.5–5.1)
SODIUM: 143 mmol/L (ref 136–145)

## 2019-01-12 LAB — MAGNESIUM: MAGNESIUM: 1.8 mg/dL (ref 1.6–2.6)

## 2019-01-12 LAB — CBC WITH DIFF
BASOPHIL #: <0.1 10*3/uL (ref <=0.20)
BASOPHIL %: 0 %
EOSINOPHIL #: 0.22 10*3/uL (ref <=0.50)
EOSINOPHIL %: 3 %
HCT: 26.5 % — ABNORMAL LOW (ref 34.8–46.0)
HGB: 8.6 g/dL — ABNORMAL LOW (ref 11.5–16.0)
IMMATURE GRANULOCYTE #: <0.1 10*3/uL (ref <0.10)
IMMATURE GRANULOCYTE %: 1 % (ref 0–1)
LYMPHOCYTE #: 1.28 10*3/uL (ref 1.00–4.80)
LYMPHOCYTE %: 19 %
MCH: 29.1 pg (ref 26.0–32.0)
MCHC: 32.5 g/dL (ref 31.0–35.5)
MCV: 89.5 fL (ref 78.0–100.0)
MONOCYTE #: 0.53 10*3/uL (ref 0.20–1.10)
MONOCYTE %: 8 %
MPV: 9.8 fL (ref 8.7–12.5)
NEUTROPHIL #: 4.55 10*3/uL (ref 1.50–7.70)
NEUTROPHIL %: 69 %
PLATELETS: 230 10*3/uL (ref 150–400)
RBC: 2.96 10*6/uL — ABNORMAL LOW (ref 3.85–5.22)
RDW-CV: 15.1 % (ref 11.5–15.5)
WBC: 6.7 10*3/uL (ref 3.7–11.0)

## 2019-01-12 LAB — VANCOMYCIN, TROUGH: VANCOMYCIN TROUGH: 24.7 ug/mL — ABNORMAL HIGH (ref 10.0–20.0)

## 2019-01-12 LAB — PHOSPHORUS: PHOSPHORUS: 2.9 mg/dL (ref 2.4–4.7)

## 2019-01-12 MED ORDER — LORAZEPAM 2 MG/ML INJECTION SOLUTION
1.0000 mg | INTRAMUSCULAR | Status: DC | PRN
Start: 2019-01-12 — End: 2019-01-13

## 2019-01-12 MED ORDER — POTASSIUM BICARBONATE-CITRIC ACID 20 MEQ EFFERVESCENT TABLET
40.0000 meq | EFFERVESCENT_TABLET | Freq: Once | ORAL | Status: AC
Start: 2019-01-12 — End: 2019-01-12
  Administered 2019-01-12: 40 meq via GASTROSTOMY
  Filled 2019-01-12: qty 2

## 2019-01-12 MED ORDER — ACETAMINOPHEN 325 MG/10.15 ML ORAL SUSPENSION
650.0000 mg | ORAL | Status: DC | PRN
Start: 2019-01-12 — End: 2019-01-15
  Filled 2019-01-12: qty 20.3

## 2019-01-12 MED ORDER — MORPHINE 2 MG/ML INTRAVENOUS SYRINGE
1.0000 mg | INJECTION | INTRAVENOUS | Status: DC | PRN
Start: 2019-01-12 — End: 2019-01-15
  Administered 2019-01-14 – 2019-01-15 (×2): 1 mg via INTRAVENOUS
  Filled 2019-01-12 (×2): qty 1

## 2019-01-12 MED ORDER — GLYCOPYRROLATE 0.2 MG/ML INJECTION SOLUTION
200.00 ug | INTRAMUSCULAR | Status: DC | PRN
Start: 2019-01-12 — End: 2019-01-15
  Administered 2019-01-12 – 2019-01-14 (×2): 200 ug via INTRAVENOUS
  Administered 2019-01-15 (×2)
  Filled 2019-01-12 (×3): qty 1

## 2019-01-12 MED ADMIN — famotidine 20 mg tablet: GASTROSTOMY | @ 08:00:00

## 2019-01-12 MED ADMIN — lacosamide 100 mg tablet: GASTROSTOMY | @ 22:00:00

## 2019-01-12 MED ADMIN — diazePAM 5 mg/5 mL (1 mg/mL, 5 mL) oral solution: GASTROSTOMY | @ 08:00:00

## 2019-01-12 MED ADMIN — senna leaf extract 176 mg/5 mL oral syrup: GASTROSTOMY | @ 21:00:00

## 2019-01-12 MED ADMIN — famotidine 20 mg tablet: GASTROSTOMY | @ 22:00:00

## 2019-01-12 MED ADMIN — phenytoin 50 mg chewable tablet: GASTROSTOMY | @ 14:00:00

## 2019-01-12 MED ADMIN — amino ac-protein hydro-whey protein 10 gram-100 kcal/30 mL oral liquid: GASTROSTOMY | @ 08:00:00

## 2019-01-12 MED ADMIN — cloBAZam 10 mg tablet: GASTROSTOMY | @ 07:00:00

## 2019-01-12 MED ADMIN — levETIRAcetam 500 mg tablet: GASTROSTOMY | @ 22:00:00

## 2019-01-12 MED ADMIN — potassium bicarbonate-citric acid 20 mEq effervescent tablet: GASTROSTOMY | @ 10:00:00

## 2019-01-12 MED ADMIN — diazePAM 5 mg/5 mL (1 mg/mL, 5 mL) oral solution: GASTROSTOMY | @ 22:00:00

## 2019-01-12 MED ADMIN — cloBAZam 10 mg tablet: GASTROSTOMY | @ 14:00:00

## 2019-01-12 MED ADMIN — glycopyrrolate 0.2 mg/mL injection solution: INTRAVENOUS | @ 14:00:00

## 2019-01-12 MED ADMIN — sodium chloride 0.9 % (flush) injection syringe: @ 22:00:00

## 2019-01-12 MED ADMIN — Medication: INTRAVENOUS | @ 04:00:00

## 2019-01-12 MED ADMIN — lacosamide 100 mg tablet: GASTROSTOMY | @ 08:00:00

## 2019-01-12 MED ADMIN — ipratropium 0.5 mg-albuteroL 3 mg (2.5 mg base)/3 mL nebulization soln: RESPIRATORY_TRACT | @ 08:00:00

## 2019-01-12 NOTE — Code Documentation (Signed)
ADULT RAPID RESPONSE NOTE    RRT ALERT: Yes    RRT ALERT: Yes Time RRT Arrived: 0325 Source of Request: MEWS: 8+   RRT APP Present: No RT Present: Yes      MEWS Score     MEWS SCORE (READ ONLY): 8    Primary Team Notification  When Primary was Notified: [place] Notified: Primary service as per bedside nurse   Time Response: [phone]   Person who issued orders: Primary service as per bedside nurse   Patient's Primary Team Present: No orders given     Adult Rapid Response Team Event  RRT Events: Documentation Update, Bedside Intervention     Primary Concerns  Primary Concerns: Other   Comment: MEWS >8     Secondary Concerns            Subject Type: Patient    Inital Assessement: MEWS >8 - Documentation Update   Assessment: Noted MEWS page >8    Intervention: Rapid Response to bedside 8NE - 23. MEWS score demonstrated Respirations: 1 and LOC: 3 and Temp 1 and Urine 1. Upon review with bedside nurse, no acute changes noted with patient at this time; documentation update and bedside nurse and clinical assosciate checking patency of foley catheter. Noted respirations at this time are 20. Bedside nurse notes the respirations are typically 20-24. LOC: unchanged. Temperature at 2336: 36 with new temperature to be documented yet. Urine noted outputs at 1725: 300cc's, 2000: 200cc's, and currently during Rapid Response--foley drained an additional 500cc's. Last documented BP: 118/64. Will continue to monitor status. Bedside nurse to assess and notifiy primary service with any new and/or further concerns/updates.      Adult Rapid Response Team - Time Out     Time RRT Departed: [place], MSN, MBA, RN, CCRN  Rapid Response Nurse

## 2019-01-12 NOTE — Care Plan (Signed)
Patient rested comfortably throughout the night with no apparent signs of pain based off the AD pain scale. Bite block maintained by Respiratory Therapist and moved frequently to prevent breakdown. Patient remains obtunded with no purposeful movement  Vital signs and O2 sats remained stable. Sleep hygiene promoted when possible. Patient turned and repositioned q2hr. Skin / Aspiration precautions maintained. Discharge plans pending.         Problem: Communication Impairment (Mechanical Ventilation, Invasive)  Goal: Effective Communication  Outcome: Ongoing (see interventions/notes)     Problem: Device-Related Complication Risk (Mechanical Ventilation, Invasive)  Goal: Optimal Device Function  Outcome: Ongoing (see interventions/notes)     Problem: Inability to Wean (Mechanical Ventilation, Invasive)  Goal: Mechanical Ventilation Liberation  Outcome: Ongoing (see interventions/notes)     Problem: Nutrition Impairment (Mechanical Ventilation, Invasive)  Goal: Optimal Nutrition Delivery  Outcome: Ongoing (see interventions/notes)     Problem: Skin and Tissue Injury (Mechanical Ventilation, Invasive)  Goal: Absence of Device-Related Skin and Tissue Injury  Outcome: Ongoing (see interventions/notes)     Problem: Ventilator-Induced Lung Injury (Mechanical Ventilation, Invasive)  Goal: Absence of Ventilator-Induced Lung Injury  Outcome: Ongoing (see interventions/notes)     Problem: Skin Injury Risk Increased  Goal: Skin Health and Integrity  Outcome: Ongoing (see interventions/notes)     Problem: Seizure, Active Management  Goal: Absence of Seizure/Seizure-Related Injury  Outcome: Ongoing (see interventions/notes)

## 2019-01-12 NOTE — Respiratory Therapy (Signed)
Change in MEWS Patient in no respiratory distress at this time. No respiratory interventions needed,.

## 2019-01-12 NOTE — Nurses Notes (Signed)
Discussion with all daughters and Dr. Lacinda Axon today. Decision was made for patient to be CMO. Cart ordered and chaplain offered. Referral for 7W made. Patient resting comfortably at this time. Oxygen and telemetry removed. Tube feedings stopped. Will continue to monitor.

## 2019-01-12 NOTE — Progress Notes (Signed)
[facility]  Medicine Progress Note  No CPR    [name]  Date of service: 01/12/2019    Subjective: Overnight, rapid response called for high MEWS score. At this time, no changes were made. Patient remains unresponsive this morning but is comfortable. Two of her daughter were at bedside asking if she was in pain and to have discussion regarding CMO. See below for discussion. They state that she has not moaned or groaned. If not a candidate for inpatient hospice, they would like her moved to another facility and did not like her current one due to the concern that caused the blood stream infection.    Vital Signs:  Temp (24hrs) Max:36.4 C (40.9 F)      Systolic (81XBJ), YNW:295 , Min:111 , AOZ:308     Diastolic (65HQI), ONG:29, Min:64, Max:83    Temp  Avg: 36.2 C (97.2 F)  Min: 36 C (96.8 F)  Max: 36.4 C (97.5 F)  MAP (Non-Invasive)  Avg: 87 mmHG  Min: 85 mmHG  Max: 89 mmHG  Pulse  Avg: 94.3  Min: 91  Max: 97  Resp  Avg: 17  Min: 15  Max: 20  SpO2  Avg: 98.4 %  Min: 96 %  Max: 100 %     Fi02    I/O:  I/O last 24 hours:      Intake/Output Summary (Last 24 hours) at 01/12/2019 1153  Last data filed at 01/12/2019 0853  Gross per 24 hour   Intake 1020 ml   Output 1575 ml   Net -555 ml     I/O current shift:  06/21 0700 - 06/21 1859  In: 300 [GT:300]  Out: 100 [Urine:100]  Blood Sugars: 107    acetaminophen (TYLENOL) 160 mg per 5 mL liquid, 650 mg, Gastric (NG, OG, PEG, GT), Q4H PRN  cloBAZam (ONFI) tablet, 20 mg, Gastric (NG, OG, PEG, GT), Q8H  diazePAM (VALIUM) 1mg  per mL oral solution, 5 mg, Gastric (NG, OG, PEG, GT), 2x/day  famotidine (PEPCID) tablet, 20 mg, Gastric (NG, OG, PEG, GT), 2x/day  glycopyrrolate (ROBINUL) 0.2 mg/mL (200 mcg/mL) injection, 200 mcg, Intravenous, Q4H PRN  ipratropium-albuterol 0.5 mg-3 mg(2.5 mg base)/3 mL Solution for Nebulization, 3 mL, Nebulization, 4x/day  lacosamide (VIMPAT) tablet, 200 mg, Gastric (NG, OG, PEG, GT), 2x/day  levETIRAcetam (KEPPRA) tablet, 1,500  mg, Gastric (NG, OG, PEG, GT), 2x/day  LORazepam (ATIVAN) 2 mg/mL injection, 1 mg, Intravenous, Q4H PRN  morphine 2 mg/mL injection, 1 mg, Intravenous, Q4H PRN  NS flush syringe, 2 mL, Intracatheter, Q8HRS    And  NS flush syringe, 2-6 mL, Intracatheter, Q1 MIN PRN  phenytoin (DILANTIN) chewable tablet, 100 mg, Gastric (NG, OG, PEG, GT), Q8HRS  polyethylene glycol (MIRALAX) oral packet, 17 g, Gastric (NG, OG, PEG, GT), Daily  senna concentrate (SENNA) 528mg  per 92mL oral liquid, 10 mL, Gastric (NG, OG, PEG, GT), 2x/day        No Known Allergies    Physical Exam:  General: Appears stated age, chronically and acutely ill, in no acute distress; bite block in place; tracheostomy in place  Neurologic: (limited due to unresponsiveness) awake, unable to follow commands or answer questions, pupils sluggishly reactive to light, jaw clenched with tongue bitten, bedridden; withdraws all extremities to painful stimuli  Mental status: unable to assess due to unresponsiveness     Labs: K 3.3    Radiology:    CT LAB BRAIN WO IV CONTRAST   Final Result  MRI BRAIN W/WO CONTRAST   Final Result   1.   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   2.  Unchanged dilation of the left lateral ventricle especially the   temporal horn. There is unchanged herniation of the left hemisphere   especially the temporal lobe through the craniectomy defect. These findings   are however worse than the prior CT of August 04, 2018.         XR AP MOBILE CHEST   Final Result   Suspect retrocardiac atelectasis/consolidation.               Microbiology:    [facility] (6/17) grew Staph epi  MRSA screen Positive  [facility] (6/18) grew E fecalis  Legionella and strep antigen negative    Consults: Neurology, Neurosurgery, Supportive care    Hardware (lines, foley's, tubes):   Patient Lines/Drains/Airways Status    Active Line / Dialysis Catheter / Dialysis Graft / Drain / Airway / Wound     Name: Placement date: Placement time: Site: Days:     Peripheral IV Ultrasound guided;Extended dwell catheter Left;Lower Arm  01/09/19   0300   3    Peripheral IV Ultrasound guided;Extended dwell catheter Right;Lower Arm  01/09/19   0300   3    Gastrostomy Tube Center  07/15/18   --   181    Foley Catheter  01/09/19   0040   3    Tracheostomy 6 Cuffed  07/15/18   1241   180    Wound (Non-Surgical) Perineum;Groin;Thigh  07/19/18   0800   177    Wound (Non-Surgical) Right Buttock;Thigh  01/09/19   0400   3                Assessment/ Plan:   Active Hospital Problems    Diagnosis   . Primary Problem: Seizure (CMS [place])   . 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 PEG (percutaneous endoscopic gastrostomy) status (CMS [facility])   . Chronic respiratory failure (CMS [facility])   . Vegetative state (CMS [place])   . CAP (community acquired pneumonia)   . Hypokalemia   . 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 Neurosurgery found no indication for VP shunt and signed off. Neurology helped to titrate seizure medications. She has been on antibiotics to treat blood stream infection. Supportive Care consulted to help with goals of care. Patient has poor long term prognosis with little improvement in her quality of life.    Comfort Measures Only- today we discussed goals of care at bedside for 30 minutes with Breana (patient's MPOA) in person and three other sisters (two on the phone). We discussed her current conditions, prognosis, and plans to switch to Comfort Measures. We discussed stopping all medications unrelated to enhancing her comfort, as  well as what medications would be started. We discuss discontinuation of oxygen, and treatment of breathing with medications. We discussed transfer from stepdown to floor status, and transition to 7W in case she would be candidate for inpatient hospice. We will care for her direclty until she would become a  candidate for inpatient hospice. If not a candidate, would like to be discharged to another SNF or hospice house than where she came from. Everyone is in agreement at this time to transition [employer] to comfort measures only.    -discontinued all medications not related to comfort, discontinued oxygen therapy, discontinued IV antibiotics, will not start tube feed  -will continue anti-seizure medications due to risk of seizures, GERD medications  -started IV PRN morphine 1 mg q4h PRN for pain and shortness of breath, IV ativan 1 mg q4h PRN for anxiety and agitation, HR> 110, RR> 20, glycopyrrolate 200 mcg q4h PRN for secretions, liquid tylenol PRN for pain or fevers, bowel regimen continued  -will keep foley catheter in place, PETG for medication administration   -CMO order placed - transfer order to 40 W  -will evaluate patient for increased need of medications    Disposition Planning: inpatient hospice vs. SNF with hospice    Sindy Guadeloupe, MD 01/12/2019, 11:53  Department of Internal Medicine  [facility]

## 2019-01-12 NOTE — Pharmacy (Signed)
[facility] / Department of Pharmaceutical Services  Therapeutic Drug Monitoring: Vancomycin  01/12/2019      Patient name: [name], [name]  Date of Birth:  Jul 19, 1961    Actual Weight:  Weight: 76.8 kg (169 lb 5 oz) (01/10/19 0328)     BMI:       Date RPh Current regimen (including mg/kg) Indication Target Levels (mcg/mL) SCr (mg/dL) CrCl* (mL/min) Measured level (mcg/mL) Plan (including when levels are due) Comments   6/18 [name] 1500mg  (20 mg/kg adjBW) IV x 1 in ED Empiric PNA  0.51    + Zosyn   6/18 [name] S/p 1500mg  at 220 Empiric pna 10-15 0.51 >120  Start Vancomycin 1250mg  q12h  -obtain level if to be continued >72 hours    6/21 KD 1250mg  Q12h   0.47 137.7 24.7 (drawn appropriately) Hold dose & obtain another trough level at 1600 on 6/21    6/21 [name] Discontinued                                                *Creatinine clearance is estimated by using the Cockcroft-Gault equation for adult patients and the Carol Ada for pediatric patients.    The decision to discontinue vancomycin therapy will be determined by the primary service.  Please contact the pharmacist with any questions regarding this patient's medication regimen.

## 2019-01-12 NOTE — Pharmacy (Addendum)
[facility] / Department of Pharmaceutical Services  Therapeutic Drug Monitoring: Vancomycin  01/12/2019      Patient name: [name], [name]  Date of Birth:  1960-09-25    Actual Weight:  Weight: 76.8 kg (169 lb 5 oz) (01/10/19 0328)     BMI:       Date RPh Current regimen (including mg/kg) Indication Target Levels (mcg/mL) SCr (mg/dL) CrCl* (mL/min) Measured level (mcg/mL) Plan (including when levels are due) Comments   6/18 [name] 1500mg  (20 mg/kg adjBW) IV x 1 in ED Empiric PNA  0.51    + Zosyn   6/18 [name] S/p 1500mg  at 220 Empiric pna 10-15 0.51 >120  Start Vancomycin 1250mg  q12h  -obtain level if to be continued >72 hours    6/21 KD 1250mg  Q12h   0.47 137.7 24.7 (drawn appropriately) Hold dose & obtain another trough level at 1600 on 6/21                                                      *Creatinine clearance is estimated by using the Cockcroft-Gault equation for adult patients and the Carol Ada for pediatric patients.    The decision to discontinue vancomycin therapy will be determined by the primary service.  Please contact the pharmacist with any questions regarding this patient's medication regimen.

## 2019-01-12 NOTE — Nurses Notes (Signed)
Core contacted due to possibly of patient passing away this admission and switch to comfort measures only. All questions answered at this time to [facility]. Until patient is on a ventilator or passed they have no further questions. Referral number is [id number].

## 2019-01-13 DIAGNOSIS — J9601 Acute respiratory failure with hypoxia: Secondary | ICD-10-CM

## 2019-01-13 LAB — ADULT ROUTINE BLOOD CULTURE, SET OF 2 BOTTLES (BACTERIA AND YEAST)
BLOOD CULTURE, ROUTINE: ABNORMAL — CR
BLOOD CULTURE, ROUTINE: ABNORMAL — CR

## 2019-01-13 MED ORDER — LORAZEPAM 2 MG/ML INJECTION SOLUTION
1.00 mg | INTRAMUSCULAR | Status: DC | PRN
Start: 2019-01-13 — End: 2019-01-15
  Administered 2019-01-15: 1 mg via INTRAVENOUS
  Filled 2019-01-13: qty 1

## 2019-01-13 MED ADMIN — diazePAM 5 mg/5 mL (1 mg/mL, 5 mL) oral solution: GASTROSTOMY | @ 20:00:00

## 2019-01-13 MED ADMIN — famotidine 20 mg tablet: GASTROSTOMY | @ 20:00:00

## 2019-01-13 MED ADMIN — sodium chloride 0.9 % (flush) injection syringe: @ 06:00:00

## 2019-01-13 MED ADMIN — phenytoin 50 mg chewable tablet: GASTROSTOMY | @ 15:00:00

## 2019-01-13 MED ADMIN — senna leaf extract 176 mg/5 mL oral syrup: GASTROSTOMY | @ 20:00:00

## 2019-01-13 MED ADMIN — diazePAM 5 mg/5 mL (1 mg/mL, 5 mL) oral solution: GASTROSTOMY | @ 09:00:00

## 2019-01-13 MED ADMIN — lacosamide 100 mg tablet: GASTROSTOMY | @ 20:00:00

## 2019-01-13 NOTE — Nurses Notes (Addendum)
Patient resting comfortably in bed. Select scheduled medications given to promote patient comfort. Family is at bedside.

## 2019-01-13 NOTE — Consults (Signed)
[facility] Medicine Consult  Follow Up Note    [name], [name], 58 y.o. female  Date of Service: 01/13/2019  Date of Birth:  02/19/1961    Hospital Day:  LOS: 4 days     Assessment/Recommendations:  Recommend: continue comfort measures care; appears comfortable at time of interview; will discuss with patients MPOA regarding hospice options as an outpatient and evaluate daily while inpatient to continue to assess needs as currently does not meet criteria for GIP Hospice.     Interval update: 01/13/19 @ 1400   Discussed supportive care findings and examination of patient today with MPOA, Briana. Patients MPOA states that she has been working with care management services to evaluate and find placement for Hospice as an outpatient. Reviewed the role and services of [place] with Hospice vs Home hospice vs hospice house. Patients MPOA states likely to pursue nursing facility with hospice at this time but is going to evaluate the facilities and discuss with her family. Patients MPOA has no further questions at the time of interview.    Subjective: Patient laying supine in bed with HOB elevated. Accompanied by youngest daughter and ex-husband at bedside. Appears comfortable at time of interview. Will contact patients MPOA later today as she is currently resting from being bedside all night.     Objective:  Temperature: 36.3 C (97.3 F)  Heart Rate: 95  BP (Non-Invasive): (!) 159/89  Respiratory Rate: 19  SpO2: 98 %   General: laying supine in bed with HOB elevated, no acute distress noted   Eyes: Conjunctiva clear, Pupils equal and round, Sclera non-icteric   HENT: ENT without erythema or injection, mucous membranes moist  Neck: No JVD or thyromegaly or lymphadenopathy  Lungs: clear to auscultation bilaterally, no crackles, no stridor  Cardiovascular: RRR, no click, no rub   Abdomen: soft, bowel sounds normal and non-distended  Extremities: no cyanosis or edema, grossly  intact   Skin: Skin warm and dry, No rashes  Neurologic: not alert, not following commands, does not withdraw from painful stimuli (unchanged from prior)    Labs:  I have reviewed labs from this admission. Management per primary service discretion.     Imaging Studies:  Images and Reports reviewed from this admission. Management per primary service discretion.     Nita Sells, DO  Internal Medicine PGY-3  Pager [phone]     Total tome: 45 minutes  Face to face time: 30 minutes      I saw and examined the patient.  I reviewed the resident's note.  I agree with the findings and plan of care as documented in the resident's note.  Any exceptions/additions are edited/noted.    Sigurd Sos, MD

## 2019-01-13 NOTE — Nurses Notes (Signed)
Patient resting comfortably. Bite blocked moved to L side.

## 2019-01-13 NOTE — Care Plan (Signed)
[facility]  Spiritual Care Note    Patient Name:  Dimitria Ketchum  Date of Encounter:   01/13/2019       01/13/19 1210   Clinical Encounter Type   Reason for Visit Patient/Person/Family Request   Referral From RN/LPN   Declined Spiritual Care Visit At This Time No   Visited With Daughter;Other (Comment)   (Daughter Minette Brine and ex-husband [place])   Family Spiritual Encounters   Spiritual Assessment Changes related to patient's diagnosis;Grief;Uncertainty   Spiritual/Coping Resources Beliefs helpful in coping;Beliefs in God/Sacred/Higher Purpose;Prayer/devotional life;Supportive family system   Coping  Relationship building;Provided supportive presence;Offered empathy;Facilitated grief;Facilitated story telling;Explored emotions;Explored/supported faith and beliefs   Ritual  Prayer   Support Services Provided Non-anxious presence   Information Provided Spiritual Care scope of service   Spiritual Care Outcomes with Family   Spiritual/Emotional Processing Spiritual Care relationship established;Family shared/processed patient's story;Family processed emotions   Family Coping No change  (Family coping well at time of visit)   Time of Encounters   Start Time 1150   Stop Time 1210   Duration (minutes) 20 Minutes       Other Pertinent Information:  I received notice that the Patient's family were at bedside and were requesting a chaplain visit. Ms. Sanderlin ex-husband Patrick [place]) and their daughter Minette Brine) shared about the Patient's hospitalization.  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  They described her as kind and loving.  Her Dar[employer] Me[employer] faith has been very important to the Patient, and her family wanted to honor that with prayer. Per their request, I prayed with them and the Patient at bedside.  No other needs were expressed. Before excusing myself, I informed them of the 24/7 availability of chaplains for continued emotional and/or spiritual  support.     Burman Freestone, [facility]  Pager: [phone]  Total Time of Encounter: 25 min.

## 2019-01-13 NOTE — Progress Notes (Signed)
[facility]  Medicine Progress Note  No CPR    [name]  Date of service: 01/13/2019    Subjective:   This morning, when I went to check on the patient, her daughter [name] was at bedside. She stated that Mrs Sharpe appeared comfortable throughout the night. She had only required x1 Robinul for secretions. The daughter noticed a brief episode of RUE twitching and she could see the neck muscles on the right side intermittently contracting, but this episode has been transient and resolved after a few seconds. Her bite block is in place. The nurses have been changing the position of the bite block intermittently so that patient feels comfortable.      Vital Signs:  No data recorded.      Systolic (54YTK), PTW:656 , Min:146 , CLE:751     Diastolic (70YFV), CBS:49, Min:72, Max:89    MAP (Non-Invasive)  Avg: 101 mmHG  Min: 93 mmHG  Max: 109 mmHG  Resp  Avg: 19  Min: 19  Max: 19  SpO2  Avg: 98 %  Min: 98 %  Max: 98 %     Fi02    I/O:  I/O last 24 hours:      Intake/Output Summary (Last 24 hours) at 01/13/2019 0751  Last data filed at 01/13/2019 0516  Gross per 24 hour   Intake --   Output 1450 ml   Net -1450 ml     I/O current shift:  No intake/output data recorded.    acetaminophen (TYLENOL) 160 mg per 5 mL liquid, 650 mg, Gastric (NG, OG, PEG, GT), Q4H PRN  cloBAZam (ONFI) tablet, 20 mg, Gastric (NG, OG, PEG, GT), Q8H  diazePAM (VALIUM) 1mg  per mL oral solution, 5 mg, Gastric (NG, OG, PEG, GT), 2x/day  famotidine (PEPCID) tablet, 20 mg, Gastric (NG, OG, PEG, GT), 2x/day  glycopyrrolate (ROBINUL) 0.2 mg/mL (200 mcg/mL) injection, 200 mcg, Intravenous, Q4H PRN  lacosamide (VIMPAT) tablet, 200 mg, Gastric (NG, OG, PEG, GT), 2x/day  levETIRAcetam (KEPPRA) tablet, 1,500 mg, Gastric (NG, OG, PEG, GT), 2x/day  LORazepam (ATIVAN) 2 mg/mL injection, 1 mg, Intravenous, Q4H PRN  morphine 2 mg/mL injection, 1 mg, Intravenous, Q4H PRN  NS flush syringe, 2 mL, Intracatheter, Q8HRS    And  NS flush syringe, 2-6 mL,  Intracatheter, Q1 MIN PRN  phenytoin (DILANTIN) chewable tablet, 100 mg, Gastric (NG, OG, PEG, GT), Q8HRS  polyethylene glycol (MIRALAX) oral packet, 17 g, Gastric (NG, OG, PEG, GT), Daily  senna concentrate (SENNA) 528mg  per 41mL oral liquid, 10 mL, Gastric (NG, OG, PEG, GT), 2x/day        No Known Allergies    Physical Exam:  General: Appears stated age, chronically and acutely ill, in no acute distress; trach and PEG in place  Neurologic: (limited due to unresponsiveness) awake, unable to follow commands or answer questions, pupils sluggishly reactive to light, jaw clenched with bite block in place; bedridden; withdraws all extremities to painful stimuli  Mental status: unable to assess due to unresponsiveness     Labs: no new labs to reveiw    Radiology:    CT LAB BRAIN WO IV CONTRAST   Final Result      MRI BRAIN W/WO CONTRAST   Final Result   1.   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   2.  Unchanged dilation of the left lateral ventricle especially the   temporal horn. There is unchanged herniation of the left hemisphere   especially the temporal lobe  through the craniectomy defect. These findings   are however worse than the prior CT of August 04, 2018.         XR AP MOBILE CHEST   Final Result   Suspect retrocardiac atelectasis/consolidation.               Microbiology:    [facility] (6/17) grew Staph epi  MRSA screen Positive  [facility] (6/18) grew E fecalis  Legionella and strep antigen negative    Consults: Neurology, Neurosurgery, Supportive care    Hardware (lines, foley's, tubes):   Patient Lines/Drains/Airways Status    Active Line / Dialysis Catheter / Dialysis Graft / Drain / Airway / Wound     Name: Placement date: Placement time: Site: Days:    Peripheral IV Ultrasound guided;Extended dwell catheter Left;Lower Arm  01/09/19   0300   4    Peripheral IV Ultrasound guided;Extended dwell catheter Right;Lower Arm  01/09/19   0300   4    Gastrostomy Tube Center  07/15/18   --   182     Foley Catheter  01/09/19   0040   4    Tracheostomy 6 Cuffed  07/15/18   1241   181    Wound (Non-Surgical) Perineum;Groin;Thigh  07/19/18   0800   177    Wound (Non-Surgical) Right Buttock;Thigh  01/09/19   0400   4                Assessment/ Plan:   Active Hospital Problems    Diagnosis   . Primary Problem: Seizure (CMS [place])   . 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 PEG (percutaneous endoscopic gastrostomy) status (CMS [facility])   . Chronic respiratory failure (CMS HCC)   . Vegetative state (CMS [place])   . CAP (community acquired pneumonia)   . Hypokalemia   . 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 Neurosurgery found no indication for VP shunt and signed off. Neurology helped to titrate seizure medications. She has been on antibiotics to treat blood stream infection. Supportive Care consulted to help with goals of care. Patient has poor long term prognosis with little improvement in her quality of life. We discussed with patient's daughter [name] (in person) and three other sisters (two on the phone) her current conditions, prognosis, and plans to switch to Comfort Measures. We discussed stopping all medications unrelated to enhancing her comfort, as well as what medications would be started. We discussed discontinuation of oxygen, and treatment of breathing with medications. We discussed transfer from stepdown to floor status, and transition to 7W in case she would be candidate for inpatient hospice. We will care for her direclty until she would become a  candidate for inpatient hospice. If not a candidate, would like to be discharged to another SNF or hospice house than where she came from. Everyone is in agreement at this time to transition [facility] to comfort measures only. Patient transitioned to [place] on 01/12/2019.    - discontinued all medications not related to comfort, discontinued oxygen therapy, discontinued IV antibiotics, will not start tube feeds  -  will continue anti-seizure medications due to risk of seizures, GERD medications  - continue IV PRN morphine 1 mg q4h PRN for pain and shortness of breath, IV ativan 1 mg q4h PRN for anxiety, agitation, tremor and muscle spasms, HR> 110, RR> 20, glycopyrrolate 200 mcg q4h PRN for secretions, liquid tylenol PRN for pain or fevers, bowel regimen continued  - will keep foley catheter in place, PEG for medication administration   - CMO order placed - transfer order to 52 W  - supportive care following; patient does not currently qualify for GIP; discussed with care management about the plan for discharge with hospice   - will evaluate patient for increased need of medications    Disposition Planning: inpatient hospice vs. SNF with hospice    [place]  PGY 1, Neurology  01/13/2019, 07:51          I saw and examined the patient.  I reviewed the resident's note.  I agree with the findings and plan of care as documented in the resident's note.  Any exceptions/additions are edited/noted.    Sindy Guadeloupe, MD 01/13/2019, 17:06  Department of Internal Medicine  [facility]

## 2019-01-13 NOTE — Nurses Notes (Signed)
Patient remains CMO and resting comfortably. No visible signs/symptoms of pain or respiratory distress today. No prn Ativan or morphine warranted at this time. Patient suctioned orally and bit block remains in place per family request. Signs of prior damage to front lower tooth and tongue from prior biting. Placement adjusted every two hours to prevent breakdown or injury. RT also monitoring. Skin precautions maintined and patient checked for incontinence/turned every two hours. Foley and PIV remain intact and patent. Family at bedside. Referrals sent for possible SNF's or hospice homes. Open bed on 7W. Report called to nurse Merleen Nicely.

## 2019-01-13 NOTE — Nurses Notes (Signed)
Patient transferred to 730.

## 2019-01-13 NOTE — Nurses Notes (Signed)
Patient resting comfortably. RR WDL, no vital or visible signs of distress. Family remains at bedside. Bite block moved to R side.

## 2019-01-13 NOTE — Transitional Care (Signed)
[facility] Medicine   Transitional Care Coordination   Initial Assessment       Name: [name]   Date of Birth: September 26, 1960 58 y.o.  Date of service: 01/13/2019    Spoke with staff at [facility] [phone] who stated the patient is a resident in their facility. Dr. Sandi Mealy is the Physician in their facility.     Loutricia Sandoval  01/13/2019, 10:29

## 2019-01-13 NOTE — Care Management Notes (Signed)
[facility]  Care Management Note    Patient Name: [name]  Date of Birth: May 11, 1961  Sex: female  Date/Time of Admission: 01/08/2019 10:52 PM  Room/Bed: 23/A  Payor: [place] / Plan: [place] MEDICAID / Product Type: Medicaid /    LOS: 4 days   Primary Care Providers:  Gray Bernhardt, MD, MD (General)    Admitting Diagnosis:  Seizure (CMS [facility]) [R56.9]    Assessment:      01/13/19 1408   Assessment Details   Assessment Type Continued Assessment   Date of Care Management Update 01/13/19   Date of Next DCP Update January 23, 2019   Care Management Plan   Discharge Planning Status plan in progress   Discharge Needs Assessment   Discharge Facility/Level of Care Needs Hospice Inpatient       Discharge Plan:  Hospice-Inpatient Respite (code 90)    Per service, patient continues to be followed and treated medically.  Supportive care following.  Patient is Hospice appropriate and family does not feel they can take her home and provide needed care for her.  Provided list of SNF's with CMS star rating for family to review and explained process of Hospice at SNF in terms of payment.      The patient will continue to be evaluated for developing discharge needs.     Case Manager: Suzi Roots, CASE MANAGER  Phone: [phone]

## 2019-01-14 LAB — ADULT ROUTINE BLOOD CULTURE, SET OF 2 BOTTLES (BACTERIA AND YEAST)
BLOOD CULTURE, ROUTINE: ABNORMAL — CR
BLOOD CULTURE, ROUTINE: NO GROWTH

## 2019-01-14 MED ADMIN — phenytoin 50 mg chewable tablet: GASTROSTOMY | @ 21:00:00

## 2019-01-14 MED ADMIN — cloBAZam 10 mg tablet: GASTROSTOMY | @ 14:00:00

## 2019-01-14 MED ADMIN — famotidine 20 mg tablet: GASTROSTOMY | @ 09:00:00

## 2019-01-14 MED ADMIN — senna leaf extract 176 mg/5 mL oral syrup: GASTROSTOMY | @ 09:00:00

## 2019-01-14 MED ADMIN — lacosamide 100 mg tablet: GASTROSTOMY | @ 21:00:00

## 2019-01-14 MED ADMIN — polyethylene glycol 3350 17 gram oral powder packet: GASTROSTOMY | @ 09:00:00

## 2019-01-14 MED ADMIN — lacosamide 100 mg tablet: GASTROSTOMY | @ 09:00:00

## 2019-01-14 MED ADMIN — morphine 2 mg/mL intravenous syringe: INTRAVENOUS | @ 10:00:00

## 2019-01-14 MED ADMIN — levETIRAcetam 500 mg tablet: GASTROSTOMY | @ 21:00:00

## 2019-01-14 MED ADMIN — phenytoin 50 mg chewable tablet: GASTROSTOMY | @ 14:00:00

## 2019-01-14 MED ADMIN — levETIRAcetam 500 mg tablet: GASTROSTOMY | @ 09:00:00

## 2019-01-14 NOTE — Care Plan (Signed)
Medical Nutrition Therapy    I: Pt with TF discontinued and no diet order. Per MD notes, pt is now Comfort Measures Only. PEG remains in place for meds, no plans to restart tube feeds.       P: Will sign off at this time.  Please consult if further nutrition intervention is warranted.     Ardith Dark, RDLD  01/14/2019, 07:51  Pager: [phone]

## 2019-01-14 NOTE — Care Management Notes (Signed)
[facility]  Care Management Note    Patient Name: [name]  Date of Birth: Apr 14, 1961  Sex: female  Date/Time of Admission: 01/08/2019 10:52 PM  Room/Bed: 730/B  Payor: [place] / Plan: [place] MEDICAID / Product Type: Medicaid /    LOS: 5 days   Primary Care Providers:  Gray Bernhardt, MD, MD (General)    Admitting Diagnosis:  Seizure (CMS [facility]) [R56.9]    Handoff given to MSW Lovena Le    The patient will continue to be evaluated for developing discharge needs.     Case Manager: Suzi Roots, CASE MANAGER  Phone: [phone]

## 2019-01-14 NOTE — Consults (Signed)
[place] Medicine Consult  Follow Up Note    [name], [name], 58 y.o. female  Date of Service: 01/14/2019  Date of Birth:  April 17, 1961    Hospital Day:  LOS: 5 days     Assessment/Recommendations:  Recommend: continue comfort measures care; appears comfortable at time of interview with daughter present at bedside; would continue to explore options for nursing facility with hospice or hospice house - discussed with patient and patients daughter at bedside as patient does not meet criteria for GIP at this time.     Supportive care services will sign off at this time - please contact supportive care if any changes in status concerning for possible GIP candidacy. Thank you.     Subjective: Patient laying supine in bed with HOB slightly elevated. Accompanied by MPOA, [name], at bedside during interview. Appears comfortable with non labored breathing. Examination and interview unchanged from prior. Patients daughter expresses concerns about restrictions on visitations and isolation if placement in a nursing facility due to COVID-19. Patients daughter states that she continues to work with social services and Dr Lacinda Axon in obtaining appropriate disposition for the patient.     Objective:  Temperature: 36.3 C (97.3 F)  Heart Rate: (!) 107  BP (Non-Invasive): (!) 141/74  Respiratory Rate: 18  SpO2: 91 %  Pain Score (Numeric, Faces): Other(rr 24)   General: laying supine in bed with HOB slightly elevated, no acute distress noted   Eyes: Conjunctiva clear, Pupils equal and round, Sclera non-icteric   HENT: ENT without erythema or injection, mucous membranes moist  Neck: No JVD or thyromegaly or lymphadenopathy  Lungs: clear to auscultation bilaterally, no crackles, no stridor  Cardiovascular: tachycardic rate, regular rhythm, no click, no rub   Abdomen: soft, bowel sounds normal and non-distended  Extremities: no cyanosis or edema, grossly intact   Skin: Skin warm and dry, No  rashes  Neurologic: not alert, not following commands, does not withdraw from painful stimuli (unchanged from prior)    Labs:  I have reviewed labs from this admission. Management per primary service discretion.     Imaging Studies:  Images and Reports reviewed from this admission. Management per primary service discretion.     Nita Sells, DO  Internal Medicine PGY-3  Pager [phone]     Total time: 35 minutes  Face to face time: 30 minutes    I saw and examined the patient.  I reviewed the resident's note.  I agree with the findings and plan of care as documented in the resident's note.  Any exceptions/additions are edited/noted.    Sigurd Sos, MD

## 2019-01-14 NOTE — Care Plan (Signed)
Plan of care reviewed. Pt unresponsive and cooperative with care this shift. Pt appears comfortable. Assessment per doc flow. Family at bedside. Pt free from falls this shift. Possible inpatient hospice vs SNF with hospice. Needs met with hourly rounding. Pt resting at this time with call bell in reach. Wheels locked and bed in lowest position. Will continue to monitor.     Problem: Communication Impairment (Mechanical Ventilation, Invasive)  Goal: Effective Communication  Outcome: Ongoing (see interventions/notes)     Problem: Device-Related Complication Risk (Mechanical Ventilation, Invasive)  Goal: Optimal Device Function  Outcome: Ongoing (see interventions/notes)     Problem: Inability to Wean (Mechanical Ventilation, Invasive)  Goal: Mechanical Ventilation Liberation  Outcome: Ongoing (see interventions/notes)     Problem: Nutrition Impairment (Mechanical Ventilation, Invasive)  Goal: Optimal Nutrition Delivery  Outcome: Ongoing (see interventions/notes)     Problem: Skin and Tissue Injury (Mechanical Ventilation, Invasive)  Goal: Absence of Device-Related Skin and Tissue Injury  Outcome: Ongoing (see interventions/notes)     Problem: Ventilator-Induced Lung Injury (Mechanical Ventilation, Invasive)  Goal: Absence of Ventilator-Induced Lung Injury  Outcome: Ongoing (see interventions/notes)     Problem: Skin Injury Risk Increased  Goal: Skin Health and Integrity  Outcome: Ongoing (see interventions/notes)     Problem: Seizure, Active Management  Goal: Absence of Seizure/Seizure-Related Injury  Outcome: Ongoing (see interventions/notes)     Leslye Peer, RN  01/14/2019, 01:34

## 2019-01-14 NOTE — Progress Notes (Signed)
[facility]  Medicine Progress Note  No CPR    [name]  Date of service: 01/14/2019    Subjective:   No acute events were reported overnight. Patient did not require any PRN medications overnight. This morning when I went to examine the patient, her daughter [name], who is also patient's MPOA, was at the bedside. The daughter expressed her frustration this morning regarding the Foley catheter. She states that the Foley does not drain appropriately and the daughter herself had to move the catheter bag and adjust it so that urine can drain. She stated that after she adjusted the bag, approximately 749ml urine accumulated in the bag. I asked the nurse if she noticed any issues with the Foley, however the nurse reported that there were no issues with the Foley and she tried to comfort patient's daughter earlier by reassuring her that the Foley catheter is adequately in place.  We also discussed with [name] about the fact that patient does not currently qualify for inpatient hospice, given that she did not require significant medication adjustments and appears resting comfortably in bed this AM. The daughter preferred earlier that if patient is discharged, she does not go back to the SNF where she resided prior to this admission. Patient discussed her options with CM, but stated that has looked herself into the available options of facilities with hospice service, but those facilities required 2 week isolation despite a negative Covid swab, which the family is not agreeable to. They want to be able to see the patient whenever she is discharged to a facility with hospice, and would prefer a facility that is closer to their home. We discussed with care management about this and they will continue to work towards finding a place for the patient.       Vital Signs:  Temp (24hrs) Max:36.1 C (97 F)      Systolic (40ZJQ), DUK:383 , Min:153 , KFM:403     Diastolic (75OHK), GOV:70, Min:87, Max:87    Temp   Avg: 36.1 C (97 F)  Min: 36.1 C (97 F)  Max: 36.1 C (97 F)  MAP (Non-Invasive)  Avg: 104 mmHG  Min: 104 mmHG  Max: 104 mmHG  Pulse  Avg: 93  Min: 93  Max: 93  Resp  Avg: 18  Min: 18  Max: 18  SpO2  Avg: 95 %  Min: 95 %  Max: 95 %     Fi02    I/O:  I/O last 24 hours:      Intake/Output Summary (Last 24 hours) at 01/14/2019 0733  Last data filed at 01/14/2019 0700  Gross per 24 hour   Intake 300 ml   Output 925 ml   Net -625 ml     I/O current shift:  06/23 0700 - 06/23 1859  In: -   Out: 150 [Urine:150]    acetaminophen (TYLENOL) 160 mg per 5 mL liquid, 650 mg, Gastric (NG, OG, PEG, GT), Q4H PRN  cloBAZam (ONFI) tablet, 20 mg, Gastric (NG, OG, PEG, GT), Q8H  diazePAM (VALIUM) 1mg  per mL oral solution, 5 mg, Gastric (NG, OG, PEG, GT), 2x/day  famotidine (PEPCID) tablet, 20 mg, Gastric (NG, OG, PEG, GT), 2x/day  glycopyrrolate (ROBINUL) 0.2 mg/mL (200 mcg/mL) injection, 200 mcg, Intravenous, Q4H PRN  lacosamide (VIMPAT) tablet, 200 mg, Gastric (NG, OG, PEG, GT), 2x/day  levETIRAcetam (KEPPRA) tablet, 1,500 mg, Gastric (NG, OG, PEG, GT), 2x/day  LORazepam (ATIVAN) 2 mg/mL injection, 1 mg, Intravenous, Q4H PRN  morphine  2 mg/mL injection, 1 mg, Intravenous, Q4H PRN  NS flush syringe, 2 mL, Intracatheter, Q8HRS    And  NS flush syringe, 2-6 mL, Intracatheter, Q1 MIN PRN  phenytoin (DILANTIN) chewable tablet, 100 mg, Gastric (NG, OG, PEG, GT), Q8HRS  polyethylene glycol (MIRALAX) oral packet, 17 g, Gastric (NG, OG, PEG, GT), Daily  senna concentrate (SENNA) 528mg  per 88mL oral liquid, 10 mL, Gastric (NG, OG, PEG, GT), 2x/day        No Known Allergies    Physical Exam:  General: Appears stated age, chronically and acutely ill,comfortable; trach and PEG in place  Neurologic: (limited due to unresponsiveness) does not track, follow commands or answer questions, pupils sluggishly reactive to light, jaw clenched with bite block in place; bedridden; withdraws all extremities to painful stimuli  Mental status: unable to  assess due to unresponsiveness     Labs: no new labs to reveiw    Radiology:    CT LAB BRAIN WO IV CONTRAST   Final Result      MRI BRAIN W/WO CONTRAST   Final Result   1.   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   2.  Unchanged dilation of the left lateral ventricle especially the   temporal horn. There is unchanged herniation of the left hemisphere   especially the temporal lobe through the craniectomy defect. These findings   are however worse than the prior CT of August 04, 2018.         XR AP MOBILE CHEST   Final Result   Suspect retrocardiac atelectasis/consolidation.             Consults: Neurology, Neurosurgery, Supportive care    Hardware (lines, foley's, tubes):   Patient Lines/Drains/Airways Status    Active Line / Dialysis Catheter / Dialysis Graft / Drain / Airway / Wound     Name: Placement date: Placement time: Site: Days:    Peripheral IV Ultrasound guided;Extended dwell catheter Left;Lower Arm  01/09/19   0300   5    Gastrostomy Tube Center  07/15/18   --   183    Foley Catheter  01/09/19   0040   5    Tracheostomy 6 Cuffed  07/15/18   1241   182    Wound (Non-Surgical) Perineum;Groin;Thigh  07/19/18   0800   178    Wound (Non-Surgical) Right Buttock;Thigh  01/09/19   0400   5                Assessment/ Plan:   Active Hospital Problems    Diagnosis   . Primary Problem: Seizure (CMS [place])   . 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 PEG (percutaneous endoscopic gastrostomy) status (CMS HCC)   . Chronic respiratory failure (CMS HCC)   . Vegetative state (CMS [place])   . CAP (community acquired pneumonia)   . Hypokalemia   . 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 female with history of tracheostomy and PEG dependence, and L tentorial mengionma s/p resection complicated by hemorrahge and evacuation as well as left hemisphere herniation in  semi-vegetative state initially admitted  On 6/17 with increased seizure activity, jaw clenching found to have Staph Bacteremia secondary to indwelling line. Neurosurgery found no indication for VP shunt and signed off. Neurology helped to titrate seizure medications. She has been on antibiotics to treat blood stream infection.  Supportive Care consulted to help with goals of care. Patient has poor long term prognosis with little improvement in her quality of life. We discussed with patient's daughter [name] (in person) and three other sisters (two on the phone) her current conditions, prognosis, and plans to switch to Comfort Measures. We discussed stopping all medications unrelated to enhancing her comfort, as well as what medications would be started. We discussed discontinuation of oxygen, and treatment of breathing with medications. We discussed transfer from stepdown to floor status, and transition to 7W in case she would be candidate for inpatient hospice. We will care for her direclty until she would become a candidate for inpatient hospice. If not a candidate, would like to be discharged to another SNF or hospice house than where she came from. Everyone is in agreement at this time to transition [facility] to comfort measures only. Patient transitioned to [place] on 01/12/2019.    - discussed with the nurse regarding the Foley catheter, will make sure it's draining properly  - discussed with care management about the available options for discharge with hospice as patient does not qualify for inpatient rehab at this time;  - COVID swab ordered for potential discharge  - patient's trach needs to be changed from cuffed to non-cuffed per CM before she is discharged   -- reached out to ENT for the trach  - will continue anti-seizure medications due to risk of seizures, GERD medications  - continue IV PRN morphine 1 mg q4h PRN for pain and shortness of breath, IV ativan 1 mg q4h PRN for anxiety, agitation,  tremor and muscle spasms, HR> 110, RR> 20, glycopyrrolate 200 mcg q4h PRN for secretions, liquid tylenol PRN for pain or fevers, bowel regimen continued  - will keep foley catheter in place and make sure it's draining well as above; PEG for medication administration   - will evaluate patient for increased need of medications    Disposition Planning: likely SNF with hospice    [place]  PGY 1, Neurology  01/14/2019, 07:33          I saw and examined the patient.  I reviewed the resident's note.  I agree with the findings and plan of care as documented in the resident's note.  Any exceptions/additions are edited/noted.    Sindy Guadeloupe, MD 01/14/2019, 18:21  Department of Internal Medicine  [facility] [place]

## 2019-01-14 NOTE — Care Plan (Signed)
Plan of care reviewed. Pt cmo. Pt sometimes opens eyes, no verbal response. Family at bedside throughout the day. Mostly appears comfortable, 1 prn morphine given for labored breathing. Turned q2. Family told to call out with any concerns. Will continue to monitor.  Problem: Communication Impairment (Mechanical Ventilation, Invasive)  Goal: Effective Communication  Outcome: Ongoing (see interventions/notes)     Problem: Seizure, Active Management  Goal: Absence of Seizure/Seizure-Related Injury  Outcome: Ongoing (see interventions/notes)     Problem: End-of-Life Care  Goal: Comfort, Peace and Preserved Dignity  Outcome: Ongoing (see interventions/notes)

## 2019-01-14 NOTE — Care Management Notes (Signed)
[facility]  Care Management Note    Patient Name: [name]  Date of Birth: [birth year]  Sex: female  Date/Time of Admission: 01/08/2019 10:52 PM  Room/Bed: 730/B  Payor: [place] / Plan: [place] MEDICAID / Product Type: Medicaid /    LOS: 5 days   Primary Care Providers:  Gray Bernhardt, MD, MD (General)    Admitting Diagnosis:  Seizure (CMS [facility]) [R56.9]    Assessment:      01/14/19 1542   Assessment Details   Assessment Type Continued Assessment   Date of Care Management Update 01/14/19   Date of Next DCP Update 01/17/19   Care Management Plan   Discharge Planning Status plan in progress   Projected Discharge Date 01/09/2019   Discharge plan discussed with: MPOA   CM will evaluate for rehabilitation potential yes   Patient choice offered to patient/family yes   Form for patient choice reviewed/signed and on chart yes   Facility or [place] [place]   Patient aware of possible cost for ambulance transport?  Yes   Discharge Needs Assessment   Discharge Facility/Level of Care Needs SNF with Hospice (code 4)   Transportation Available ambulance     MSW met with pt, Briana at bedside. She is agreeable for pt to go back to [facility] as they are allowing visitors. Discussed options for hospice services, [name] chose [facility]. MSW sent clinical update to [facility] and referral to [facility]. MSW spoke with Danae Chen with [facility], they can take pt back tomorrow. Pt will need COVID swab and her trach needs exchanged out for an uncuff. They can not accept a cuff trach. Service updated.     Discharge Plan:  SNF with Hospice (code 74)      The patient will continue to be evaluated for developing discharge needs.     Case Manager: Norton Pastel, [place]  Phone: [phone]

## 2019-01-15 ENCOUNTER — Non-Acute Institutional Stay
Admission: RE | Admit: 2019-01-15 | Discharge: 2019-01-22 | Disposition: E | Payer: MEDICAID | Source: Ambulatory Visit | Attending: GERIATRIC MEDICINE | Admitting: GERIATRIC MEDICINE

## 2019-01-15 ENCOUNTER — Non-Acute Institutional Stay: Payer: Self-pay

## 2019-01-15 ENCOUNTER — Other Ambulatory Visit: Payer: Self-pay

## 2019-01-15 ENCOUNTER — Non-Acute Institutional Stay: Payer: MEDICAID | Attending: GERIATRIC MEDICINE

## 2019-01-15 ENCOUNTER — Other Ambulatory Visit: Payer: MEDICAID

## 2019-01-15 DIAGNOSIS — Z8614 Personal history of Methicillin resistant Staphylococcus aureus infection: Secondary | ICD-10-CM | POA: Insufficient documentation

## 2019-01-15 DIAGNOSIS — Z515 Encounter for palliative care: Principal | ICD-10-CM | POA: Insufficient documentation

## 2019-01-15 DIAGNOSIS — G40919 Epilepsy, unspecified, intractable, without status epilepticus: Secondary | ICD-10-CM | POA: Insufficient documentation

## 2019-01-15 DIAGNOSIS — Z66 Do not resuscitate: Secondary | ICD-10-CM | POA: Insufficient documentation

## 2019-01-15 DIAGNOSIS — Z930 Tracheostomy status: Secondary | ICD-10-CM

## 2019-01-15 DIAGNOSIS — Z931 Gastrostomy status: Secondary | ICD-10-CM

## 2019-01-15 DIAGNOSIS — I10 Essential (primary) hypertension: Secondary | ICD-10-CM | POA: Insufficient documentation

## 2019-01-15 DIAGNOSIS — R627 Adult failure to thrive: Secondary | ICD-10-CM | POA: Insufficient documentation

## 2019-01-15 DIAGNOSIS — A419 Sepsis, unspecified organism: Secondary | ICD-10-CM | POA: Diagnosis present

## 2019-01-15 DIAGNOSIS — T17890A Other foreign object in other parts of respiratory tract causing asphyxiation, initial encounter: Secondary | ICD-10-CM

## 2019-01-15 LAB — COVID-19 ~~LOC~~ MOLECULAR LAB TESTING: 2019-nCoV/SARS-CoV-2: NOT DETECTED

## 2019-01-15 LAB — ADULT ROUTINE BLOOD CULTURE, SET OF 2 BOTTLES (BACTERIA AND YEAST): BLOOD CULTURE, ROUTINE: NO GROWTH

## 2019-01-15 MED ORDER — LORAZEPAM 2 MG/ML INJECTION SOLUTION
1.00 mg | Freq: Four times a day (QID) | INTRAMUSCULAR | Status: AC
Start: 2019-01-15 — End: ?

## 2019-01-15 MED ORDER — PCA - MORPHINE 1 MG/ML IN NS
INJECTION | INTRAVENOUS | Status: DC
Start: 2019-01-15 — End: 2019-01-16
  Filled 2019-01-15: qty 50

## 2019-01-15 MED ORDER — LACOSAMIDE 200 MG/20 ML INTRAVENOUS SOLUTION
200.00 mg | Freq: Two times a day (BID) | INTRAVENOUS | Status: DC
Start: 2019-01-15 — End: 2019-01-15

## 2019-01-15 MED ORDER — LACOSAMIDE 100 MG TABLET
200.00 mg | ORAL_TABLET | Freq: Two times a day (BID) | ORAL | Status: DC
Start: 2019-01-15 — End: 2019-01-16
  Administered 2019-01-15: 21:00:00 200 mg via GASTROSTOMY
  Filled 2019-01-15: qty 2

## 2019-01-15 MED ORDER — SCOPOLAMINE 1 MG OVER 3 DAYS TRANSDERMAL PATCH
1.00 | MEDICATED_PATCH | TRANSDERMAL | Status: DC
Start: 2019-01-15 — End: 2019-01-16
  Administered 2019-01-15: 1 via TRANSDERMAL
  Administered 2019-01-16: 03:00:00
  Filled 2019-01-15: qty 1

## 2019-01-15 MED ORDER — LEVETIRACETAM 500 MG/5 ML INTRAVENOUS SOLUTION
1500.0000 mg | Freq: Two times a day (BID) | INTRAVENOUS | Status: DC
Start: 2019-01-15 — End: 2019-01-16
  Administered 2019-01-15: 21:00:00 0 mg via INTRAVENOUS
  Administered 2019-01-15: 21:00:00 1500 mg via INTRAVENOUS
  Filled 2019-01-15 (×2): qty 15

## 2019-01-15 MED ORDER — PCA - MORPHINE 1 MG/ML IN NS
INJECTION | INTRAVENOUS | Status: AC
Start: 2019-01-15 — End: ?

## 2019-01-15 MED ORDER — MORPHINE 2 MG/ML INTRAVENOUS SYRINGE
1.00 mg | INJECTION | INTRAVENOUS | Status: DC | PRN
Start: 2019-01-15 — End: 2019-01-15
  Administered 2019-01-15 (×3): 1 mg via INTRAVENOUS
  Filled 2019-01-15 (×3): qty 1

## 2019-01-15 MED ORDER — MORPHINE 4 MG/ML INTRAVENOUS SOLUTION
6.0000 mg | INTRAVENOUS | Status: DC | PRN
Start: 2019-01-15 — End: 2019-01-16
  Administered 2019-01-15 (×4): 6 mg via INTRAVENOUS
  Filled 2019-01-15 (×4): qty 2

## 2019-01-15 MED ORDER — PCA - MORPHINE 1 MG/ML IN NS
INJECTION | INTRAVENOUS | Status: DC
Start: 2019-01-15 — End: 2019-01-15
  Administered 2019-01-15: 12:00:00 via INTRAVENOUS
  Filled 2019-01-15: qty 50

## 2019-01-15 MED ORDER — FAMOTIDINE 20 MG TABLET
20.00 mg | ORAL_TABLET | Freq: Two times a day (BID) | ORAL | Status: AC
Start: 2019-01-15 — End: ?

## 2019-01-15 MED ORDER — MORPHINE 4 MG/ML INTRAVENOUS SOLUTION
6.00 mg | INTRAVENOUS | Status: DC | PRN
Start: 2019-01-15 — End: 2019-01-15
  Administered 2019-01-15: 6 mg via INTRAVENOUS
  Filled 2019-01-15: qty 2

## 2019-01-15 MED ORDER — DEXAMETHASONE SODIUM PHOSPHATE 4 MG/ML INJECTION SOLUTION
4.0000 mg | Freq: Every day | INTRAMUSCULAR | Status: DC
Start: 2019-01-15 — End: 2019-01-15
  Administered 2019-01-15: 4 mg via INTRAVENOUS
  Filled 2019-01-15: qty 1

## 2019-01-15 MED ORDER — POLYETHYLENE GLYCOL 3350 17 GRAM ORAL POWDER PACKET
17.0000 g | Freq: Every day | ORAL | Status: AC
Start: 2019-01-16 — End: ?

## 2019-01-15 MED ORDER — CLOBAZAM 10 MG TABLET
20.00 mg | ORAL_TABLET | Freq: Three times a day (TID) | ORAL | Status: DC
Start: 2019-01-15 — End: 2019-01-16
  Administered 2019-01-15: 21:00:00 20 mg via GASTROSTOMY
  Filled 2019-01-15: qty 2

## 2019-01-15 MED ORDER — ACETAMINOPHEN 325 MG/10.15 ML ORAL SUSPENSION
650.00 mg | ORAL | Status: AC | PRN
Start: 2019-01-15 — End: ?

## 2019-01-15 MED ORDER — PHENYTOIN SODIUM 50 MG/ML INTRAVENOUS SOLUTION
100.0000 mg | Freq: Three times a day (TID) | INTRAVENOUS | Status: DC
Start: 2019-01-15 — End: 2019-01-15
  Filled 2019-01-15 (×2): qty 2

## 2019-01-15 MED ORDER — PHENYTOIN 50 MG CHEWABLE TABLET
100.0000 mg | CHEWABLE_TABLET | Freq: Three times a day (TID) | ORAL | Status: DC
Start: 2019-01-15 — End: 2019-01-16
  Administered 2019-01-15: 100 mg via GASTROSTOMY
  Filled 2019-01-15: qty 2

## 2019-01-15 MED ORDER — LORAZEPAM 2 MG/ML INJECTION SOLUTION
1.0000 mg | Freq: Four times a day (QID) | INTRAMUSCULAR | Status: DC
Start: 2019-01-15 — End: 2019-01-16
  Administered 2019-01-15: 17:00:00 1 mg via INTRAVENOUS
  Administered 2019-01-16
  Filled 2019-01-15: qty 1

## 2019-01-15 MED ORDER — GLYCOPYRROLATE 0.2 MG/ML INJECTION SOLUTION
200.00 ug | Freq: Four times a day (QID) | INTRAMUSCULAR | Status: DC
Start: 2019-01-15 — End: 2019-01-16
  Administered 2019-01-15: 17:00:00 200 ug via INTRAVENOUS
  Filled 2019-01-15 (×2): qty 1

## 2019-01-15 MED ORDER — PCA - MORPHINE 1 MG/ML IN NS
INJECTION | INTRAVENOUS | Status: AC
Start: 2019-01-15 — End: 2019-01-15
  Filled 2019-01-15: qty 50

## 2019-01-15 MED ORDER — LORAZEPAM 2 MG/ML INJECTION SOLUTION
1.00 mg | Freq: Four times a day (QID) | INTRAMUSCULAR | Status: DC
Start: 2019-01-15 — End: 2019-01-15
  Administered 2019-01-15: 1 mg via INTRAVENOUS
  Filled 2019-01-15: qty 1

## 2019-01-15 MED ORDER — LORAZEPAM 2 MG/ML INJECTION SOLUTION
1.00 mg | INTRAMUSCULAR | Status: DC | PRN
Start: 2019-01-15 — End: 2019-01-15
  Administered 2019-01-15: 1 mg via INTRAVENOUS
  Filled 2019-01-15: qty 1

## 2019-01-15 MED ORDER — MORPHINE 4 MG/ML INTRAVENOUS SOLUTION
6.00 mg | INTRAVENOUS | Status: AC | PRN
Start: 2019-01-15 — End: ?

## 2019-01-15 MED ORDER — DIAZEPAM 5 MG/5 ML (1 MG/ML, 5 ML) ORAL SOLUTION
5.00 mg | Freq: Two times a day (BID) | ORAL | Status: AC
Start: 2019-01-15 — End: ?

## 2019-01-15 MED ORDER — LORAZEPAM 2 MG/ML INJECTION SOLUTION
1.0000 mg | INTRAMUSCULAR | Status: DC | PRN
Start: 2019-01-15 — End: 2019-01-16

## 2019-01-15 MED ORDER — ACETAMINOPHEN 650 MG RECTAL SUPPOSITORY
650.0000 mg | Freq: Four times a day (QID) | RECTAL | Status: DC | PRN
Start: 2019-01-15 — End: 2019-01-16

## 2019-01-15 MED ADMIN — lacosamide 100 mg tablet: GASTROSTOMY | @ 09:00:00

## 2019-01-15 MED ADMIN — LORazepam 2 mg/mL injection solution: INTRAVENOUS | @ 02:00:00

## 2019-01-15 MED ADMIN — phenytoin 50 mg chewable tablet: GASTROSTOMY | @ 06:00:00

## 2019-01-15 MED ADMIN — LORazepam 2 mg/mL injection solution: INTRAVENOUS | @ 03:00:00

## 2019-01-15 MED ADMIN — dexAMETHasone sodium phosphate 4 mg/mL injection solution: INTRAVENOUS | @ 12:00:00

## 2019-01-15 MED ADMIN — glycopyrrolate 0.2 mg/mL injection solution: INTRAVENOUS | @ 17:00:00

## 2019-01-15 MED ADMIN — cloBAZam 10 mg tablet: GASTROSTOMY | @ 21:00:00

## 2019-01-15 MED ADMIN — glycopyrrolate 0.2 mg/mL injection solution: @ 06:00:00

## 2019-01-15 MED ADMIN — sodium chloride 0.9 % (flush) injection syringe: @ 13:00:00

## 2019-01-15 MED ADMIN — diazePAM 5 mg/5 mL (1 mg/mL, 5 mL) oral solution: GASTROSTOMY | @ 09:00:00

## 2019-01-15 MED ADMIN — morphine 4 mg/mL intravenous solution: INTRAVENOUS | @ 21:00:00

## 2019-01-15 MED ADMIN — sodium chloride 0.9 % (flush) injection syringe: @ 06:00:00

## 2019-01-15 MED ADMIN — morphine 2 mg/mL intravenous syringe: INTRAVENOUS | @ 01:00:00

## 2019-01-15 MED ADMIN — scopolamine 1 mg over 3 days transdermal patch: TRANSDERMAL | @ 18:00:00

## 2019-01-15 MED ADMIN — LORazepam 2 mg/mL injection solution: INTRAVENOUS | @ 12:00:00

## 2019-01-15 MED ADMIN — morphine 4 mg/mL intravenous solution: INTRAVENOUS | @ 13:00:00

## 2019-01-15 MED ADMIN — morphine 1 mg/mL in 0.9 % sodium chloride intravenous: INTRAVENOUS | @ 06:00:00

## 2019-01-15 MED ADMIN — morphine 2 mg/mL intravenous syringe: INTRAVENOUS | @ 03:00:00

## 2019-01-15 MED ADMIN — morphine 4 mg/mL intravenous solution: INTRAVENOUS | @ 17:00:00

## 2019-01-15 MED ADMIN — Medication: INTRAVENOUS | @ 21:00:00

## 2019-01-15 MED ADMIN — morphine 4 mg/mL intravenous solution: INTRAVENOUS | @ 15:00:00

## 2019-01-15 MED ADMIN — cloBAZam 10 mg tablet: GASTROSTOMY | @ 13:00:00

## 2019-01-15 MED ADMIN — morphine 4 mg/mL intravenous solution: INTRAVENOUS | @ 18:00:00

## 2019-01-15 MED ADMIN — glycopyrrolate 0.2 mg/mL injection solution: @ 08:00:00

## 2019-01-15 MED ADMIN — phenytoin 50 mg chewable tablet: GASTROSTOMY | @ 13:00:00

## 2019-01-15 MED ADMIN — famotidine 20 mg tablet: GASTROSTOMY | @ 09:00:00

## 2019-01-15 MED ADMIN — morphine 1 mg/mL in 0.9 % sodium chloride intravenous: INTRAVENOUS | @ 16:00:00

## 2019-01-15 MED ADMIN — levETIRAcetam 500 mg tablet: GASTROSTOMY | @ 09:00:00

## 2019-01-15 NOTE — Nurses Notes (Signed)
Pt cease to breath at 2310, no HR or resp noted, pupils fixed, Briana(daughter) and hospice nurse notified.

## 2019-01-15 NOTE — Progress Notes (Signed)
[facility]  Medicine Progress Note  No CPR    [name]  Date of service: 01/12/2019    Subjective:   Overnight, patient's daughter, who was at the bedside, noticed that patient's breathing has changed. Per the nursing report, patient appeared cyanotic with labored breathing and accessory muscle use. PRN Robinul, Morphine and Ativan was administer without significant relief to patient's respiratory distress. Night float was paged and patient was evaluated at bedside. No breathing sounds were heard on auscultation, so the ENT was STAT paged to assess the trach. Patient was found to have an acute thick mucous plug that was obstructing her trach. It was removed at bedside with suction and saline, and inner cannula changed. Patient respiratory status has improved after the removal of the mucous plug. PRN Robinul was held to avoid thickening of secretions. The trach was placed by trauma surgery in Dec, 2019. We consulted them to remove the trach cuff. Will reach out again to Supportive Care to assess the patient for GIP.    Vital Signs:  Temp (24hrs) Max:36.8 C (10.1 F)      Systolic (75ZWC), HEN:277 , Min:141 , OEU:235     Diastolic (36RWE), RXV:40, Min:74, Max:74    Temp  Avg: 36.6 C (97.8 F)  Min: 36.3 C (97.3 F)  Max: 36.8 C (98.2 F)  MAP (Non-Invasive)  Avg: 89 mmHG  Min: 89 mmHG  Max: 89 mmHG  Pulse  Avg: 105  Min: 103  Max: 107  Resp  Avg: 23  Min: 16  Max: 30  SpO2  Avg: 90.5 %  Min: 90 %  Max: 91 %  Pain Score (Numeric, Faces): Other(rr 24)  Fi02    I/O:  I/O last 24 hours:      Intake/Output Summary (Last 24 hours) at 01/09/2019 0703  Last data filed at 01/09/2019 0700  Gross per 24 hour   Intake 460 ml   Output 2150 ml   Net -1690 ml     I/O current shift:  06/24 0700 - 06/24 1859  In: -   Out: 400 [Urine:400]    acetaminophen (TYLENOL) 160 mg per 5 mL liquid, 650 mg, Gastric (NG, OG, PEG, GT), Q4H PRN  cloBAZam (ONFI) tablet, 20 mg, Gastric (NG, OG, PEG, GT), Q8H  diazePAM (VALIUM)  1mg  per mL oral solution, 5 mg, Gastric (NG, OG, PEG, GT), 2x/day  famotidine (PEPCID) tablet, 20 mg, Gastric (NG, OG, PEG, GT), 2x/day  [Held by provider] glycopyrrolate (ROBINUL) 0.2 mg/mL (200 mcg/mL) injection, 200 mcg, Intravenous, Q4H PRN  lacosamide (VIMPAT) tablet, 200 mg, Gastric (NG, OG, PEG, GT), 2x/day  levETIRAcetam (KEPPRA) tablet, 1,500 mg, Gastric (NG, OG, PEG, GT), 2x/day  LORazepam (ATIVAN) 2 mg/mL injection, 1 mg, Intravenous, Q15 Min PRN  morphine 1 mg/mL in NS PCA, , Intravenous, Continuous  morphine 2 mg/mL injection, 1 mg, Intravenous, Q15 Min PRN  NS flush syringe, 2 mL, Intracatheter, Q8HRS    And  NS flush syringe, 2-6 mL, Intracatheter, Q1 MIN PRN  phenytoin (DILANTIN) chewable tablet, 100 mg, Gastric (NG, OG, PEG, GT), Q8HRS  polyethylene glycol (MIRALAX) oral packet, 17 g, Gastric (NG, OG, PEG, GT), Daily  senna concentrate (SENNA) 528mg  per 84mL oral liquid, 10 mL, Gastric (NG, OG, PEG, GT), 2x/day        No Known Allergies    Physical Exam:  General: Appears stated age, chronically and acutely ill, resting comfortably in bed; trach and PEG in place  Neurologic: (limited due to unresponsiveness) does  not track, follow commands or answer questions, pupils sluggishly reactive to light, jaw clenched with bite block in place; bedridden; Mental status: unable to assess due to unresponsiveness     Labs: no new labs to reveiw    Radiology:    CT LAB BRAIN WO IV CONTRAST   Final Result      MRI BRAIN W/WO CONTRAST   Final Result   1.   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   2.  Unchanged dilation of the left lateral ventricle especially the   temporal horn. There is unchanged herniation of the left hemisphere   especially the temporal lobe through the craniectomy defect. These findings   are however worse than the prior CT of August 04, 2018.         XR AP MOBILE CHEST   Final Result   Suspect retrocardiac atelectasis/consolidation.             Consults:  Neurology, Neurosurgery, Supportive care    Hardware (lines, foley's, tubes):   Patient Lines/Drains/Airways Status    Active Line / Dialysis Catheter / Dialysis Graft / Drain / Airway / Wound     Name: Placement date: Placement time: Site: Days:    Peripheral IV Ultrasound guided;Extended dwell catheter Left;Lower Arm  01/09/19   0300   6    Gastrostomy Tube Center  07/15/18   --   184    Foley Catheter  01/09/19   0040   6    Tracheostomy 6 Cuffed  07/15/18   1241   183    Wound (Non-Surgical) Perineum;Groin;Thigh  07/19/18   0800   179    Wound (Non-Surgical) Right Buttock;Thigh  01/09/19   0400   6                Assessment/ Plan:   Active Hospital Problems    Diagnosis   . Primary Problem: Seizure (CMS [place])   . 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 PEG (percutaneous endoscopic gastrostomy) status (CMS HCC)   . Chronic respiratory failure (CMS HCC)   . Vegetative state (CMS [place])   . CAP (community acquired pneumonia)   . Hypokalemia   . 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 Neurosurgery found no indication for VP shunt and signed off. Neurology helped to titrate seizure medications. She has been on antibiotics to treat blood stream infection. Supportive Care consulted to help with goals of care. Patient has poor long term prognosis with little improvement in her quality of life. We discussed with patient's daughter [name] (in person) and three other sisters (two on the phone) her current conditions, prognosis, and plans to switch to Comfort Measures. We  discussed stopping all medications unrelated to enhancing her comfort, as well as what medications would be started. We discussed discontinuation of oxygen, and treatment of breathing with medications. We discussed transfer from stepdown to floor status, and transition to 7W in case she would be candidate for inpatient hospice. We will care for her direclty until she would become a candidate for inpatient hospice. If not  a candidate, would like to be discharged to another SNF or hospice house than where she came from. Everyone is in agreement at this time to transition [facility] to comfort measures only. Patient transitioned to [place] on 01/12/2019. Patient experienced an episode of worsening respiratory distress overnight due to an acute thick mucous plug that was obstructing the trach. ENT was consulted and the mucous plug was suctioned and removed at bedside. Patient's breathing has improved. Reached out to supportive care again for GIP evaluation.    - given the episode of respiratory distress secondary to the acute thick mucous plug that was obstructing the trach, will hold PRN Robinul to avoid worsening thick secretions  - discussed with supportive care again for GIP evaluation  - COVID swab that was ordered yesterday for potential discharge if the patient did not qualify for GIP came back as negative  - per CM, patient's trach does not need to be changed from cuffed to non-cuffed if she qualifies to GIP  - will continue anti-seizure medications due to risk of seizures, GERD medications  - patient was started on a morphine PCA overnight, will continue  - IV PRN morphine 6 mg q80min PRN for pain and shortness of breath, IV ativan 1 mg q6h PRN for anxiety, agitation, tremor and muscle spasms, HR> 110, RR> 20, liquid tylenol PRN for pain or fevers, bowel regimen continued  - will keep foley catheter in place and make sure it's draining well; PEG for medication administration     Disposition Planning:  consulted supportive care again for GIP evaluation    [place]  PGY 1, Neurology  01/09/2019, 07:03          I saw and examined the patient.  I reviewed the resident's note.  I agree with the findings and plan of care as documented in the resident's note.  Any exceptions/additions are edited/noted.    Transferred to inpatient hospice.    Sindy Guadeloupe, MD 12/24/2018, 16:07  Department of Internal Medicine  [facility] [place]

## 2019-01-15 NOTE — Nurses Notes (Signed)
Resp shallow and irregular, morphine 6 mg IV administered per family request. Pt appears comfortable.

## 2019-01-15 NOTE — Care Management Notes (Signed)
Referral Information  ++++++ Placed Provider #1 ++++++  Case Manager: Lori Taylor  Provider Type: Nursing Home/SNF-Return  Address:  ,    Contact:    Fax:   Fax:

## 2019-01-15 NOTE — Progress Notes (Signed)
INTERVAL PROGRESS NOTE    Paged by RN that patient RR not improving despite PRN 3mg  PRN morphine, PCA was ordered, and was then paged that patient was in significant respiratory distress, and was acutely turning blue/grey.     Bedside exam revealed no respiratory sounds bilaterally, RT paged, patient was suctioned with improvement in RR and color. ENT paged for assistance with tracheostomy management to ensure placement and not plugged, family at bedside was included on all decisions/interventions completed and were agreeable. See ENT note for details. Patient significantly improved respiratory status post intervention. Glycopyrrolate held per ENT recommendations until further decisions could be made by primary team.     Johnney Killian, MD  01/19/2019 06:21

## 2019-01-15 NOTE — Care Management Notes (Signed)
[facility]  Care Management Note    Patient Name: [name]  Date of Birth: 1961-07-05  Sex: female  Date/Time of Admission: 01/08/2019 10:52 PM  Room/Bed: 730/B  Payor: [place] MEDICAID / Plan: [place] MEDICAID / Product Type: Medicaid /    LOS: 6 days   Primary Care Providers:  Gray Bernhardt, MD, MD (General)    Admitting Diagnosis:  Seizure (CMS [facility]) [R56.9]    Assessment:      01/06/2019 1147   Assessment Details   Assessment Type Continued Assessment   Date of Care Management Update 01/02/2019   Date of Next DCP Update 01/17/19   Care Management Plan   Discharge Planning Status discharge plan complete   Projected Discharge Date 12/29/2018   Discharge Needs Assessment   Discharge Facility/Level of Care Needs Hospice-Inpatient Respite (code 61)     Per supportive care pt will be converted to inpt hospice today due to her change in condition. MSW updated Danae Chen with [facility].    Discharge Plan:  Hospice-Inpatient Respite (code 36)      The patient will continue to be evaluated for developing discharge needs.     Case Manager: Norton Pastel, [place]  Phone: [phone]

## 2019-01-15 NOTE — Consults (Signed)
[facility] Consult  Follow Up Note    [name], [name], 58 y.o. female  Date of Service: 12/27/2018  Date of Birth:  02/22/1961    Hospital Day:  LOS: 6 days     Assessment/Recommendations: Encounter for palliative care.  Adult Failure to Thrive secondary to intractable seizures with acute hypoxic respiratory failure 2/2 mucus plugging     OME: 84 mg. CrCl 137.    Pt remains CMO. Overnight, she became hypoxic and cyanotic requiring tracheal suctioning secondary to mucus plugging. This morning, her breathing is labored despite initiation of Morphine PCA at 4 mg/hr, 4 PRN doses of 1 mg IV Morphine, and 2 doses of 1 mg IV Ativan.    Pt is appropriate for admission to GIP at this time. Pt's daughter/MPOA [name]) is agreeable.    Recommendations:  1. Increase Morphine PCA to 6 mg/hr.  2. Increase PRN Morphine to 6 mg Q30 minutes PRN pain, dyspnea.  3. Schedule Ativan 1 mg IV Q6H.  4. Start Dexamethasone 4 mg IV daily.  5. Continue Clobazam, Valium, Vimpat, Keppra, Dilantin.    Plan of care was communicated with primary team.     Thank you for allowing [place] to participate in Ms. Quintela's care. Supportive care will sign off.       Pain: PainAD  3    POST completed:  POST form not discussed    Subjective: Pt's two daughters at bedside. Pt with hypoxic/cyanotic episode overnight due to mucus plugging. Family wishes for patient to be admitted to GIP. Pt's breathing pattern irregular and labored.     Objective:  Temperature: 36.3 C (97.3 F)  Heart Rate: 90  BP (Non-Invasive): 102/69  Respiratory Rate: 17  SpO2: (!) 84 %  Pain Score (Numeric, Faces): Other(rr 24)  Physical Exam:  General: appears acutely and chronically ill, older than stated age, and dyspneic  ENT: Mouth mucous membranes dry, intact. Trach present   Lungs: Clear to auscultation bilaterally. No wheezes, rales, or rhonchi  Cardiovascular: regular rate and rhythm, 3/6 systolic murmur. 2+ facial edema. +2 L  radial and bilateral pedal pulses   Abdomen: Soft, non-tender, Bowel sounds normal, non-distended. PEG present  Musculoskeletal: Head defect on L side where flap present  Skin: Skin warm and dry, No rashes   Neurologic: comatose   Other symptoms present: Dyspnea and Delirium        Labs:    Lab Results Today:    Results for orders placed or performed during the hospital encounter of 01/08/19 (from the past 24 hour(s))   COVID-19 SCREENING - Likely to be Discharged   Result Value Ref Range    2019-nCoV/SARS-CoV-2 Not Detected Not Detected       Ventilator Settings: N/A  HFNC Settings: N/A  BIPAP Settings: N/A    Imaging Studies:    I have reviewed all pertinent radiology. No new.    Patient/Family Meeting held, present were: patient's daughter/MPOA [name]), pt's daughter ([name]),  Harriet Masson, FNP, Lilyan Punt, MSW and R.O. Hulan Fess, MD    Discussion with patient and family included: current problems, prognosis of hours to days, treatment limitations, treatment options, hospice, pt/family goals for care, pain history, prior treatment and other symptom management    Location of pt/family meeting:  bedside  Outcome of pt/family meeting: continue CMO. Admit to GIP     Consultant:  Laqueta Jean, APRN,NP-C  Total Time of Encounter:  50 minutes   Time in Counseling as above:  30 minutes   Start time:  1100  Stop Time:  [place], APRN,NP-C  01/11/2019, 15:14

## 2019-01-15 NOTE — Discharge Summary (Signed)
[facility]  DISCHARGE SUMMARY    PATIENT NAME:  [name], [name]  MRN:  [id number]  DOB:  [birth year]    ENCOUNTER DATE:  01/08/2019  INPATIENT ADMISSION DATE: 01/09/2019  DISCHARGE DATE:  01/17/2019    ATTENDING PHYSICIAN: No att. providers found  SERVICE: MEDICINE 5  PRIMARY CARE PHYSICIAN: Gray Bernhardt, MD     Has PCP been verified with patient and updated? Yes    LAY CAREGIVER:  ,  ,        PRIMARY DISCHARGE DIAGNOSIS: Seizure (CMS [facility])  Active Hospital Problems    Diagnosis Date Noted   . Principle Problem: Seizure (CMS [place]) [R56.9] 01/09/2019   . 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 PEG (percutaneous endoscopic gastrostomy) status (CMS [place]) [Z93.1] 01/10/2019   . Chronic respiratory failure (CMS HCC) [J96.10] 01/10/2019   . Vegetative state (CMS [place]) [R40.3] 01/09/2019   . CAP (community acquired pneumonia) [J18.9] 01/09/2019   . Hypokalemia [E87.6] 07/10/2018   . 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     Active Non-Hospital Problems    Diagnosis Date Noted   . Tobacco use disorder 07/11/2018   . Encounter for HCV screening test for high risk patient 06/08/2018        DISCHARGE MEDICATIONS:     Current Discharge Medication List      START taking these medications.      Details   acetaminophen 325 mg/10.15 mL Suspension  Commonly known as:  TYLENOL  Replaces:  acetaminophen 325 mg Tablet   650 mg, Gastric (NG, OG, PEG, GT), EVERY 4 HOURS PRN  Refills:  0     diazePAM 5 mg/5 mL Solution  Commonly known as:  VALIUM  Replaces:  Diastat AcuDiaL 5-7.5-10 mg Kit   5 mg, Gastric (NG, OG, PEG, GT), 2 TIMES DAILY  Refills:  0     famotidine 20 mg Tablet  Commonly known as:  PEPCID   20 mg, Gastric (NG, OG, PEG, GT), 2 TIMES DAILY  Refills:  0     LORazepam 2 mg/mL Solution  Commonly known as:  ATIVAN   1 mg,  Intravenous, EVERY 6 HOURS  Refills:  0     morphine 4 mg/mL Solution   6 mg, Intravenous, EVERY 30 MIN PRN  Refills:  0     morphine in NS 1 mg/mL Pt Controlled Analgesic Soln   Intravenous, CONTINUOUS,   PCA Continuous/Basal Rate: 6 mg/hr PCA Patient Bolus/Dose: 0 mg PCA Lockout Interval: 10 Minutes  Refills:  0     polyethylene glycol 17 gram Powder in Packet  Commonly known as:  MIRALAX  Start taking on:  02-04-2019   17 g, Gastric (NG, OG, PEG, GT), DAILY  Refills:  0        CONTINUE these medications which have CHANGED during your visit.      Details   phenytoin 100 mg/4 mL Suspension  Commonly known as:  DILANTIN  What changed:  how much to take   100 mg, Gastric (NG, OG, PEG, GT), EVERY 8 HOURS (SCHEDULED)  Refills:  0        CONTINUE these medications - NO CHANGES were made during your visit.      Details   cloBAZam 2.5 mg/mL Suspension  Commonly known as:  ONFI   20 mg, Gastric (NG, OG, PEG, GT), EVERY 8 HOURS (SCHEDULED)  Refills:  0     lacosamide 200 mg Tablet  Commonly known as:  VIMPAT   200 mg, Gastric (NG, OG, PEG, GT), 2 TIMES DAILY  Refills:  0     levETIRAcetam 750 mg Tablet  Commonly known as:  KEPPRA   1,500 mg, Gastric (NG, OG, PEG, GT), 2 TIMES DAILY  Refills:  0        STOP taking these medications.    acetaminophen 325 mg Tablet  Commonly known as:  TYLENOL  Replaced by:  acetaminophen 325 mg/10.15 mL Suspension     amLODIPine 10 mg Tablet  Commonly known as:  NORVASC     apixaban 2.5 mg Tablet  Commonly known as:  ELIQUIS     ascorbic acid (vitamin C) 500 mg Tablet  Commonly known as:  VITAMIN C     aspirin 325 mg Tablet     Diastat AcuDiaL 5-7.5-10 mg Kit  Generic drug:  diazePAM  Replaced by:  diazePAM 5 mg/5 mL Solution     diazePAM 5 mg Tablet  Commonly known as:  VALIUM     fluconazole 150 mg Tablet  Commonly known as:  DIFLUCAN     IPRATROPIUM-ALBUTEROL INHL     JUVEN ORAL     lisinopriL 40 mg Tablet  Commonly known as:  PRINIVIL     metoprolol tartrate 25 mg Tablet  Commonly  known as:  LOPRESSOR     Peridex 0.12 % Mouthwash  Generic drug:  chlorhexidine gluconate          Discharge med list refreshed?  YES     During this hospitalization did the patient have an AMI, PCI/PCTA, STENT or Isolated CABG?  No                            ALLERGIES:  No Known Allergies          HOSPITAL PROCEDURE(S):   Bedside Procedures:  Orders Placed This Encounter   Procedures   . 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 She presented to our facility this time with episodes of tongue biting and clenched jaw that were initially concerning for seizures. Patient was already on multiple of AEDs that were previously started for evidence of clinical and subclinical seizures in the past.    [place]: Patient was initially admitted to the medical ICU for increased seizure-like activity. Patient was found to have Staph epi bacteremia secondary to indwelling catheter, as well as E fecalis UTI for which she received treatment with IV vancomycin. She was seen by neurology while in the ICU and her anti-epileptic medication levels were checked. All her home AEDs were continued, except Dilantin due to elevated levels. Patient was monitored on video EEG that showed near continuous central/parietal periodic 1 Hz discharges with no definite seizures or clinical  correlate during these discharges.  MRI during this admission showed worsening herniation of the left hemisphere  through the craniectomy defect when compared to CT from August 04, 2018. Neurosurgery was consulted and discussed with family about VP shunt placement, however given poor long term prognosis no surgical intervention was pursued at this time. Patient continued to have episodes of tongue biting and jaw clenching, for which neurology was consulted again and recommended repeating Dilantin levels, which were normal on repeat test. Dilantin was resumed, and Diazepam was also started per neurology recommendations. Bite block was used so that patient does not injure her tongue.    Supportive care was consulted family discussion in goals of care clarification. Patient has poor long term prognosis with little improvement in her quality of life. We discussed with patient's daughter [name] (in person) and three other sisters (two on the phone) her current conditions, prognosis, and plans to switch to Comfort Measures. We discussed stopping all medications unrelated to enhancing her comfort, as well as what medications would be started. We discussed discontinuation of oxygen, and treatment of breathing with medications. We discussed transfer from stepdown to floor status, and transition to 7W in case she would be candidate for inpatient hospice. Everyone was in agreement to transition [employer] to comfort measures only. Patient transitioned to [facility] on 01/12/2019. She did not initially qualify for inpatient hospice so care manager was working together with family on finding he facility with hospice care. However, on 01/02/2019 patient experienced an episode of worsening respiratory distress overnight due to an acute thick mucous plug that was obstructing the trach. ENT was consulted and the mucous plug was suctioned and removed at bedside. Patient's breathing has improved. Reached out to supportive care again for GIP evaluation. After she was evaluated by supportive care on 01/09/2019, patient qualified for inpatient  hospice.         TRANSITION/POST DISCHARGE CARE/PENDING TESTS/REFERRALS:   N/A      CONDITION ON DISCHARGE:  A. Ambulation: Bedrest  B. Self-care Ability: Incapable  C. Cognitive Status Limited due to unresponsiveness, does not track, does not follow commands; withdraws extremities from noxious stimuli  D. Code status at discharge: No CPR      LINES/DRAINS/WOUNDS AT DISCHARGE:   Patient Lines/Drains/Airways Status    Active Line / Dialysis Catheter / Dialysis Graft / Drain / Airway / Wound     Name: Placement date: Placement time: Site: Days:    Peripheral IV Ultrasound guided;Extended dwell catheter Left;Lower Arm  01/09/19   0300   6    Gastrostomy Tube Center  07/15/18   --   184    Foley Catheter  01/09/19   0040   6    Tracheostomy 6 Cuffed  07/15/18   1241   183    Wound (Non-Surgical) Perineum;Groin;Thigh  07/19/18   0800   180    Wound (Non-Surgical) Right Buttock;Thigh  01/09/19   0400   6                DISCHARGE DISPOSITION:  Inpatient hospice              DISCHARGE INSTRUCTIONS:       Refer to [facility]   Referral Type: Hospice   Requested Specialty: Hospice Services   Number of Visits Requested: [phone]     Refer to [facility]   Referral Type:  Hospice   Requested Specialty: Hospice Services   Number of Visits Requested: [phone]         Guy Sandifer, MD    Copies sent to Care Team       Relationship Specialty Notifications Start End    Gray Bernhardt, MD PCP - General EXTERNAL  01/13/19     Verified by transtions team on 01/13/19 [phone])    Phone: [phone] Fax: [phone]         [address] [place] [address]    Sigurd Sos, MD PCP - Hospice Attending   12/29/2018     Phone: [phone] Fax: [phone]         [place] [address]            Referring providers can utilize [email or web address] to access their referred [place] patient's  information.

## 2019-01-15 NOTE — Nurses Notes (Signed)
PCA bag needs changed, awaiting delivery from pharmacy. PRN Morphine dose given to keep med in patient's system and will assess again in 30 minutes for continued needs. Patient currently unresponsive to stimuli, unable to verbalize.

## 2019-01-15 NOTE — Nurses Notes (Addendum)
Pt appeared in respiratory distress and cyanotic. Text page sent to [facility] on service. MD to bedside. Respiratory therapist suctioned patient. Respiratory status improved. ENT MD at bedside to ensure correct placement of trach. ENT MD stated pt had a mucous plug. ENT changed whole trach at bedside. Will start pt on Morphine PCA. Appears more comfortable. Family still at bedside. Will continue to monitor.     Leslye Peer, RN  12/25/2018, 03:20

## 2019-01-15 NOTE — Ancillary Notes (Signed)
Identified patient with name and DOB on wristband. She is unresponsive. Agonal breathing with Resp of 6 and use of accessory muscles. Patient has cuffed tracheostomy. Crackles in all lung fields. Heart rate regular, bilateral cyanotic hands with diminished pulses. Generalized 3+ edema. Abd soft absent BS, LBM reported 3 days ago by MPOA who is bedside. Patient has peg tube. Skin is warm, feeling are peeling in chunks. Non-surgical wound to groin and buttocks. Foley draining amber urine.  Left lower arm has 20g extended dwelling catheter with morphine drip infusing. Educated daughter [name] on hospice philosophy, provided emotional support and encouraged her to call with any needs.   Spiritual Care notified.  Dr Hulan Fess notified account is flipped and ready for orders.   [name]  01/03/2019, 17:02

## 2019-01-15 NOTE — Care Plan (Signed)
Plan of care reviewed with family. Pt CMO. ENT exchanged whole trach d\t mucous plug. Many Started on Morphine PCA. Pain managed with meds per MAR. Assessment per doc flow. Family at bedside. Possible d\c to SNF with hospice vs. inpatient hospice. Pt free from falls this shift. Needs met with hourly rounding. Pt resting at this time with call bell in reach. Wheels locked and bed in lowest position. Will continue to monitor.     Problem: Communication Impairment (Mechanical Ventilation, Invasive)  Goal: Effective Communication  Outcome: Ongoing (see interventions/notes)     Problem: Device-Related Complication Risk (Mechanical Ventilation, Invasive)  Goal: Optimal Device Function  Outcome: Ongoing (see interventions/notes)     Problem: Inability to Wean (Mechanical Ventilation, Invasive)  Goal: Mechanical Ventilation Liberation  Outcome: Ongoing (see interventions/notes)     Problem: Nutrition Impairment (Mechanical Ventilation, Invasive)  Goal: Optimal Nutrition Delivery  Outcome: Ongoing (see interventions/notes)     Problem: Skin and Tissue Injury (Mechanical Ventilation, Invasive)  Goal: Absence of Device-Related Skin and Tissue Injury  Outcome: Ongoing (see interventions/notes)     Problem: Ventilator-Induced Lung Injury (Mechanical Ventilation, Invasive)  Goal: Absence of Ventilator-Induced Lung Injury  Outcome: Ongoing (see interventions/notes)     Problem: Skin Injury Risk Increased  Goal: Skin Health and Integrity  Outcome: Ongoing (see interventions/notes)     Problem: Seizure, Active Management  Goal: Absence of Seizure/Seizure-Related Injury  Outcome: Ongoing (see interventions/notes)     Problem: End-of-Life Care  Goal: Comfort, Peace and Preserved Dignity  Outcome: Ongoing (see interventions/notes)     Leslye Peer, RN  01/05/2019, 04:26

## 2019-01-15 NOTE — Care Plan (Signed)
Patient now GIP, currently unresponsive to all stimuli. Respirations have changed throughout shift. Audible coarseness. Frequent tracheal suctioning needed this evening and multiple prn meds given to achieve comfort level acceptable to family. See MAR. Morphine PCA continues at 8mg /hr for comfort. Family to remain at bedside until patient passes, suspect soon due to quick declines and increased needs throughout the day.       Problem: Adult Inpatient Plan of Care  Goal: Plan of Care Review  Outcome: Ongoing (see interventions/notes)  Goal: Patient-Specific Goal (Individualized)  Outcome: Ongoing (see interventions/notes)  Goal: Absence of Hospital-Acquired Illness or Injury  Outcome: Ongoing (see interventions/notes)  Goal: Optimal Comfort and Wellbeing  Outcome: Ongoing (see interventions/notes)  Goal: Rounds/Family Conference  Outcome: Ongoing (see interventions/notes)     Problem: End-of-Life Care  Goal: Comfort, Peace and Preserved Dignity  Outcome: Ongoing (see interventions/notes)

## 2019-01-15 NOTE — Care Management Notes (Signed)
Referral Information  ++++++ Placed Provider #1 ++++++  Case Manager: Lori Taylor  Provider Type: Hospice - Inpatient  Address:  ,    Contact:    Fax:   Fax:

## 2019-01-15 NOTE — H&P (Signed)
[facility] H&P    Date of Service: 01/05/2019  Assessment & Recommendations: Hospice encounter due to failure to thrive in the setting of recent meningioma resection and admitted due to bacteremia and intractable seizures.     -Continue Keppra 1500 mg IV Q 12 hours(for seizures)  -Continue Vimpat 200 IV Q 12 hours(for seizures)  -Continue Clobazam 20 mg PEG Q 8 hours(for seizures)  -Ativan 1 mg IV Q 6 hours (for seizures)  -Ativan 1 mg IV Q 15 minutes PRN (seizures)  -Morphine sulfate to 6mg  /hr (for pain and dyspnea)  -Morphine sulfate to 6 mg IV q 30 minutes prn  (for pain and dyspnea)  -Robinul to 200 mg IV Q 8 hours (for excess secretions)  -Scopolamine patch Q 72 hours (for excess secretion)  -Tylenol 650 mg PR Q 6 hours PRN (fever)    Information Obtained from: daughter and history reviewed via medical record  HPI/Discussion: 58 y/o female with a PMH HTN, dyslipidemia and recent left tentorial meningioma resection on 96/78 complicated with hematoma and repeated OR intervention and PEG/Trach who was admitted on 6/17 due to intractable seizures. staphylococcus bacteremia secondary to indwelling line. The patient didn't improved clinically and was made CMO on 6/21. The pt was admitted to inpatient hospice on 6/24 due to persistent dyspnea and  Delirium.   Encounter Start Date: 01/03/2019  Inpatient Admission DATE:  01/15/19    Active Hospital Problems   (*Primary Problem)    Diagnosis   . Sepsis (CMS [place])     Past Medical History:   Diagnosis Date   . Anxiety    . Arthropathy    . Cancer (CMS HCC)     brain tumor   . CAP (community acquired pneumonia) 01/09/2019   . Dyspnea on exertion    . Esophageal reflux     does not take meds   . Heart murmur     benign   . HTN (hypertension)    . Hx MRSA infection    . Hyperlipemia    . Muscle weakness     right sided weakness   . Palpitations    . Respiratory failure (CMS HCC)    .  Seizure (CMS [place]) 01/09/2019   . Shortness of breath    . Wears glasses     reading         Past Surgical History:   Procedure Laterality Date   . HX APPENDECTOMY     . HX BREAST AUGMENTATION Bilateral    . HX LAP CHOLECYSTECTOMY     . HX LUMBAR DISKECTOMY  1999   . HX OTHER Right     benign tumor removed from upper right leg x3   . HX OTHER      exploratory lap   . HX TUBAL LIGATION     . TRACHEOSTOMY           Past Family History:   Family Medical History:     Problem Relation (Age of Onset)    Ehlers-Danlos syndrome Daughter    Heart Attack Mother    Lymphoma Mother        ROS: Other than ROS in the HPI,  all other systems were negative.    Pt/Family unit dynamics: Pt has a close family  Spiritual Assessment:  No assessed  Advance Directives:  MPOA is the patient daughter Denton Ar  Code Status:  No CPR  Treatment Limitations:  No antibiotics, No bipap, No dialyisis, No hyperalimentation, No IV fluid, No lab studies, No routine diagnositc tests, No supplemental oxygen, No transfer to ICU/Stepdown, No tube feedings, No vasopressors, No x-rays and Do not intubate    Exam:     General: laying supine in bed, in a moderate stress  Eyes: Conjunctiva clear, Pupils equal and round, Sclera non-icteric   HENT: ENT without erythema or injection, mucous membranes moist  Neck: No JVD or thyromegaly or lymphadenopathy  Lungs: decreased air entry  Cardiovascular: tachycardic rate, regular rhythm, no click, no rub   Abdomen: soft, bowel sounds normal and non-distended  Extremities: no cyanosis or edema, grossly intact   Skin: Skin warm and dry, No rashes  Neurologic: lethargic and unable to follow commands.    Decision Making Capacity-  No    Labs: Reviewed    Sigurd Sos, MD

## 2019-01-15 NOTE — Care Management Notes (Signed)
Referral Information  ++++++ Placed Provider #1 ++++++  Case Manager: Lori Taylor  Provider Type: Hospice - Outpatient  Provider Name: [facility]  Address:  [address]  [place], [place] [address]  Contact:  Intake  Phone: [phone] x  Fax:   Fax: [phone]

## 2019-01-15 NOTE — Care Plan (Signed)
[facility]  Spiritual Care Note    Patient Name:  [name]  Date of Encounter:   01/13/2019       12/31/2018 1355   Clinical Encounter Type   Reason for Visit Hospice   Referral From Chaplain-initiated   [place] Visit At This Time No   Visited With Daughter;Other (Comment)   (Daughter Lanelle Bal and ex-husband [place])   Family Spiritual Encounters   Spiritual Assessment Changes related to patient's diagnosis;Grief;Uncertainty   Spiritual/Coping Resources Acceptance of (comment);Beliefs in God/Sacred/Higher Purpose;Prayer/devotional life;Supportive family system  (Acceptance of imminent death and GIP)   End-of-Life Issues Imminent Death   Coping  Relationship building;Provided supportive presence;Offered empathy;Facilitated grief;Facilitated story telling;Explored emotions;Explored/supported faith and Environmental education officer Provided Non-anxious presence   Information Provided Spiritual Care scope of service   Spiritual Care Outcomes with Family   Spiritual/Emotional Processing Spiritual Care relationship established;Family shared/processed patient's story;Family processed emotions   Family Coping No change  (Family continues to grieve appropriately)   Time of Encounters   Start Time 1340   Stop Time 1355   Duration (minutes) 15 Minutes       Other Pertinent Information:  I visited with the family gathered at bedside this afternoon. They were tearful and grieving appropriately. The Patient's daughter spoke in an accepting manner, stating that "her mom can give them love from heaven."  Family is very aware of the Patient's condition and are accepting of her imminent death and GIP. They shared that the Patient's Darrick Meigs faith has always been very important to her, and they are relying on that faith for peace and comfort at this time. I offered support as they shared. No needs were expressed. Before excusing myself, I informed them of the continued availability of chaplains for  support.     Burman Freestone, [facility]  Pager: [phone]  Total Time of Encounter: 20 min.

## 2019-01-15 NOTE — Nurses Notes (Signed)
Pt daughter at bedside called bedside RN in stating "her breathing has changed". Pt's lungs sounded more coarse. PRN Robinul given. Pt starting using abdominal muscles and working harder to breathe, PRN Morphine and Ativan given. No change. Johnney Killian on service paged. Changed q4 hr PRN's to q15 min. If more than 2 doses of PRN's needed then will consider starting on PCA. Daughters at bedside educated on plan/situation. Will continue to monitor.     Leslye Peer, RN  01/09/2019, 02:15

## 2019-01-15 NOTE — Nurses Notes (Signed)
Patient respirations continue to be labored with audible gurgling. Color in face obviously changing frequently when trying to inhale. Patient did have episode of mucous plug obstructing breathing patterns last night and daughter verbalizes similarities currently. Patient has needed multiple prn doses of morphine and tracheal suctioning approximately every 40 minutes x 3. Did discuss with daughters at bedside expectations as patient gets closer to end of life. Family grieving appropriately. Will continue to provide emotional support

## 2019-01-15 NOTE — Procedures (Signed)
[facility] AND NECK SURGERY  PROCEDURE NOTE      Name: [name], 58 y.o. female  MRN: [id number]  DOB: 1960/10/23  Date of Service: 01/07/2019     Procedure: Flexible tracheobronchoscopy    Indications: dyspnea, not moving air well, cyanotic     Description of procedure:  After explaining the procedure to the patient, a flexible bronchooscope was passed into the tracheotomy stoma. Significant mucus plug in distal inner canula and thick mucus in trachea. Inner cannula was removed and thick mucus remained in proximal trachea causing partial airway occlusio. Saline bullets and flexible suction catheter were used to remove mucus plug from ariway.  The flexible endoscope was then reinserted though the trach and the airway down to the segmental bronchi was patent and normal in appearance. The patient tolerated the procedure well without complication.    Lynetta Mare, MD 01/02/2019, 04:09  Department of Otolaryngology - Head and Neck Surgery

## 2019-01-15 NOTE — Consults (Signed)
[facility] Consult  Follow Up Note    [name], [name], 58 y.o. female  Date of Service: 12/30/2018  Date of Birth:  10-23-1960    Hospital Day:  LOS: 6 days     Assessment/Recommendations:  Met with patient's dgt's-[name] (MPOA) and [name] along with [facility], APRN for inpatient hospice evaluation.  Inpatient hospice at [facility] through [facility] discussed and both dgts are in agreement with plan and would prefer she remain at [facility] rather than attempt transport to a nursing. "Freedom of Choice" completed and placed on chart.  Referral completed to [facility] and notified Care Manager of plan.    Perlie Gold, MSW

## 2019-01-15 NOTE — Consults (Signed)
[facility] OF OTOLARYNGOLOGY - HEAD AND NECK SURGERY  CONSULT    Current Date: 12/25/2018  Name: [name], 58 y.o. female  MRN: [id number]  Date of Birth: March 19, 1961  Date of Admission: 01/08/2019    Requesting Service: MEDICINE 5  Reason for Consult: acute respiratory distress, mucus plug.     History of Present Illness: [name] is a 58 y.o. female who was admitted after seizure episode on 01/09/19. 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  During that prolonged admission from 07/05/18-08/07/18 she ended up with trach/peg and was sent to [facility].  According to telephone documentation from as far back as Jan/Feb or 2020 she has been nonresponsive from neurologic standpoint. The patient has since been made CMO.    Early this AM the patient developed acute dyspnea and began turning cyanotic. RT was at bedside attempting to pass trach suction. ENT was stat paged for assistance.     Past Medical History:   Past Medical History:   Diagnosis Date   . Anxiety    . Arthropathy    . Cancer (CMS HCC)     brain tumor   . CAP (community acquired pneumonia) 01/09/2019   . Dyspnea on exertion    . Esophageal reflux     does not take meds   . Heart murmur     benign   . HTN (hypertension)    . Hx MRSA infection    . Hyperlipemia    . Muscle weakness     right sided weakness   . Palpitations    . Respiratory failure (CMS HCC)    . Seizure (CMS [place]) 01/09/2019   . Shortness of breath    . Wears glasses     reading         Past Surgical History:   Past Surgical History:   Procedure Laterality Date   . Hx appendectomy     . Hx breast augmentation Bilateral    . Hx lap cholecystectomy     . Hx lumbar diskectomy  1999   . Hx other Right    . Hx other     . Hx tubal ligation     . Tracheostomy       Medications:   No outpatient medications have been marked as taking for the  01/08/19 encounter [facility] Encounter).        Current Facility-Administered Medications   Medication Dose Route Frequency   . acetaminophen (TYLENOL) 160 mg per 5 mL liquid  650 mg Gastric (NG, OG, PEG, GT) Q4H PRN   . cloBAZam (ONFI) tablet  20 mg Gastric (NG, OG, PEG, GT) Q8H   . diazePAM (VALIUM) 1mg  per mL oral solution  5 mg Gastric (NG, OG, PEG, GT) 2x/day   . famotidine (PEPCID) tablet  20 mg Gastric (NG, OG, PEG, GT) 2x/day   . glycopyrrolate (ROBINUL) 0.2 mg/mL (200 mcg/mL) injection  200 mcg Intravenous Q4H PRN   . lacosamide (VIMPAT) tablet  200 mg Gastric (NG, OG, PEG, GT) 2x/day   . levETIRAcetam (KEPPRA) tablet  1,500 mg Gastric (NG, OG, PEG, GT) 2x/day   . LORazepam (ATIVAN) 2 mg/mL injection  1 mg Intravenous Q15 Min PRN   . morphine 1 mg/mL in NS PCA   Intravenous Continuous   . morphine 2 mg/mL injection  1 mg Intravenous Q15 Min PRN   . NS flush syringe  2 mL Intracatheter Q8HRS    And   . NS flush syringe  2-6 mL Intracatheter Q1 MIN PRN   . phenytoin (DILANTIN) chewable tablet  100 mg Gastric (NG, OG, PEG, GT) Q8HRS   . polyethylene glycol (MIRALAX) oral packet  17 g Gastric (NG, OG, PEG, GT) Daily   . senna concentrate (SENNA) 528mg  per 36mL oral liquid  10 mL Gastric (NG, OG, PEG, GT) 2x/day      Allergies: No Known Allergies  Social History:   Social History     Occupational History   . Not on file   Tobacco Use   . Smoking status: Current Every Day Smoker     Packs/day: 1.00     Years: 30.00     Pack years: 30.00     Types: Cigarettes   . Smokeless tobacco: Never Used   . Tobacco comment: down to 8 cigarettes, counseled on 1-800-QUIT_NOW   Substance and Sexual Activity   . Alcohol use: Never     Frequency: Never   . Drug use: Never   . Sexual activity: Not on file      Family History: No family history of bleeding disorders or anesthesia problems.  Family Medical History:     Problem Relation (Age of Onset)    Ehlers-Danlos syndrome Daughter    Heart Attack Mother    Lymphoma Mother             Review of Systems: Denies fevers or chills. All other systems reviewed and found to be negative or noncontributory to reason for consult.    Physical Exam:  BP (!) 141/74   Pulse (!) 103   Temp 36.8 C (98.2 F)   Resp 16   Wt 76.8 kg (169 lb 5 oz)   SpO2 90%   BMI 26.52 kg/m       General Appearance: pleasant, cooperative, no distress  Eyes: Conjunctiva clear.  Head and Face: Normocephalic/Atraumatic, Facies symmetric, no obvious lesions.  External Ears:normal pinnae shape and position  Nose: external pyramid midline, septum midline,  mucosa normal,  no purulence,  polyps, or crusts   Oral Cavity/Oropharynx: bite block in place.   Neck:: 6 cuffed trach in place. trachea midline  Respiratory: No stridor or stertor. No acute respiratory distress.   Neurologic: grossly normal  Skin: Warm and dry.  Psychiatric:  Cooperative with exam. Oriented to person, place, time and situation.     Labs:    Lab Results   Component Value Date    WBC 6.7 01/12/2019    HGB 8.6 (L) 01/12/2019    HCT 26.5 (L) 01/12/2019    PLTCNT 230 01/12/2019    PMNS 69 01/12/2019       Recent Labs     01/12/19  0425   SODIUM 143   POTASSIUM 3.3*   CHLORIDE 106   BUN 12   CREATININE 0.47*   CALCIUM 9.0   MAGNESIUM 1.8   PHOSPHORUS 2.9      Assessment:   [name] is a 58 y.o. female with chronic severe neurologic deficits who is trach and PEG dependent with acute mucus plug removed at bedside with suction and saline.     Recommendations:  - Mucus plug removed, inner cannula changed  - patient has 6 cuffed Shiley in place  - recommend routine trach care, routine suctioning and saline bullets PRN  - consider decreasing robinul (this will thicken mucus secretions making mucus plugs more likely to develop  - The  trach was placed by trauma surgery in December 2019, we will defer to Trauma surgery for chronic trach management. ENT is available for assistance as needed.   - Please place an order for "Inpatient Consult to  Otolaryngology"    Lynetta Mare, MD 01/01/2019 04:11

## 2019-01-16 ENCOUNTER — Other Ambulatory Visit: Payer: MEDICAID

## 2019-01-16 ENCOUNTER — Non-Acute Institutional Stay: Payer: Self-pay

## 2019-01-16 ENCOUNTER — Other Ambulatory Visit: Payer: Self-pay

## 2019-01-17 ENCOUNTER — Non-Acute Institutional Stay: Payer: Self-pay

## 2019-01-17 ENCOUNTER — Other Ambulatory Visit: Payer: Self-pay

## 2019-01-17 LAB — ADULT ROUTINE BLOOD CULTURE, SET OF 2 BOTTLES (BACTERIA AND YEAST): BLOOD CULTURE, ROUTINE: ABNORMAL — CR

## 2019-01-17 MED ADMIN — lactated Ringers intravenous solution: INTRAVENOUS | @ 08:00:00

## 2019-01-17 MED ADMIN — sodium chloride 0.9 % intravenous solution: INTRAVENOUS | @ 03:00:00 | NDC 00338004904

## 2019-01-18 ENCOUNTER — Non-Acute Institutional Stay: Payer: Self-pay

## 2019-01-18 MED ADMIN — mupirocin 2 % topical ointment: TOPICAL | @ 17:00:00 | NDC 51672131200

## 2019-01-19 ENCOUNTER — Non-Acute Institutional Stay: Payer: Self-pay

## 2019-01-22 NOTE — Expiration Summary (Signed)
EXPIRATION SUMMARY        PATIENT NAME:  [name], [name]   MRN:  [id number]  DOB:  May 09, 1961    ADMISSION DATE:  01/01/2019  EXPIRATION DATE:  12/31/2018      PRIMARY CARE PHYSICIAN: Gray Bernhardt, MD     ADMISSION DIAGNOSIS:  Sepsis (CMS [place]) [A41.9]    FINAL DIAGNOSIS:  Sepsis and intractable seizures    Active Hospital Problems    Diagnosis Date Noted   . Sepsis (CMS [place]) [A41.9] 01/15/2019      Resolved Hospital Problems   No resolved problems to display.     Active Non-Hospital Problems    Diagnosis Date Noted   . 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 PEG (percutaneous endoscopic gastrostomy) status (CMS HCC) 01/10/2019   . Chronic respiratory failure (CMS HCC) 01/10/2019   . Vegetative state (CMS [place]) 01/09/2019   . Seizure (CMS [place]) 01/09/2019   . CAP (community acquired pneumonia) 01/09/2019   . Tobacco use disorder 07/11/2018   . Hypokalemia 07/10/2018   . Encounter for HCV screening test for high risk patient 06/08/2018   . 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 staphylococcus bacteremia secondary to indwelling line. The patient didn't improved clinically and was made CMO on 6/21. The pt was admitted to inpatient hospice on 6/24 due to persistent dyspnea and  Delirium. The patient expired peacefully surrounded by family members the same day of the hospice admission.      cc: Primary Care Physician:  Gray Bernhardt, MD  [address]  [place] [id number]    [id number] Physician:  No referring provider defined for this  encounter.  Sigurd Sos, MD

## 2019-01-22 DEATH — deceased

## 2019-01-23 ENCOUNTER — Non-Acute Institutional Stay: Payer: Self-pay

## 2019-01-23 NOTE — Nursing Note (Signed)
Bereavement:  Called daughter, she was coping well. Reviewed bereavement services. No needs voiced at this time.

## 2019-02-04 ENCOUNTER — Encounter (INDEPENDENT_AMBULATORY_CARE_PROVIDER_SITE_OTHER): Payer: Self-pay | Admitting: Anesthesiology

## 2019-02-04 ENCOUNTER — Other Ambulatory Visit (HOSPITAL_COMMUNITY): Payer: Self-pay

## 2019-03-17 MED ADMIN — oxyCODONE-acetaminophen 5 mg-325 mg tablet: @ 04:00:00

## 2019-03-17 MED ADMIN — VANCOMYCIN 1 G IN NS IRRIGATION: @ 08:00:00 | NDC 67457034001

## 2019-03-17 MED ADMIN — sodium chloride 0.9 % intravenous solution: INTRAVENOUS | @ 17:00:00

## 2019-03-18 MED ADMIN — sodium chloride 0.9 % intravenous solution: INTRAVENOUS | @ 15:00:00 | NDC 00338004904

## 2019-03-18 MED ADMIN — lactated Ringers intravenous solution: @ 12:00:00

## 2019-03-18 MED ADMIN — lactated Ringers intravenous solution: INTRAVENOUS | @ 09:00:00 | NDC 00338011704

## 2019-03-18 MED ADMIN — sodium chloride 0.9 % (flush) injection syringe: @ 08:00:00

## 2019-03-18 MED ADMIN — lactated Ringers intravenous solution: INTRAVENOUS | @ 11:00:00

## 2019-03-19 MED ADMIN — nicotine 21 mg/24 hr daily transdermal patch: TRANSDERMAL | @ 09:00:00

## 2019-03-19 MED ADMIN — nystatin 100,000 unit/gram topical powder: TOPICAL | @ 15:00:00 | NDC 00832046515

## 2019-03-20 MED ADMIN — lactated Ringers intravenous solution: INTRAVENOUS | @ 10:00:00

## 2019-03-23 MED ADMIN — sodium chloride 0.9 % (flush) injection syringe: @ 22:00:00

## 2019-03-24 MED ADMIN — aspirin 81 mg chewable tablet: @ 09:00:00

## 2019-03-24 MED ADMIN — lidocaine 4 %-menthoL 1 % topical patch: TRANSDERMAL

## 2019-03-25 MED ADMIN — sodium chloride 0.9 % (flush) injection syringe: @ 14:00:00

## 2019-03-26 MED ADMIN — amiodarone 200 mg tablet: @ 21:00:00

## 2019-04-29 ENCOUNTER — Encounter (INDEPENDENT_AMBULATORY_CARE_PROVIDER_SITE_OTHER): Payer: Self-pay | Admitting: Student in an Organized Health Care Education/Training Program
